# Patient Record
Sex: Male | Born: 1965
Health system: Southern US, Community
[De-identification: ages and names within clinical notes are randomized; demographics above are authoritative.]

## PROBLEM LIST (undated history)

## (undated) DIAGNOSIS — R0781 Pleurodynia: Secondary | ICD-10-CM

## (undated) DIAGNOSIS — IMO0002 Reserved for concepts with insufficient information to code with codable children: Secondary | ICD-10-CM

## (undated) DIAGNOSIS — K219 Gastro-esophageal reflux disease without esophagitis: Secondary | ICD-10-CM

## (undated) DIAGNOSIS — I82609 Acute embolism and thrombosis of unspecified veins of unspecified upper extremity: Secondary | ICD-10-CM

## (undated) DIAGNOSIS — I2510 Atherosclerotic heart disease of native coronary artery without angina pectoris: Secondary | ICD-10-CM

## (undated) DIAGNOSIS — G540 Brachial plexus disorders: Secondary | ICD-10-CM

## (undated) DIAGNOSIS — I82409 Acute embolism and thrombosis of unspecified deep veins of unspecified lower extremity: Secondary | ICD-10-CM

## (undated) HISTORY — PX: KNEE CARTILAGE SURGERY: SHX688

## (undated) HISTORY — PX: CAROTID ANGIOGRAM: SHX5765

## (undated) HISTORY — DX: Brachial plexus disorders: G54.0

## (undated) HISTORY — DX: Acute embolism and thrombosis of unspecified deep veins of unspecified lower extremity: I82.409

## (undated) HISTORY — DX: Reserved for concepts with insufficient information to code with codable children: IMO0002

## (undated) HISTORY — DX: Acute embolism and thrombosis of unspecified veins of unspecified upper extremity: I82.609

## (undated) HISTORY — DX: Gastro-esophageal reflux disease without esophagitis: K21.9

---

## 2004-06-12 ENCOUNTER — Emergency Department: Payer: Self-pay | Admitting: Internal Medicine

## 2004-09-01 ENCOUNTER — Emergency Department: Payer: Self-pay | Admitting: Emergency Medicine

## 2006-01-30 ENCOUNTER — Emergency Department: Payer: Self-pay | Admitting: Emergency Medicine

## 2006-12-03 ENCOUNTER — Emergency Department (HOSPITAL_COMMUNITY): Admission: EM | Admit: 2006-12-03 | Discharge: 2006-12-03 | Payer: Self-pay | Admitting: Emergency Medicine

## 2008-02-28 ENCOUNTER — Encounter
Admission: RE | Admit: 2008-02-28 | Discharge: 2008-02-29 | Payer: Self-pay | Admitting: Physical Medicine & Rehabilitation

## 2008-02-29 ENCOUNTER — Ambulatory Visit: Payer: Self-pay | Admitting: Physical Medicine & Rehabilitation

## 2008-07-01 ENCOUNTER — Encounter
Admission: RE | Admit: 2008-07-01 | Discharge: 2008-09-29 | Payer: Self-pay | Admitting: Physical Medicine & Rehabilitation

## 2008-07-02 ENCOUNTER — Ambulatory Visit: Payer: Self-pay | Admitting: Physical Medicine & Rehabilitation

## 2008-07-22 ENCOUNTER — Ambulatory Visit: Payer: Self-pay | Admitting: Physical Medicine & Rehabilitation

## 2008-07-31 ENCOUNTER — Ambulatory Visit: Payer: Self-pay | Admitting: Surgery

## 2008-08-07 ENCOUNTER — Ambulatory Visit: Payer: Self-pay | Admitting: Surgery

## 2008-08-15 ENCOUNTER — Ambulatory Visit: Payer: Self-pay | Admitting: Physical Medicine & Rehabilitation

## 2008-09-12 ENCOUNTER — Ambulatory Visit: Payer: Self-pay | Admitting: Physical Medicine & Rehabilitation

## 2008-10-03 ENCOUNTER — Encounter
Admission: RE | Admit: 2008-10-03 | Discharge: 2009-01-01 | Payer: Self-pay | Admitting: Physical Medicine & Rehabilitation

## 2008-10-07 ENCOUNTER — Ambulatory Visit: Payer: Self-pay | Admitting: Physical Medicine & Rehabilitation

## 2008-10-18 ENCOUNTER — Emergency Department: Payer: Self-pay | Admitting: Emergency Medicine

## 2008-10-22 ENCOUNTER — Encounter: Payer: Self-pay | Admitting: Physical Medicine & Rehabilitation

## 2008-10-25 ENCOUNTER — Emergency Department: Payer: Self-pay | Admitting: Emergency Medicine

## 2008-10-29 ENCOUNTER — Ambulatory Visit: Payer: Self-pay | Admitting: Physical Medicine & Rehabilitation

## 2008-10-30 ENCOUNTER — Ambulatory Visit: Payer: Self-pay | Admitting: Physical Medicine & Rehabilitation

## 2008-11-05 ENCOUNTER — Encounter: Payer: Self-pay | Admitting: Physical Medicine & Rehabilitation

## 2008-12-05 ENCOUNTER — Ambulatory Visit: Payer: Self-pay | Admitting: Physical Medicine & Rehabilitation

## 2008-12-12 ENCOUNTER — Encounter: Payer: Self-pay | Admitting: Physical Medicine & Rehabilitation

## 2008-12-16 ENCOUNTER — Ambulatory Visit: Payer: Self-pay | Admitting: Physical Medicine & Rehabilitation

## 2010-05-09 ENCOUNTER — Emergency Department: Payer: Self-pay | Admitting: Emergency Medicine

## 2010-06-07 HISTORY — PX: SCALENOTOMY W/O RESECTION CERVICAL RIB: SUR1288

## 2010-06-07 HISTORY — PX: OTHER SURGICAL HISTORY: SHX169

## 2010-08-31 ENCOUNTER — Ambulatory Visit: Payer: Self-pay | Admitting: Vascular Surgery

## 2010-10-20 NOTE — Procedures (Signed)
NAMECYNTHIA, STAINBACK             ACCOUNT NO.:  [id number]   MEDICAL RECORD NO.:  [id number]           PATIENT TYPE:   LOCATION:                                 FACILITY:   PHYSICIAN:  Erick Colace, M.D.DATE OF BIRTH:  07-Jul-1965   DATE OF PROCEDURE:  DATE OF DISCHARGE:                               OPERATIVE REPORT   PROCEDURE:  Left sacroiliac injection under fluoroscopic guidance.   INDICATIONS:  Left-sided buttock and low back pain.  Oswestry  questionnaire score is 66%.   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   Informed consent was obtained after describing risks and benefits of the  procedure with the patient.  These include bleeding, bruising, and  infection.  He is not on any antibiotics or any anticoagulants at the  current time.   The patient was placed prone on the fluoroscopy table.  Betadine prep,  sterile drape.  A 25-gauge 1-1/2-inch needle was used to anesthetize the  skin and subcutaneous tissue with 1% lidocaine x2 mL and a 25-gauge, 3-  inch spinal needle was inserted under fluoroscopic guidance, targeting  the left SI joint.  AP, lateral, and oblique images were utilized.  Omnipaque 180 under live fluoro demonstrated no intravascular uptake  with joint outline and inferior margin followed by injection of 1 mL of  2% MPF lidocaine and 1.5 mL of 40 mg/mL Depo-Medrol.  The patient  tolerated the procedure well.  Pre- and post-injection vitals were  stable.  Post-injection instructions were given.  Pre- and post-  injection pain level is stable.  We will send him up for repeat  injection in 2-3 weeks facet versus SI.      Erick Colace, M.D.  Electronically Signed     AEK/MEDQ  D:  07/22/2008 09:03:22  T:  07/22/2008 21:37:06  Job:  [id number]   cc:   Sharin Mons

## 2010-10-20 NOTE — Assessment & Plan Note (Signed)
A 45 year old male with date of work injury August 08, 2007.   The patient was initially referred by Dr. Sheran Luz and I saw him  initially on February 29, 2008.  Initial impression was sacroiliac  dysfunction and/or facet disorder.  He has undergone both sacroiliac  injection under fluoroscopic guidance as well as media-branch blocks to  block the lower facet joints in the left which were similarly unhelpful,  done on August 15, 2008.  He has been on sedentary work restriction.  We  gradually got up to 4 half days per week, and when I saw last visit I  did increase his tramadol but also increased him to 5 half days a week.  He states that since this occurred he does not have that extra day of  rest and his pain seems to be worse.  He is here today saying that his  pain is worse, and he is unable to work at the schedule that has been  prescribed.   He has had no new traumatic injuries.  He denies any falls.  He has had  no fevers or chills.   CURRENT MEDICATIONS:  Tramadol 2 p.o. t.i.d.   PHYSICAL EXAMINATION:  He is lying on exam the table, tends to not lie  on his left side.  He has negative straight leg raising test.  His  sensation is intact to light touch in bilateral lower extremities.  Deep  tendon reflexes are normal.  Knee and ankle show full range of motion  without pain or swelling.  Hip range of motion is painful with internal  and external rotation, mainly in the buttocks area rather than in the  groin or in the lateral hip area.  He has some mild tenderness to  palpation in the lateral hip area.  There is no evidence of joint  instability.  He ambulates with a cane favoring the left hip.   IMPRESSION:  Left buttocks pain.  Workup has been negative thus far.  He  has had an EMG which was negative for nerve compression.  He has had  negative response to facet medial-branch blocks and sacroiliac  injection.  He has not had any improvement after transforaminal epidural  injection done by Dr. Ethelene Hal.  He just had a shallow left posterolateral  disk on the left side, not causing any nerve root compression.   He had a bone scan on December 27, 2007, which was negative.  He has not had  any MRI of his hip.  I think really to complete the workup this would be  necessary to rule out AVN as a potential pain generator.   Since he was tolerating 4 half days per week, we will cut him back to  that while workup is pending.  If this imaging study is negative, we  would continue to recommend a multidisciplinary pain functional  restoration program and go back to 5x1/2 days progressing from there.   I discussed with the patient and his wife in agreement to continue his  current medications and give him Toradol injection today.  He is  requesting something for his pain.      Erick Colace, M.D.  Electronically Signed     AEK/MedQ  D:  10/07/2008 10:03:39  T:  10/07/2008 22:49:52  Job #:  [id number]   cc:   Sharin Mons, MD

## 2010-10-20 NOTE — Group Therapy Note (Signed)
ADDENDUM:  The patient's recommended work restrictions are sedentary job and 10-  pound lifting.  At this point, I think, 2-3 days per week with half days  would be at least a way to get him start at back in work.  I do not  think this would cause further damage to the spine.  He would benefit  from additional activity.  Discussed all the above findings with the  patient and case manager, Ms. Stoggett.   The patient is not MMI at the current time and I do not think the  medications prescribed would impair his ability or adjustment needed to  perform his job duty.   I have not scheduled a next office visit at the current time given that  this is a second opinion consultation and would not do so unless we  assume care.      Erick Colace, M.D.  Electronically Signed     AEK/MedQ  D:  02/29/2008 16:52:41  T:  03/01/2008 05:21:10  Job #:  [id number]

## 2010-10-20 NOTE — Assessment & Plan Note (Signed)
Justin [name] is a 45 year old male with a date of work injury August 08, 2007, claim #[id number].   The patient was initially referred by Dr. Sheran Luz.  I saw the  patient originally in September 2009.  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  He  felt a pop in the left side of his low back, and had pain since that  time.  [facility], MRI showed a shallow left  posterolateral disk at L4-5, not causing any foraminal stenosis or nerve  root compression.  The patient was evaluated by Dr. Ethelene Hal and then  evaluated by Dr. Shelle Iron from Orthopedic Surgery.  He went through  physical therapy recommendations for sedentary work duty.  EMG testing  per Dr. Clarisse Gouge. was normal in the lower extremities.  Bone scan ordered  on December 27, 2007, was normal.  Recommendations from Dr. Ethelene Hal'  [facility] as well as a neurosurgical second opinion.   Since he began treatment here, we continued his sedentary work  restrictions, but gradually gone up on his days, 4 half days a week.  He  has undergone left SI joint injection under fluoroscopic guidance which  had no impact on his pain.  He then underwent L5 dorsal ramus injection,  L4 medial branch, and L3 medial branch block which was also unhelpful  and it was performed on August 15, 2008.   His pain scores are still at a 7-8/10 level which is similar to his  baseline.  Sleep is poor.  Pain improves with rest and medication,  relieves with meds about 3/10.  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   REVIEW OF SYSTEMS:  Indicates he needs some assist with certain dressing  and bathing activities.  His review of systems is positive for tingling,  trouble walking, spasms, and tingling in his hip  area.   His blood pressure is 125/64, pulse 87, respirations 18, and O2 sat 98%  room air.  This is an obese male in no acute distress.  Orientation x3.  Affect is alert.  Gait is with a limp, uses a cane, favors left hip, he  has good range of motion in the left hip.  He has normal strength in  bilateral lower extremities.  Deep tendon reflexes are normal.  Muscle  bulk was normal.  He has tenderness over left PSIS area.  Sensation is  intact in the lower extremities.   IMPRESSION:  Left buttock pain, I do not think it is diskogenic, SI  mediated, or facet mediated.  This could be a chronic gluteal strain.  Certainly, his degree of disability is out of proportion to the injury.  I think he could benefit from a functional restoration or work  conditioning program to at least get him back to full days.  I think at  this point he can go one-half day a week.  I think at this point we will  continue his sedentary restrictions.  He is doing okay at 32 hours a  week, would recommend a multidisciplinary pain program given that his  seated posture is always his right buttocks, does not sit up straight,  seems to be functioning at a suboptimal level.  I really do need to see him anymore.  I did write an increased dose of  tramadol 2 p.o. t.i.d., although that the 50 t.i.d. dose did not seem to  be having much effect.  We will see how this does.  My impression is  that probably no  medications will be very helpful for this given that this is more likely  to be a myofascial type situation, I discussed with the patient.      Erick Colace, M.D.  Electronically Signed     AEK/MedQ  D:  09/12/2008 15:07:56  T:  09/13/2008 02:40:22  Job #:  [id number]   cc:   Sharin Mons  fax [phone]

## 2010-10-20 NOTE — Procedures (Signed)
NAMEHANIF, RADIN             ACCOUNT NO.:  [id number]   MEDICAL RECORD NO.:  [id number]           PATIENT TYPE:   LOCATION:                                 FACILITY:   PHYSICIAN:  Erick Colace, M.D.DATE OF BIRTH:  1965/11/15   DATE OF PROCEDURE:  DATE OF DISCHARGE:                               OPERATIVE REPORT   PROCEDURE:  Left L5 dorsal ramus injection, left L4 medial branch block,  and left L3 medial branch block under fluoroscopic guidance.   INDICATIONS:  Axial low back pain radiating to the left hip.  No  significant relief after SI joint injection.  Pain is only partially  relieved by medication management and other conservative care and  interferes with mobility.   Informed consent was obtained after describing risks and benefits of the  procedure with the patient.  These include bleeding, bruising,  infection.  He elects proceed and has given written consent.  The  patient placed prone on fluoroscopy table, Betadine prep, sterilely  draped, 25-gauge, 1-1/2-inch needle was used to anesthetize the skin and  subcu tissue 1% lidocaine x2 mL, and a 22-gauge, 3-1/2-inch spinal  needle was inserted under fluoroscopic guidance targeting left S1 SAP  sacral ala junction bone contact made, confirmed with lateral imaging.  Omnipaque 180 under live fluoro demonstrated no intravascular uptake and  0.5 mL of the solution containing 1 mL of  4 mg/mL dexamethasone and 2  mL of 2% MPF lidocaine.  Left L5 SAP transverse process junction  targeted bone contact made, confirmed with lateral imaging.  Omnipaque  180 x0.5 mL demonstrated no intravascular uptake then 0.5 mL of  dexamethasone lidocaine solution was injected.  Left L4 SAP transverse  process junction targeted bone contact made, confirmed with lateral  imaging.  Omnipaque 180 x0.5 mL demonstrated no intravascular uptake  then 0.5 mL dexamethasone lidocaine solution was injected.  The patient  tolerated procedure well.   Pre and post injection vitals stable.  Post  injection instructions given.      Erick Colace, M.D.  Electronically Signed     AEK/MEDQ  D:  08/15/2008 10:43:09  T:  08/15/2008 22:52:45  Job:  [id number]

## 2010-10-20 NOTE — Assessment & Plan Note (Signed)
Problem #[id number]   ADDENDUM   1. 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  2. Work status:  Continue sedentary work with one-half day 3 times a      week until I see him next.   The patient is not at MMI.   Office visit to be determined.  We will need to Workers' Comp approval  for sacroiliac injection on the left side.      Erick Colace, M.D.  Electronically Signed     AEK/MedQ  D:  07/02/2008 10:06:15  T:  07/03/2008 01:11:05  Job #:  [id number]   cc:   Clare Charon, M.D.  Fax: [phone]

## 2010-10-20 NOTE — Assessment & Plan Note (Signed)
Justin [name] returns today.  A 45 year old male with date of injury on  August 08, 2007, reports stretching injury, slipping of a forklift.  He  was last seen by me on December 06, 2008, at which time the goal is to can  back to work, half day a week, 5 days a week, and work up to full time.  He was sent for a functional capacity evaluation, which was performed on  December 12, 2008, and he was just not to be able to perform sedentary duty.  The patient was felt to be within normal limits in terms of self-  limiting behavior,reported 10/10 pain.  Poor body mechanics were noted.  Avoidance Beliefs Questionnaire 24/30 this indicates a fear of movement.  Oswestry 88%, which is consistent once we have had in this clinic.  This  is inconsistent with the pts' ability to get to work or to an MD  visit.He is able to occasionally sit to stand and walk, but really  nothing else.  This was not a very helpful FCE.   He continues on tramadol 50 mg 2 p.o. t.i.d.   He is currently working 4 hours a day, 4 days per week.  He is reporting  pain at 8/10.  Currently, has some numbness and tingling in side of  which has already been evaluated both with MRIs and with EMGs, which  have been normal.  Trouble walking spasms, depression, and  constipation.Marland Kitchen   PHYSICAL EXAMINATION:  VITAL SIGNS:  Blood pressure 133/74, pulse 84,  respirations 18, and O2 sat 98% on room air.  GENERAL:  Overweight male who sits on his right side.  EXTREMITIES:  He has normal strength in lower extremities.  Pain with  hip range of motion.  Also, tenderness over the gluteus muscle.  He has  mild tenderness over the greater trochanter on the left side and the  right side.  His extremities shows no evidence of edema.  Good skin  temperature.  No erythema.  Gait is forward flexed, used a cane.  No  foot drop.   IMPRESSION:  Chronic left lower extremity pain.  Negative workup,  basically appears to be a bursitis with some myofascial pain in the  gluteus medius area.  His pain does appear to be out of proportion to  underlying pathology.  He has been seen by psychology.  No cognitive  deficits seen, but pain psychology was recommended.  His true functional  status is difficult to quantify.  I have offered 2 options for the  patient; either a increasing pain medication and increasing his work  activity to see if this can improve his tolerance.  Will be a referral  to a multidisciplinary pain program, which I have recommended in the  past as well as has been recommended by Dr. Ethelene Hal.  He had a Physical  Therapy program approved by workers' comp, but nothing truly integrated.  Separately, he was seen by Psychology, once again this is not a  integrated program.   I really have nothing else to offer at this point other than continue  tramadol and continue him on a work restriction.  It is certainly  difficult to tell what is an exaggerated fear avoidance patterns versus  malingering, but once again feel that a multidisciplinary program would  be best at this point to help differentiate between these two issues.   I recommend a 1/2 day, 5 day per week schedule. I am concerned that the  pt may  non-compliant with this recommendation given he states he needs  extra rest.  Given the lack of pathology seen on diagnostic testing, I  don't think this should be a problem.  I will see pt only on a prn basis given I have nothing more to offer.     Justin [name], M.D.  Electronically Signed    AEK/MedQ  D:  12/16/2008 17:37:55  T:  12/17/2008 05:22:57  Job #:  [id number]   cc:   Nurse Case Manager Sharin Mons

## 2010-10-20 NOTE — Assessment & Plan Note (Signed)
This 45 year old male who had a date of injury on August 08, 2007, when he  reports falling from a forklift.  He slipped off tire and landed on the  ground, causing him to do a split.  He fell off on the left side low  back.  He has had pain since that time.  No numbness in the lower  extremities.  At first, he says that he may be having some numbness on  the inside of his thigh more currently.  He had an MRI ordered from the  [facility], lumbar spine without contrast on August 31, 2007,  shallow left posterior lateral disk without any foraminal stenosis or  nerve root impingement.  He has been in a forward-flexed posture since a  note that I reviewed from Dr. Ethelene Hal on September 18, 2007.  He has been seen  by Dr. Jillyn Hidden from Orthopedic Surgery and was recommended to have work a  sedentary duty.  EMG of left lower extremity, which was performed by Dr.  Clarisse Gouge on November 24, 2007, which was normal.  He has had a couple Medrol  Dosepak initiated as well as Designer, multimedia.  A multidisciplinary pain program  was recommended.  Bone scan was performed on December 27, 2007, which was  normal.  I saw him in a second opinion consultation on March 01, 2008.  My only addition in terms of his treatment program was trial of a  sacroiliac injection and facet injection if that failed as well as  agreeing with a reconditioning functional restoration program.  A  sacroiliac injection was performed July 22, 2008, which did not help  at all of his pain.  He was increased to four half days per week since  February of 2010.  He had medial branch blocks performed on August 15, 2008, once again no significant improvement.  We increased this to half  a day per week, 5 days a week for September 12, 2008 and at that point, he  felt like he was doing worse when he got back to five half days per  week.  I made a referral from multidisciplinary pain program at that  time because I really thought he was at a maximum medical  improvement  from what I can do for him and had no further improvement in functional  status may be gained through a functional restoration program.  He came  back to me stating that he was feeling worse again on Oct 07, 2008.  He  had no new trauma.  No new injuries.  He had no change in his  examination on Oct 07, 2008.   I reexamined him, no new problems were noted.  We did order MRI of his  hip to complete his workup, looking at maybe another potential pain  generator; although whether or not this would be a direct causation from  his work, difficult say.  However, he has had some steroids during the  course of the treatment.   He indicates his pain is 9 before.  Pain improves with rest, heat,  medications; relief from meds is little.  He walks 1-2 minutes at a time  he does not climb steps, he does not drive, he does sedentary type work,  he indicates some help needed for meal prep, household duties, shopping,  as well as some with dressing.   REVIEW OF SYSTEMS:  As per health and history form.   PHYSICAL EXAMINATION:  GENERAL:  Overweight male, in  no acute distress.  Orientation x3.  Affect alert.  VITAL SIGNS:  Blood pressure 133/77, pulse 100, respirations 18, O2 sat  97% room air.  EXTREMITIES:  Without edema.  His hip has some buttock pain with minimal  groin pain with internal external rotation of the hip.  He has no  evidence of joint instability.  He has no knee or ankle range of motion  deficits.  His lower extremity sensation is normal. Deep tendon reflexes  are normal in bilateral lower extremities.  His tenderness over the  buttocks and PSIS area on the left side only.   IMPRESSION:  Left buttocks pain, workup negative thus far.  He was not  able to tolerate MRI without sedation.  I wrote for some Valium.  At  this point, given the workup thus far, would not proceed on to a IV  sedation, if cannot do this with Valium.  I do think, he could continue  at four half  days a week at a sedentary work.  I do not think, the work  is making him any worse.  The patient is concerned that he has flare ups  of his pain.  He feels like he needs to be seen immediately for these.  He went to the [facility] last week.  X-rays negative for fracture in  the left hip and the back area, received some Percocet and Valium.   I do think, a functional restoration program will be helpful for him.  His diagnosis of this point is basically lumbago and unspecified hip  pain with a myofascial component to his pain.  He asked about using a  walker.  I really did not think this would really be indicated based on  diagnosis.   He asked whether he can be seen again at [facility]  and I have sent him back there to see if they would take over if he  wishes to transfers care there.  I will send him Dr. Ethelene Hal, Dr. Shelle Iron or  Shon Baton.  He may benefit from evaluation by Dr. Charlann Boxer or Alusio in regards  to his hip.      Erick Colace, M.D.  Electronically Signed     AEK/MedQ  D:  10/29/2008 16:34:32  T:  10/30/2008 09:54:28  Job #:  [id number]   cc:   Dr. Benny Lennert

## 2010-10-20 NOTE — Assessment & Plan Note (Signed)
A 45 year old male, day of injury August 08, 2007, reports falling from a  forklift, slipped off a trailer and landed on the ground causing him to  do the splits.  He has had left-sided low back and buttock pain since  that time.  He has had extensive workup as well as physician evaluation.  Lumbar spine MRI showed a shallow left posterolateral disk without  foraminal stenosis.  Spine Surgery evaluated no surgical lesion.  EMG of  left lower extremity performed by Dr. Clarisse Gouge on November 24, 2007, was  normal.  He has had bone scan on January 01, 2008, and normal.  I saw him  in a second opinion consultation after Dr. Ethelene Hal recommended a  multidisciplinary pain program.  I trialed him on sacroiliac injection  on July 22, 2008, which did not help at all.  He had medial branch  blocks performed on August 15, 2008, which did not help at all.  We  increased his worked to half day 5 days a week.  He had some worsening.  I recommended a multidisciplinary pain program, workers' comp, improved  physical therapy, and psychology separately.  He still has not had a  psychology eval but had to finish with physical therapy without  significant improvement.  I last saw him on Oct 29, 2008, ordered MRI of  his hip to complete his workup and this was normal only showing some  minimal degenerative changes not accounting for his pain.   He returns today for followup.   CURRENT MEDICATIONS:  Tramadol 50 mg 2 p.o. t.i.d.   PHYSICAL EXAMINATION:  GENERAL:  Overweight male in no acute distress.  Sits on his right buttock.  He has mild decreased hip internal and  external rotation on the right side.  No evidence of joint instability.  No knee or ankle range of motion deficits.  Straight leg raising test is  negative.  Deep tendon reflexes normal in bilateral extremities.  He has  tenderness over the buttocks and PSIS area on the left side and no  tenderness over the trochanteric bursa.  He has normal sensation in  lower extremity.   IMPRESSION:  Left buttocks pain.  Negative workup.  I think this is mild  myofascial in nature.  No new recommendations at this time.  I think he  should be able to get to work light duty half day a week 5 days a week  and workup to full.  We would like to get a functional capacity  evaluation to further delineate his permanent restrictions.  I will see  him back after the functional capacity evaluation.      Erick Colace, M.D.  Electronically Signed     AEK/MedQ  D:  12/05/2008 15:24:51  T:  12/06/2008 05:13:45  Job #:  [id number]

## 2010-10-20 NOTE — Assessment & Plan Note (Signed)
WORK INJURY:  August 08, 2007.   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  He has seen physicians at the  [facility].  MRI of the lumbar spine, left shallow posterior  lateral disk without foraminal stenosis, noted to have forward flexed  posture when seen by Dr. Ethelene Hal from [facility], Podiatry,  and then orthopedic surgical eval with Dr. Shelle Iron.  No surgical treatment  recommended.  Placed on Ultram as well as anti-inflammatories.  Injection options of epidural versus facet injections.  Sedentary due to  recommendations.  EMG left lower extremity per Dr. Clarisse Gouge, normal.  Second opinion, Neurosurgery, Dr. Marikay Alar.  No surgical treatment  recommended.  When I saw him last in September, overall opinion was not  disk related pain, sacroiliac versus facet with overlying myofascial  component.   In the interval time period, has undergone some treatment for  hemorrhoids due to constipation, had been on some Darvocet.  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   Pain score right now is 7-8/10 and interferes with activity at 10/10  level.  Sleep is poor.  Pain is increased with walking, bending,  standing.  On the positive side, he has increased his part-time work to  3 half days per week.  He needs some assist with meal prep, household  duties, and shopping.   His review of systems is positive for tingling, spasms, dizziness,  depression, constipation.   SOCIAL HISTORY:  Married.   PHYSICAL EXAMINATION:  VITAL SIGNS:  Blood pressure 135/66, pulse 66,  respirations 18, O2 sat 95% on room air.  GENERAL:  No acute stress.  Orientation x3.  Affect alert.  EXTREMITIES:  Gait is with a limp.   He is forward flexed at the hips.  He has tight hamstrings.  He has mild  pain over the  PSIS, but also pain in the lumbar paraspinal area on the  left side.  His sensory exam is normal in lower extremities.  Deep  tendon reflexes are normal.  Strength is normal.  Hip range of motion is  normal.  Knee and ankle range of motion are normal.  He is unable to  straighten out due to his back pain.   IMPRESSION:  1. Lumbar pain, work-related injury.  He would benefit from additional      investigation of pain generators including sacroiliac versus which      are with sacroiliac given his overall pain distribution and      exacerbating factors.  2. Continue current work restrictions of one-half day per week, 3      times per week, next month go up to 4 days a week.  Once we have      better idea in terms of pain generator, we would recommend another      course of physical therapy.  3. If we fail to make a more specific diagnosis in terms of pain      generator and we are unable to advance his activity any further, he      would benefit from multidisciplinary pain treatment program.   I have written for tramadol 50 mg t.i.d. today.  He is to stop his  propoxyphene.  Discussed with the patient and wife.  Case manager not  present today.      Erick Colace, M.D.  Electronically Signed     AEK/MedQ  D:  07/02/2008 10:03:13  T:  07/02/2008 23:29:52  Job #:  [id number]   cc:   Caralyn Guile. Ethelene Hal, M.D.  Fax: [phone]   Sharin Mons

## 2010-10-20 NOTE — Group Therapy Note (Signed)
This is a second opinion consultation.   CONSULT REQUESTED BY:  Dr. Ethelene Hal for the evaluation of low back pain.   CHIEF COMPLAINT:  Back pain, left hip pain, and spasms into the left  buttock and posterior thigh.   Onset of pain on August 08, 2007.  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  He parked the  vehicle and stepped out onto the tire with his right leg, which then  slipped off the tire and landed on the ground causing him to do split.  He felt the pop in the left side of his low back and has had pain since  that time.  He does not have any numbness in the lower extremity.  He  gets spasms in the left-sided low back and buttock.  He initially saw  the nurse at his place of employment but later seen at [facility].  One of the physicians at [facility] ordered an MRI of the lumbar  spine without contrast, which was performed on August 31, 2007.  I was  able to look at the films personally as well as review the MRI report  read by radiologist Frederica Kuster.  I agreed with the impression of  her shallow left posterolateral disk protrusion at L4-L5, which did not  cause any foraminal stenosis or any nerve root compression.  He then was  sent to Dr. Ethelene Hal and I reviewed this note from September 18, 2007.  The  patient at that point was in a forward-flexed position with tight  hamstrings but good hip range of motion, normal deep tendon reflexes.  He was referred to Orthopedic Surgery for surgical evaluation on October 03, 2007, with Dr. Jene Every.  Recommendation was for possible  epidural steroid injection versus facet injections and Ultram as well as  antiinflammatory.  He had another visit with Dr. Ethelene Hal on November 09, 2007.  Treatment options were discussed.  The patient elected not to proceed  with any type of injections.  He did continue with physical therapy and  recommendations for work sedentary duty as well as the EMG left  lower  extremity.  He returned to see Dr. Ethelene Hal on November 14, 2007, second opinion  mentioned at that time.  The patient also underwent EMG testing per Dr.  Clarisse Gouge on November 24, 2007.  There was a normal lower extremity  electrodiagnostic evaluation.  Another note from December 26, 2007, per Dr.  Ethelene Hal' Medrol Dosepak initiated, Darvocet initiated.  Second opinion,  neurosurgery Dr. Yetta Barre and if neurosurgery recommended multidisciplinary  pain clinic, the patient taken out of work from December 15, 2007, to January 01, 2008.   Bone scan ordered on December 27, 2007, performed at [facility],  normal study.  Note from Dr. Venora Maples January 02, 2008, once again  recommending neurosurgical second opinion and multidisciplinary pain  clinic.   The patient's current level of pain is 7/10, interferes with activity at  a 10/10 level, relationship with other people at 7/10.  The pain is  worse with daytime.  Pain is worse with walking, bending, sitting and  standing, improves with medication.  Relief from meds is fair.  He  reports being in bed most of the time, walking short distances.  He is  not driving because he states he cannot sits for long enough.  His  sitting tolerance is listed as he can only sit in his favorite chair as  long  as he likes, he has to sit in a certain position mainly on his  right hip.  He does not lift or carry anything.  His standing tolerance  is about 10 minutes.  His sleep is occasionally disturbed by pain.  He  travels in a car but his pain restricts the journey of less than 1 hour.  His Oswestry disability questionnaire score today is 64%.   His ADLs needs some assistance with dressing and bathing.   REVIEW OF SYSTEMS:  Positive for tingling, trouble walking, spasms.   PAST MEDICAL HISTORY:  Significant for knee surgery as well as migraine  headaches.   CURRENT MEDICATIONS:  1. Propoxyphene, he has 13 left, 100/650, per Dr. Ethelene Hal.  2. Treximet 85/500 once a day.  This is  the combination of Naprosyn      and sumatriptan.   He has had one ER visit for questionable seizure, although he has not  been treated with antiseizure meds in the past.  This was over one year  ago.   SOCIAL HISTORY:  Married, lives with his wife who accompanies him today.  She has been driving him around.   PHYSICAL EXAMINATION:  VITAL SIGNS:  Blood pressure 139/71, pulse 86,  respiratory rate is 18, and O2 sat 97% on room air.  GENERAL:  Well-developed, well-nourished male.  Affect is mildly  anxious.  Orientation x3.  Gait is forward flexed at the hips.  EXTREMITIES:  Show no evidence of edema in the upper and lower  extremity.  He has good pulses in the upper and lower extremities.  Coordination is normal in the upper and lower extremity.  Deep tendon  reflexes are 2+ bilateral in upper and lower extremities.  Sensation is  normal in bilateral upper and lower extremities.  His range of motion is  normal in bilateral upper extremities and lower extremities.  He has  some limitations in the hip, internal and external rotation, and has  some tightness at the hamstrings, which limits full knee extension.  Bilateral ankle range of motion is full.  Neck range of motion is full.  Lumbar spine range of motion, he has 75% forward flexion, however, on  extension he can barely get to neutral, and this is mainly when he is  lying down.  When he is standing up, he still gets forward flexed.  His  Faber's testing shows tightness of external rotation of the hips but  also some referral pain to the left sacroiliac region.   Palpation in the hip area revealed some tenderness to bilateral greater  trochanteris but more so in the left gluteus medius area as well as  along the left paraspinal facet joints.   IMPRESSION:  Left hip, low back, and buttock pain.  I do not think this  is discogenic for a number of reasons, first his disk protrusions rather  unremarkable in terms of size.   Additionally, I would expect that  forward-flexed position would increase pain due to discogenic disease.  Mechanism of injury could suggest either sacroiliac or facet, but given  vocational pain, I would first consider sacroiliac, this is most likely  pain generator.  Her certainly has overlying myofascial component to  pain as well as some hip contracture, which is likely due to relative  immobility.   I discussed with the patient's treatment options as well as what I think  may be causing this pain using a spine model.   I would recommend starting her with a  sacroiliac injection further  differentiate pain generator and consider facets if this is not helpful.  I would also start tramadol as extended release one per day 100 mg as  well as Flector patch in the low back area, changed every 12 hours.  Some point, he will need physical therapy when he is little bit more  comfortable as previous physical therapy with more along the lines of  passive modalities, no active exercise.  He may indeed end up in a  multidisciplinary pain program but this would, if he fails injection  therapy and other medication.   Thank you for this interesting consultation.      Erick Colace, M.D.  Electronically Signed     AEK/MedQ  D:  02/29/2008 16:50:20  T:  03/01/2008 05:05:36  Job #:  [id number]   cc:   Dr. Sharin Mons

## 2010-11-04 ENCOUNTER — Ambulatory Visit: Payer: Self-pay | Admitting: Vascular Surgery

## 2010-11-11 ENCOUNTER — Inpatient Hospital Stay: Payer: Self-pay | Admitting: Vascular Surgery

## 2010-11-16 LAB — PATHOLOGY REPORT

## 2010-11-27 ENCOUNTER — Other Ambulatory Visit: Payer: Self-pay | Admitting: Vascular Surgery

## 2010-12-30 ENCOUNTER — Encounter: Payer: Self-pay | Admitting: Vascular Surgery

## 2011-01-06 ENCOUNTER — Encounter: Payer: Self-pay | Admitting: Vascular Surgery

## 2011-02-06 ENCOUNTER — Encounter: Payer: Self-pay | Admitting: Vascular Surgery

## 2011-03-08 ENCOUNTER — Encounter: Payer: Self-pay | Admitting: Vascular Surgery

## 2011-03-24 LAB — DIFFERENTIAL
Basophils Absolute: 0
Basophils Relative: 0
Eosinophils Absolute: 0
Eosinophils Relative: 0
Lymphocytes Relative: 5 — ABNORMAL LOW
Lymphs Abs: 0.9
Monocytes Absolute: 1.3 — ABNORMAL HIGH
Monocytes Relative: 8
Neutro Abs: 15.3 — ABNORMAL HIGH
Neutrophils Relative %: 87 — ABNORMAL HIGH

## 2011-03-24 LAB — BASIC METABOLIC PANEL
BUN: 11
CO2: 25
Calcium: 8.8
Chloride: 103
Creatinine, Ser: 1.36
GFR calc Af Amer: >60
GFR calc non Af Amer: 58 — ABNORMAL LOW
Glucose, Bld: 142 — ABNORMAL HIGH
Potassium: 3.3 — ABNORMAL LOW
Sodium: 136

## 2011-03-24 LAB — CBC
HCT: 42.1
Hemoglobin: 14.4
MCHC: 34.2
MCV: 93.2
Platelets: 179
RBC: 4.52
RDW: 12.6
WBC: 17.5 — ABNORMAL HIGH

## 2011-04-08 ENCOUNTER — Encounter: Payer: Self-pay | Admitting: Vascular Surgery

## 2011-05-08 ENCOUNTER — Encounter: Payer: Self-pay | Admitting: Vascular Surgery

## 2011-06-08 ENCOUNTER — Encounter: Payer: Self-pay | Admitting: Vascular Surgery

## 2014-03-02 ENCOUNTER — Emergency Department: Payer: Self-pay | Admitting: Student

## 2014-03-02 LAB — URINALYSIS, COMPLETE
Bacteria: NONE SEEN
Bilirubin,UR: NEGATIVE
Blood: NEGATIVE
Glucose,UR: NEGATIVE mg/dL (ref 0–75)
Ketone: NEGATIVE
Leukocyte Esterase: NEGATIVE
Nitrite: NEGATIVE
Ph: 6 (ref 4.5–8.0)
Protein: NEGATIVE
RBC,UR: 2 /HPF (ref 0–5)
Specific Gravity: 1.018 (ref 1.003–1.030)
Squamous Epithelial: <1
WBC UR: <1 /HPF (ref 0–5)

## 2014-03-02 LAB — COMPREHENSIVE METABOLIC PANEL
Albumin: 3.6 g/dL (ref 3.4–5.0)
Alkaline Phosphatase: 78 U/L
Anion Gap: 3 — ABNORMAL LOW (ref 7–16)
BUN: 12 mg/dL (ref 7–18)
Bilirubin,Total: 0.4 mg/dL (ref 0.2–1.0)
Calcium, Total: 9.2 mg/dL (ref 8.5–10.1)
Chloride: 104 mmol/L (ref 98–107)
Co2: 30 mmol/L (ref 21–32)
Creatinine: 1.31 mg/dL — ABNORMAL HIGH (ref 0.60–1.30)
EGFR (African American): >60
EGFR (Non-African Amer.): >60
Glucose: 86 mg/dL (ref 65–99)
Osmolality: 273 (ref 275–301)
Potassium: 4.1 mmol/L (ref 3.5–5.1)
SGOT(AST): 28 U/L (ref 15–37)
SGPT (ALT): 34 U/L
Sodium: 137 mmol/L (ref 136–145)
Total Protein: 8.1 g/dL (ref 6.4–8.2)

## 2014-03-02 LAB — CBC WITH DIFFERENTIAL/PLATELET
Basophil #: 0.1 10*3/uL (ref 0.0–0.1)
Basophil %: 0.9 %
Eosinophil #: 0.2 10*3/uL (ref 0.0–0.7)
Eosinophil %: 2.7 %
HCT: 52.6 % — ABNORMAL HIGH (ref 40.0–52.0)
HGB: 16.5 g/dL (ref 13.0–18.0)
Lymphocyte #: 2.7 10*3/uL (ref 1.0–3.6)
Lymphocyte %: 29 %
MCH: 29.5 pg (ref 26.0–34.0)
MCHC: 31.5 g/dL — ABNORMAL LOW (ref 32.0–36.0)
MCV: 94 fL (ref 80–100)
Monocyte #: 0.9 x10 3/mm (ref 0.2–1.0)
Monocyte %: 9.9 %
Neutrophil #: 5.3 10*3/uL (ref 1.4–6.5)
Neutrophil %: 57.5 %
Platelet: 227 10*3/uL (ref 150–440)
RBC: 5.62 10*6/uL (ref 4.40–5.90)
RDW: 13.6 % (ref 11.5–14.5)
WBC: 9.2 10*3/uL (ref 3.8–10.6)

## 2014-03-02 LAB — LIPASE, BLOOD: Lipase: 90 U/L (ref 73–393)

## 2014-06-05 DIAGNOSIS — M5442 Lumbago with sciatica, left side: Secondary | ICD-10-CM | POA: Insufficient documentation

## 2014-08-28 ENCOUNTER — Ambulatory Visit: Payer: Self-pay | Admitting: Orthopedic Surgery

## 2014-09-02 ENCOUNTER — Emergency Department: Payer: Self-pay | Admitting: Emergency Medicine

## 2014-09-02 LAB — APTT: Activated PTT: 27.5 secs (ref 23.6–35.9)

## 2014-09-02 LAB — PROTIME-INR
INR: 1
Prothrombin Time: 13.5 secs

## 2014-09-02 LAB — CBC
HCT: 49.4 % (ref 40.0–52.0)
HGB: 15.9 g/dL (ref 13.0–18.0)
MCH: 29.8 pg (ref 26.0–34.0)
MCHC: 32.2 g/dL (ref 32.0–36.0)
MCV: 93 fL (ref 80–100)
Platelet: 192 10*3/uL (ref 150–440)
RBC: 5.33 10*6/uL (ref 4.40–5.90)
RDW: 13.1 % (ref 11.5–14.5)
WBC: 9.3 10*3/uL (ref 3.8–10.6)

## 2014-09-02 LAB — COMPREHENSIVE METABOLIC PANEL
Albumin: 3.9 g/dL
Alkaline Phosphatase: 73 U/L
Anion Gap: 8 (ref 7–16)
BUN: 16 mg/dL
Bilirubin,Total: 0.5 mg/dL
Calcium, Total: 9.2 mg/dL
Chloride: 102 mmol/L
Co2: 28 mmol/L
Creatinine: 1.22 mg/dL
EGFR (African American): >60
EGFR (Non-African Amer.): >60
Glucose: 90 mg/dL
Potassium: 4.2 mmol/L
SGOT(AST): 20 U/L
SGPT (ALT): 26 U/L
Sodium: 138 mmol/L
Total Protein: 7.5 g/dL

## 2014-10-02 ENCOUNTER — Ambulatory Visit (INDEPENDENT_AMBULATORY_CARE_PROVIDER_SITE_OTHER): Payer: 59 | Admitting: Cardiology

## 2014-10-02 ENCOUNTER — Encounter: Payer: Self-pay | Admitting: *Deleted

## 2014-10-02 ENCOUNTER — Encounter: Payer: Self-pay | Admitting: Cardiology

## 2014-10-02 ENCOUNTER — Encounter (INDEPENDENT_AMBULATORY_CARE_PROVIDER_SITE_OTHER): Payer: Self-pay

## 2014-10-02 VITALS — BP 128/78 | HR 69 | Ht 71.5 in | Wt 252.5 lb

## 2014-10-02 DIAGNOSIS — Z86718 Personal history of other venous thrombosis and embolism: Secondary | ICD-10-CM

## 2014-10-02 DIAGNOSIS — R0789 Other chest pain: Secondary | ICD-10-CM | POA: Diagnosis not present

## 2014-10-02 DIAGNOSIS — R9431 Abnormal electrocardiogram [ECG] [EKG]: Secondary | ICD-10-CM

## 2014-10-02 NOTE — Progress Notes (Signed)
PATIENT: Justin [name] MRN: [id number] DOB: June 28, 1965 PCP: LADA, Satira Anis, MD  Clinic Note: Chief Complaint  Patient presents with  . other    F/u from [facility] due to blood clot in leg. Pt mentioned having chest pain about 1 month before blodd clot. Meds reviewed verbally with pt.  . Abnormal ECG    HPI: Justin [name] is a 49 y.o. male with a PMH below who presents today for evaluation abnormal EKG. He does not have any significant cardiac risk factors such as diabetes or hyperlipidemia. His only medications is Xarelto  He has a distant is a history of Right Upper Extremity Thoracic Outlet Syndrome with a extensive DVT history in the right upper extremity and initially had a cervical rib removal af ter recurrent thrombus. That was in 2012. He also has a history of right knee surgery with a torn meniscus he cracked patella 2000- 2002.Justin [name]   He was recently Found have a spontaneous right lower extremity DVT(March 2016) and has been treated by [facility] with Xarelto. He was also referred to a hematologist to evaluate possible hypercoagulable state.  As part of his evaluation he had an EKG performed by his primary doctor showing sinus bradycardia complete left bundle branch block with poor progression suggestive of possible anterior infarct. Knees and referred for cardiac evaluation for possible prior infarct. On my review EKG there is indeed orally progression is likely related to lead placement.  Apparently he had an episode of prolonged left-sided aching chest pain lasting maybe 4 hours that was about 6 weeks prior to me diagnosed with his DVT. He denied any associated dyspnea. This occurred at rest it was not made worse with exertion. He has never had another episode of chest pain since.  Interval History: as part of his evaluation he is now referred for cardiac evaluation. It is -10 he did his DVT is being managed, and the main issue is the episode of chest discomfort and the  abnormal EKG. The patient has not had any further episode of chest tightness pressure with rest or exertion. He walks with this right leg because of his knee surgery and is therefore not as active as he would like to be but he is still somewhat active without any resting or exertional chest tightness or pressure. He denies any PND, orthopnea, or edema with the exception of mild swelling the right leg. No rapid or irregular heartbeat/palpitations. No TIAs /amaurosis fugax or syncope/ near syncope. He has no bleeding issues with Xarelto with no melena, hematochezia hematuria, epistaxis or significant bruising.  Past Medical History  Diagnosis Date  . Arm vein blood clot     right  . Vascular thoracic outlet syndrome     Right   . DVT (deep venous thrombosis) 2012; January 2016    History of right arm; now right lower shotty    Prior Cardiac Evaluation and Past Surgical History: Past Surgical History  Procedure Laterality Date  . Knee cartilage surgery      Performed for cracked patella and torn meniscus  . Carotid angiogram    . Scalenotomy w/o resection cervical rib  2012    4 right-sided thoracic outlet syndrome  . Right upper extremity thrombectomy  2012    No Known Allergies  Current Outpatient Prescriptions  Medication Sig Dispense Refill  . XARELTO 20 MG TABS tablet Take 20 mg by mouth daily with supper.      No current facility-administered medications for this visit.  History   Social History Narrative   He works full-time at [employer].    He is married. Nonsmoker who is not currently. Alcohol.    family history includes Breast cancer in his father; Diabetes in his father; Heart attack in his father; Heart disease in his father.  ROS: A comprehensive Review of Systems - was performed Review of Systems  Constitutional: Negative.   HENT: Negative.   Respiratory: Negative for cough, shortness of breath and wheezing.   Cardiovascular: Negative.        Per HPI - 1  episode of chest pain in ~Jan  Gastrointestinal: Negative for heartburn.  Genitourinary: Negative.   Musculoskeletal: Positive for joint pain (R knee - superior patellar tendone area).  Neurological: Positive for focal weakness. Negative for dizziness and loss of consciousness.  Endo/Heme/Allergies: Negative for environmental allergies. Does not bruise/bleed easily.  Psychiatric/Behavioral: Negative for depression.  All other systems reviewed and are negative.  PHYSICAL EXAM BP 128/78 mmHg  Pulse 69  Ht 5' 11.5" (1.816 m)  Wt 252 lb 8 oz (114.533 kg)  BMI 34.73 kg/m2 General appearance: alert, cooperative, appears stated age, no distress, mildly obese and Well-nourished and well-groomed. Neck: no adenopathy, no carotid bruit, no JVD, supple, symmetrical, trachea midline and thyroid not enlarged, symmetric, no tenderness/mass/nodules Lungs: clear to auscultation bilaterally, normal percussion bilaterally and Nonlabored with good air movement Heart: regular rate and rhythm, S1, S2 normal, no murmur, click, rub or gallop and normal apical impulse Abdomen: soft, non-tender; bowel sounds normal; no masses,  no organomegaly Extremities: extremities normal, atraumatic, no cyanosis or edema, no edema, redness or tenderness in the calves or thighs and Postsurgical scars in the right knee will healed. Mild swelling below the knee Pulses: 2+ and symmetric Skin: Skin color, texture, turgor normal. No rashes or lesions Neurologic: Mental status: Alert, oriented, thought content appropriate, affect: normal and Pleasant Cranial nerves: normal   Adult ECG Reports #1: [facility]:  Rate: a62 ;  Rhythm: normal sinus rhythm and sinus arrhythmia, incomplete RBBB with diffuse nonspecific T-wave abnormality.  QRS Axis: -14 ;  PR Interval: 148 ;  QRS Duration: 96 ; QTc: 407;  Voltages: normal  Narrative Interpretation: mildly abnormal EKG with no obvious ischemic changes.  #2: PCP:  Rate: 56 ;  Rhythm: sinus  bradycardia, incomplete right bundle branch block, possible LAA. Poor anterior R-wave progression (may be secondary to conduction defect versus old anterior infarct),  QRS Axis:  -12 ;  PR Interval: 146 ;  QRS Duration: 94 ; QTc: 364; borderline low voltage  Narrative Interpretation: relatively normal EKG with no acute ischemic findings. (probably progression is a very nonspecific suggestion of possible MI)  #3: Here:  Rate: 69 ;  Rhythm: normal sinus rhythm  QRS Axis: 26 ;  PR Interval: 144 ;  QRS Duration: 88 ; QTc: 355;  Voltages: borderline low Narrative Interpretation: again mildly abnormal EKG with otherwise essentially normal. Incomplete right bundle branch block not as apparent. Lateral T-wave changes also not noted.  Recent Labs: from PCP 4/13/ 2016  Na+ 140, K+ 4.6, Cl- 99, HCO3- 24 , BUN 11, Cr 1.33, Glu 98, Ca2+ 9.5; AST 26, ALT 43 AlkP 83, Alb 4.1, TP 7.0, T Bili 0.3  CBC: W 11, H/H 15.5/46.3, Plt 254  TC 217, TG 158, HDL 68, LDL 117  ASSESSMENT / PLAN: Cecilie Lowers is a gentleman with history of thoracic outlet syndrome and now a new DVT being evaluated for hypercoagulable workup. He also had a mildly abnormal  EKG and one episode of prolonged chest pain that is somewhat atypical for cardiac symptom..  Problem List Items Addressed This Visit    Abnormal finding on EKG    I think the main concern was the nonspecific ST-T changes in the possible suggestion of possible anterior MI. In a patient who has had a prolonged episode chest pain and has had some thromboembolic disease, nonreusable to evaluate for possible prior MI. Plan: 2-D echocardiogram to assess for any wall motion abnormality. Since he is not having any more symptoms, no pigmented to do a stress test. I would prefer to have them do a treadmill GXT, however with his knee pain he would not likely be able to walk a treadmill. I don't think we need to do a nuclear stress test unless he has more pain or if the echo shows wall  motion abnormality.      Atypical chest pain    Prolonged episode of chest pain is somewhat atypical in nature. His left side and almost midaxillary and lasted several hours. He was not made worse with exertion and was made worse with deep inspiration. Probably musculoskeletal in nature, however with an EKG suggesting possible prior infarct it is not unreasonable to at least assess for wall motion abnormality.      Relevant Orders   EKG 12-Lead (Completed)   2D Echocardiogram without contrast    Other Visit Diagnoses    Hx of blood clots    -  Primary    Relevant Orders    EKG 12-Lead (Completed)       Meds ordered this encounter  Medications  . XARELTO 20 MG TABS tablet    Sig: Take 20 mg by mouth daily with supper.     Followup: 1-2 months  Lacey Wallman W. Ellyn Hack, M.D., M.S. Interventional Cardiolgy [facility]

## 2014-10-02 NOTE — Patient Instructions (Signed)
Medication Instructions: - no changes  Labwork: - none  Procedures/Testing: - Your physician has requested that you have an echocardiogram. Echocardiography is a painless test that uses sound waves to create images of your heart. It provides your doctor with information about the size and shape of your heart and how well your heart's chambers and valves are working. This procedure takes approximately one hour. There are no restrictions for this procedure.  Follow-Up: - Your physician recommends that you schedule a follow-up appointment in: 1 month with Dr. Ellyn Hack.  Any Additional Special Instructions Will Be Listed Below (If Applicable). - none

## 2014-10-04 ENCOUNTER — Encounter: Payer: Self-pay | Admitting: Cardiology

## 2014-10-04 DIAGNOSIS — R0789 Other chest pain: Secondary | ICD-10-CM | POA: Insufficient documentation

## 2014-10-04 DIAGNOSIS — R9431 Abnormal electrocardiogram [ECG] [EKG]: Secondary | ICD-10-CM | POA: Insufficient documentation

## 2014-10-04 NOTE — Assessment & Plan Note (Signed)
I think the main concern was the nonspecific ST-T changes in the possible suggestion of possible anterior MI. In a patient who has had a prolonged episode chest pain and has had some thromboembolic disease, nonreusable to evaluate for possible prior MI. Plan: 2-D echocardiogram to assess for any wall motion abnormality. Since he is not having any more symptoms, no pigmented to do a stress test. I would prefer to have them do a treadmill GXT, however with his knee pain he would not likely be able to walk a treadmill. I don't think we need to do a nuclear stress test unless he has more pain or if the echo shows wall motion abnormality.

## 2014-10-04 NOTE — Assessment & Plan Note (Signed)
Prolonged episode of chest pain is somewhat atypical in nature. His left side and almost midaxillary and lasted several hours. He was not made worse with exertion and was made worse with deep inspiration. Probably musculoskeletal in nature, however with an EKG suggesting possible prior infarct it is not unreasonable to at least assess for wall motion abnormality.

## 2014-10-08 ENCOUNTER — Other Ambulatory Visit: Payer: Self-pay | Admitting: Cardiology

## 2014-10-08 DIAGNOSIS — R0789 Other chest pain: Secondary | ICD-10-CM

## 2014-10-29 ENCOUNTER — Ambulatory Visit (INDEPENDENT_AMBULATORY_CARE_PROVIDER_SITE_OTHER): Payer: 59

## 2014-10-29 ENCOUNTER — Other Ambulatory Visit: Payer: Self-pay

## 2014-10-29 DIAGNOSIS — R0789 Other chest pain: Secondary | ICD-10-CM | POA: Diagnosis not present

## 2014-11-05 NOTE — Progress Notes (Signed)
Quick Note:  Echo results: Good news: Essentially normal echocardiogram and normal pump function and normal valve function. No signs to suggest heart attack.. EF: 60-65%. No regional wall motion abnormalities  DH  ______

## 2014-11-13 ENCOUNTER — Ambulatory Visit: Payer: 59 | Admitting: Cardiology

## 2015-09-21 ENCOUNTER — Encounter: Payer: Self-pay | Admitting: Emergency Medicine

## 2015-09-21 ENCOUNTER — Emergency Department
Admission: EM | Admit: 2015-09-21 | Discharge: 2015-09-21 | Disposition: A | Discharge status: Home / Self Care | Payer: 59 | Attending: Emergency Medicine | Admitting: Emergency Medicine

## 2015-09-21 DIAGNOSIS — J029 Acute pharyngitis, unspecified: Secondary | ICD-10-CM | POA: Diagnosis present

## 2015-09-21 DIAGNOSIS — Z7901 Long term (current) use of anticoagulants: Secondary | ICD-10-CM | POA: Diagnosis not present

## 2015-09-21 DIAGNOSIS — J111 Influenza due to unidentified influenza virus with other respiratory manifestations: Secondary | ICD-10-CM | POA: Diagnosis not present

## 2015-09-21 DIAGNOSIS — J101 Influenza due to other identified influenza virus with other respiratory manifestations: Secondary | ICD-10-CM

## 2015-09-21 DIAGNOSIS — I82401 Acute embolism and thrombosis of unspecified deep veins of right lower extremity: Secondary | ICD-10-CM | POA: Diagnosis not present

## 2015-09-21 LAB — RAPID INFLUENZA A&B ANTIGENS
Influenza A (ARMC): NEGATIVE
Influenza B (ARMC): POSITIVE — AB

## 2015-09-21 LAB — POCT RAPID STREP A: Streptococcus, Group A Screen (Direct): NEGATIVE

## 2015-09-21 MED ORDER — ACETAMINOPHEN 325 MG PO TABS
ORAL_TABLET | ORAL | Status: AC
  Filled 2015-09-21: qty 2

## 2015-09-21 MED ORDER — HYDROCOD POLST-CPM POLST ER 10-8 MG/5ML PO SUER: 5.0000 mL | Freq: Two times a day (BID) | ORAL | Status: DC | PRN

## 2015-09-21 MED ORDER — ACETAMINOPHEN 325 MG PO TABS
650.0000 mg | ORAL_TABLET | Freq: Once | ORAL | Status: AC | PRN
  Administered 2015-09-21: 650 mg via ORAL

## 2015-09-21 MED ORDER — OSELTAMIVIR PHOSPHATE 75 MG PO CAPS: 75.0000 mg | ORAL_CAPSULE | Freq: Two times a day (BID) | ORAL | Status: DC

## 2015-09-21 NOTE — ED Provider Notes (Signed)
[facility] Emergency Department Provider Note ____________________________________________  Time seen: Approximately 12:21 PM  I have reviewed the triage vital signs and the nursing notes.   HISTORY  Chief Complaint Sore Throat   HPI Justin [name] is a 50 y.o. male is here with complaint of sore throat 2 days. Patient states he also began having body aches, fever, and coughing. He has been taking some over-the-counter medication for his fever. He denies any vomiting or diarrhea. He is unaware of any exposure to strep but states everyone at work he is coughing and is sick. Wife states that his temperature was as high as the 102. He denies any UTI symptoms. Currently patient rates his pain as a 7 out of 10.   Past Medical History  Diagnosis Date  . Arm vein blood clot     right  . Vascular thoracic outlet syndrome     Right   . DVT (deep venous thrombosis) ([place]) 2012; January 2016    History of right arm; now right lower shotty    Patient Active Problem List   Diagnosis Date Noted  . Abnormal finding on EKG 10/04/2014  . Atypical chest pain 10/04/2014    Past Surgical History  Procedure Laterality Date  . Knee cartilage surgery      Performed for cracked patella and torn meniscus  . Carotid angiogram    . Scalenotomy w/o resection cervical rib  2012    4 right-sided thoracic outlet syndrome  . Right upper extremity thrombectomy  2012    Current Outpatient Rx  Name  Route  Sig  Dispense  Refill  . chlorpheniramine-HYDROcodone (TUSSIONEX PENNKINETIC ER) 10-8 MG/5ML SUER   Oral   Take 5 mLs by mouth every 12 (twelve) hours as needed for cough.   140 mL   0   . oseltamivir (TAMIFLU) 75 MG capsule   Oral   Take 1 capsule (75 mg total) by mouth 2 (two) times daily.   10 capsule   0   . XARELTO 20 MG TABS tablet   Oral   Take 20 mg by mouth daily with supper.            Dispense as written.     Allergies Review of patient's  allergies indicates no known allergies.  Family History  Problem Relation Age of Onset  . Heart disease Father   . Breast cancer Father   . Diabetes Father   . Heart attack Father     Social History Social History  Substance Use Topics  . Smoking status: Never Smoker   . Smokeless tobacco: None  . Alcohol Use: No    Review of Systems Constitutional: Positive fever/positive chills Eyes: No visual changes. ENT: Positive sore throat. Cardiovascular: Denies chest pain. Respiratory: Denies shortness of breath. Positive cough Gastrointestinal: No abdominal pain.  No nausea, no vomiting.  No diarrhea.   Genitourinary: Negative for dysuria. Musculoskeletal: Negative for back pain. Positive generalized body aches. Skin: Negative for rash. Neurological: Negative for headaches, focal weakness or numbness.  10-point ROS otherwise negative.  ____________________________________________   PHYSICAL EXAM:  VITAL SIGNS: ED Triage Vitals  Enc Vitals Group     BP 09/21/15 1127 103/69 mmHg     Pulse Rate 09/21/15 1127 89     Resp 09/21/15 1127 18     Temp 09/21/15 1127 100.6 F (38.1 C)     Temp Source 09/21/15 1127 Oral     SpO2 09/21/15 1127 94 %  Weight 09/21/15 1127 240 lb (108.863 kg)     Height 09/21/15 1127 5\' 11"  (1.803 m)     Head Cir --      Peak Flow --      Pain Score 09/21/15 1127 7     Pain Loc --      Pain Edu? --      Excl. in [place]? --     Constitutional: Alert and oriented. Well appearing and in no acute distress. Eyes: Conjunctivae are normal. PERRL. EOMI. Head: Atraumatic. Nose: No congestion/rhinnorhea.   EACs and TMs clear bilaterally. Mouth/Throat: Mucous membranes are moist.  Oropharynx non-erythematous. Neck: No stridor.  Supple Hematological/Lymphatic/Immunilogical: No cervical lymphadenopathy. Cardiovascular: Normal rate, regular rhythm. Grossly normal heart sounds.  Good peripheral circulation. Respiratory: Normal respiratory effort.  No  retractions. Lungs CTAB. Musculoskeletal: Moves upper and lower extremities without any difficulty and normal gait was noted. Neurologic:  Normal speech and language. No gross focal neurologic deficits are appreciated. No gait instability. Skin:  Skin is warm, dry and intact. No rash noted. Psychiatric: Mood and affect are normal. Speech and behavior are normal.  ____________________________________________   LABS (all labs ordered are listed, but only abnormal results are displayed)  Labs Reviewed  RAPID INFLUENZA A&B ANTIGENS ([facility] ONLY) - Abnormal; Notable for the following:    Influenza B ([facility]) POSITIVE (*)    All other components within normal limits  CULTURE, GROUP A STREP [facility])  POCT RAPID STREP A    PROCEDURES  Procedure(s) performed: None  Critical Care performed: No  ____________________________________________   INITIAL IMPRESSION / ASSESSMENT AND PLAN / ED COURSE  Pertinent labs & imaging results that were available during my care of the patient were reviewed by me and considered in my medical decision making (see chart for details).  Patient started on Tamiflu for 5 days along with Tussionex as needed for cough. Patient is encouraged to drink plenty of fluids and also take Tylenol or ibuprofen as needed for body aches. He is also given a note to remain out of work. ____________________________________________   FINAL CLINICAL IMPRESSION(S) / ED DIAGNOSES  Final diagnoses:  Influenza B      Johnn Hai, PA-C 09/21/15 1426

## 2015-09-21 NOTE — ED Notes (Signed)
Sore throat, body aches, fever

## 2015-09-21 NOTE — ED Notes (Signed)
Sore throat since Friday  Now having body aches and cough which started yesterday  Low grade fever on arrival to ED

## 2015-09-21 NOTE — Discharge Instructions (Signed)
Influenza, Adult Influenza (flu) is an infection in the mouth, nose, and throat (respiratory tract) caused by a virus. The flu can make you feel very ill. Influenza spreads easily from person to person (contagious).  HOME CARE   Only take medicines as told by your doctor.  Use a cool mist humidifier to make breathing easier.  Get plenty of rest until your fever goes away. This usually takes 3 to 4 days.  Drink enough fluids to keep your pee (urine) clear or pale yellow.  Cover your mouth and nose when you cough or sneeze.  Wash your hands well to avoid spreading the flu.  Stay home from work or school until your fever has been gone for at least 1 full day.  Get a flu shot every year. GET HELP RIGHT AWAY IF:   You have trouble breathing or feel short of breath.  Your skin or nails turn blue.  You have severe neck pain or stiffness.  You have a severe headache, facial pain, or earache.  Your fever gets worse or keeps coming back.  You feel sick to your stomach (nauseous), throw up (vomit), or have watery poop (diarrhea).  You have chest pain.  You have a deep cough that gets worse, or you cough up more thick spit (mucus). MAKE SURE YOU:   Understand these instructions.  Will watch your condition.  Will get help right away if you are not doing well or get worse.   This information is not intended to replace advice given to you by your health care provider. Make sure you discuss any questions you have with your health care provider.   Document Released: 03/02/2008 Document Revised: 06/14/2014 Document Reviewed: 08/23/2011 Elsevier Interactive Patient Education 2016 [place] taking Tamiflu as directed. Tylenol or ibuprofen as needed for fever and body aches. Drink lots of fluids to stay hydrated. Follow-up with your primary care doctor if any continued problems. Tussionex if needed for cough Work note was given.

## 2015-09-23 LAB — CULTURE, GROUP A STREP (THRC)

## 2015-10-01 ENCOUNTER — Other Ambulatory Visit: Payer: Self-pay | Admitting: Unknown Physician Specialty

## 2015-10-01 DIAGNOSIS — R42 Dizziness and giddiness: Secondary | ICD-10-CM

## 2015-10-02 ENCOUNTER — Ambulatory Visit
Admission: RE | Admit: 2015-10-02 | Discharge: 2015-10-02 | Disposition: A | Discharge status: Home / Self Care | Payer: 59 | Source: Ambulatory Visit | Attending: Unknown Physician Specialty | Admitting: Unknown Physician Specialty

## 2015-10-02 ENCOUNTER — Ambulatory Visit: Payer: 59

## 2015-10-02 DIAGNOSIS — R42 Dizziness and giddiness: Secondary | ICD-10-CM | POA: Insufficient documentation

## 2015-10-02 MED ORDER — GADOBENATE DIMEGLUMINE 529 MG/ML IV SOLN
20.0000 mL | Freq: Once | INTRAVENOUS | Status: AC | PRN
  Administered 2015-10-02: 20 mL via INTRAVENOUS

## 2016-03-23 ENCOUNTER — Ambulatory Visit
Admission: EM | Admit: 2016-03-23 | Discharge: 2016-03-23 | Disposition: A | Discharge status: Home / Self Care | Payer: 59 | Attending: Family Medicine | Admitting: Family Medicine

## 2016-03-23 DIAGNOSIS — M79632 Pain in left forearm: Secondary | ICD-10-CM | POA: Diagnosis not present

## 2016-03-23 DIAGNOSIS — L03114 Cellulitis of left upper limb: Secondary | ICD-10-CM

## 2016-03-23 DIAGNOSIS — Z0189 Encounter for other specified special examinations: Secondary | ICD-10-CM

## 2016-03-23 MED ORDER — CEFAZOLIN SODIUM 1 G IJ SOLR
1.0000 g | Freq: Once | INTRAMUSCULAR | Status: AC
  Administered 2016-03-23: 1 g via INTRAMUSCULAR

## 2016-03-23 MED ORDER — SULFAMETHOXAZOLE-TRIMETHOPRIM 800-160 MG PO TABS: 1.0000 | ORAL_TABLET | Freq: Two times a day (BID) | ORAL | 0 refills | Status: DC

## 2016-03-23 NOTE — ED Triage Notes (Addendum)
Patient complains of left arm pain and swelling. Patient states that it felt like something pinched him on his left arm and started itching. Patient states that area is painful. Patient states that this occurred last night while he was at work around [place].

## 2016-03-23 NOTE — ED Provider Notes (Addendum)
[facility]    CSN: [id number] Arrival date & time: 03/23/16  1236     History   Chief Complaint Chief Complaint  Patient presents with  . Arm Pain    HPI Justin [name] is a 50 y.o. male.   50 yo male presents with a complaint of left forearm pain, redness and swelling since last night, after feeling something sting or bite him on the forearm while at work. 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 States he looked around the machine to see if there was a spider or some other insect but did not see one. Area started swelling more and hurting more in the evening.    The history is provided by the patient.  Arm Pain     Past Medical History:  Diagnosis Date  . Arm vein blood clot    right  . DVT (deep venous thrombosis) ([place]) 2012; January 2016   History of right arm; now right lower shotty  . Vascular thoracic outlet syndrome    Right     Patient Active Problem List   Diagnosis Date Noted  . Abnormal finding on EKG 10/04/2014  . Atypical chest pain 10/04/2014    Past Surgical History:  Procedure Laterality Date  . CAROTID ANGIOGRAM    . KNEE CARTILAGE SURGERY     Performed for cracked patella and torn meniscus  . Right upper extremity thrombectomy  2012  . SCALENOTOMY W/O RESECTION CERVICAL RIB  2012   4 right-sided thoracic outlet syndrome       Home Medications    Prior to Admission medications   Medication Sig Start Date End Date Taking? Authorizing Provider  meclizine (ANTIVERT) 25 MG tablet Take 25 mg by mouth 3 (three) times daily as needed for dizziness.   Yes Historical Provider, MD  chlorpheniramine-HYDROcodone (TUSSIONEX PENNKINETIC ER) 10-8 MG/5ML SUER Take 5 mLs by mouth every 12 (twelve) hours as needed for cough. 09/21/15   Johnn Hai, PA-C  oseltamivir (TAMIFLU) 75 MG capsule Take 1 capsule (75 mg total) by mouth 2 (two) times daily. 09/21/15   Johnn Hai,  PA-C  sulfamethoxazole-trimethoprim (BACTRIM DS,SEPTRA DS) 800-160 MG tablet Take 1 tablet by mouth 2 (two) times daily. 03/23/16   Norval Gable, MD  XARELTO 20 MG TABS tablet Take 20 mg by mouth daily with supper.  09/24/14   Historical Provider, MD    Family History Family History  Problem Relation Age of Onset  . Heart disease Father   . Breast cancer Father   . Diabetes Father   . Heart attack Father     Social History Social History  Substance Use Topics  . Smoking status: Never Smoker  . Smokeless tobacco: Never Used  . Alcohol use No     Allergies   Review of patient's allergies indicates no known allergies.   Review of Systems Review of Systems   Physical Exam Triage Vital Signs ED Triage Vitals  Enc Vitals Group     BP 03/23/16 1316 121/75     Pulse Rate 03/23/16 1316 64     Resp 03/23/16 1316 17     Temp 03/23/16 1316 97.8 F (36.6 C)     Temp Source 03/23/16 1316 Oral     SpO2 03/23/16 1316 100 %     Weight 03/23/16 1316 246 lb (111.6 kg)     Height 03/23/16 1316 5\' 11"  (1.803 m)  Head Circumference --      Peak Flow --      Pain Score 03/23/16 1319 10     Pain Loc --      Pain Edu? --      Excl. in [place]? --    No data found.   Updated Vital Signs BP 121/75 (BP Location: Right Arm)   Pulse 64   Temp 97.8 F (36.6 C) (Oral)   Resp 17   Ht 5\' 11"  (1.803 m)   Wt 246 lb (111.6 kg)   SpO2 100%   BMI 34.31 kg/m   Visual Acuity Right Eye Distance:   Left Eye Distance:   Bilateral Distance:    Right Eye Near:   Left Eye Near:    Bilateral Near:     Physical Exam  Constitutional: He appears well-developed and well-nourished. No distress.  Musculoskeletal: He exhibits edema (over left distal forearm).  Skin: Skin is warm. Rash noted. He is not diaphoretic. There is erythema (blanchable erythema, warmth and tenderness over left distal forearm; pinpoint puncture wound area noted).  Nursing note and vitals reviewed.    UC Treatments /  Results  Labs (all labs ordered are listed, but only abnormal results are displayed) Labs Reviewed - No data to display  EKG  EKG Interpretation None       Radiology No results found.  Procedures Procedures (including critical care time)  Medications Ordered in UC Medications  ceFAZolin (ANCEF) injection 1 g (1 g Intramuscular Given 03/23/16 1343)     Initial Impression / Assessment and Plan / UC Course  I have reviewed the triage vital signs and the nursing notes.  Pertinent labs & imaging results that were available during my care of the patient were reviewed by me and considered in my medical decision making (see chart for details).  Clinical Course      Final Clinical Impressions(s) / UC Diagnoses   Final diagnoses:  Left forearm pain  Cellulitis of left forearm    New Prescriptions New Prescriptions   SULFAMETHOXAZOLE-TRIMETHOPRIM (BACTRIM DS,SEPTRA DS) 800-160 MG TABLET    Take 1 tablet by mouth 2 (two) times daily.   1. diagnosis reviewed with patient; given ancef 1gm im x 1 2. rx as per orders above; reviewed possible side effects, interactions, risks and benefits  3. Recommend supportive treatment with warm compresses and elevation of arm; work restrictions 4. Follow-up in 3 days or prn if symptoms worsen or don't improve   Norval Gable, MD 03/23/16 [place], MD 03/23/16 1349

## 2016-03-23 NOTE — ED Notes (Signed)
Urine Drug screen was completed in office today.

## 2016-03-24 ENCOUNTER — Ambulatory Visit (INDEPENDENT_AMBULATORY_CARE_PROVIDER_SITE_OTHER): Payer: 59 | Admitting: Family Medicine

## 2016-03-24 ENCOUNTER — Ambulatory Visit
Admission: RE | Admit: 2016-03-24 | Discharge: 2016-03-24 | Disposition: A | Discharge status: Home / Self Care | Payer: Worker's Compensation | Source: Ambulatory Visit | Attending: Family Medicine | Admitting: Family Medicine

## 2016-03-24 VITALS — BP 112/74 | HR 66 | Temp 98.4°F | Ht 71.0 in | Wt 247.0 lb

## 2016-03-24 DIAGNOSIS — L03114 Cellulitis of left upper limb: Secondary | ICD-10-CM | POA: Insufficient documentation

## 2016-03-24 NOTE — Progress Notes (Signed)
   BP 112/74   Pulse 66   Temp 98.4 F (36.9 C)   Ht 5\' 11"  (1.803 m)   Wt 247 lb (112 kg)   SpO2 98%   BMI 34.45 kg/m    Subjective:    Patient ID: Justin [name], male    DOB: 1965/10/29, 50 y.o.   MRN: [id number]  HPI: Justin [name] is a 50 y.o. male  Chief Complaint  Patient presents with  . Insect Bite    right arm started Monday evening, went to UC yesterday and got a shot of antibiotics and an oral abx. Patient states it's worse today and spreading. Is painful. No fever.   Patient presents for follow up from an UC visit yesterday regarding left arm cellulitis following a potential sting/bite/puncture injury. Patient unaware of what exactly happened, was at work and felt a stinging pain and noticed a puncture hole. Originally had local erythema, pain, and swelling. Was given IM ancef and oral bactrim course yesterday at UC, but redness and edema are now spreading and the pain is from his hand to his elbow. Still no fever, chills, sweats. ROM intact, with some tingling in left thumb.   Relevant past medical, surgical, family and social history reviewed and updated as indicated. Interim medical history since our last visit reviewed. Allergies and medications reviewed and updated.  Review of Systems  Constitutional: Negative.   HENT: Negative.   Eyes: Negative.   Respiratory: Negative.   Cardiovascular: Negative.   Gastrointestinal: Negative.   Genitourinary: Negative.   Musculoskeletal: Positive for arthralgias.  Skin: Negative.   Neurological: Negative.   Psychiatric/Behavioral: Negative.     Per HPI unless specifically indicated above     Objective:    BP 112/74   Pulse 66   Temp 98.4 F (36.9 C)   Ht 5\' 11"  (1.803 m)   Wt 247 lb (112 kg)   SpO2 98%   BMI 34.45 kg/m   Wt Readings from Last 3 Encounters:  03/24/16 247 lb (112 kg)  03/23/16 246 lb (111.6 kg)  09/21/15 240 lb (108.9 kg)    Physical Exam  Constitutional: He is oriented to person,  place, and time. He appears well-developed and well-nourished. No distress.  HENT:  Head: Atraumatic.  Eyes: Conjunctivae are normal. No scleral icterus.  Neck: Normal range of motion. Neck supple.  Cardiovascular: Normal rate and normal heart sounds.   Pulmonary/Chest: Effort normal. No respiratory distress.  Musculoskeletal: Normal range of motion. He exhibits tenderness (moderately TTP from left wrist to elbow).  Left hand reduced grip strength Neurovascularly intact b/l UEs  Lymphadenopathy:    He has no cervical adenopathy.  Neurological: He is alert and oriented to person, place, and time.  Skin: Skin is warm. There is erythema (left forearm from wrist at puncture site up to near elbow).  Diffuse mild edema of left forearm  Psychiatric: He has a normal mood and affect. His behavior is normal.  Nursing note and vitals reviewed.     Assessment & Plan:   Problem List Items Addressed This Visit    None    Visit Diagnoses    Cellulitis of left arm    -  Primary   Await x-ray report. Continue abx, recommended ice, elevation, rest. Will recheck tomorrow   Relevant Orders   DG Forearm Left       Follow up plan: Return in about 1 day (around 03/25/2016) for Wound recheck.

## 2016-03-24 NOTE — Patient Instructions (Signed)
Follow up tomorrow

## 2016-03-25 ENCOUNTER — Ambulatory Visit (INDEPENDENT_AMBULATORY_CARE_PROVIDER_SITE_OTHER): Payer: 59 | Admitting: Family Medicine

## 2016-03-25 ENCOUNTER — Encounter: Payer: Self-pay | Admitting: Family Medicine

## 2016-03-25 ENCOUNTER — Telehealth: Payer: Self-pay | Admitting: Family Medicine

## 2016-03-25 VITALS — BP 112/75 | HR 58 | Temp 98.4°F | Wt 245.9 lb

## 2016-03-25 DIAGNOSIS — L03114 Cellulitis of left upper limb: Secondary | ICD-10-CM | POA: Diagnosis not present

## 2016-03-25 MED ORDER — HYDROXYZINE HCL 25 MG PO TABS: 25.0000 mg | ORAL_TABLET | Freq: Three times a day (TID) | ORAL | 0 refills | Status: DC | PRN

## 2016-03-25 MED ORDER — TRAMADOL HCL 50 MG PO TABS: 50.0000 mg | ORAL_TABLET | Freq: Four times a day (QID) | ORAL | 0 refills | Status: DC | PRN

## 2016-03-25 NOTE — Telephone Encounter (Signed)
Called pt to give x-ray results. Pt didn't answer a message was left asking for  A call back. Will try again later.

## 2016-03-25 NOTE — Progress Notes (Signed)
   BP 112/75 (BP Location: Right Arm, Patient Position: Sitting, Cuff Size: Large)   Pulse (!) 58   Temp 98.4 F (36.9 C)   Wt 245 lb 14.4 oz (111.5 kg)   SpO2 97%   BMI 34.30 kg/m    Subjective:    Patient ID: Justin [name], male    DOB: 12/18/1965, 50 y.o.   MRN: [id number]  HPI: Justin [name] is a 50 y.o. male  Chief Complaint  Patient presents with  . Follow-up    for cellulitis of left arm.    Patient presents for 1 day follow up of left arm cellulitis. Still taking bactrim faithfully, with no reported side effects. Pain is under slightly better control, and redness/warmth is much improved from yesterday. Still trace edema from wrist to elbow, but much improved. X-ray of left forearm negative for FOB or fx. Aside from the pain in forearm, also having severe itching through entire forearm. Has not tried anything for it. Taking tylenol for pain control with minimal relief.   Relevant past medical, surgical, family and social history reviewed and updated as indicated. Interim medical history since our last visit reviewed. Allergies and medications reviewed and updated.  Review of Systems  Constitutional: Negative.  Negative for fever.  HENT: Negative.   Respiratory: Negative.   Cardiovascular: Negative.   Gastrointestinal: Negative.   Genitourinary: Negative.   Musculoskeletal: Positive for arthralgias.  Skin: Positive for color change and wound.  Neurological: Negative.   Psychiatric/Behavioral: Negative.     Per HPI unless specifically indicated above     Objective:    BP 112/75 (BP Location: Right Arm, Patient Position: Sitting, Cuff Size: Large)   Pulse (!) 58   Temp 98.4 F (36.9 C)   Wt 245 lb 14.4 oz (111.5 kg)   SpO2 97%   BMI 34.30 kg/m   Wt Readings from Last 3 Encounters:  03/25/16 245 lb 14.4 oz (111.5 kg)  03/24/16 247 lb (112 kg)  03/23/16 246 lb (111.6 kg)    Physical Exam  Constitutional: He is oriented to person, place, and time. He  appears well-developed and well-nourished. No distress.  HENT:  Head: Atraumatic.  Eyes: Conjunctivae are normal. No scleral icterus.  Neck: Normal range of motion. Neck supple.  Cardiovascular: Normal rate, regular rhythm and normal heart sounds.   Pulmonary/Chest: Effort normal. No respiratory distress.  Musculoskeletal: Normal range of motion. He exhibits edema (trace edema left forearm) and tenderness (Moderate ttp left forearm).  Neurological: He is alert and oriented to person, place, and time.  Skin: Skin is warm and dry. There is erythema (Minimal erythema left forearm).  Small flesh toned papule at puncture site  Psychiatric: He has a normal mood and affect. His behavior is normal.  Nursing note and vitals reviewed.     Assessment & Plan:   Problem List Items Addressed This Visit    None    Visit Diagnoses    Left arm cellulitis    -  Primary   Improving with bactrim. Hydroxyzine given in attempts to calm the itching. Continue ice, elevation, rest. FMLA paperwork filled out and given to pt.     Small amount of Tramadol given to help manage pain while cellulitis is resolving. Precautions discussed. Pt understanding and agreeable to plan.   Follow up plan: Return in about 1 week (around 04/01/2016).

## 2016-03-25 NOTE — Telephone Encounter (Signed)
No foreign objects or evidence of bone infection on x-ray. Will see how your arm is doing this afternoon.

## 2016-03-25 NOTE — Telephone Encounter (Signed)
Patient informed of x ray results in the office when he came for his follow up appt.

## 2016-03-26 ENCOUNTER — Telehealth: Payer: Self-pay | Admitting: *Deleted

## 2016-03-26 NOTE — Telephone Encounter (Signed)
Courtesy call back, verified DOB, Patient reported improvement and that he had followed up with PCP.

## 2016-03-30 ENCOUNTER — Encounter: Payer: Self-pay | Admitting: Family Medicine

## 2016-03-30 ENCOUNTER — Ambulatory Visit (INDEPENDENT_AMBULATORY_CARE_PROVIDER_SITE_OTHER): Payer: 59 | Admitting: Family Medicine

## 2016-03-30 VITALS — BP 116/78 | HR 62 | Temp 98.0°F | Ht 72.5 in | Wt 247.0 lb

## 2016-03-30 DIAGNOSIS — L03114 Cellulitis of left upper limb: Secondary | ICD-10-CM | POA: Diagnosis not present

## 2016-03-30 MED ORDER — PREDNISONE 20 MG PO TABS: 40.0000 mg | ORAL_TABLET | Freq: Every day | ORAL | 0 refills | Status: DC

## 2016-03-30 NOTE — Patient Instructions (Signed)
Follow up 3 days, unless resolved

## 2016-03-30 NOTE — Progress Notes (Signed)
BP 116/78 (BP Location: Left Arm, Patient Position: Sitting, Cuff Size: Large)   Pulse 62   Temp 98 F (36.7 C)   Ht 6' 0.5" (1.842 m) Comment: pt had shoes on  Wt 247 lb (112 kg) Comment: pt had shoes on  SpO2 98%   BMI 33.04 kg/m    Subjective:    Patient ID: Justin [name], male    DOB: [birth year], 50 y.o.   MRN: [id number]  HPI: Justin [name] is a 50 y.o. male  Chief Complaint  Patient presents with  . Cellulitis    1 week f/up for left arm cellulitis   Patient presents for 1 week follow up regarding left forearm cellulitis after a puncture wound of unknown origin at work. Was treated with bactrim and IM ancef with good improvement of diffuse erythema, persistent aching, and edema from hand to elbow. Constant pain has resolved, now only having aching with direct contact to the site. No fevers, residual weakness, numbness/tingling in hand, diffuse edema/erythema has resolved. FMLA paperwork was completed last week for him to return to work today, but he does not feel he can complete his duties yet at this time.   Relevant past medical, surgical, family and social history reviewed and updated as indicated. Interim medical history since our last visit reviewed. Allergies and medications reviewed and updated.  Review of Systems  Constitutional: Negative for chills, diaphoresis and fever.  HENT: Negative.   Respiratory: Negative.   Cardiovascular: Negative.   Gastrointestinal: Negative.   Genitourinary: Negative.   Musculoskeletal: Positive for arthralgias and myalgias.  Skin: Positive for wound.  Neurological: Negative for weakness and numbness.  Psychiatric/Behavioral: Negative.     Per HPI unless specifically indicated above     Objective:    BP 116/78 (BP Location: Left Arm, Patient Position: Sitting, Cuff Size: Large)   Pulse 62   Temp 98 F (36.7 C)   Ht 6' 0.5" (1.842 m) Comment: pt had shoes on  Wt 247 lb (112 kg) Comment: pt had shoes on  SpO2 98%    BMI 33.04 kg/m   Wt Readings from Last 3 Encounters:  03/30/16 247 lb (112 kg)  03/25/16 245 lb 14.4 oz (111.5 kg)  03/24/16 247 lb (112 kg)    Physical Exam  Constitutional: He is oriented to person, place, and time. He appears well-developed and well-nourished. No distress.  HENT:  Head: Atraumatic.  Eyes: Conjunctivae are normal.  Neck: Normal range of motion. Neck supple.  Cardiovascular: Normal rate, regular rhythm and normal heart sounds.   Pulmonary/Chest: Effort normal. No respiratory distress.  Musculoskeletal: He exhibits edema (Localized site edema in distal left forearm ) and tenderness (Moderately TTP focally in distal left forearm surrounding puncture entry wound).  Neurological: He is alert and oriented to person, place, and time. He exhibits normal muscle tone.  Left UE neurovascularly intact  Skin: Skin is warm and dry. No erythema.  Psychiatric: He has a normal mood and affect. His behavior is normal.  Nursing note and vitals reviewed.     Assessment & Plan:   Problem List Items Addressed This Visit    None    Visit Diagnoses    Left arm cellulitis    -  Primary   Resolving, still some residual site inflammation but no evidence of remaining infection. Will treat with prednisone burst and follow up later this week    Will write him out for this week, but he knows to call if he  feels up to going into work sooner.    Follow up plan: Return in about 3 days (around 04/02/2016) for Wound recheck.

## 2016-04-02 ENCOUNTER — Ambulatory Visit (INDEPENDENT_AMBULATORY_CARE_PROVIDER_SITE_OTHER): Payer: 59 | Admitting: Unknown Physician Specialty

## 2016-04-02 ENCOUNTER — Encounter: Payer: Self-pay | Admitting: Unknown Physician Specialty

## 2016-04-02 VITALS — BP 126/80 | HR 61 | Temp 97.6°F | Wt 248.4 lb

## 2016-04-02 DIAGNOSIS — L03114 Cellulitis of left upper limb: Secondary | ICD-10-CM

## 2016-04-02 NOTE — Progress Notes (Signed)
BP 126/80 (BP Location: Left Arm, Patient Position: Sitting, Cuff Size: Large)   Pulse 61   Temp 97.6 F (36.4 C)   Wt 248 lb 6.4 oz (112.7 kg)   SpO2 99%   BMI 33.23 kg/m    Subjective:    Patient ID: Justin [name], male    DOB: 1965/10/10, 50 y.o.   MRN: [id number]  HPI: Justin [name] is a 50 y.o. male  Chief Complaint  Patient presents with  . Wound Check    3 day f/up left arm cellulitis   Patient presents for 1 week and 3 days follow up regarding left forearm cellulitis after a puncture wound of unknown origin at work. Was treated with bactrim and IM ancef with good improvement of diffuse erythema, persistent aching, and edema from hand to elbow. Constant pain has resolved, now only having aching with direct contact to the site. No fevers, residual weakness, numbness/tingling in hand, diffuse edema/erythema has resolved. Was put on Prednisone 3 days ago.  States swelling is mostly gone.  There is no change in symptoms but has been on it for 2 days and has 3 more days.   Relevant past medical, surgical, family and social history reviewed and updated as indicated. Interim medical history since our last visit reviewed. Allergies and medications reviewed and updated.  Review of Systems  Per HPI unless specifically indicated above     Objective:    BP 126/80 (BP Location: Left Arm, Patient Position: Sitting, Cuff Size: Large)   Pulse 61   Temp 97.6 F (36.4 C)   Wt 248 lb 6.4 oz (112.7 kg)   SpO2 99%   BMI 33.23 kg/m   Wt Readings from Last 3 Encounters:  04/02/16 248 lb 6.4 oz (112.7 kg)  03/30/16 247 lb (112 kg)  03/25/16 245 lb 14.4 oz (111.5 kg)    Physical Exam  Constitutional: He is oriented to person, place, and time. He appears well-developed and well-nourished. No distress.  HENT:  Head: Normocephalic and atraumatic.  Eyes: Conjunctivae and lids are normal. Right eye exhibits no discharge. Left eye exhibits no discharge. No scleral icterus.    Cardiovascular: Normal rate.   Pulmonary/Chest: Effort normal.  Abdominal: Normal appearance. There is no splenomegaly or hepatomegaly.  Musculoskeletal: Normal range of motion.  I note no swelling or warmth in left forearm  Neurological: He is alert and oriented to person, place, and time.  Skin: Skin is intact. No rash noted. No pallor.  Psychiatric: He has a normal mood and affect. His behavior is normal. Judgment and thought content normal.    Results for orders placed or performed during the hospital encounter of 09/21/15  Culture, group A strep  Result Value Ref Range   Specimen Description THROAT    Special Requests NONE    Culture NO BETA HEMOLYTIC STREPTOCOCCI ISOLATED    Report Status 09/23/2015 FINAL   Rapid Influenza A&B Antigens ([facility] only)  Result Value Ref Range   Influenza A ([facility]) NEGATIVE NEGATIVE   Influenza B ([facility]) POSITIVE (A) NEGATIVE  POCT rapid strep A [facility])  Result Value Ref Range   Streptococcus, Group A Screen (Direct) NEGATIVE NEGATIVE      Assessment & Plan:   Problem List Items Addressed This Visit    None    Visit Diagnoses    Cellulitis of arm, left    -  Primary   Seems to be improving.  RTC if he feels he is unable to  return to work on Monday       Follow up plan: Return if symptoms worsen or fail to improve.

## 2016-06-07 ENCOUNTER — Emergency Department: Payer: 59

## 2016-06-07 ENCOUNTER — Encounter: Payer: Self-pay | Admitting: Emergency Medicine

## 2016-06-07 ENCOUNTER — Emergency Department
Admission: EM | Admit: 2016-06-07 | Discharge: 2016-06-07 | Disposition: A | Discharge status: Home / Self Care | Payer: 59 | Attending: Emergency Medicine | Admitting: Emergency Medicine

## 2016-06-07 DIAGNOSIS — I82622 Acute embolism and thrombosis of deep veins of left upper extremity: Secondary | ICD-10-CM | POA: Diagnosis not present

## 2016-06-07 DIAGNOSIS — R600 Localized edema: Secondary | ICD-10-CM | POA: Diagnosis not present

## 2016-06-07 DIAGNOSIS — M25512 Pain in left shoulder: Secondary | ICD-10-CM | POA: Diagnosis not present

## 2016-06-07 LAB — CBC WITH DIFFERENTIAL/PLATELET
Basophils Absolute: 0.1 10*3/uL (ref 0–0.1)
Basophils Relative: 1 %
Eosinophils Absolute: 0.2 10*3/uL (ref 0–0.7)
Eosinophils Relative: 2 %
HCT: 46.1 % (ref 40.0–52.0)
Hemoglobin: 15.6 g/dL (ref 13.0–18.0)
Lymphocytes Relative: 27 %
Lymphs Abs: 2.7 10*3/uL (ref 1.0–3.6)
MCH: 31.1 pg (ref 26.0–34.0)
MCHC: 33.9 g/dL (ref 32.0–36.0)
MCV: 91.8 fL (ref 80.0–100.0)
Monocytes Absolute: 0.9 10*3/uL (ref 0.2–1.0)
Monocytes Relative: 9 %
Neutro Abs: 6 10*3/uL (ref 1.4–6.5)
Neutrophils Relative %: 61 %
Platelets: 225 10*3/uL (ref 150–440)
RBC: 5.01 MIL/uL (ref 4.40–5.90)
RDW: 13.4 % (ref 11.5–14.5)
WBC: 9.9 10*3/uL (ref 3.8–10.6)

## 2016-06-07 LAB — PROTIME-INR
INR: 1.34
Prothrombin Time: 16.7 seconds — ABNORMAL HIGH (ref 11.4–15.2)

## 2016-06-07 LAB — BASIC METABOLIC PANEL
Anion gap: 4 — ABNORMAL LOW (ref 5–15)
BUN: 13 mg/dL (ref 6–20)
CO2: 26 mmol/L (ref 22–32)
Calcium: 9.2 mg/dL (ref 8.9–10.3)
Chloride: 107 mmol/L (ref 101–111)
Creatinine, Ser: 1.41 mg/dL — ABNORMAL HIGH (ref 0.61–1.24)
GFR calc Af Amer: >60 mL/min (ref >60)
GFR calc non Af Amer: 56 mL/min — ABNORMAL LOW (ref >60)
Glucose, Bld: 87 mg/dL (ref 65–99)
Potassium: 4.1 mmol/L (ref 3.5–5.1)
Sodium: 137 mmol/L (ref 135–145)

## 2016-06-07 LAB — HEPARIN LEVEL (UNFRACTIONATED): Heparin Unfractionated: 2.4 IU/mL — ABNORMAL HIGH (ref 0.30–0.70)

## 2016-06-07 LAB — APTT: aPTT: 35 seconds (ref 24–36)

## 2016-06-07 MED ORDER — ENOXAPARIN SODIUM 150 MG/ML ~~LOC~~ SOLN: 1.0000 mg/kg | Freq: Two times a day (BID) | SUBCUTANEOUS | 0 refills | Status: DC

## 2016-06-07 NOTE — ED Provider Notes (Signed)
[facility] Emergency Department Provider Note  ____________________________________________   I have reviewed the triage vital signs and the nursing notes.   HISTORY  Chief Complaint Shoulder Pain   History limited by: Not Limited   HPI Justin [name] is a 51 y.o. male who presents to the emergency department today because of concern for left shoulder pain and swelling. The symptoms started one day ago. They have been constant. Patient has had some associated tingling going down his arm. Denies any trauma to that limb. Patient does have a history of blood clots in the past and states that these symptoms remind him of his previous clots. The patient is currently on Xarelto for blood clots. Denies any recent disruption in taking his medication.    Past Medical History:  Diagnosis Date  . Arm vein blood clot    right  . DVT (deep venous thrombosis) ([place]) 2012; January 2016   History of right arm; now right lower shotty  . GERD (gastroesophageal reflux disease)   . Vascular thoracic outlet syndrome    Right     Patient Active Problem List   Diagnosis Date Noted  . Abnormal finding on EKG 10/04/2014  . Atypical chest pain 10/04/2014    Past Surgical History:  Procedure Laterality Date  . CAROTID ANGIOGRAM    . KNEE CARTILAGE SURGERY     Performed for cracked patella and torn meniscus  . Right upper extremity thrombectomy  2012  . SCALENOTOMY W/O RESECTION CERVICAL RIB  2012   4 right-sided thoracic outlet syndrome    Prior to Admission medications   Medication Sig Start Date End Date Taking? Authorizing Provider  hydrOXYzine (ATARAX/VISTARIL) 25 MG tablet Take 1 tablet (25 mg total) by mouth 3 (three) times daily as needed. 03/25/16   Volney American, PA-C  predniSONE (DELTASONE) 20 MG tablet Take 2 tablets (40 mg total) by mouth daily with breakfast. 03/30/16   Volney American, PA-C  sulfamethoxazole-trimethoprim (BACTRIM DS,SEPTRA  DS) 800-160 MG tablet Take 1 tablet by mouth 2 (two) times daily. 03/23/16   Norval Gable, MD  topiramate (TOPAMAX) 50 MG tablet Take 50 mg by mouth daily. 01/28/16 04/27/16  Historical Provider, MD  traMADol (ULTRAM) 50 MG tablet Take 1 tablet (50 mg total) by mouth every 6 (six) hours as needed. 03/25/16   Volney American, PA-C  XARELTO 20 MG TABS tablet Take 20 mg by mouth daily with supper.  09/24/14   Historical Provider, MD    Allergies Patient has no known allergies.  Family History  Problem Relation Age of Onset  . Heart disease Father   . Breast cancer Father   . Diabetes Father   . Heart attack Father   . Cancer Maternal Grandmother   . Leukemia Maternal Grandfather     Social History Social History  Substance Use Topics  . Smoking status: Never Smoker  . Smokeless tobacco: Never Used  . Alcohol use No    Review of Systems  Constitutional: Negative for fever. Cardiovascular: Negative for chest pain. Respiratory: Negative for shortness of breath. Gastrointestinal: Negative for abdominal pain, vomiting and diarrhea. Musculoskeletal: Positive for left shoulder pain and swelling. Neurological: Negative for headaches, focal weakness or numbness.  10-point ROS otherwise negative.  ____________________________________________   PHYSICAL EXAM:  VITAL SIGNS: ED Triage Vitals  Enc Vitals Group     BP 06/07/16 1253 (!) 141/99     Pulse Rate 06/07/16 1253 62     Resp 06/07/16 1253  20     Temp 06/07/16 1253 98 F (36.7 C)     Temp Source 06/07/16 1253 Oral     SpO2 06/07/16 1253 99 %     Weight 06/07/16 1253 246 lb (111.6 kg)     Height 06/07/16 1253 5\' 11"  (1.803 m)     Head Circumference --      Peak Flow --      Pain Score 06/07/16 1254 8   Constitutional: Alert and oriented. Well appearing and in no distress. Eyes: Conjunctivae are normal. Normal extraocular movements. ENT   Head: Normocephalic and atraumatic.   Nose: No  congestion/rhinnorhea.   Mouth/Throat: Mucous membranes are moist.   Neck: No stridor. Hematological/Lymphatic/Immunilogical: No cervical lymphadenopathy. Cardiovascular: Normal rate, regular rhythm.  No murmurs, rubs, or gallops.  Respiratory: Normal respiratory effort without tachypnea nor retractions. Breath sounds are clear and equal bilaterally. No wheezes/rales/rhonchi. Gastrointestinal: Soft and non tender. No rebound. No guarding.  Genitourinary: Deferred Musculoskeletal: Normal range of motion in all extremities. Left shoulder and upper arm with some swelling. Some tenderness to palpation of the left upper chest. RP 2+ bilaterally. Neurologic:  Normal speech and language. No gross focal neurologic deficits are appreciated.  Skin:  Skin is warm, dry and intact. No rash noted. Psychiatric: Mood and affect are normal. Speech and behavior are normal. Patient exhibits appropriate insight and judgment.  ____________________________________________    LABS (pertinent positives/negatives)  Labs Reviewed  PROTIME-INR - Abnormal; Notable for the following:       Result Value   Prothrombin Time 16.7 (*)    All other components within normal limits  BASIC METABOLIC PANEL - Abnormal; Notable for the following:    Creatinine, Ser 1.41 (*)    GFR calc non Af Amer 56 (*)    Anion gap 4 (*)    All other components within normal limits  CBC WITH DIFFERENTIAL/PLATELET  APTT  HEPARIN LEVEL (UNFRACTIONATED)     ____________________________________________   EKG  None  ____________________________________________    RADIOLOGY  RUE US IMPRESSION:  Near occlusive DVT in the lateral aspect of the left subclavian vein  and nonocclusive thrombus extending into the adjacent axillary vein.     ____________________________________________   PROCEDURES  Procedures  ____________________________________________   INITIAL IMPRESSION / ASSESSMENT AND PLAN / ED  COURSE  Pertinent labs & imaging results that were available during my care of the patient were reviewed by me and considered in my medical decision making (see chart for details).  Patient with left shoulder pain and swelling. [place] does show DVT of subclavian vein. Patient currently on xarelto. Discussed with Dr. Mike Gip with hematology. Did think that switching to lovenox was appropriate and can follow up with patient as an outpatient. The patient is not having chest pain or shortness of breath, nor any vital sign abnormalities to suggest PE.   ____________________________________________   FINAL CLINICAL IMPRESSION(S) / ED DIAGNOSES  Final diagnoses:  Acute deep vein thrombosis (DVT) of left upper extremity, unspecified vein (HCC)     Note: This dictation was prepared with Dragon dictation. Any transcriptional errors that result from this process are unintentional     Nance Pear, MD 06/07/16 1652

## 2016-06-07 NOTE — ED Notes (Signed)
MD Goodman at bedside at this time.  

## 2016-06-07 NOTE — Discharge Instructions (Signed)
Please seek medical attention for any high fevers, chest pain, shortness of breath, change in behavior, persistent vomiting, bloody stool or any other new or concerning symptoms.  

## 2016-06-07 NOTE — ED Notes (Signed)

## 2016-06-07 NOTE — ED Triage Notes (Signed)
Patient presents to the ED with swelling and pain to his left shoulder x 1 week.  Patient reports symptoms are similar to symptoms he has had previously with blood clots in his right leg and right arm.  Patient reports that he takes xarelto.  Patient states, "they don't know why I get so many blood clots."  Patient states, "at first I thought this was a catch in my shoulder but now I'm worried it's a blood clot."

## 2016-06-07 NOTE — ED Notes (Signed)
This RN called ultrasound, patient almost finished with ultrasound. Staff instructed to take patient to treatment room when finished. Primary RN notified.

## 2016-06-09 LAB — MISC LABCORP TEST (SEND OUT): Labcorp test code: 504430

## 2016-06-18 DIAGNOSIS — J069 Acute upper respiratory infection, unspecified: Secondary | ICD-10-CM | POA: Diagnosis not present

## 2016-06-21 ENCOUNTER — Encounter: Payer: Self-pay | Admitting: Family Medicine

## 2016-06-21 ENCOUNTER — Ambulatory Visit (INDEPENDENT_AMBULATORY_CARE_PROVIDER_SITE_OTHER): Payer: 59 | Admitting: Family Medicine

## 2016-06-21 ENCOUNTER — Inpatient Hospital Stay: Payer: 59 | Admitting: Hematology and Oncology

## 2016-06-21 VITALS — BP 126/83 | HR 63 | Temp 98.0°F | Wt 253.0 lb

## 2016-06-21 DIAGNOSIS — I82622 Acute embolism and thrombosis of deep veins of left upper extremity: Secondary | ICD-10-CM

## 2016-06-21 LAB — CBC WITH DIFFERENTIAL/PLATELET
Hematocrit: 45.1 % (ref 37.5–51.0)
Hemoglobin: 15.1 g/dL (ref 13.0–17.7)
Lymphocytes Absolute: 3 10*3/uL (ref 0.7–3.1)
Lymphs: 29 %
MCH: 31.2 pg (ref 26.6–33.0)
MCHC: 33.5 g/dL (ref 31.5–35.7)
MCV: 93 fL (ref 79–97)
MID (Absolute): 1.1 10*3/uL (ref 0.1–1.6)
MID: 11 %
Neutrophils Absolute: 6.1 10*3/uL (ref 1.4–7.0)
Neutrophils: 60 %
Platelets: 245 10*3/uL (ref 150–379)
RBC: 4.84 x10E6/uL (ref 4.14–5.80)
RDW: 13.8 % (ref 12.3–15.4)
WBC: 10.2 10*3/uL (ref 3.4–10.8)

## 2016-06-21 LAB — COAGUCHEK XS/INR WAIVED
INR: 1.3 — ABNORMAL HIGH (ref 0.9–1.1)
Prothrombin Time: 15.5 s

## 2016-06-21 NOTE — Progress Notes (Signed)
BP 126/83   Pulse 63   Temp 98 F (36.7 C)   Wt 253 lb (114.8 kg)   SpO2 100%   BMI 35.29 kg/m    Subjective:    Patient ID: ANTWANE VONHOLTEN, male    DOB: [birth year], 51 y.o.   MRN: [id number]  HPI: OC LEMMEN is a 51 y.o. male  Chief Complaint  Patient presents with  . Follow-up    ED follow up for DVT in left shoulder. Still taking Lovenox injections and Xarelto.   Patient presents for ER follow up from 06/07/16 for left subclavian DVT. This is his 4th DVT, currently on lovenox as well as his regular xarelto. Scheduled for Hematology follow up next week. No bleeding or bruising issues while on the lovenox. Still having pain in shoulder area, some better from before but still present. Denies SOB, CP, hematemesis.   Relevant past medical, surgical, family and social history reviewed and updated as indicated. Interim medical history since our last visit reviewed. Allergies and medications reviewed and updated.  Review of Systems  Constitutional: Negative.   HENT: Negative.   Eyes: Negative.   Respiratory: Negative.   Cardiovascular: Negative.   Gastrointestinal: Negative.  Negative for blood in stool.  Genitourinary: Negative.   Musculoskeletal: Positive for arthralgias.  Neurological: Negative.   Hematological: Does not bruise/bleed easily.  Psychiatric/Behavioral: Negative.     Per HPI unless specifically indicated above     Objective:    BP 126/83   Pulse 63   Temp 98 F (36.7 C)   Wt 253 lb (114.8 kg)   SpO2 100%   BMI 35.29 kg/m   Wt Readings from Last 3 Encounters:  06/21/16 253 lb (114.8 kg)  06/07/16 246 lb (111.6 kg)  04/02/16 248 lb 6.4 oz (112.7 kg)    Physical Exam  Constitutional: He is oriented to person, place, and time. He appears well-developed and well-nourished. No distress.  HENT:  Head: Atraumatic.  Neck: Normal range of motion. Neck supple.  Cardiovascular: Normal rate and intact distal pulses.   Pulmonary/Chest: Effort  normal and breath sounds normal. No respiratory distress.  Abdominal: Soft. Bowel sounds are normal.  Musculoskeletal: Normal range of motion.  Neurological: He is alert and oriented to person, place, and time.  Skin: Skin is warm and dry.  Psychiatric: He has a normal mood and affect. His behavior is normal.  Nursing note and vitals reviewed.   Results for orders placed or performed during the hospital encounter of 06/07/16  CBC with Differential  Result Value Ref Range   WBC 9.9 3.8 - 10.6 K/uL   RBC 5.01 4.40 - 5.90 MIL/uL   Hemoglobin 15.6 13.0 - 18.0 g/dL   HCT 46.1 40.0 - 52.0 %   MCV 91.8 80.0 - 100.0 fL   MCH 31.1 26.0 - 34.0 pg   MCHC 33.9 32.0 - 36.0 g/dL   RDW 13.4 11.5 - 14.5 %   Platelets 225 150 - 440 K/uL   Neutrophils Relative % 61 %   Neutro Abs 6.0 1.4 - 6.5 K/uL   Lymphocytes Relative 27 %   Lymphs Abs 2.7 1.0 - 3.6 K/uL   Monocytes Relative 9 %   Monocytes Absolute 0.9 0.2 - 1.0 K/uL   Eosinophils Relative 2 %   Eosinophils Absolute 0.2 0 - 0.7 K/uL   Basophils Relative 1 %   Basophils Absolute 0.1 0 - 0.1 K/uL  APTT  Result Value Ref Range   aPTT 35  24 - 36 seconds  Protime-INR  Result Value Ref Range   Prothrombin Time 16.7 (H) 11.4 - 15.2 seconds   INR [id number]   Basic metabolic panel  Result Value Ref Range   Sodium 137 135 - 145 mmol/L   Potassium 4.1 3.5 - 5.1 mmol/L   Chloride 107 101 - 111 mmol/L   CO2 26 22 - 32 mmol/L   Glucose, Bld 87 65 - 99 mg/dL   BUN 13 6 - 20 mg/dL   Creatinine, Ser 1.41 (H) 0.61 - 1.24 mg/dL   Calcium 9.2 8.9 - 10.3 mg/dL   GFR calc non Af Amer 56 (L) >60 mL/min   GFR calc Af Amer >60 >60 mL/min   Anion gap 4 (L) 5 - 15  Heparin level (unfractionated)  Result Value Ref Range   Heparin Unfractionated 2.40 (H) 0.30 - 0.70 IU/mL  Miscellaneous [facility] test (send-out)  Result Value Ref Range   [facility] test code [id number]    [facility] test name EDOXABAN ANTI XA    Misc [facility] result COMMENT       Assessment & Plan:    Problem List Items Addressed This Visit    None    Visit Diagnoses    Acute deep vein thrombosis (DVT) of other vein of left upper extremity (HCC)    -  Primary   Continue on lovenox and xarelto until consultation with Hematology next week. Await labs in meantime.    Relevant Orders   CBC With Differential/Platelet   CoaguChek XS/INR Waived       Follow up plan: Return for as scheduled with Hematology.

## 2016-06-21 NOTE — Patient Instructions (Signed)
Follow up as scheduled.  

## 2016-06-29 ENCOUNTER — Telehealth: Payer: Self-pay | Admitting: *Deleted

## 2016-06-29 ENCOUNTER — Inpatient Hospital Stay: Payer: 59

## 2016-06-29 ENCOUNTER — Inpatient Hospital Stay: Payer: 59 | Attending: Hematology and Oncology | Admitting: Hematology and Oncology

## 2016-06-29 ENCOUNTER — Encounter: Payer: Self-pay | Admitting: Hematology and Oncology

## 2016-06-29 VITALS — BP 109/73 | HR 60 | Temp 94.9°F | Resp 18 | Ht 71.5 in | Wt 249.8 lb

## 2016-06-29 DIAGNOSIS — M898X1 Other specified disorders of bone, shoulder: Secondary | ICD-10-CM

## 2016-06-29 DIAGNOSIS — R0602 Shortness of breath: Secondary | ICD-10-CM | POA: Insufficient documentation

## 2016-06-29 DIAGNOSIS — Z87891 Personal history of nicotine dependence: Secondary | ICD-10-CM | POA: Diagnosis not present

## 2016-06-29 DIAGNOSIS — I82B12 Acute embolism and thrombosis of left subclavian vein: Secondary | ICD-10-CM

## 2016-06-29 DIAGNOSIS — R0600 Dyspnea, unspecified: Secondary | ICD-10-CM

## 2016-06-29 DIAGNOSIS — I82622 Acute embolism and thrombosis of deep veins of left upper extremity: Secondary | ICD-10-CM

## 2016-06-29 DIAGNOSIS — K219 Gastro-esophageal reflux disease without esophagitis: Secondary | ICD-10-CM | POA: Diagnosis not present

## 2016-06-29 DIAGNOSIS — Z86718 Personal history of other venous thrombosis and embolism: Secondary | ICD-10-CM | POA: Diagnosis not present

## 2016-06-29 DIAGNOSIS — Z7901 Long term (current) use of anticoagulants: Secondary | ICD-10-CM | POA: Insufficient documentation

## 2016-06-29 LAB — CBC WITH DIFFERENTIAL/PLATELET
Basophils Absolute: 0.1 10*3/uL (ref 0–0.1)
Basophils Relative: 1 %
Eosinophils Absolute: 0.2 10*3/uL (ref 0–0.7)
Eosinophils Relative: 3 %
HCT: 43.5 % (ref 40.0–52.0)
Hemoglobin: 14.7 g/dL (ref 13.0–18.0)
Lymphocytes Relative: 27 %
Lymphs Abs: 2.4 10*3/uL (ref 1.0–3.6)
MCH: 30.7 pg (ref 26.0–34.0)
MCHC: 33.8 g/dL (ref 32.0–36.0)
MCV: 90.9 fL (ref 80.0–100.0)
Monocytes Absolute: 0.9 10*3/uL (ref 0.2–1.0)
Monocytes Relative: 10 %
Neutro Abs: 5.3 10*3/uL (ref 1.4–6.5)
Neutrophils Relative %: 59 %
Platelets: 248 10*3/uL (ref 150–440)
RBC: 4.78 MIL/uL (ref 4.40–5.90)
RDW: 13.3 % (ref 11.5–14.5)
WBC: 8.9 10*3/uL (ref 3.8–10.6)

## 2016-06-29 LAB — COMPREHENSIVE METABOLIC PANEL
ALT: 59 U/L (ref 17–63)
AST: 30 U/L (ref 15–41)
Albumin: 3.8 g/dL (ref 3.5–5.0)
Alkaline Phosphatase: 76 U/L (ref 38–126)
Anion gap: 4 — ABNORMAL LOW (ref 5–15)
BUN: 14 mg/dL (ref 6–20)
CO2: 23 mmol/L (ref 22–32)
Calcium: 9 mg/dL (ref 8.9–10.3)
Chloride: 109 mmol/L (ref 101–111)
Creatinine, Ser: 1.34 mg/dL — ABNORMAL HIGH (ref 0.61–1.24)
GFR calc Af Amer: >60 mL/min (ref >60)
GFR calc non Af Amer: 60 mL/min — ABNORMAL LOW (ref >60)
Glucose, Bld: 105 mg/dL — ABNORMAL HIGH (ref 65–99)
Potassium: 3.7 mmol/L (ref 3.5–5.1)
Sodium: 136 mmol/L (ref 135–145)
Total Bilirubin: 0.3 mg/dL (ref 0.3–1.2)
Total Protein: 7.5 g/dL (ref 6.5–8.1)

## 2016-06-29 NOTE — Progress Notes (Signed)
Patient here today as new evaluation regarding thrombosis.  Referred by Dr. Archie Balboa.

## 2016-06-29 NOTE — Telephone Encounter (Signed)
Demonstrated and instructed pt and wife on administration of lovenox and wasting to 115 mg to correct dose, pt and wife voiced understanding.  Instructed pt to call back with questions or if he needed further information. Voiced understanding.

## 2016-06-29 NOTE — Progress Notes (Addendum)
[facility] day:  06/29/2016  Chief Complaint: Justin [name] is a 51 y.o. male with recurrent thrombosis who is referred in consultation by Dr. Nance [name] for assessment and management.  HPI:  The patient states that he had his first clot in 2010.  He developed a right arm clot "all up and down my arm".  He states at that time, he was out of work for awhile secondary to back pain.  He walked with a cane, but was not at bed rest.  He was seen by Dr. Rica [name].  He underwent a "prcedure to break up the clot".  He was on Lovenox then Coumadin for 6 months.  He developed his second clot in 2011.  He believes the clot was "higher up" (? subclavian vein).  He states that part of his 1st right rib was removed for thoracic outlet decompression.  He states that he had a complication ("right lung torn").  He was hospitalized for 14 days.  He was again on Coumadin for 6-9 months.  He developed a clot in his right lower extremity (calf) in 2016.  He describes his knee aching and feeling like "something was torn in my knee".  He was seen at [facility] ([facility] and stared on Xarelto.  He states that he has been complaint with his Xarelto.  He may have missed 1 or 2 doses in total.  He describes feeling a catch in his left back and under his scapula.  He then describes his left shoulder and back becoming painful.  He states that he worked through [employer] day.  Pain would not go away.  He then notes that the pain was in his upper chest and left arm.  He describes some trouble breathing.  He presented to the ER at [facility].  Left upper extremity duplex on 06/07/2016 revealed a near occlusive DVT in the lateral aspect of the left subclavian vein and nonocclusive thrombus extending into the adjacent axillary vein.  He was started on Lovenox.  He has continued his Xarelto.  Symptomatically, he notes decreased pain in his left side and arm.  His left arm aches.  He is still a  little short of breath.  He denies any fevers, sweats or weight loss.  He notes some visual changes (trouble reading with glasses that started last year).  Left scapula is tender.  He denies any family history of thrombosis or early pregnancy loss.   Past Medical History:  Diagnosis Date  . Arm vein blood clot    right  . DVT (deep venous thrombosis) ([place]) 2012; January 2016   History of right arm; now right lower shotty  . GERD (gastroesophageal reflux disease)   . Vascular thoracic outlet syndrome    Right     Past Surgical History:  Procedure Laterality Date  . CAROTID ANGIOGRAM    . KNEE CARTILAGE SURGERY     Performed for cracked patella and torn meniscus  . Right upper extremity thrombectomy  2012  . SCALENOTOMY W/O RESECTION CERVICAL RIB  2012   4 right-sided thoracic outlet syndrome    Family History  Problem Relation Age of Onset  . Heart disease Father   . Breast cancer Father   . Diabetes Father   . Heart attack Father   . Cancer Maternal Grandmother   . Leukemia Maternal Grandfather     Social History:  reports that he has never smoked. He has never used smokeless tobacco.  He reports that he does not drink alcohol or use drugs.  He previously smoked 1/2 pack a day for 10 years.  He stopped smoking in his 45s.  He lives in [place].  He is a Glass blower/designer.  He denies any exposure to radiation or toxins.  He has a son.  The patient is accompanied by his wife, Justin [name], today.  Allergies: No Known Allergies  Current Medications: Current Outpatient Prescriptions  Medication Sig Dispense Refill  . enoxaparin (LOVENOX) 150 MG/ML injection Inject 0.74 mLs (110 mg total) into the skin 2 (two) times daily. 60 Syringe 0  . topiramate (TOPAMAX) 50 MG tablet Take 50 mg by mouth daily.    Alveda Reasons 20 MG TABS tablet Take 20 mg by mouth daily with supper.     Marland Kitchen azithromycin (ZITHROMAX) 250 MG tablet Take 2 tablets (500mg ) by mouth on Day 1. Take 1 tablet (250mg ) by mouth on  Days 2-5.    Marland Kitchen HYDROcodone-homatropine (HYCODAN) 5-1.5 MG/5ML syrup Take by mouth.    . hydrOXYzine (ATARAX/VISTARIL) 25 MG tablet Take 1 tablet (25 mg total) by mouth 3 (three) times daily as needed. (Patient not taking: Reported on 06/29/2016) 30 tablet 0  . SUMAtriptan (IMITREX) 100 MG tablet Take 100 mg by mouth.     No current facility-administered medications for this visit.     Review of Systems:  GENERAL:  Feels "ok".  No fevers, sweats or weight loss. PERFORMANCE STATUS (ECOG):  1 HEENT:  Visual changes since last year (see HPI).  No runny nose, sore throat, mouth sores or tenderness. Lungs: Mild shortness of breath.  No ough.  No hemoptysis. Cardiac:  No chest pain, palpitations, orthopnea, or PND. GI:  No nausea, vomiting, diarrhea, constipation, melena or hematochezia. GU:  No urgency, frequency, dysuria, or hematuria. Musculoskeletal:  Left arm ache.  Left scapula pain.  No muscle tenderness. Extremities:  No pain or swelling. Skin:  Bruise on hip.  Rash on side.  Neuro:  No headache, numbness or weakness, balance or coordination issues. Endocrine:  No diabetes, thyroid issues, hot flashes or night sweats. Psych:  No mood changes, depression or anxiety. Pain:  No focal pain. Review of systems:  All other systems reviewed and found to be negative.  Physical Exam: Blood pressure 109/73, pulse 60, temperature (!) 94.9 F (34.9 C), temperature source Tympanic, resp. rate 18, height 5' 11.5" (1.816 m), weight 249 lb 12.5 oz (113.3 kg). GENERAL:  Well developed, well nourished, muscular gentleman sitting comfortably in the exam room in no acute distress. MENTAL STATUS:  Alert and oriented to person, place and time. HEAD: Alopecia.  Justin [name].  Normocephalic, atraumatic, face symmetric, no Cushingoid features. EYES:  Glasses.  Brown eyes.  Pupils equal round and reactive to light and accomodation.  No conjunctivitis or scleral icterus. ENT:  Oropharynx clear without lesion.   Tongue normal. Mucous membranes moist.  RESPIRATORY:  Clear to auscultation without rales, wheezes or rhonchi. CARDIOVASCULAR:  Regular rate and rhythm without murmur, rub or gallop. ABDOMEN:  Soft, non-tender, with active bowel sounds, and no hepatosplenomegaly.  No masses. BACK:  Tender left scapula. SKIN:  No rashes, ulcers or lesions. EXTREMITIES: Mild right upper extremity edema.  No skin discoloration or tenderness.  No palpable cords. LYMPH NODES: No palpable cervical, supraclavicular, axillary or inguinal adenopathy  NEUROLOGICAL: Unremarkable. PSYCH:  Appropriate.   No visits with results within 3 Day(s) from this visit.  Latest known visit with results is:  Office Visit on 06/21/2016  Component Date Value Ref Range Status  . WBC 06/21/2016 10.2  3.4 - 10.8 x10E3/uL Final  . RBC 06/21/2016 4.84  4.14 - 5.80 x10E6/uL Final  . Hemoglobin 06/21/2016 15.1  13.0 - 17.7 g/dL Final  . Hematocrit 06/21/2016 45.1  37.5 - 51.0 % Final  . MCV 06/21/2016 93  79 - 97 fL Final  . MCH 06/21/2016 31.2  26.6 - 33.0 pg Final  . MCHC 06/21/2016 33.5  31.5 - 35.7 g/dL Final  . RDW 06/21/2016 13.8  12.3 - 15.4 % Final  . Platelets 06/21/2016 245  150 - 379 x10E3/uL Final  . Neutrophils 06/21/2016 60  Not Estab. % Final  . Lymphs 06/21/2016 29  Not Estab. % Final  . MID 06/21/2016 11  Not Estab. % Final  . Neutrophils Absolute 06/21/2016 6.1  1.4 - 7.0 x10E3/uL Final  . Lymphocytes Absolute 06/21/2016 3.0  0.7 - 3.1 x10E3/uL Final  . MID (Absolute) 06/21/2016 1.1  0.1 - 1.6 X10E3/uL Final  . INR 06/21/2016 1.3* 0.9 - 1.1 Final  . Prothrombin Time 06/21/2016 15.5  sec Final   Comment: Differences in reagents, instruments, and pre-analytical variables can affect prothrombin time results.  These factors should be considered when comparing different prothrombin time test methods. Please Note: This test should not be used to monitor persons on heparin therapy.     Assessment:  Justin [name] is a 51 y.o. male with recurrent thrombosis x 4.  He developed a right upper extremity clot in 2010. He underwent thrombolysis.  He was on Coumadin x 6 months.  He developed a recurrent clot in the right upper extremity in 2011.  He underwent thoracic outlet decompression.  He was on Coumadin for 6-9 months.  He developed a right lower extremity DVT in 2016.  He was started on Xarelto.  He developed a left upper extremity DVT on 06/07/2016 while on Xarelto.  Duplex revealed a near occlusive DVT in the lateral aspect of the left subclavian vein and nonocclusive thrombus extending into the adjacent axillary vein.  He was started on Lovenox.  Symptomatically, he notes decreased pain in his left side and arm.  His left arm aches.  He is still a little short of breath.  He denies any fevers, sweats or weight loss.  Left scapula is tender.  Plan: 1.  Discuss history of recurrent thrombosis (bilateral upper extremity and lower extremity).  Discuss initial concern for thoracic outlet syndrome as patient underwent thoracic outlet decompression.  Discuss hypercoagulable work-up.  Discuss referral back to Dr. Delana Meyer.  Discuss life long anticoagulation.  Discuss continuation of Lovenox and discontinuation of Xarelto.  Discuss imaging of chest given shortness of breath (r/o pulmonary embolism) and tender scapula.  2.  Labs today:  CBC with diff, CMP, factor V Leiden, prothrombin gene mutation, lupus anticoagulant panel, anticardiolipin antibodies, beta2-glycoprotein antibodies, protein C antigen/activity, protein S antigen/activity, ATIII antigen/activity.  3.  Chest CT angiogram: assess for pulmonary emboli given shortness of breath and evaluation of bone pain. 4.  Please schedule follow-up with Dr. Delana Meyer 5.  Continue Lovenox 1 mg/kq SQ every 12 hours.  Patient to have nursing teaching re: Lovenox injections (using 150 mg syringes). 6.  Discontinue Xarelto. 7.  Patient to follow-up with  opthalmology. 8.  RTC after above for MD assessment and review of testing.   Lequita Asal, MD  06/29/2016, 11:30 AM

## 2016-06-30 LAB — PROTEIN C, TOTAL: Protein C, Total: 99 % (ref 60–150)

## 2016-07-01 LAB — PROTEIN C ACTIVITY: Protein C Activity: 116 % (ref 73–180)

## 2016-07-01 LAB — BETA-2-GLYCOPROTEIN I ABS, IGG/M/A
Beta-2 Glyco I IgG: <9 GPI IgG units (ref 0–20)
Beta-2-Glycoprotein I IgA: <9 GPI IgA units (ref 0–25)
Beta-2-Glycoprotein I IgM: <9 GPI IgM units (ref 0–32)

## 2016-07-01 LAB — ANTITHROMBIN PANEL
AT III AG PPP IMM-ACNC: 81 % (ref 72–124)
Antithrombin Activity: 130 % (ref 75–135)

## 2016-07-01 LAB — PROTEIN S ACTIVITY: Protein S Activity: 136 % (ref 63–140)

## 2016-07-01 LAB — PROTEIN S, TOTAL: Protein S Ag, Total: 115 % (ref 60–150)

## 2016-07-02 ENCOUNTER — Ambulatory Visit: Admission: RE | Admit: 2016-07-02 | Payer: 59 | Source: Ambulatory Visit

## 2016-07-02 ENCOUNTER — Ambulatory Visit
Admission: RE | Admit: 2016-07-02 | Discharge: 2016-07-02 | Disposition: A | Discharge status: Home / Self Care | Payer: 59 | Source: Ambulatory Visit | Attending: Hematology and Oncology | Admitting: Hematology and Oncology

## 2016-07-02 ENCOUNTER — Ambulatory Visit (INDEPENDENT_AMBULATORY_CARE_PROVIDER_SITE_OTHER): Payer: 59 | Admitting: Vascular Surgery

## 2016-07-02 DIAGNOSIS — R918 Other nonspecific abnormal finding of lung field: Secondary | ICD-10-CM | POA: Diagnosis not present

## 2016-07-02 DIAGNOSIS — J9811 Atelectasis: Secondary | ICD-10-CM | POA: Insufficient documentation

## 2016-07-02 DIAGNOSIS — I82B12 Acute embolism and thrombosis of left subclavian vein: Secondary | ICD-10-CM | POA: Insufficient documentation

## 2016-07-02 DIAGNOSIS — R079 Chest pain, unspecified: Secondary | ICD-10-CM | POA: Diagnosis not present

## 2016-07-02 LAB — DRVVT CONFIRM: dRVVT Confirm: 1.9 ratio — ABNORMAL HIGH (ref 0.8–1.2)

## 2016-07-02 LAB — DRVVT MIX: dRVVT Mix: 79.4 s — ABNORMAL HIGH (ref 0.0–47.0)

## 2016-07-02 LAB — CARDIOLIPIN ANTIBODIES, IGG, IGM, IGA
Anticardiolipin IgA: <9 APL U/mL (ref 0–11)
Anticardiolipin IgG: <9 GPL U/mL (ref 0–14)
Anticardiolipin IgM: <9 MPL U/mL (ref 0–12)

## 2016-07-02 LAB — LUPUS ANTICOAGULANT PANEL
DRVVT: 137.6 s — ABNORMAL HIGH (ref 0.0–47.0)
PTT Lupus Anticoagulant: 50.3 s (ref 0.0–51.9)

## 2016-07-02 MED ORDER — IOPAMIDOL (ISOVUE-370) INJECTION 76%
75.0000 mL | Freq: Once | INTRAVENOUS | Status: AC | PRN
  Administered 2016-07-02: 75 mL via INTRAVENOUS

## 2016-07-05 ENCOUNTER — Ambulatory Visit (INDEPENDENT_AMBULATORY_CARE_PROVIDER_SITE_OTHER): Payer: Self-pay | Admitting: Vascular Surgery

## 2016-07-05 DIAGNOSIS — M898X1 Other specified disorders of bone, shoulder: Secondary | ICD-10-CM | POA: Insufficient documentation

## 2016-07-05 DIAGNOSIS — R0600 Dyspnea, unspecified: Secondary | ICD-10-CM | POA: Insufficient documentation

## 2016-07-05 LAB — FACTOR 5 LEIDEN

## 2016-07-05 LAB — PROTHROMBIN GENE MUTATION

## 2016-07-08 ENCOUNTER — Ambulatory Visit (INDEPENDENT_AMBULATORY_CARE_PROVIDER_SITE_OTHER): Payer: 59 | Admitting: Vascular Surgery

## 2016-07-08 ENCOUNTER — Ambulatory Visit: Payer: 59

## 2016-07-08 ENCOUNTER — Encounter (INDEPENDENT_AMBULATORY_CARE_PROVIDER_SITE_OTHER): Payer: Self-pay | Admitting: Vascular Surgery

## 2016-07-08 ENCOUNTER — Encounter: Payer: Self-pay | Admitting: Hematology and Oncology

## 2016-07-08 ENCOUNTER — Inpatient Hospital Stay: Payer: 59 | Attending: Hematology and Oncology | Admitting: Hematology and Oncology

## 2016-07-08 VITALS — BP 118/76 | HR 62 | Resp 18 | Ht 71.0 in | Wt 251.0 lb

## 2016-07-08 VITALS — BP 113/72 | HR 62 | Temp 94.3°F | Resp 18 | Wt 252.6 lb

## 2016-07-08 DIAGNOSIS — I82B12 Acute embolism and thrombosis of left subclavian vein: Secondary | ICD-10-CM | POA: Diagnosis present

## 2016-07-08 DIAGNOSIS — G540 Brachial plexus disorders: Secondary | ICD-10-CM | POA: Insufficient documentation

## 2016-07-08 DIAGNOSIS — Z86718 Personal history of other venous thrombosis and embolism: Secondary | ICD-10-CM | POA: Diagnosis not present

## 2016-07-08 DIAGNOSIS — Z7901 Long term (current) use of anticoagulants: Secondary | ICD-10-CM | POA: Insufficient documentation

## 2016-07-08 DIAGNOSIS — R918 Other nonspecific abnormal finding of lung field: Secondary | ICD-10-CM | POA: Diagnosis not present

## 2016-07-08 DIAGNOSIS — I82409 Acute embolism and thrombosis of unspecified deep veins of unspecified lower extremity: Secondary | ICD-10-CM | POA: Insufficient documentation

## 2016-07-08 DIAGNOSIS — Z79899 Other long term (current) drug therapy: Secondary | ICD-10-CM | POA: Diagnosis not present

## 2016-07-08 DIAGNOSIS — R760 Raised antibody titer: Secondary | ICD-10-CM

## 2016-07-08 DIAGNOSIS — I82511 Chronic embolism and thrombosis of right femoral vein: Secondary | ICD-10-CM

## 2016-07-08 DIAGNOSIS — M898X1 Other specified disorders of bone, shoulder: Secondary | ICD-10-CM

## 2016-07-08 MED ORDER — ENOXAPARIN SODIUM 150 MG/ML ~~LOC~~ SOLN: 1.0000 mg/kg | Freq: Two times a day (BID) | SUBCUTANEOUS | 0 refills | Status: DC

## 2016-07-08 NOTE — Progress Notes (Signed)
[facility] day:  07/08/2016  Chief Complaint: Justin [name] is a 51 y.o. male with recurrent thrombosis who is seen for review of work-up and discussion regarding direction of therapy.  HPI:  The patient was last seen in the hematology clinic on 06/29/2016.  At that time, he was seen for initial consultation.  He had recently been diagnosed with his 4th clot.  His first 2 clots were in his right upper extremity.  His 3rd clot was in his right calf.  His most recent clot was in his left upper extremity.  There were no precipitating events.  His 4th clot occurred on Xarelto.  He was switched to Lovenox.  He underwent a hypercoagulable work-up.  CBC with diff was normal.  CMP revealed a creatinine of 1.34.  Lupus anticoagulant panel was positive on Xarelto and Lovenox.  Factor V Leiden and prothrombin gene mutation were negative.  Anticardiolipin antibodies and beta-2 glycoprotein antibodies were negative.  Protein C antigen was 99%.  Protein C activity was 116%.  Protein S antigen was 115%.  Protein S activity was 136%.  ATIII antigen was 81%.  ATIII activity was 130%.  He underwent a chest CT angiogram on 07/02/2016.  There was no evidence of pulmonary embolism.  There were small pulmonary nodules, largest 3 mm, of unclear significance.  Symptomatically, his stomach pains are better.  He has "twinges of pain" in the middle of his chest that come and go.  He has "knots in his belly" where he does the injections.   Past Medical History:  Diagnosis Date  . Arm vein blood clot    right  . DVT (deep venous thrombosis) ([place]) 2012; January 2016   History of right arm; now right lower shotty  . GERD (gastroesophageal reflux disease)   . Vascular thoracic outlet syndrome    Right     Past Surgical History:  Procedure Laterality Date  . CAROTID ANGIOGRAM    . KNEE CARTILAGE SURGERY     Performed for cracked patella and torn meniscus  . Right upper  extremity thrombectomy  2012  . SCALENOTOMY W/O RESECTION CERVICAL RIB  2012   4 right-sided thoracic outlet syndrome    Family History  Problem Relation Age of Onset  . Heart disease Father   . Breast cancer Father   . Diabetes Father   . Heart attack Father   . Cancer Maternal Grandmother   . Leukemia Maternal Grandfather     Social History:  reports that he has never smoked. He has never used smokeless tobacco. He reports that he does not drink alcohol or use drugs.  He previously smoked 1/2 pack a day for 10 years.  He stopped smoking in his 14s.  He lives in [place].  He is a Glass blower/designer.  He denies any exposure to radiation or toxins.  He has a son.  The patient is accompanied by his wife, [name], today.  Allergies: No Known Allergies  Current Medications: Current Outpatient Prescriptions  Medication Sig Dispense Refill  . enoxaparin (LOVENOX) 150 MG/ML injection Inject 0.74 mLs (110 mg total) into the skin 2 (two) times daily. 60 Syringe 0  . topiramate (TOPAMAX) 50 MG tablet Take 50 mg by mouth daily.    Marland Kitchen azithromycin (ZITHROMAX) 250 MG tablet Take 2 tablets (583m) by mouth on Day 1. Take 1 tablet (2521m by mouth on Days 2-5.    . Marland KitchenYDROcodone-homatropine (HYCODAN) 5-1.5 MG/5ML syrup Take by  mouth.    . hydrOXYzine (ATARAX/VISTARIL) 25 MG tablet Take 1 tablet (25 mg total) by mouth 3 (three) times daily as needed. (Patient not taking: Reported on 06/29/2016) 30 tablet 0  . SUMAtriptan (IMITREX) 100 MG tablet Take 100 mg by mouth.    Alveda Reasons 20 MG TABS tablet Take 20 mg by mouth daily with supper.      No current facility-administered medications for this visit.     Review of Systems:  GENERAL:  Feels "alright".  No fevers or sweats.  Weight gain of 3 pounds. PERFORMANCE STATUS (ECOG):  1 HEENT:  Visual changes since last year (see HPI).  No runny nose, sore throat, mouth sores or tenderness. Lungs: "Little shortness of breath".  No cough.  No hemoptysis. Cardiac:  No  chest pain, palpitations, orthopnea, or PND. GI:  No nausea, vomiting, diarrhea, constipation, melena or hematochezia. GU:  No urgency, frequency, dysuria, or hematuria. Musculoskeletal:  Shoulder pain, improved.  Left scapula pain.  Pain in chest area,  No muscle tenderness. Extremities:  No pain or swelling. Skin:  Knots on belly where he does Lovenox injections.  Neuro:  No headache, numbness or weakness, balance or coordination issues. Endocrine:  No diabetes, thyroid issues, hot flashes or night sweats. Psych:  No mood changes, depression or anxiety. Pain:  No focal pain. Review of systems:  All other systems reviewed and found to be negative.  Physical Exam: Blood pressure 113/72, pulse 62, temperature (!) 94.3 F (34.6 C), temperature source Tympanic, resp. rate 18, weight 252 lb 10.4 oz (114.6 kg). GENERAL:  Well developed, well nourished, muscular gentleman sitting comfortably in the exam room in no acute distress. MENTAL STATUS:  Alert and oriented to person, place and time. HEAD: Wearing a [facility] cap.  Alopecia.  [name].  Normocephalic, atraumatic, face symmetric, no Cushingoid features. EYES:  Glasses.  Brown eyes.  No conjunctivitis or scleral icterus. RESPIRATORY:  Clear to auscultation without rales, wheezes or rhonchi. CARDIOVASCULAR:  Regular rate and rhythm without murmur, rub or gallop. BACK:  Tender left scapula. EXTREMITIES: Mild right upper extremity edema.  No skin discoloration or tenderness.  No palpable cords. NEUROLOGICAL: Unremarkable. PSYCH:  Appropriate.   No visits with results within 3 Day(s) from this visit.  Latest known visit with results is:  Clinical Support on 06/29/2016  Component Date Value Ref Range Status  . WBC 06/29/2016 8.9  3.8 - 10.6 K/uL Final  . RBC 06/29/2016 4.78  4.40 - 5.90 MIL/uL Final  . Hemoglobin 06/29/2016 14.7  13.0 - 18.0 g/dL Final  . HCT 06/29/2016 43.5  40.0 - 52.0 % Final  . MCV 06/29/2016 90.9  80.0 - 100.0 fL Final   . MCH 06/29/2016 30.7  26.0 - 34.0 pg Final  . MCHC 06/29/2016 33.8  32.0 - 36.0 g/dL Final  . RDW 06/29/2016 13.3  11.5 - 14.5 % Final  . Platelets 06/29/2016 248  150 - 440 K/uL Final  . Neutrophils Relative % 06/29/2016 59  % Final  . Neutro Abs 06/29/2016 5.3  1.4 - 6.5 K/uL Final  . Lymphocytes Relative 06/29/2016 27  % Final  . Lymphs Abs 06/29/2016 2.4  1.0 - 3.6 K/uL Final  . Monocytes Relative 06/29/2016 10  % Final  . Monocytes Absolute 06/29/2016 0.9  0.2 - 1.0 K/uL Final  . Eosinophils Relative 06/29/2016 3  % Final  . Eosinophils Absolute 06/29/2016 0.2  0 - 0.7 K/uL Final  . Basophils Relative 06/29/2016 1  %  Final  . Basophils Absolute 06/29/2016 0.1  0 - 0.1 K/uL Final  . Sodium 06/29/2016 136  135 - 145 mmol/L Final  . Potassium 06/29/2016 3.7  3.5 - 5.1 mmol/L Final  . Chloride 06/29/2016 109  101 - 111 mmol/L Final  . CO2 06/29/2016 23  22 - 32 mmol/L Final  . Glucose, Bld 06/29/2016 105* 65 - 99 mg/dL Final  . BUN 06/29/2016 14  6 - 20 mg/dL Final  . Creatinine, Ser 06/29/2016 1.34* 0.61 - 1.24 mg/dL Final  . Calcium 06/29/2016 9.0  8.9 - 10.3 mg/dL Final  . Total Protein 06/29/2016 7.5  6.5 - 8.1 g/dL Final  . Albumin 06/29/2016 3.8  3.5 - 5.0 g/dL Final  . AST 06/29/2016 30  15 - 41 U/L Final  . ALT 06/29/2016 59  17 - 63 U/L Final  . Alkaline Phosphatase 06/29/2016 76  38 - 126 U/L Final  . Total Bilirubin 06/29/2016 0.3  0.3 - 1.2 mg/dL Final  . GFR calc non Af Amer 06/29/2016 60* >60 mL/min Final  . GFR calc Af Amer 06/29/2016 >60  >60 mL/min Final   Comment: (NOTE) The eGFR has been calculated using the CKD EPI equation. This calculation has not been validated in all clinical situations. eGFR's persistently <60 mL/min signify possible Chronic Kidney Disease.   . Anion gap 06/29/2016 4* 5 - 15 Final  . Anticardiolipin IgG 06/29/2016 <9  0 - 14 GPL U/mL Final   Comment: (NOTE)                          Negative:              <15                           Indeterminate:     15 - 20                          Low-Med Positive: >20 - 80                          High Positive:         >80   . Anticardiolipin IgM 06/29/2016 <9  0 - 12 MPL U/mL Final   Comment: (NOTE)                          Negative:              <13                          Indeterminate:     13 - 20                          Low-Med Positive: >20 - 80                          High Positive:         >80   . Anticardiolipin IgA 06/29/2016 <9  0 - 11 APL U/mL Final   Comment: (NOTE)                          Negative:              <  12                          Indeterminate:     12 - 20                          Low-Med Positive: >20 - 80                          High Positive:         >80 Performed At: [facility] [place], [place] [id number] Lindon Romp MD VW:[id number]   . Recommendations-F5LEID: 06/29/2016 Comment   Final   Comment: (NOTE) Result:  Negative (no mutation found) Factor V Leiden is a specific mutation (R506Q) in the factor V gene that is associated with an increased risk of venous thrombosis. Factor V Leiden is more resistant to inactivation by activated protein C.  As a result, factor V persists in the circulation leading to a mild hyper- coagulable state.  The Leiden mutation accounts for 90% - 95% of APC resistance.  Factor V Leiden has been reported in patients with deep vein thrombosis, pulmonary embolus, central retinal vein occlusion, cerebral sinus thrombosis and hepatic vein thrombosis. Other risk factors to be considered in the workup for venous thrombosis include the G20210A mutation in the factor II (prothrombin) gene, protein S and C deficiency, and antithrombin deficiencies. Anticardiolipin antibody and lupus anticoagulant analysis may be appropriate for certain patients, as well as homocysteine levels. Contact your local [facility] for information on how to order additi                          onal testing if  desired.   . Comment 06/29/2016 Comment   Corrected   Comment: (NOTE) **Genetic counselors are available for health care providers to**  discuss results at [phone] [phone]). Methodology: DNA analysis of the Factor V gene was performed by allele-specific PCR. The diagnostic sensitivity and specificity is >99% for both. Molecular-based testing is highly accurate, but as in any laboratory test, diagnostic errors may occur. All test results must be combined with clinical information for the most accurate interpretation. This test was developed and its performance characteristics determined by [facility]. It has not been cleared or approved by the Food and Drug Administration. References: Voelkerding K (1996).  Clin Lab Med [phone]. Allison Quarry, PhD, [facility] Ruben Reason, PhD, [facility] Jens Som, PhD, [facility] Annetta Maw, M.S., PhD, [facility] Alfredo Bach, PhD, [facility] Norva Riffle, PhD, [facility] Earlean Polka PhD, [facility] Performed At: [facility] [place], [place] [id number] Rema Fendt                          D HQ:[id number]   . Recommendations-PTGENE: 06/29/2016 Comment   Final   Comment: (NOTE) NEGATIVE No mutation identified. Comment: A point mutation (G20210A) in the factor II (prothrombin) gene is the second most common cause of inherited thrombophilia. The incidence of this mutation in the [place] Caucasian population is about 2% and in the [place] American population it is approximately 0.5%. This mutation is rare in the [place] and Native American population. Being heterozygous for a prothrombin mutation increases the risk for developing venous thrombosis about 2 to 3 times above the general population risk. Being homozygous for the prothrombin gene  mutation increases the relative risk for venous thrombosis further, although it is not yet known how much further the risk is increased. In women heterozygous for the prothrombin gene  mutation, the use of estrogen containing oral contraceptives increases the relative risk of venous thrombosis about 16 times and the risk of developing cerebral thrombosis is also significantly increased. In pregnancy the pr                          othrombin gene mutation increases risk for venous thrombosis and may increase risk for stillbirth, placental abruption, pre-eclampsia and fetal growth restriction. If the patient possesses two or more congenital or acquired thrombophilic risk factors, the risk for thrombosis may rise to more than the sum of the risk ratios for the individual mutations. This assay detects only the prothrombin G20210A mutation and does not measure genetic abnormalities elsewhere in the genome. Other thrombotic risk factors may be pursued through systematic clinical laboratory analysis. These factors include the R506Q (Leiden) mutation in the Factor V gene, plasma homocysteine levels, as well as testing for deficiencies of antithrombin III, protein C and protein S.   . Additional Information 06/29/2016 Comment   Final   Comment: (NOTE) Genetic Counselors are available for health care providers to discuss results at [phone] [phone]). Methodology: DNA analysis of the Factor II gene was performed by PCR amplification followed by restriction analysis. The diagnostic sensitivity is >99% for both. All the tests must be combined with clinical information for the most accurate interpretation. Molecular-based testing is highly accurate, but as in any laboratory test, diagnostic errors may occur. This test was developed and its performance characteristics determined by [facility]. It has not been cleared or approved by the Food and Drug Administration. Poort SR, et al. Blood. 1996; 41:5830-9407. Varga EA. Circulation. 2004; 680:S81-J03. Mervin Hack, et [place]; 19:700-703. Allison Quarry, PhD, [facility] Ruben Reason, PhD,  [facility] Jens Som, PhD, [facility] Annetta Maw, M.S., PhD, [facility] Alfredo Bach, PhD, [facility] Norva Riffle, PhD, [facility] Earlean Polka,                           PhD, [facility] Performed At: [facility] [address] [place], [place] [id number] Nechama Guard MD TW:[id number]   . Antithrombin Activity 06/29/2016 130  75 - 135 % Final   Comment: (NOTE) Direct thrombin inhibitor anticoagulants such as rivaroxaban, apixaban and edoxaban will lead to spuriously elevated antithrombin activity levels possibly masking a deficiency.   . AT III AG PPP IMM-ACNC 06/29/2016 81  72 - 124 % Final   Comment: (NOTE) This test was developed and its performance characteristics determined by [facility]. It has not been cleared or approved by the Food and Drug Administration. Performed At: [facility] [place], [place] [id number] Lindon Romp MD YB:[id number]   . Beta-2 Glyco I IgG 06/29/2016 <9  0 - 20 GPI IgG units Final   Comment: (NOTE) The reference interval reflects a 3SD or 99th percentile interval, which is thought to represent a potentially clinically significant result in accordance with the International Consensus Statement on the classification criteria for definitive antiphospholipid syndrome (APS). J Thromb Haem 2006;4:295-306.   . Beta-2-Glycoprotein I IgM 06/29/2016 <9  0 - 32 GPI IgM units Final   Comment: (NOTE) The reference interval reflects a 3SD or 99th percentile interval, which is thought to represent a potentially  clinically significant result in accordance with the International Consensus Statement on the classification criteria for definitive antiphospholipid syndrome (APS). J Thromb Haem 2006;4:295-306. Performed At: [facility] [address] [place], [place] [id number] Mila Homer MD PT:[id number]   . Beta-2-Glycoprotein I IgA 06/29/2016 <9  0 - 25 GPI IgA units Final   Comment: (NOTE) The reference interval  reflects a 3SD or 99th percentile interval, which is thought to represent a potentially clinically significant result in accordance with the International Consensus Statement on the classification criteria for definitive antiphospholipid syndrome (APS). J Thromb Haem 2006;4:295-306.   Marland Kitchen Protein C, Total 06/29/2016 99  60 - 150 % Final   Comment: (NOTE) Performed At: [facility] [address] [place], [place] [id number] Mila Homer MD GV:[id number]   . Protein S Activity 06/29/2016 136  63 - 140 % Final   Comment: (NOTE) Performed At: [facility] [address] [place], [place] [id number] Mila Homer MD [id number]   . Protein S Ag, Total 06/29/2016 115  60 - 150 % Final   Comment: (NOTE) This test was developed and its performance characteristics determined by [facility]. It has not been cleared or approved by the Food and Drug Administration. Performed At: [facility] [address] [place], [place] [id number] Mila Homer MD SW:[id number]   . Protein C Activity 06/29/2016 116  73 - 180 % Final   Comment: (NOTE) Performed At: [facility] [address] [place], [place] [id number] Mila Homer MD TI:[id number]   . PTT Lupus Anticoagulant 06/29/2016 50.3  0.0 - 51.9 sec Final   Comment: (NOTE) Additional testing confirms the presence of heparin in the test sample. Results obtained after heparin neutralization.   Marland Kitchen DRVVT 06/29/2016 137.6* 0.0 - 47.0 sec Final  . Lupus Anticoag Interp 06/29/2016 Comment:   Corrected   Comment: (NOTE) Results are consistent with the presence of a lupus anticoagulant. NOTE: Only persistent lupus anticoagulants are thought to be of clinical significance. For this reason, repeat testing in 12 or more weeks after an initial positive result should be considered to confirm or refute the presence of a lupus anticoagulant, depending on clinical presentation. Results of lupus anticoagulant tests  may be falsely positive in the presence of certain anticoagulant therapies. Performed At: [facility] [address] [place], [place] [id number] Mila Homer MD QA:[id number]   . dRVVT Mix 06/29/2016 79.4* 0.0 - 47.0 sec Final   Comment: (NOTE) Performed At: [facility] [address] [place], [place] [id number] Mila Homer MD TO:[id number]   . dRVVT Confirm 06/29/2016 1.9* 0.8 - 1.2 ratio Final   Comment: (NOTE) Performed At: [facility] [address] [place], [place] [id number] Mila Homer MD SK:[id number]     Assessment:  BRAYTEN KOMAR is a 51 y.o. male with recurrent thrombosis x 4.  He developed a right upper extremity clot in 2010. He underwent thrombolysis.  He was on Coumadin x 6 months.  He developed a recurrent clot in the right upper extremity in 2011.  He underwent thoracic outlet decompression.  He was on Coumadin for 6-9 months.  He developed a right lower extremity DVT in 2016.  He was started on Xarelto.  He developed a left upper extremity DVT on 06/07/2016 while on Xarelto.  Duplex revealed a near occlusive DVT in the lateral aspect of the left subclavian vein and nonocclusive thrombus extending into the adjacent axillary vein.  He was started on Lovenox.  Hypercoagulable  work-up on 06/29/2016 revealed the following normal studies:  CBC with diff, Factor V Leiden, prothrombin gene mutation, protein C antigen and activity, protein S antigen and activity, ATIII antigen and activity, anticardiolipin antibodies and beta-2 glycoprotein antibodies.  Lupus anticoagulant panel was positive on Xarelto and Lovenox.  Chest CT angiogram on 07/02/2016 revealed no evidence of pulmonary embolism.  There were small pulmonary nodules, largest 3 mm, of unclear significance.  Symptomatically, he notes decreased pain in his left side and arm.  His left arm aches.  He is still a little short of breath.  He denies any fevers, sweats or weight  loss.  Left scapula is tender.  Plan: 1.  Review hypercoagulable work-up.  Unclear significance of + lupus anticoagulant on Xarelto and Lovenox.  Patient requires lifelong anticoagulation secondary to recurrent clot. 2.  Review chest CT angiogram on 07/02/2016.  No evidence of pulmonary embolism.  Tiny (3 mm) lung nodules of unclear significance.  Will need follow-up chest CT in 6 months. 3.  Discuss bone scan given ongoing pain localized to bone (shoulder area, chest/ribs) secondary to recurrent clots and tiny lung nodules. 4.  Schedule bone scan 5.  Continue Lovenox. 6.  RTC in 1 month for MD assessment and labs (CBC with diff, CMP), and review of bone scan.   Lequita Asal, MD  07/08/2016, 12:00 PM

## 2016-07-08 NOTE — Progress Notes (Signed)
Patient states he feels SOB at times.  Also has noticed that he has twinges of pain in his mid chest area. Did not notice any of this until he had the blood clots. Patient states his abdomen is really bruised from the lovenox.  Also states there are knots in his stomach where the shots were given.

## 2016-07-08 NOTE — Progress Notes (Signed)
MRN : [id number]  Justin [name] is a 51 y.o. ([birth year]) male who presents with chief complaint of  Chief Complaint  Patient presents with  . Follow-up  .  History of Present Illness: The patient presents to the office for evaluation of recurrent DVT.  DVT was identified at [facility] by Duplex ultrasound in 2011 in the right arm at the subclavian level.  He had thrombolysis and a first rib resection which was complicated with a pneumothorax.  Then several years later he developed a right leg DVT and was put on Xarelto.  Recently he developed a left arm DVT and so has been switch to Lovenox.  The initial symptoms were pain and swelling in the left upper extremity.  The patient notes the arm continues to be very painful with dependency and swells quite a bite.  Symptoms are much better with elevation.  The patient notes minimal edema in the morning which steadily worsens throughout the day.    The patient has not been using compression therapy at this point.  No SOB or pleuritic chest pains.  No cough or hemoptysis.  No blood per rectum or blood in any sputum.  No excessive bruising per the patient.  He has had a hypercoagulable work up which is + for lupus anticoagulant   Current Meds  Medication Sig  . azithromycin (ZITHROMAX) 250 MG tablet Take 2 tablets (500mg ) by mouth on Day 1. Take 1 tablet (250mg ) by mouth on Days 2-5.  . enoxaparin (LOVENOX) 150 MG/ML injection Inject 0.74 mLs (110 mg total) into the skin 2 (two) times daily.  Marland Kitchen HYDROcodone-homatropine (HYCODAN) 5-1.5 MG/5ML syrup Take by mouth.  . hydrOXYzine (ATARAX/VISTARIL) 25 MG tablet Take 1 tablet (25 mg total) by mouth 3 (three) times daily as needed.  . SUMAtriptan (IMITREX) 100 MG tablet Take 100 mg by mouth.  . topiramate (TOPAMAX) 50 MG tablet Take 50 mg by mouth daily.  Alveda Reasons 20 MG TABS tablet Take 20 mg by mouth daily with supper.     Past Medical History:  Diagnosis Date  . Arm vein blood clot    right   . DVT (deep venous thrombosis) ([place]) 2012; January 2016   History of right arm; now right lower shotty  . GERD (gastroesophageal reflux disease)   . Vascular thoracic outlet syndrome    Right     Past Surgical History:  Procedure Laterality Date  . CAROTID ANGIOGRAM    . KNEE CARTILAGE SURGERY     Performed for cracked patella and torn meniscus  . Right upper extremity thrombectomy  2012  . SCALENOTOMY W/O RESECTION CERVICAL RIB  2012   4 right-sided thoracic outlet syndrome    Social History Social History  Substance Use Topics  . Smoking status: Never Smoker  . Smokeless tobacco: Never Used  . Alcohol use No    Family History Family History  Problem Relation Age of Onset  . Heart disease Father   . Breast cancer Father   . Diabetes Father   . Heart attack Father   . Cancer Maternal Grandmother   . Leukemia Maternal Grandfather   No family history of bleeding/clotting disorders, porphyria or autoimmune disease   No Known Allergies   REVIEW OF SYSTEMS (Negative unless checked)  Constitutional: [] Weight loss  [] Fever  [] Chills Cardiac: [] Chest pain   [] Chest pressure   [] Palpitations   [] Shortness of breath when laying flat   [] Shortness of breath with exertion. Vascular:  [] Pain in legs  with walking   [] Pain in legs at rest  [x] History of DVT   [] Phlebitis   [x] Swelling in legs   [] Varicose veins   [] Non-healing ulcers Pulmonary:   [] Uses home oxygen   [] Productive cough   [] Hemoptysis   [] Wheeze  [] COPD   [] Asthma Neurologic:  [] Dizziness   [] Seizures   [] History of stroke   [] History of TIA  [] Aphasia   [] Vissual changes   [] Weakness or numbness in arm   [] Weakness or numbness in leg Musculoskeletal:   [] Joint swelling   [] Joint pain   [] Low back pain Hematologic:  [] Easy bruising  [] Easy bleeding   [x] Hypercoagulable state   [] Anemic Gastrointestinal:  [] Diarrhea   [] Vomiting  [] Gastroesophageal reflux/heartburn   [] Difficulty swallowing. Genitourinary:   [] Chronic kidney disease   [] Difficult urination  [] Frequent urination   [] Blood in urine Skin:  [] Rashes   [] Ulcers  Psychological:  [] History of anxiety   []  History of major depression.  Physical Examination  Vitals:   07/08/16 1307  BP: 118/76  Pulse: 62  Resp: 18  Weight: 251 lb (113.9 kg)  Height: 5\' 11"  (1.803 m)   Body mass index is 35.01 kg/m. Gen: WD/WN, NAD Head: [place]/AT, No temporalis wasting.  Ear/Nose/Throat: Hearing grossly intact, nares w/o erythema or drainage, poor dentition Eyes: PER, EOMI, sclera nonicteric.  Neck: Supple, no masses.  No bruit or JVD.  Pulmonary:  Good air movement, clear to auscultation bilaterally, no use of accessory muscles.  Cardiac: RRR, normal S1, S2, no Murmurs. Vascular:   2+ edema of the right leg at the ankle, 1+ edema of the left arm; no ulcers Vessel Right Left  Radial Palpable Palpable  Ulnar Palpable Palpable  Brachial Palpable Palpable  Carotid Palpable Palpable  Femoral Palpable Palpable  Popliteal Palpable Palpable  PT Palpable Palpable  DP Palpable Palpable   Gastrointestinal: soft, non-distended. No guarding/no peritoneal signs.  Musculoskeletal: M/S 5/5 throughout.  No deformity or atrophy.  Neurologic: CN 2-12 intact. Pain and light touch intact in extremities.  Symmetrical.  Speech is fluent. Motor exam as listed above. Psychiatric: Judgment intact, Mood & affect appropriate for pt's clinical situation. Dermatologic: No rashes or ulcers noted.  No changes consistent with cellulitis. Lymph : No Cervical lymphadenopathy, no lichenification or skin changes of chronic lymphedema.  CBC Lab Results  Component Value Date   WBC 8.9 06/29/2016   HGB 14.7 06/29/2016   HCT 43.5 06/29/2016   MCV 90.9 06/29/2016   PLT 248 06/29/2016    BMET    Component Value Date/Time   NA 136 06/29/2016 1220   NA 138 09/02/2014 1257   K 3.7 06/29/2016 1220   K 4.2 09/02/2014 1257   CL 109 06/29/2016 1220   CL 102 09/02/2014 1257    CO2 23 06/29/2016 1220   CO2 28 09/02/2014 1257   GLUCOSE 105 (H) 06/29/2016 1220   GLUCOSE 90 09/02/2014 1257   BUN 14 06/29/2016 1220   BUN 16 09/02/2014 1257   CREATININE 1.34 (H) 06/29/2016 1220   CREATININE 1.22 09/02/2014 1257   CALCIUM 9.0 06/29/2016 1220   CALCIUM 9.2 09/02/2014 1257   GFRNONAA 60 (L) 06/29/2016 1220   GFRNONAA >60 09/02/2014 1257   GFRAA >60 06/29/2016 1220   GFRAA >60 09/02/2014 1257   Estimated Creatinine Clearance: 83.7 mL/min (by C-G formula based on SCr of 1.34 mg/dL (H)).  COAG Lab Results  Component Value Date   INR 1.3 (H) 06/21/2016   INR 1.34 06/07/2016   INR 1.0  09/02/2014    Radiology Ct Angio Chest Pe W Or Wo Contrast  Result Date: 07/02/2016 CLINICAL DATA:  Pt with increased chest pain on left side of chest and into back. Pt with hx of blood clots. Pt states he is working with Dr. Mike Gip to find the cause. Pt with hx of surgery to right side of chest to remove part of a rib. EXAM: CT ANGIOGRAPHY CHEST WITH CONTRAST TECHNIQUE: Multidetector CT imaging of the chest was performed using the standard protocol during bolus administration of intravenous contrast. Multiplanar CT image reconstructions and MIPs were obtained to evaluate the vascular anatomy. CONTRAST:  75 mL of Isovue 370 intravenous contrast COMPARISON:  Chest radiograph, 11/11/2010 FINDINGS: Cardiovascular: Satisfactory opacification of the pulmonary arteries to the segmental level. No evidence of pulmonary embolism. Normal heart size. No pericardial effusion. The great vessels normal in caliber. No aortic dissection. No significant aortic atherosclerotic plaque. Mediastinum/Nodes: No enlarged mediastinal, hilar, or axillary lymph nodes. Thyroid gland, trachea, and esophagus demonstrate no significant findings. Lungs/Pleura: No evidence of pneumonia or pulmonary edema. Linear lung opacities noted in the left lower lobe associated with mild volume loss, consistent with atelectasis.  There several small lung nodules, largest in the right upper lobe, image 33, series 6, measuring 3 mm. There is a tiny nodule seen in the subpleural right upper lobe adjacent to this, image 40. There is a small, 2-3 mm, nodule in the superior segment of the right lower lobe, image 51. No pleural effusion.  No pneumothorax. Upper Abdomen: Unremarkable. Musculoskeletal: Unremarkable. Review of the MIP images confirms the above findings. IMPRESSION: 1. No evidence of a pulmonary embolus. 2. No acute findings. 3. Mild left lower lobe subsegmental atelectasis. 4. Small pulmonary nodules, largest measuring 3 mm. No follow-up needed if patient is low-risk (and has no known or suspected primary neoplasm). Non-contrast chest CT can be considered in 12 months if patient is high-risk. This recommendation follows the consensus statement: Guidelines for Management of Incidental Pulmonary Nodules Detected on CT Images: From the Fleischner Society 2017; Radiology 2017; 284:228-243. Electronically Signed   By: Lajean Manes M.D.   On: 07/02/2016 11:14    Assessment/Plan 1. Left subclavian vein thrombosis (HCC) Recommend:   No surgery or intervention at this point in time.  IVC filter is not indicated at present.  Patient's duplex ultrasound of the venous system shows DVT left arm  The patient is on anticoagulation with Lovenox as an initial solution since he appears to have failed Xarelto.  Given his + hypercoagulable state and his multiple DVT's I would agree with lifelong anticoagulation.  Perhaps Pradaxa would be a good oral agent as it is different from [place], [place] and [place].  Otherwise, I would consider Coumadin with the plan to get the INR up to 3.0-4.0.   Elevation was stressed, use of a recliner was discussed.  I have had a long discussion with the patient regarding DVT and post phlebitic changes such as swelling and why it  causes symptoms such as pain.  The patient will wear graduated compression  stockings class 1 (20-30 mmHg), beginning after three full days of anticoagulation, on a daily basis a prescription was given. The patient will  beginning wearing the stockings first thing in the morning and removing them in the evening. The patient is instructed specifically not to sleep in the stockings.  In addition, behavioral modification including elevation during the day and avoidance of prolonged dependency will be initiated.    The patient will continue  anticoagulation for now as there have not been any problems or complications at this point.    2. Lupus anticoagulant positive See #1  3. Chronic deep vein thrombosis (DVT) of femoral vein of right lower extremity (HCC) See #1  4. Thoracic outlet syndrome I have discussed that there is a strong likelyhood that he has TOS of the left but given the fact that he does not have any severe symptoms of the left arm at this time I do not recommend surgery.  I also informed him that since his first rib resection on the right on of the surgeons at [facility] in this and I would ask that he go to see Dr Philomena Course if need be    Hortencia Pilar, MD  07/08/2016 9:19 PM

## 2016-07-15 ENCOUNTER — Ambulatory Visit: Payer: 59

## 2016-07-19 ENCOUNTER — Other Ambulatory Visit: Payer: 59

## 2016-07-19 ENCOUNTER — Ambulatory Visit: Payer: 59

## 2016-07-20 ENCOUNTER — Emergency Department: Payer: 59

## 2016-07-20 ENCOUNTER — Ambulatory Visit: Payer: 59 | Admitting: Family Medicine

## 2016-07-20 ENCOUNTER — Emergency Department
Admission: EM | Admit: 2016-07-20 | Discharge: 2016-07-20 | Disposition: A | Discharge status: Home / Self Care | Payer: 59 | Attending: Emergency Medicine | Admitting: Emergency Medicine

## 2016-07-20 ENCOUNTER — Encounter: Payer: Self-pay | Admitting: Emergency Medicine

## 2016-07-20 DIAGNOSIS — R079 Chest pain, unspecified: Secondary | ICD-10-CM | POA: Diagnosis not present

## 2016-07-20 DIAGNOSIS — R0602 Shortness of breath: Secondary | ICD-10-CM | POA: Diagnosis not present

## 2016-07-20 DIAGNOSIS — R109 Unspecified abdominal pain: Secondary | ICD-10-CM

## 2016-07-20 DIAGNOSIS — Z79899 Other long term (current) drug therapy: Secondary | ICD-10-CM | POA: Insufficient documentation

## 2016-07-20 DIAGNOSIS — R935 Abnormal findings on diagnostic imaging of other abdominal regions, including retroperitoneum: Secondary | ICD-10-CM | POA: Diagnosis not present

## 2016-07-20 DIAGNOSIS — R0781 Pleurodynia: Secondary | ICD-10-CM | POA: Insufficient documentation

## 2016-07-20 LAB — DIFFERENTIAL
Basophils Absolute: 0 10*3/uL (ref 0–0.1)
Basophils Relative: 1 %
Eosinophils Absolute: 0.3 10*3/uL (ref 0–0.7)
Eosinophils Relative: 4 %
Lymphocytes Relative: 27 %
Lymphs Abs: 2.3 10*3/uL (ref 1.0–3.6)
Monocytes Absolute: 0.9 10*3/uL (ref 0.2–1.0)
Monocytes Relative: 10 %
Neutro Abs: 5 10*3/uL (ref 1.4–6.5)
Neutrophils Relative %: 58 %

## 2016-07-20 LAB — COMPREHENSIVE METABOLIC PANEL
ALT: 51 U/L (ref 17–63)
AST: 33 U/L (ref 15–41)
Albumin: 4 g/dL (ref 3.5–5.0)
Alkaline Phosphatase: 61 U/L (ref 38–126)
Anion gap: 7 (ref 5–15)
BUN: 16 mg/dL (ref 6–20)
CO2: 25 mmol/L (ref 22–32)
Calcium: 9 mg/dL (ref 8.9–10.3)
Chloride: 105 mmol/L (ref 101–111)
Creatinine, Ser: 1.38 mg/dL — ABNORMAL HIGH (ref 0.61–1.24)
GFR calc Af Amer: >60 mL/min (ref >60)
GFR calc non Af Amer: 58 mL/min — ABNORMAL LOW (ref >60)
Glucose, Bld: 95 mg/dL (ref 65–99)
Potassium: 4.1 mmol/L (ref 3.5–5.1)
Sodium: 137 mmol/L (ref 135–145)
Total Bilirubin: 0.5 mg/dL (ref 0.3–1.2)
Total Protein: 7.4 g/dL (ref 6.5–8.1)

## 2016-07-20 LAB — URINALYSIS, COMPLETE (UACMP) WITH MICROSCOPIC
Bacteria, UA: NONE SEEN
Bilirubin Urine: NEGATIVE
Glucose, UA: NEGATIVE mg/dL
Hgb urine dipstick: NEGATIVE
Ketones, ur: NEGATIVE mg/dL
Leukocytes, UA: NEGATIVE
Nitrite: NEGATIVE
Protein, ur: NEGATIVE mg/dL
Specific Gravity, Urine: 1.016 (ref 1.005–1.030)
Squamous Epithelial / LPF: NONE SEEN
WBC, UA: NONE SEEN WBC/hpf (ref 0–5)
pH: 6 (ref 5.0–8.0)

## 2016-07-20 LAB — CBC
HCT: 44 % (ref 40.0–52.0)
Hemoglobin: 15 g/dL (ref 13.0–18.0)
MCH: 31.3 pg (ref 26.0–34.0)
MCHC: 34.1 g/dL (ref 32.0–36.0)
MCV: 91.9 fL (ref 80.0–100.0)
Platelets: 207 10*3/uL (ref 150–440)
RBC: 4.79 MIL/uL (ref 4.40–5.90)
RDW: 13.7 % (ref 11.5–14.5)
WBC: 8.2 10*3/uL (ref 3.8–10.6)

## 2016-07-20 LAB — BRAIN NATRIURETIC PEPTIDE: B Natriuretic Peptide: 18 pg/mL (ref 0.0–100.0)

## 2016-07-20 LAB — APTT: aPTT: 45 seconds — ABNORMAL HIGH (ref 24–36)

## 2016-07-20 LAB — PROTIME-INR
INR: 1.03
Prothrombin Time: 13.5 seconds (ref 11.4–15.2)

## 2016-07-20 LAB — LACTIC ACID, PLASMA: Lactic Acid, Venous: 0.9 mmol/L (ref 0.5–1.9)

## 2016-07-20 MED ORDER — IOPAMIDOL (ISOVUE-300) INJECTION 61%: 100.0000 mL | Freq: Once | INTRAVENOUS | Status: DC | PRN

## 2016-07-20 MED ORDER — HYDROCODONE-ACETAMINOPHEN 5-325 MG PO TABS: 2.0000 | ORAL_TABLET | Freq: Four times a day (QID) | ORAL | 0 refills | Status: DC | PRN

## 2016-07-20 MED ORDER — KETOROLAC TROMETHAMINE 30 MG/ML IJ SOLN
30.0000 mg | Freq: Once | INTRAMUSCULAR | Status: AC
  Administered 2016-07-20: 30 mg via INTRAVENOUS
  Filled 2016-07-20: qty 1

## 2016-07-20 MED ORDER — IOPAMIDOL (ISOVUE-370) INJECTION 76%
100.0000 mL | Freq: Once | INTRAVENOUS | Status: AC | PRN
  Administered 2016-07-20: 100 mL via INTRAVENOUS

## 2016-07-20 MED ORDER — IOPAMIDOL (ISOVUE-300) INJECTION 61%
30.0000 mL | Freq: Once | INTRAVENOUS | Status: AC | PRN
  Administered 2016-07-20: 30 mL via ORAL

## 2016-07-20 NOTE — Discharge Instructions (Signed)
Use the hydrocodone one pill 4 times a day as needed for pain. Rest take it easy. Return for increasing pain fever or shortness of breath or any other problems. Please follow-up with your regular doctor as well. Call to get an appointment in the next few days.

## 2016-07-20 NOTE — ED Provider Notes (Signed)
[facility] Emergency Department Provider Note   ____________________________________________   First MD Initiated Contact with Patient 07/20/16 1425     (approximate)  I have reviewed the triage vital signs and the nursing notes.   HISTORY  Chief Complaint Shortness of Breath and Flank Pain    HPI Justin [name] is a 51 y.o. male patient reports she's had blood clots in several arteries one in the leg to in the neck and most recently in left subclavian artery. That was removed recently. Patient developed some pain in the right lower chest just at the bottom of the ribs 4 days ago this is gotten worse every day since then he is now having a lot of pain with movement or deep breathing. He is short of breath. He is not running a fever not coughing. Has no other chest pain. Pain is severe.   Past Medical History:  Diagnosis Date  . Arm vein blood clot    right  . DVT (deep venous thrombosis) ([place]) 2012; January 2016   History of right arm; now right lower shotty  . GERD (gastroesophageal reflux disease)   . Vascular thoracic outlet syndrome    Right     Patient Active Problem List   Diagnosis Date Noted  . Lupus anticoagulant positive 07/08/2016  . DVT (deep venous thrombosis) ([place]) 07/08/2016  . Thoracic outlet syndrome 07/08/2016  . Pain of left scapula 07/05/2016  . Dyspnea 07/05/2016  . Left subclavian vein thrombosis ([place]) 06/29/2016  . Abnormal finding on EKG 10/04/2014  . Atypical chest pain 10/04/2014    Past Surgical History:  Procedure Laterality Date  . CAROTID ANGIOGRAM    . KNEE CARTILAGE SURGERY     Performed for cracked patella and torn meniscus  . Right upper extremity thrombectomy  2012  . SCALENOTOMY W/O RESECTION CERVICAL RIB  2012   4 right-sided thoracic outlet syndrome    Prior to Admission medications   Medication Sig Start Date End Date Taking? Authorizing Provider  azithromycin (ZITHROMAX) 250 MG tablet Take  2 tablets (500mg ) by mouth on Day 1. Take 1 tablet (250mg ) by mouth on Days 2-5. 06/18/16   Historical Provider, MD  enoxaparin (LOVENOX) 150 MG/ML injection Inject 0.74 mLs (110 mg total) into the skin 2 (two) times daily. 07/08/16 07/08/17  Lequita Asal, MD  HYDROcodone-acetaminophen (NORCO) 5-325 MG tablet Take 2 tablets by mouth every 6 (six) hours as needed for moderate pain. 07/20/16   Nena Polio, MD  HYDROcodone-homatropine [facility]) 5-1.5 MG/5ML syrup Take by mouth. 06/18/16   Historical Provider, MD  hydrOXYzine (ATARAX/VISTARIL) 25 MG tablet Take 1 tablet (25 mg total) by mouth 3 (three) times daily as needed. 03/25/16   Volney American, PA-C  SUMAtriptan (IMITREX) 100 MG tablet Take 100 mg by mouth.    Historical Provider, MD  topiramate (TOPAMAX) 50 MG tablet Take 50 mg by mouth daily. 01/28/16 07/08/16  Historical Provider, MD  XARELTO 20 MG TABS tablet Take 20 mg by mouth daily with supper.  09/24/14   Historical Provider, MD    Allergies Patient has no known allergies.  Family History  Problem Relation Age of Onset  . Heart disease Father   . Breast cancer Father   . Diabetes Father   . Heart attack Father   . Cancer Maternal Grandmother   . Leukemia Maternal Grandfather     Social History Social History  Substance Use Topics  . Smoking status: Never Smoker  .  Smokeless tobacco: Never Used  . Alcohol use No    Review of Systems Constitutional: No fever/chills Eyes: No visual changes. ENT: No sore throat. Cardiovascular: See history of present illness Respiratory: The history of present illness Gastrointestinal: No abdominal pain.  No nausea, no vomiting.  No diarrhea.  No constipation. Genitourinary: Negative for dysuria. Musculoskeletal: Negative for back pain. Skin: Negative for rash. Neurological: Negative for headaches, focal weakness or numbness.  10-point ROS otherwise negative.  ____________________________________________   PHYSICAL  EXAM:  VITAL SIGNS: ED Triage Vitals [07/20/16 1300]  Enc Vitals Group     BP 106/68     Pulse Rate 60     Resp 18     Temp 98.5 F (36.9 C)     Temp Source Oral     SpO2 100 %     Weight 250 lb (113.4 kg)     Height 5\' 11"  (1.803 m)     Head Circumference      Peak Flow      Pain Score 8     Pain Loc      Pain Edu?      Excl. in [place]?     Constitutional: Alert and oriented. Well appearing and in no acute distress. Eyes: Conjunctivae are normal. PERRL. EOMI. Head: Atraumatic. Nose: No congestion/rhinnorhea. Mouth/Throat: Mucous membranes are moist.  Oropharynx non-erythematous. Neck: No stridor. Cardiovascular: Normal rate, regular rhythm. Grossly normal heart sounds.  Good peripheral circulation. Respiratory: Normal respiratory effort.  No retractions. Lungs CTAB. Gastrointestinal: Soft and nontender. No distention. No abdominal bruits. Right CVA tenderness. }Musculoskeletal: No lower extremity tenderness nor edema.  No joint effusions. Neurologic:  Normal speech and language. No gross focal neurologic deficits are appreciated. No gait instability. Skin:  Skin is warm, dry and intact. No rash noted.  ____________________________________________   LABS (all labs ordered are listed, but only abnormal results are displayed)  Labs Reviewed  COMPREHENSIVE METABOLIC PANEL - Abnormal; Notable for the following:       Result Value   Creatinine, Ser 1.38 (*)    GFR calc non Af Amer 58 (*)    All other components within normal limits  APTT - Abnormal; Notable for the following:    aPTT 45 (*)    All other components within normal limits  URINALYSIS, COMPLETE (UACMP) WITH MICROSCOPIC - Abnormal; Notable for the following:    Color, Urine YELLOW (*)    APPearance CLEAR (*)    All other components within normal limits  CBC  PROTIME-INR  BRAIN NATRIURETIC PEPTIDE  LACTIC ACID, PLASMA  DIFFERENTIAL  TROPONIN I  LACTIC ACID, PLASMA    ____________________________________________  EKG  EKG read and interpreted by me shows sinus bradycardia rate of 58 normal axis no acute ST-T wave changes ____________________________________________  RADIOLOGY  Study Result   CLINICAL DATA:  Shortness of breath and chest pain for 2 days  EXAM: CHEST  2 VIEW  COMPARISON:  Chest radiograph November 21, 2010 and chest CT July 02, 2016  FINDINGS: There is scarring in the left lower lobe region. There is postoperative change in the right upper hemithorax with removal of a portion of the right anterior first rib. There is no edema or consolidation. Heart size and pulmonary vascularity are normal. No adenopathy.  IMPRESSION: Postoperative change on the right with removal of a portion of the right first rib. Chronic scarring left lower lobe. No edema or consolidation. Stable cardiac silhouette.   Electronically Signed   By: Lowella Grip  III M.D.   On: 07/20/2016 13:39    Study Result   CLINICAL DATA:  Chest pain,  EXAM: CT ANGIOGRAPHY CHEST  CT ABDOMEN AND PELVIS WITH CONTRAST  TECHNIQUE: Multidetector CT imaging of the chest was performed using the standard protocol during bolus administration of intravenous contrast. Multiplanar CT image reconstructions and MIPs were obtained to evaluate the vascular anatomy. Multidetector CT imaging of the abdomen and pelvis was performed using the standard protocol during bolus administration of intravenous contrast.  CONTRAST:  100 mL of Isovue 370 intravenously.  COMPARISON:  None.  FINDINGS: CTA CHEST FINDINGS  Cardiovascular: Satisfactory opacification of the pulmonary arteries to the segmental level. No evidence of pulmonary embolism. Normal heart size. No pericardial effusion.  Mediastinum/Nodes: No enlarged mediastinal, hilar, or axillary lymph nodes. Thyroid gland, trachea, and esophagus demonstrate no significant  findings.  Lungs/Pleura: Lungs are clear. No pleural effusion or pneumothorax.  Musculoskeletal: No chest wall abnormality. No acute or significant osseous findings.  Review of the MIP images confirms the above findings.  CT ABDOMEN and PELVIS FINDINGS  Hepatobiliary: No focal liver abnormality is seen. No gallstones, gallbladder wall thickening, or biliary dilatation.  Pancreas: Unremarkable. No pancreatic ductal dilatation or surrounding inflammatory changes.  Spleen: Normal in size without focal abnormality.  Adrenals/Urinary Tract: Adrenal glands are unremarkable. Kidneys are normal, without renal calculi, focal lesion, or hydronephrosis. Bladder is unremarkable.  Stomach/Bowel: Stomach is within normal limits. Appendix appears normal. No evidence of bowel wall thickening, distention, or inflammatory changes.  Vascular/Lymphatic: No significant vascular findings are present. No enlarged abdominal or pelvic lymph nodes.  Reproductive: Prostate is unremarkable.  Other: No abnormal fluid collection is noted. Multiple foci of abnormal density is noted in the subcutaneous tissues of the anterior abdominal wall, particularly in the left lower quadrant, which may represent contusions or injection sites.  Musculoskeletal: No acute or significant osseous findings.  Review of the MIP images confirms the above findings.  IMPRESSION: Findings consistent with injection sites or contusions involving the subcutaneous tissues of the left lower quadrant of the abdomen. No other abnormality seen in the abdomen or pelvis.  No evidence of pulmonary embolus. No definite abnormality seen in the chest.   Electronically Signed   By: Marijo Conception, M.D.   On: 07/20/2016 15:49    ____________________________________________   PROCEDURES  Procedure(s) performed:   Procedures  Critical Care performed:    ____________________________________________   INITIAL IMPRESSION / ASSESSMENT AND PLAN / ED COURSE  Pertinent labs & imaging results that were available during my care of the patient were reviewed by me and considered in my medical decision making (see chart for details).        ________________     ____________________________   FINAL CLINICAL IMPRESSION(S) / ED DIAGNOSES  Final diagnoses:  Right flank pain      NEW MEDICATIONS STARTED DURING THIS VISIT:  New Prescriptions   HYDROCODONE-ACETAMINOPHEN (NORCO) 5-325 MG TABLET    Take 2 tablets by mouth every 6 (six) hours as needed for moderate pain.     Note:  This document was prepared using Dragon voice recognition software and may include unintentional dictation errors.    Nena Polio, MD 07/20/16 [phone]

## 2016-07-20 NOTE — ED Notes (Signed)
Patient here for SOB. Hx of blood clots. Recently dx with lupus. Patient takes two lovenox injections daily for blood clot prevention.

## 2016-07-20 NOTE — ED Triage Notes (Signed)
Pt to ED from home c/o sob and right lower side pain x2 days.  States pain is sharp "and like something is collapsing".  Denies urinary symptoms.  States hx of blood clots x4, most recently 3 weeks ago.  Breath sounds clear, chest rise even bilaterally, pt speaking in complete and coherent sentences.

## 2016-07-20 NOTE — ED Notes (Signed)
Patient transported to X-ray 

## 2016-07-26 ENCOUNTER — Telehealth: Payer: Self-pay | Admitting: *Deleted

## 2016-07-26 NOTE — Telephone Encounter (Signed)
Called patient and LVM to inquire if he is still having bone pain.  Patient instructed to call back and leave a message.

## 2016-07-28 ENCOUNTER — Telehealth: Payer: Self-pay | Admitting: *Deleted

## 2016-07-28 NOTE — Telephone Encounter (Signed)
Left message for patient to call clinic back to discuss bone pain.

## 2016-08-04 ENCOUNTER — Encounter
Admission: RE | Admit: 2016-08-04 | Discharge: 2016-08-04 | Disposition: A | Discharge status: Home / Self Care | Payer: 59 | Source: Ambulatory Visit | Attending: Hematology and Oncology | Admitting: Hematology and Oncology

## 2016-08-04 DIAGNOSIS — M898X1 Other specified disorders of bone, shoulder: Secondary | ICD-10-CM

## 2016-08-04 DIAGNOSIS — R948 Abnormal results of function studies of other organs and systems: Secondary | ICD-10-CM | POA: Diagnosis not present

## 2016-08-04 DIAGNOSIS — R760 Raised antibody titer: Secondary | ICD-10-CM | POA: Insufficient documentation

## 2016-08-04 DIAGNOSIS — I82B12 Acute embolism and thrombosis of left subclavian vein: Secondary | ICD-10-CM | POA: Diagnosis not present

## 2016-08-04 DIAGNOSIS — R918 Other nonspecific abnormal finding of lung field: Secondary | ICD-10-CM | POA: Diagnosis present

## 2016-08-04 DIAGNOSIS — R072 Precordial pain: Secondary | ICD-10-CM | POA: Diagnosis not present

## 2016-08-04 DIAGNOSIS — R0781 Pleurodynia: Secondary | ICD-10-CM

## 2016-08-04 HISTORY — DX: Pleurodynia: R07.81

## 2016-08-04 MED ORDER — TECHNETIUM TC 99M MEDRONATE IV KIT
25.0000 | PACK | Freq: Once | INTRAVENOUS | Status: AC | PRN
  Administered 2016-08-04: 22.789 via INTRAVENOUS

## 2016-08-04 NOTE — Progress Notes (Signed)
[facility]:  08/08/2016  Chief Complaint: ULES [name] is a 51 y.o. male with recurrent thrombosis who is seen for discussion of his bone scan results.   HPI:  Patient returns to clinic today for further evaluation.  He is tolerating Lovenox well.  He currently feels well and is asymptomatic.  He has no neurologic complaints. He denies and recent fevers or illneses. He has a good appetite and denies weight loss.  He has no chest or shortness of breath.  He denies any nausea, vomiting, constipation, or diarrhea. He has no melena or hematochezia. He has no urinary complaints.  Patient feels at his baseline and offers no specific complaints today.      Past Medical History:  Diagnosis Date  . Arm vein blood clot    right  . DVT (deep venous thrombosis) ([place]) 2012; January 2016   History of right arm; now right lower shotty  . GERD (gastroesophageal reflux disease)   . Rib pain on right side 08/04/2016   RT rib pain that radiates around to the back. Level 8 of 10.  Constant. Started 1 mo ago, 1 week after DVT.  Marland Kitchen Vascular thoracic outlet syndrome    Right     Past Surgical History:  Procedure Laterality Date  . CAROTID ANGIOGRAM    . KNEE CARTILAGE SURGERY     Performed for cracked patella and torn meniscus  . Right upper extremity thrombectomy  2012  . SCALENOTOMY W/O RESECTION CERVICAL RIB  2012   4 right-sided thoracic outlet syndrome    Family History  Problem Relation Age of Onset  . Heart disease Father   . Breast cancer Father   . Diabetes Father   . Heart attack Father   . Cancer Maternal Grandmother   . Leukemia Maternal Grandfather     Social History:  reports that he has never smoked. He has never used smokeless tobacco. He reports that he does not drink alcohol or use drugs.  He previously smoked 1/2 pack a day for 10 years.  He stopped smoking in his 22s.  He lives in [place].  He is a Glass blower/designer.  He denies  any exposure to radiation or toxins.  He has a son.  The patient is accompanied by his wife, [name], today.  Allergies: No Known Allergies  Current Medications: Current Outpatient Prescriptions  Medication Sig Dispense Refill  . enoxaparin (LOVENOX) 150 MG/ML injection Inject 0.74 mLs (110 mg total) into the skin 2 (two) times daily. 60 Syringe 0  . hydrOXYzine (ATARAX/VISTARIL) 25 MG tablet Take 1 tablet (25 mg total) by mouth 3 (three) times daily as needed. (Patient not taking: Reported on 08/06/2016) 30 tablet 0  . clobetasol ointment (TEMOVATE) 4.40 % Apply 1 application topically 2 (two) times daily. 30 g 0  . lidocaine (LIDODERM) 5 % Place onto the skin.    Marland Kitchen oxyCODONE-acetaminophen (PERCOCET/ROXICET) 5-325 MG tablet Take 1 tablet by mouth every 6 (six) hours as needed for severe pain. 30 tablet 0  . predniSONE (STERAPRED UNI-PAK 21 TAB) 5 MG (21) TBPK tablet Take by mouth.    . topiramate (TOPAMAX) 50 MG tablet Take 50 mg by mouth daily.     No current facility-administered medications for this visit.     Review of Systems:  GENERAL:  Feels "ok".  No fevers, sweats or weight loss. PERFORMANCE STATUS (ECOG):  1 HEENT:  Visual changes since last year.  No runny  nose, sore throat, mouth sores or tenderness. Lungs: Mild shortness of breath.  No ough.  No hemoptysis. Cardiac:  No chest pain, palpitations, orthopnea, or PND. GI:  No nausea, vomiting, diarrhea, constipation, melena or hematochezia. GU:  No urgency, frequency, dysuria, or hematuria. Musculoskeletal:  Left arm ache.  Left scapula pain.  No muscle tenderness. Extremities:  No pain or swelling. Skin:  Bruise on hip.  Rash on side.  Neuro:  No headache, numbness or weakness, balance or coordination issues. Endocrine:  No diabetes, thyroid issues, hot flashes or night sweats. Psych:  No mood changes, depression or anxiety. Pain:  No focal pain.  Review of systems:  All other systems reviewed and found to be  negative.  Physical Exam: Blood pressure 123/78, pulse (!) 59, temperature (!) 95.3 F (35.2 C), temperature source Tympanic, resp. rate 18, weight 255 lb 11.7 oz (116 kg). GENERAL:  Well developed, well nourished, muscular gentleman sitting comfortably in the exam room in no acute distress. MENTAL STATUS:  Alert and oriented to person, place and time. HEAD: Alopecia.  [name].  Normocephalic, atraumatic, face symmetric, no Cushingoid features. EYES:  Glasses.  Brown eyes.  Pupils equal round and reactive to light and accomodation.  No conjunctivitis or scleral icterus. ENT:  Oropharynx clear without lesion.  Tongue normal. Mucous membranes moist.  RESPIRATORY:  Clear to auscultation without rales, wheezes or rhonchi. CARDIOVASCULAR:  Regular rate and rhythm without murmur, rub or gallop. ABDOMEN:  Soft, non-tender, with active bowel sounds, and no hepatosplenomegaly.  No masses. BACK:  Tender left scapula. SKIN:  No rashes, ulcers or lesions. EXTREMITIES: Mild right upper extremity edema.  No skin discoloration or tenderness.  No palpable cords. LYMPH NODES: No palpable cervical, supraclavicular, axillary or inguinal adenopathy  NEUROLOGICAL: Unremarkable. PSYCH:  Appropriate.   Appointment on 08/05/2016  Component Date Value Ref Range Status  . Sodium 08/05/2016 134* 135 - 145 mmol/L Final  . Potassium 08/05/2016 3.6  3.5 - 5.1 mmol/L Final  . Chloride 08/05/2016 105  101 - 111 mmol/L Final  . CO2 08/05/2016 25  22 - 32 mmol/L Final  . Glucose, Bld 08/05/2016 101* 65 - 99 mg/dL Final  . BUN 08/05/2016 15  6 - 20 mg/dL Final  . Creatinine, Ser 08/05/2016 1.59* 0.61 - 1.24 mg/dL Final  . Calcium 08/05/2016 8.7* 8.9 - 10.3 mg/dL Final  . Total Protein 08/05/2016 7.0  6.5 - 8.1 g/dL Final  . Albumin 08/05/2016 3.8  3.5 - 5.0 g/dL Final  . AST 08/05/2016 42* 15 - 41 U/L Final  . ALT 08/05/2016 36  17 - 63 U/L Final  . Alkaline Phosphatase 08/05/2016 72  38 - 126 U/L Final  .  Total Bilirubin 08/05/2016 <0.1* 0.3 - 1.2 mg/dL Final  . GFR calc non Af Amer 08/05/2016 49* >60 mL/min Final  . GFR calc Af Amer 08/05/2016 56* >60 mL/min Final   Comment: (NOTE) The eGFR has been calculated using the CKD EPI equation. This calculation has not been validated in all clinical situations. eGFR's persistently <60 mL/min signify possible Chronic Kidney Disease.   . Anion gap 08/05/2016 4* 5 - 15 Final  . WBC 08/05/2016 8.1  3.8 - 10.6 K/uL Final  . RBC 08/05/2016 4.59  4.40 - 5.90 MIL/uL Final  . Hemoglobin 08/05/2016 14.4  13.0 - 18.0 g/dL Final  . HCT 08/05/2016 42.4  40.0 - 52.0 % Final  . MCV 08/05/2016 92.4  80.0 - 100.0 fL Final  . [facility] 08/05/2016  31.4  26.0 - 34.0 pg Final  . MCHC 08/05/2016 34.0  32.0 - 36.0 g/dL Final  . RDW 08/05/2016 13.4  11.5 - 14.5 % Final  . Platelets 08/05/2016 239  150 - 440 K/uL Final  . Neutrophils Relative % 08/05/2016 54  % Final  . Neutro Abs 08/05/2016 4.4  1.4 - 6.5 K/uL Final  . Lymphocytes Relative 08/05/2016 31  % Final  . Lymphs Abs 08/05/2016 2.5  1.0 - 3.6 K/uL Final  . Monocytes Relative 08/05/2016 11  % Final  . Monocytes Absolute 08/05/2016 0.9  0.2 - 1.0 K/uL Final  . Eosinophils Relative 08/05/2016 3  % Final  . Eosinophils Absolute 08/05/2016 0.2  0 - 0.7 K/uL Final  . Basophils Relative 08/05/2016 1  % Final  . Basophils Absolute 08/05/2016 0.1  0 - 0.1 K/uL Final    Assessment:  TAJUAN DUFAULT is a 51 y.o. male with recurrent thrombosis x 4.  He developed a right upper extremity clot in 2010. He underwent thrombolysis.  He was on Coumadin x 6 months.  He developed a recurrent clot in the right upper extremity in 2011.  He underwent thoracic outlet decompression.  He was on Coumadin for 6-9 months.  He developed a right lower extremity DVT in 2016.  He was started on Xarelto.  He developed a left upper extremity DVT on 06/07/2016 while on Xarelto.  Duplex revealed a near occlusive DVT in the lateral aspect of the  left subclavian vein and nonocclusive thrombus extending into the adjacent axillary vein.  He was started on Lovenox.  Hypercoagulable work-up on 06/29/2016 revealed the following normal studies:  CBC with diff, Factor V Leiden, prothrombin gene mutation, protein C antigen and activity, protein S antigen and activity, ATIII antigen and activity, anticardiolipin antibodies and beta-2 glycoprotein antibodies.  Lupus anticoagulant panel was positive on Xarelto and Lovenox.  Chest CT angiogram on 07/02/2016 revealed no evidence of pulmonary embolism.  There were small pulmonary nodules, largest 3 mm, of unclear significance.   Plan: 1.  Review hypercoagulable work-up.  Unclear significance of + lupus anticoagulant on Xarelto and Lovenox.  Patient requires lifelong anticoagulation secondary to recurrent clot. 2.  Review chest CT angiogram on 07/02/2016.  No evidence of pulmonary embolism.  Tiny (3 mm) lung nodules of unclear significance.  Will need follow-up chest CT in 6 months. 3.  Bone scan results review independently with no obvious abnormality to explain his ongoing pain localized to bone (shoulder area, chest/ribs). 4.  Continue Lovenox. 6.  RTC in 1 month for MD assessment.   Lloyd Huger, MD  08/08/2016, 5:02 PM

## 2016-08-05 ENCOUNTER — Telehealth: Payer: Self-pay | Admitting: Family Medicine

## 2016-08-05 ENCOUNTER — Inpatient Hospital Stay (HOSPITAL_BASED_OUTPATIENT_CLINIC_OR_DEPARTMENT_OTHER): Payer: 59 | Admitting: Oncology

## 2016-08-05 ENCOUNTER — Inpatient Hospital Stay: Payer: 59 | Attending: Hematology and Oncology

## 2016-08-05 VITALS — BP 123/78 | HR 59 | Temp 95.3°F | Resp 18 | Wt 255.7 lb

## 2016-08-05 DIAGNOSIS — K219 Gastro-esophageal reflux disease without esophagitis: Secondary | ICD-10-CM | POA: Insufficient documentation

## 2016-08-05 DIAGNOSIS — Z86718 Personal history of other venous thrombosis and embolism: Secondary | ICD-10-CM | POA: Diagnosis not present

## 2016-08-05 DIAGNOSIS — I82B12 Acute embolism and thrombosis of left subclavian vein: Secondary | ICD-10-CM

## 2016-08-05 DIAGNOSIS — R918 Other nonspecific abnormal finding of lung field: Secondary | ICD-10-CM | POA: Insufficient documentation

## 2016-08-05 DIAGNOSIS — J9811 Atelectasis: Secondary | ICD-10-CM | POA: Diagnosis not present

## 2016-08-05 DIAGNOSIS — R0781 Pleurodynia: Secondary | ICD-10-CM

## 2016-08-05 DIAGNOSIS — R760 Raised antibody titer: Secondary | ICD-10-CM

## 2016-08-05 DIAGNOSIS — R0602 Shortness of breath: Secondary | ICD-10-CM | POA: Diagnosis not present

## 2016-08-05 DIAGNOSIS — Z7901 Long term (current) use of anticoagulants: Secondary | ICD-10-CM | POA: Diagnosis not present

## 2016-08-05 DIAGNOSIS — Z79899 Other long term (current) drug therapy: Secondary | ICD-10-CM | POA: Diagnosis not present

## 2016-08-05 DIAGNOSIS — K8070 Calculus of gallbladder and bile duct without cholecystitis without obstruction: Secondary | ICD-10-CM | POA: Diagnosis not present

## 2016-08-05 DIAGNOSIS — M898X1 Other specified disorders of bone, shoulder: Secondary | ICD-10-CM

## 2016-08-05 DIAGNOSIS — R1011 Right upper quadrant pain: Secondary | ICD-10-CM | POA: Diagnosis not present

## 2016-08-05 DIAGNOSIS — R109 Unspecified abdominal pain: Secondary | ICD-10-CM | POA: Diagnosis not present

## 2016-08-05 DIAGNOSIS — Z87891 Personal history of nicotine dependence: Secondary | ICD-10-CM | POA: Diagnosis not present

## 2016-08-05 DIAGNOSIS — N289 Disorder of kidney and ureter, unspecified: Secondary | ICD-10-CM

## 2016-08-05 DIAGNOSIS — N281 Cyst of kidney, acquired: Secondary | ICD-10-CM | POA: Diagnosis not present

## 2016-08-05 DIAGNOSIS — I82511 Chronic embolism and thrombosis of right femoral vein: Secondary | ICD-10-CM

## 2016-08-05 LAB — CBC WITH DIFFERENTIAL/PLATELET
Basophils Absolute: 0.1 10*3/uL (ref 0–0.1)
Basophils Relative: 1 %
Eosinophils Absolute: 0.2 10*3/uL (ref 0–0.7)
Eosinophils Relative: 3 %
HCT: 42.4 % (ref 40.0–52.0)
Hemoglobin: 14.4 g/dL (ref 13.0–18.0)
Lymphocytes Relative: 31 %
Lymphs Abs: 2.5 10*3/uL (ref 1.0–3.6)
MCH: 31.4 pg (ref 26.0–34.0)
MCHC: 34 g/dL (ref 32.0–36.0)
MCV: 92.4 fL (ref 80.0–100.0)
Monocytes Absolute: 0.9 10*3/uL (ref 0.2–1.0)
Monocytes Relative: 11 %
Neutro Abs: 4.4 10*3/uL (ref 1.4–6.5)
Neutrophils Relative %: 54 %
Platelets: 239 10*3/uL (ref 150–440)
RBC: 4.59 MIL/uL (ref 4.40–5.90)
RDW: 13.4 % (ref 11.5–14.5)
WBC: 8.1 10*3/uL (ref 3.8–10.6)

## 2016-08-05 LAB — COMPREHENSIVE METABOLIC PANEL
ALT: 36 U/L (ref 17–63)
AST: 42 U/L — ABNORMAL HIGH (ref 15–41)
Albumin: 3.8 g/dL (ref 3.5–5.0)
Alkaline Phosphatase: 72 U/L (ref 38–126)
Anion gap: 4 — ABNORMAL LOW (ref 5–15)
BUN: 15 mg/dL (ref 6–20)
CO2: 25 mmol/L (ref 22–32)
Calcium: 8.7 mg/dL — ABNORMAL LOW (ref 8.9–10.3)
Chloride: 105 mmol/L (ref 101–111)
Creatinine, Ser: 1.59 mg/dL — ABNORMAL HIGH (ref 0.61–1.24)
GFR calc Af Amer: 56 mL/min — ABNORMAL LOW (ref >60)
GFR calc non Af Amer: 49 mL/min — ABNORMAL LOW (ref >60)
Glucose, Bld: 101 mg/dL — ABNORMAL HIGH (ref 65–99)
Potassium: 3.6 mmol/L (ref 3.5–5.1)
Sodium: 134 mmol/L — ABNORMAL LOW (ref 135–145)
Total Bilirubin: <0.1 mg/dL — ABNORMAL LOW (ref 0.3–1.2)
Total Protein: 7 g/dL (ref 6.5–8.1)

## 2016-08-05 NOTE — Progress Notes (Signed)
Patient continues to have pain in right side under lung area.  Worse when he tries to take a deep breath or cough. States he has to keep pain medication in his system to cover the pain.

## 2016-08-05 NOTE — Telephone Encounter (Signed)
Can we please get him into nephrology for evaluation? Thanks!

## 2016-08-05 NOTE — Telephone Encounter (Signed)
-----   Message from Donnita Falls, RN sent at 08/05/2016  1:33 PM EST ----- Dr. Mike Gip wanted you to be aware of his increasing creatinine  Thanks,  Mallie Snooks RN

## 2016-08-06 ENCOUNTER — Encounter: Payer: Self-pay | Admitting: Family Medicine

## 2016-08-06 ENCOUNTER — Ambulatory Visit (INDEPENDENT_AMBULATORY_CARE_PROVIDER_SITE_OTHER): Payer: 59 | Admitting: Family Medicine

## 2016-08-06 VITALS — BP 119/75 | HR 88 | Temp 98.4°F | Wt 256.0 lb

## 2016-08-06 DIAGNOSIS — R1011 Right upper quadrant pain: Secondary | ICD-10-CM | POA: Diagnosis not present

## 2016-08-06 LAB — UA/M W/RFLX CULTURE, ROUTINE
Bilirubin, UA: NEGATIVE
Glucose, UA: NEGATIVE
Ketones, UA: NEGATIVE
Leukocytes, UA: NEGATIVE
Nitrite, UA: NEGATIVE
Protein, UA: NEGATIVE
Specific Gravity, UA: 1.02 (ref 1.005–1.030)
Urobilinogen, Ur: 0.2 mg/dL (ref 0.2–1.0)
pH, UA: 6 (ref 5.0–7.5)

## 2016-08-06 MED ORDER — OXYCODONE-ACETAMINOPHEN 5-325 MG PO TABS: 1.0000 | ORAL_TABLET | Freq: Four times a day (QID) | ORAL | 0 refills | Status: AC | PRN

## 2016-08-06 MED ORDER — CLOBETASOL PROPIONATE 0.05 % EX OINT: 1.0000 "application " | TOPICAL_OINTMENT | Freq: Two times a day (BID) | CUTANEOUS | 0 refills | Status: DC

## 2016-08-06 NOTE — Progress Notes (Signed)
BP 119/75   Pulse 88   Temp 98.4 F (36.9 C)   Wt 256 lb (116.1 kg)   SpO2 97%   BMI 35.70 kg/m    Subjective:    Patient ID: Justin [name], male    DOB: 11-15-1965, 51 y.o.   MRN: [id number]  HPI: Justin [name] is a 51 y.o. male  Chief Complaint  Patient presents with  . ER Follow up    Was told he had gallstones, needs eval/referral.  . Rib Injury    Went to the ED for right rib pain. Given pain meds and steroids. Not much improvement.   RUQ abdominal pain Patient with a history of multiple blood clots on Lovenox presents to clinic today for ED visit follow up after going to the ED on 08/05/16 for right-sided rib and flank pain and an incidental [place] finding of a gall stone. He was discharged on Percocet and Prednisone with mild pain relief. Today, he complains of constant sharp RUQ pain that radiates to right flank x 1 month. Not aggravated by food. Worse with certain positions, direct pressure, and deep inspiration. Associated symptoms include urinary frequency, dysuria. Denies fever/chills, weight loss, early satiety, decrease in appetite, chest pain, shortness of breath, nausea, vomiting, hematuria, rectal bleeding, constipation, diarrhea. Negative tobacco or alcohol history.   Recent work up per radiology notes include a bone scan on 2/28 showing a "focus of moderately increased uptake at approximately the T9 right paravertebral region". CT-A Chest on 2/13 showed a "large right sided osteophyte was noted at the T9-T10 level" and CT abdomen on 2/13 showed a "multiple foci of abnormal density is noted in the subcutaneous tissues of the anterior abdominal wall, particularly in the left lower quadrant, which may represent contusions or injection sites and a renal cyst." CMP on 3/1 shows elevated creatinine at 1.59, GFR of 56 and CBC on 3/1 was unremarkable. Lupus anticoagulant panel on 1/23 was elevated at 137.6 and suspected secondary to anticoagulation therapy.   Relevant past  medical, surgical, family and social history reviewed and updated as indicated. Interim medical history since our last visit reviewed. Allergies and medications reviewed and updated.  Review of Systems  Constitutional: Negative for appetite change, chills, diaphoresis, fatigue, fever and unexpected weight change.  HENT: Negative.   Eyes: Negative.   Respiratory: Negative for cough, choking, chest tightness, shortness of breath, wheezing and stridor.   Cardiovascular: Negative for chest pain, palpitations and leg swelling.  Gastrointestinal: Positive for abdominal pain. Negative for abdominal distention, anal bleeding, blood in stool, constipation, diarrhea, nausea, rectal pain and vomiting.  Genitourinary: Positive for dysuria and frequency.  Musculoskeletal: Negative for back pain, myalgias and neck pain.  Skin: Negative for color change, pallor and rash.  Neurological: Negative for dizziness, syncope, weakness, numbness and headaches.   Per HPI unless specifically indicated above     Objective:    BP 119/75   Pulse 88   Temp 98.4 F (36.9 C)   Wt 256 lb (116.1 kg)   SpO2 97%   BMI 35.70 kg/m   Wt Readings from Last 3 Encounters:  08/06/16 256 lb (116.1 kg)  08/05/16 255 lb 11.7 oz (116 kg)  07/20/16 250 lb (113.4 kg)    Physical Exam  Constitutional: He is oriented to person, place, and time. He appears well-developed and well-nourished.  HENT:  Head: Normocephalic and atraumatic.  Eyes: Conjunctivae and EOM are normal.  Neck: Normal range of motion. Neck supple.  Cardiovascular: Normal  rate, regular rhythm, normal heart sounds and intact distal pulses.   No chest wall tenderness.  Pulmonary/Chest: Effort normal and breath sounds normal.  Abdominal: Soft. Bowel sounds are normal.  RUQ exquisitely tender to light palpation. Positive guarding. No distension, pulsatile masses.   Musculoskeletal: Normal range of motion. He exhibits no edema, tenderness or deformity.    Neurological: He is alert and oriented to person, place, and time.  Skin: Skin is warm and dry. No rash noted. He is not diaphoretic. No erythema. No pallor.  Psychiatric: He has a normal mood and affect. His behavior is normal. Judgment and thought content normal.    Results for orders placed or performed in visit on 08/06/16  UA/M w/rflx Culture, Routine  Result Value Ref Range   Specific Gravity, UA 1.020 1.005 - 1.030   pH, UA 6.0 5.0 - 7.5   Color, UA Yellow Yellow   Appearance Ur Clear Clear   Leukocytes, UA Negative Negative   Protein, UA Negative Negative/Trace   Glucose, UA Negative Negative   Ketones, UA Negative Negative   RBC, UA Trace (A) Negative   Bilirubin, UA Negative Negative   Urobilinogen, Ur 0.2 0.2 - 1.0 mg/dL   Nitrite, UA Negative Negative      Assessment & Plan:   Problem List Items Addressed This Visit    None    Visit Diagnoses    Right upper quadrant abdominal pain    -  Primary   Relevant Orders   UA/M w/rflx Culture, Routine (Completed)   Ambulatory referral to Rheumatology      Patient with afebrile, constant RUQ abdominal pain x 1 month with nonspecific lab findings that demonstrate decreased kidney functioning and no signs of infectious source on CBC. Given constellation of decreasing renal function, lung nodules, and possible subcutaneous nodules, will refer to rheumatology for further eval and r/o of sarcoid, etc. In meantime, will try clobetasol ointment, continue lidocaine patches and percocet prn for pain relief. Remain out of work until able to be off percocet.  Follow up plan: Return if symptoms worsen or fail to improve.

## 2016-08-09 ENCOUNTER — Encounter: Payer: Self-pay | Admitting: Family Medicine

## 2016-08-09 ENCOUNTER — Telehealth: Payer: Self-pay | Admitting: Family Medicine

## 2016-08-09 NOTE — Patient Instructions (Signed)
Follow up as needed

## 2016-08-09 NOTE — Telephone Encounter (Signed)
Patients wife dropped off FMLA forms to be filled out ASAP.  I have placed them in Rachels basket.  Thanks

## 2016-08-10 ENCOUNTER — Telehealth: Payer: Self-pay

## 2016-08-10 ENCOUNTER — Other Ambulatory Visit: Payer: Self-pay | Admitting: *Deleted

## 2016-08-10 DIAGNOSIS — R918 Other nonspecific abnormal finding of lung field: Secondary | ICD-10-CM

## 2016-08-10 DIAGNOSIS — M898X1 Other specified disorders of bone, shoulder: Secondary | ICD-10-CM

## 2016-08-10 DIAGNOSIS — R760 Raised antibody titer: Secondary | ICD-10-CM

## 2016-08-10 DIAGNOSIS — I82B12 Acute embolism and thrombosis of left subclavian vein: Secondary | ICD-10-CM

## 2016-08-10 MED ORDER — ENOXAPARIN SODIUM 150 MG/ML ~~LOC~~ SOLN: 1.0000 mg/kg | Freq: Two times a day (BID) | SUBCUTANEOUS | 0 refills | Status: DC

## 2016-08-10 NOTE — Telephone Encounter (Addendum)
Called inquiring about his blood thinner supposing to be changed to something else. He only has 3 Lovenox injections left. Please advise

## 2016-08-10 NOTE — Telephone Encounter (Signed)
  Please refill Lovenox.  We will need to see if Arixtra will be approved long term.  M

## 2016-08-10 NOTE — Telephone Encounter (Signed)
Chrys Racer from [facility] called. Apolonio Schneiders put in an urgent referral for patient to see Rheumatology to rule out Sarcoidosis. Appointment scheduled for tomorrow. However, after Dr. Meda Coffee reviewed notes she didn't believe at this time patient had Sarcoidosis. Dr. Meda Coffee advised patient be evaluated by Pulmonology first and then if they felt patient needed to see Rheumatology the referral would come from them.

## 2016-08-10 NOTE — Telephone Encounter (Signed)
Forms are complete and ready to be picked up. Attempted to contact patient, but we were having issues with the phone.

## 2016-08-10 NOTE — Telephone Encounter (Signed)
Noted. Referral to pulmonology generated.

## 2016-08-10 NOTE — Telephone Encounter (Signed)
Refill sent for Lovenox. Per pharmacy Arixtra has a $1600 copay. Wife stated she thought he was to be changed to a pill.

## 2016-08-10 NOTE — Telephone Encounter (Signed)
Left message for patient that FMLA paperwork was ready to be picked up.

## 2016-08-11 ENCOUNTER — Telehealth: Payer: Self-pay | Admitting: Family Medicine

## 2016-08-11 ENCOUNTER — Telehealth: Payer: Self-pay | Admitting: *Deleted

## 2016-08-11 NOTE — Telephone Encounter (Signed)
Pt called and would like to know when he could go back to work. He stated he is still taking pain meds.

## 2016-08-11 NOTE — Telephone Encounter (Signed)
Spoke with Apolonio Schneiders and advised patient that as long as he is on pain meds, he is unable to return to work.

## 2016-08-11 NOTE — Telephone Encounter (Signed)
There is no oral equivalent, I will call wife  Justin [name]

## 2016-08-11 NOTE — Telephone Encounter (Signed)
Returned call to wife and discussed that there was no po equivalent to lovenox since her husband had a clot on xarelto at this time, voiced understanding that he would have to stay on lovenox at this time.

## 2016-08-12 ENCOUNTER — Ambulatory Visit: Payer: Self-pay | Admitting: Family Medicine

## 2016-08-13 ENCOUNTER — Encounter: Payer: Self-pay | Admitting: Family Medicine

## 2016-08-13 ENCOUNTER — Ambulatory Visit (INDEPENDENT_AMBULATORY_CARE_PROVIDER_SITE_OTHER): Payer: 59 | Admitting: Family Medicine

## 2016-08-13 VITALS — BP 118/76 | HR 76 | Temp 98.0°F | Wt 252.0 lb

## 2016-08-13 DIAGNOSIS — R1011 Right upper quadrant pain: Secondary | ICD-10-CM | POA: Diagnosis not present

## 2016-08-13 MED ORDER — OXYCODONE-ACETAMINOPHEN 5-325 MG PO TABS: 1.0000 | ORAL_TABLET | Freq: Three times a day (TID) | ORAL | 0 refills | Status: DC | PRN

## 2016-08-18 ENCOUNTER — Other Ambulatory Visit: Payer: Self-pay

## 2016-08-18 ENCOUNTER — Encounter: Payer: Self-pay | Admitting: Emergency Medicine

## 2016-08-18 ENCOUNTER — Emergency Department: Payer: 59

## 2016-08-18 ENCOUNTER — Telehealth: Payer: Self-pay | Admitting: *Deleted

## 2016-08-18 ENCOUNTER — Emergency Department
Admission: EM | Admit: 2016-08-18 | Discharge: 2016-08-18 | Disposition: A | Discharge status: Home / Self Care | Payer: 59 | Attending: Emergency Medicine | Admitting: Emergency Medicine

## 2016-08-18 DIAGNOSIS — R0789 Other chest pain: Secondary | ICD-10-CM | POA: Insufficient documentation

## 2016-08-18 DIAGNOSIS — R079 Chest pain, unspecified: Secondary | ICD-10-CM | POA: Diagnosis not present

## 2016-08-18 DIAGNOSIS — K8020 Calculus of gallbladder without cholecystitis without obstruction: Secondary | ICD-10-CM | POA: Diagnosis not present

## 2016-08-18 DIAGNOSIS — R109 Unspecified abdominal pain: Secondary | ICD-10-CM

## 2016-08-18 DIAGNOSIS — M546 Pain in thoracic spine: Secondary | ICD-10-CM | POA: Diagnosis not present

## 2016-08-18 DIAGNOSIS — Z7901 Long term (current) use of anticoagulants: Secondary | ICD-10-CM | POA: Diagnosis not present

## 2016-08-18 DIAGNOSIS — R0602 Shortness of breath: Secondary | ICD-10-CM | POA: Diagnosis not present

## 2016-08-18 LAB — CBC
HCT: 45.9 % (ref 40.0–52.0)
Hemoglobin: 15.3 g/dL (ref 13.0–18.0)
MCH: 30.6 pg (ref 26.0–34.0)
MCHC: 33.3 g/dL (ref 32.0–36.0)
MCV: 92 fL (ref 80.0–100.0)
Platelets: 229 10*3/uL (ref 150–440)
RBC: 4.98 MIL/uL (ref 4.40–5.90)
RDW: 13.6 % (ref 11.5–14.5)
WBC: 13.5 10*3/uL — ABNORMAL HIGH (ref 3.8–10.6)

## 2016-08-18 LAB — COMPREHENSIVE METABOLIC PANEL
ALT: 52 U/L (ref 17–63)
AST: 28 U/L (ref 15–41)
Albumin: 3.6 g/dL (ref 3.5–5.0)
Alkaline Phosphatase: 82 U/L (ref 38–126)
Anion gap: 6 (ref 5–15)
BUN: 13 mg/dL (ref 6–20)
CO2: 23 mmol/L (ref 22–32)
Calcium: 9.4 mg/dL (ref 8.9–10.3)
Chloride: 105 mmol/L (ref 101–111)
Creatinine, Ser: 1.35 mg/dL — ABNORMAL HIGH (ref 0.61–1.24)
GFR calc Af Amer: >60 mL/min (ref >60)
GFR calc non Af Amer: 59 mL/min — ABNORMAL LOW (ref >60)
Glucose, Bld: 114 mg/dL — ABNORMAL HIGH (ref 65–99)
Potassium: 3.7 mmol/L (ref 3.5–5.1)
Sodium: 134 mmol/L — ABNORMAL LOW (ref 135–145)
Total Bilirubin: 0.4 mg/dL (ref 0.3–1.2)
Total Protein: 7.5 g/dL (ref 6.5–8.1)

## 2016-08-18 LAB — TROPONIN I: Troponin I: <0.03 ng/mL (ref <0.03)

## 2016-08-18 LAB — FIBRIN DERIVATIVES D-DIMER (ARMC ONLY): Fibrin derivatives D-dimer (ARMC): 71.06 (ref 0.00–499.00)

## 2016-08-18 MED ORDER — IOPAMIDOL (ISOVUE-370) INJECTION 76%
100.0000 mL | Freq: Once | INTRAVENOUS | Status: AC | PRN
  Administered 2016-08-18: 100 mL via INTRAVENOUS
  Filled 2016-08-18: qty 100

## 2016-08-18 MED ORDER — ONDANSETRON HCL 4 MG/2ML IJ SOLN
4.0000 mg | Freq: Once | INTRAMUSCULAR | Status: AC
  Administered 2016-08-18: 4 mg via INTRAVENOUS
  Filled 2016-08-18: qty 2

## 2016-08-18 MED ORDER — IOPAMIDOL (ISOVUE-300) INJECTION 61%
30.0000 mL | Freq: Once | INTRAVENOUS | Status: AC | PRN
  Administered 2016-08-18: 30 mL via ORAL
  Filled 2016-08-18: qty 30

## 2016-08-18 MED ORDER — MORPHINE SULFATE (PF) 4 MG/ML IV SOLN
4.0000 mg | Freq: Once | INTRAVENOUS | Status: AC
  Administered 2016-08-18: 4 mg via INTRAVENOUS
  Filled 2016-08-18: qty 1

## 2016-08-18 MED ORDER — MORPHINE SULFATE (PF) 4 MG/ML IV SOLN
4.0000 mg | Freq: Once | INTRAVENOUS | Status: AC
  Administered 2016-08-18: 4 mg via INTRAVENOUS

## 2016-08-18 MED ORDER — MORPHINE SULFATE (PF) 4 MG/ML IV SOLN
INTRAVENOUS | Status: AC
  Administered 2016-08-18: 4 mg via INTRAVENOUS
  Filled 2016-08-18: qty 1

## 2016-08-18 NOTE — ED Notes (Signed)
ED Provider at bedside. 

## 2016-08-18 NOTE — Telephone Encounter (Signed)
Asking to be seen due to swelling and pain of the right side of his back like when he had a blood clot. Please advise

## 2016-08-18 NOTE — ED Provider Notes (Signed)
[facility] Emergency Department Provider Note   ____________________________________________   First MD Initiated Contact with Patient 08/18/16 1956     (approximate)  I have reviewed the triage vital signs and the nursing notes.   HISTORY  Chief Complaint Right lower chest and back pain   HPI Justin [name] is a 51 y.o. male reports that he's having pain in his right lower chest on the side and along the back. He feels the area is very painful and swollen. He reports is been ongoing for about a month and a half, he's had extensive evaluations and told he has a gallbladder stone but no one has been able to identify the cause of his pain  He notably has a history of multiple blood clots, is currently on TWICE DAILY INJECTIONS lovenox. \ He denies "chest pain" except for pain which she describes the right lower ribs pointing over approximately 10th  right lateral rib region.  No nausea or vomiting. No fevers or chills. Has seen his doctor for the same in the past, reports and unremitting discomfort and pain in this area.   Past Medical History:  Diagnosis Date  . Arm vein blood clot    right  . DVT (deep venous thrombosis) ([place]) 2012; January 2016   History of right arm; now right lower shotty  . GERD (gastroesophageal reflux disease)   . Rib pain on right side 08/04/2016   RT rib pain that radiates around to the back. Level 8 of 10.  Constant. Started 1 mo ago, 1 week after DVT.  Marland Kitchen Vascular thoracic outlet syndrome    Right     Patient Active Problem List   Diagnosis Date Noted  . Lupus anticoagulant positive 07/08/2016  . DVT (deep venous thrombosis) ([place]) 07/08/2016  . Thoracic outlet syndrome 07/08/2016  . Pain of left scapula 07/05/2016  . Dyspnea 07/05/2016  . Left subclavian vein thrombosis ([place]) 06/29/2016  . Abnormal finding on EKG 10/04/2014  . Atypical chest pain 10/04/2014    Past Surgical History:  Procedure Laterality  Date  . CAROTID ANGIOGRAM    . KNEE CARTILAGE SURGERY     Performed for cracked patella and torn meniscus  . Right upper extremity thrombectomy  2012  . SCALENOTOMY W/O RESECTION CERVICAL RIB  2012   4 right-sided thoracic outlet syndrome    Prior to Admission medications   Medication Sig Start Date End Date Taking? Authorizing Provider  clobetasol ointment (TEMOVATE) 0.92 % Apply 1 application topically 2 (two) times daily. 08/06/16   Volney American, PA-C  enoxaparin (LOVENOX) 150 MG/ML injection Inject 0.74 mLs (110 mg total) into the skin 2 (two) times daily. 08/10/16 08/10/17  Lequita Asal, MD  hydrOXYzine (ATARAX/VISTARIL) 25 MG tablet Take 1 tablet (25 mg total) by mouth 3 (three) times daily as needed. Patient not taking: Reported on 08/06/2016 03/25/16   Volney American, PA-C  lidocaine (LIDODERM) 5 % Place onto the skin. 08/05/16 09/04/16  Historical Provider, MD  OXYCODONE HCL PO Take by mouth.    Historical Provider, MD  oxyCODONE-acetaminophen (ROXICET) 5-325 MG tablet Take 1 tablet by mouth every 8 (eight) hours as needed for severe pain. 08/13/16   Volney American, PA-C  topiramate (TOPAMAX) 50 MG tablet Take 50 mg by mouth daily. 01/28/16 07/08/16  Historical Provider, MD    Allergies Patient has no known allergies.  Family History  Problem Relation Age of Onset  . Heart disease Father   .  Breast cancer Father   . Diabetes Father   . Heart attack Father   . Cancer Maternal Grandmother   . Leukemia Maternal Grandfather     Social History Social History  Substance Use Topics  . Smoking status: Never Smoker  . Smokeless tobacco: Never Used  . Alcohol use No    Review of Systems Constitutional: No fever/chills Eyes: No visual changes. ENT: No sore throat. Cardiovascular: see history of present illness. No left-sided chest pain, no pain radiating to the left arm. No ripping tearing or moving pain. Respiratory: Denies shortness of  breath. Gastrointestinal: No abdominal pain.  No nausea, no vomiting.  No diarrhea.  No constipation. Genitourinary: Negative for dysuria. Musculoskeletal: Negative for back pain. Skin: Negative for rash. Neurological: Negative for headaches, focal weakness or numbness.  10-point ROS otherwise negative.  ____________________________________________   PHYSICAL EXAM:  VITAL SIGNS: ED Triage Vitals  Enc Vitals Group     BP 08/18/16 1552 117/82     Pulse Rate 08/18/16 1552 86     Resp 08/18/16 1551 20     Temp 08/18/16 1552 98.2 F (36.8 C)     Temp Source 08/18/16 1551 Oral     SpO2 08/18/16 1552 99 %     Weight 08/18/16 1552 252 lb (114.3 kg)     Height --      Head Circumference --      Peak Flow --      Pain Score 08/18/16 1556 10     Pain Loc --      Pain Edu? --      Excl. in [place]? --     Constitutional: Alert and oriented.appears uncomfortable, appears to be suffering from pain in his right mid back. Patient and wife both very pleasant. Eyes: Conjunctivae are normal. PERRL. EOMI. Head: Atraumatic. Nose: No congestion/rhinnorhea. Mouth/Throat: Mucous membranes are moist.  Oropharynx non-erythematous. Neck: No stridor.   Cardiovascular: Normal rate, regular rhythm. Grossly normal heart sounds.  Good peripheral circulation. Respiratory: Normal respiratory effort.  No retractions. Lungs CTAB. Gastrointestinal: Soft and nontender.no CVA tenderness. Negative bedside Murphy before morphine was given. Musculoskeletal: No lower extremity tenderness nor edema. Neurologic:  Normal speech and language. No gross focal neurologic deficits are appreciated. Skin:  Skin is warm, dry and intact. No rash noted. Psychiatric: Mood and affect are normal. Speech and behavior are normal.  ____________________________________________   LABS (all labs ordered are listed, but only abnormal results are displayed)  Labs Reviewed  COMPREHENSIVE METABOLIC PANEL - Abnormal; Notable for the  following:       Result Value   Sodium 134 (*)    Glucose, Bld 114 (*)    Creatinine, Ser 1.35 (*)    GFR calc non Af Amer 59 (*)    All other components within normal limits  CBC - Abnormal; Notable for the following:    WBC 13.5 (*)    All other components within normal limits  FIBRIN DERIVATIVES D-DIMER ([facility] ONLY)  TROPONIN I   ____________________________________________  EKG  ED ECG REPORT I, Mckyle Solanki, the attending physician, personally viewed and interpreted this ECG.  Date: 08/18/2016 EKG Time: 1600 Rate: 75 Rhythm: normal sinus rhythm QRS Axis: normal Intervals: normal ST/T Wave abnormalities: normal Conduction Disturbances: none Narrative Interpretation: unremarkable  ____________________________________________  RADIOLOGY  Dg Chest 2 View  Result Date: 08/18/2016 CLINICAL DATA:  Shortness of breath. Previous history of DVT. Right shoulder and arm swelling. EXAM: CHEST  2 VIEW COMPARISON:  07/20/2016 FINDINGS: The  heart size and mediastinal contours are within normal limits. Mild scarring in the left lower lobe is stable. No evidence of pulmonary infiltrate or edema. No evidence of pneumothorax or pleural effusion. The visualized skeletal structures are unremarkable. IMPRESSION: Stable left lower lobe scarring.  No active disease. Electronically Signed   By: Earle Gell M.D.   On: 08/18/2016 16:46   Ct Angio Chest Pe W Or Wo Contrast  Result Date: 08/18/2016 CLINICAL DATA:  Right-sided chest pain. Patient has been diagnosed with multiple blood clots and started on Lovenox. Right upper arm and shoulder pain and swelling with shortness of breath. EXAM: CT ANGIOGRAPHY CHEST CT ABDOMEN AND PELVIS WITH CONTRAST TECHNIQUE: Multidetector CT imaging of the chest was performed using the standard protocol during bolus administration of intravenous contrast. Multiplanar CT image reconstructions and MIPs were obtained to evaluate the vascular anatomy. Multidetector CT  imaging of the abdomen and pelvis was performed using the standard protocol during bolus administration of intravenous contrast. CONTRAST:  100 mL Isovue 370 COMPARISON:  07/20/2016 FINDINGS: CTA CHEST FINDINGS Cardiovascular: Satisfactory opacification of the pulmonary arteries to the segmental level. No evidence of pulmonary embolism. Normal heart size. No pericardial effusion. Normal caliber thoracic aorta with scattered calcification. No aortic dissection. Mediastinum/Nodes: No enlarged mediastinal, hilar, or axillary lymph nodes. Thyroid gland, trachea, and esophagus demonstrate no significant findings. Lungs/Pleura: Motion artifact limits evaluation. There streaking linear opacities in both lung bases, greater on the left, likely representing atelectasis or fibrosis. Few calcified granulomas in the lungs. Tiny subpleural nodules likely representing lymph nodes. No focal consolidation or airspace disease. No pleural effusions. No pneumothorax. Musculoskeletal: No chest wall abnormality. No acute or significant osseous findings. Review of the MIP images confirms the above findings. CT ABDOMEN and PELVIS FINDINGS Hepatobiliary: No focal liver abnormality is seen. No gallstones, gallbladder wall thickening, or biliary dilatation. Pancreas: Unremarkable. No pancreatic ductal dilatation or surrounding inflammatory changes. Spleen: Normal in size without focal abnormality. Adrenals/Urinary Tract: Adrenal glands are unremarkable. Kidneys are normal, without renal calculi, focal solid lesion, or hydronephrosis. Cyst in the right kidney midpole. Bladder is unremarkable. Stomach/Bowel: Stomach is within normal limits. Appendix appears normal. No evidence of bowel wall thickening, distention, or inflammatory changes. Vascular/Lymphatic: Aortic atherosclerosis. No enlarged abdominal or pelvic lymph nodes. Reproductive: Prostate is unremarkable. Other: No free air or free fluid in the abdomen. Abdominal wall musculature  appears intact. Focal opacities and gas in the subcutaneous soft tissues of the anterior abdominal wall consistent with injection sites. Musculoskeletal: No acute or significant osseous findings. Review of the MIP images confirms the above findings. IMPRESSION: No evidence of significant pulmonary embolus. Fibrosis or atelectasis in the lung bases with scattered calcified granulomas. No evidence of active pulmonary disease. No acute process demonstrated in the abdomen or pelvis. No evidence of bowel obstruction or inflammation. Injection sites in the subcutaneous fat of the abdominal wall. Electronically Signed   By: Lucienne Capers M.D.   On: 08/18/2016 21:25   Ct Abdomen Pelvis W Contrast  Result Date: 08/18/2016 CLINICAL DATA:  Right-sided chest pain. Patient has been diagnosed with multiple blood clots and started on Lovenox. Right upper arm and shoulder pain and swelling with shortness of breath. EXAM: CT ANGIOGRAPHY CHEST CT ABDOMEN AND PELVIS WITH CONTRAST TECHNIQUE: Multidetector CT imaging of the chest was performed using the standard protocol during bolus administration of intravenous contrast. Multiplanar CT image reconstructions and MIPs were obtained to evaluate the vascular anatomy. Multidetector CT imaging of the abdomen and pelvis was  performed using the standard protocol during bolus administration of intravenous contrast. CONTRAST:  100 mL Isovue 370 COMPARISON:  07/20/2016 FINDINGS: CTA CHEST FINDINGS Cardiovascular: Satisfactory opacification of the pulmonary arteries to the segmental level. No evidence of pulmonary embolism. Normal heart size. No pericardial effusion. Normal caliber thoracic aorta with scattered calcification. No aortic dissection. Mediastinum/Nodes: No enlarged mediastinal, hilar, or axillary lymph nodes. Thyroid gland, trachea, and esophagus demonstrate no significant findings. Lungs/Pleura: Motion artifact limits evaluation. There streaking linear opacities in both lung  bases, greater on the left, likely representing atelectasis or fibrosis. Few calcified granulomas in the lungs. Tiny subpleural nodules likely representing lymph nodes. No focal consolidation or airspace disease. No pleural effusions. No pneumothorax. Musculoskeletal: No chest wall abnormality. No acute or significant osseous findings. Review of the MIP images confirms the above findings. CT ABDOMEN and PELVIS FINDINGS Hepatobiliary: No focal liver abnormality is seen. No gallstones, gallbladder wall thickening, or biliary dilatation. Pancreas: Unremarkable. No pancreatic ductal dilatation or surrounding inflammatory changes. Spleen: Normal in size without focal abnormality. Adrenals/Urinary Tract: Adrenal glands are unremarkable. Kidneys are normal, without renal calculi, focal solid lesion, or hydronephrosis. Cyst in the right kidney midpole. Bladder is unremarkable. Stomach/Bowel: Stomach is within normal limits. Appendix appears normal. No evidence of bowel wall thickening, distention, or inflammatory changes. Vascular/Lymphatic: Aortic atherosclerosis. No enlarged abdominal or pelvic lymph nodes. Reproductive: Prostate is unremarkable. Other: No free air or free fluid in the abdomen. Abdominal wall musculature appears intact. Focal opacities and gas in the subcutaneous soft tissues of the anterior abdominal wall consistent with injection sites. Musculoskeletal: No acute or significant osseous findings. Review of the MIP images confirms the above findings. IMPRESSION: No evidence of significant pulmonary embolus. Fibrosis or atelectasis in the lung bases with scattered calcified granulomas. No evidence of active pulmonary disease. No acute process demonstrated in the abdomen or pelvis. No evidence of bowel obstruction or inflammation. Injection sites in the subcutaneous fat of the abdominal wall. Electronically Signed   By: Lucienne Capers M.D.   On: 08/18/2016 21:25   US Abdomen Limited Ruq  Result Date:  08/18/2016 CLINICAL DATA:  Right-sided abdominal pain, shortness of breath, and chest pain for 1 day. EXAM: US ABDOMEN LIMITED - RIGHT UPPER QUADRANT COMPARISON:  CT abdomen and pelvis 08/18/2016 FINDINGS: Gallbladder: Cholelithiasis with single stone measuring up to 2.3 cm diameter. No gallbladder wall thickening or edema. Murphy's sign is negative. Common bile duct: Diameter: 4.8 mm, normal Liver: No focal lesion identified. Within normal limits in parenchymal echogenicity. Incidental note of a simple appearing cyst in the midpole right kidney measuring 2.3 cm maximal diameter. IMPRESSION: Cholelithiasis.  No evidence of cholecystitis. Electronically Signed   By: Lucienne Capers M.D.   On: 08/18/2016 22:41    ____________________________________________   PROCEDURES  Procedure(s) performed: None  Procedures  Critical Care performed: No  ____________________________________________   INITIAL IMPRESSION / ASSESSMENT AND PLAN / ED COURSE  Pertinent labs & imaging results that were available during my care of the patient were reviewed by me and considered in my medical decision making (see chart for details).  Patient with ongoing, potentially worsening pain over the right lateral chest wall and back. This had somewhat extensive evaluations without any clear cause the past. Hemodynamically stable. He does have a complex medical history, and based upon this and location of pain we wish to exclude recurrent pulmonary embolism but also further evaluate for abdominal pathology or other cause such as pulmonary disease/pneumonia, ACS, etc.  Atypical symptoms for  acute coronary syndrome. No focal abdominal discomfort on exam. Labs reassuring except for slightly elevated white count which may be due to inflammatory, infectious or reactive process. Somewhat unclear.  The patient's CT scans were negative for acute pathology. He does have a history of known gallstones documented, and given this obtain  ultrasound to exclude other notable upper quadrant pathology. LFTs reassuring.  ----------------------------------------- 10:31 PM on 08/18/2016 -----------------------------------------  Patient return back from [place]. Pain improved. Resting comfortably.  Patient reports pain much better. Appears well and in no distress.  ----------------------------------------- 10:54 PM on 08/18/2016 -----------------------------------------  Return precautions and treatment recommendations and follow-up discussed with the patient who is agreeable with the plan.         ____________________________________________   FINAL CLINICAL IMPRESSION(S) / ED DIAGNOSES  Final diagnoses:  Right-sided chest pain  Abdominal pain  Right-sided thoracic back pain, unspecified chronicity  Calculus of gallbladder without cholecystitis without obstruction      NEW MEDICATIONS STARTED DURING THIS VISIT:  New Prescriptions   No medications on file     Note:  This document was prepared using Dragon voice recognition software and may include unintentional dictation errors.     Delman Kitten, MD 08/18/16 [phone]

## 2016-08-18 NOTE — Discharge Instructions (Addendum)
Please follow up with the recommended doctor as instructed above in these documents regarding today?s emergent visit and your recent symptoms to discuss further management.  Continue to take your regular medications.  Return to the Emergency Department (ED) if you experience any worsening or further chest pain/pressure/tightness, vomiting, fever, difficulty breathing, or sudden sweating, or other symptoms that concern you.  No driving tonight.

## 2016-08-18 NOTE — ED Notes (Signed)
Patient returned from [place].

## 2016-08-18 NOTE — Telephone Encounter (Signed)
Asked Justin [name] that per Dr Humberto Seals, he is to go to the ER for evaluation and testing. She stated she will take him

## 2016-08-18 NOTE — ED Notes (Signed)
Patient transported to CT 

## 2016-08-18 NOTE — ED Triage Notes (Signed)
Pt dx with multiple blood clots and started on lovenox injections for same. Pt c/o right upper arm and shoulder pain and swelling with some shorntess of breath.

## 2016-08-18 NOTE — Progress Notes (Signed)
   BP 118/76   Pulse 76   Temp 98 F (36.7 C)   Wt 252 lb (114.3 kg)   SpO2 100%   BMI 35.15 kg/m    Subjective:    Patient ID: Justin [name], male    DOB: 09/03/1965, 51 y.o.   MRN: [id number]  HPI: Justin [name] is a 51 y.o. male  Chief Complaint  Patient presents with  . Follow-up    Still hurting, not any better. Also needs disability paperwork completed.   Patient presents with disability paperwork that needs to be filled out. No change in his condition regarding RUQ pain, awaiting specialist referral. Rheumatology has asked that he see pulmonology first, so that appt is scheduled for mid-April. Will send him to Physiatrist in the meantime to see if any sort of pain relief options exist as lidocaine patches and percocet barely help.   Relevant past medical, surgical, family and social history reviewed and updated as indicated. Interim medical history since our last visit reviewed. Allergies and medications reviewed and updated.  Review of Systems  Constitutional: Negative.   HENT: Negative.   Eyes: Negative.   Respiratory: Negative.   Cardiovascular: Negative.   Gastrointestinal: Positive for abdominal pain.  Genitourinary: Negative.   Musculoskeletal: Negative.   Neurological: Negative.   Psychiatric/Behavioral: Negative.     Per HPI unless specifically indicated above     Objective:    BP 118/76   Pulse 76   Temp 98 F (36.7 C)   Wt 252 lb (114.3 kg)   SpO2 100%   BMI 35.15 kg/m   Wt Readings from Last 3 Encounters:  08/13/16 252 lb (114.3 kg)  08/06/16 256 lb (116.1 kg)  08/05/16 255 lb 11.7 oz (116 kg)    Physical Exam  Constitutional: He is oriented to person, place, and time. He appears well-developed and well-nourished.  HENT:  Head: Atraumatic.  Right Ear: External ear normal.  Left Ear: External ear normal.  Nose: Nose normal.  Mouth/Throat: Oropharynx is clear and moist. No oropharyngeal exudate.  Eyes: Conjunctivae are normal.  Pupils are equal, round, and reactive to light. No scleral icterus.  Neck: Normal range of motion. Neck supple.  Cardiovascular: Normal rate and normal heart sounds.   Pulmonary/Chest: Effort normal and breath sounds normal. No respiratory distress.  Abdominal: There is tenderness (Severe ttp RUQ).  Musculoskeletal: Normal range of motion.  Neurological: He is alert and oriented to person, place, and time.  Skin: Skin is warm and dry.  Psychiatric: He has a normal mood and affect. His behavior is normal.  Nursing note and vitals reviewed.     Assessment & Plan:   Problem List Items Addressed This Visit    None    Visit Diagnoses    RUQ abdominal pain    -  Primary   No change, physiatry referral placed. Await upcoming consults. Percocet refilled. Disability forms filled out, see scanned copy.    Relevant Orders   Ambulatory referral to Physical Medicine Rehab       Follow up plan: Return if symptoms worsen or fail to improve.

## 2016-08-18 NOTE — ED Notes (Signed)
Patient transported to Ultrasound 

## 2016-08-18 NOTE — Telephone Encounter (Signed)
Per MD have patient go to ED for CT/angiogram.  Not able to determine in clinic if he has a clot.

## 2016-08-18 NOTE — Patient Instructions (Signed)
Follow up as needed

## 2016-08-19 ENCOUNTER — Ambulatory Visit (INDEPENDENT_AMBULATORY_CARE_PROVIDER_SITE_OTHER): Payer: 59 | Admitting: Physical Medicine and Rehabilitation

## 2016-08-19 ENCOUNTER — Ambulatory Visit: Payer: 59 | Admitting: Family Medicine

## 2016-08-19 ENCOUNTER — Encounter (INDEPENDENT_AMBULATORY_CARE_PROVIDER_SITE_OTHER): Payer: Self-pay | Admitting: Physical Medicine and Rehabilitation

## 2016-08-19 VITALS — BP 112/65 | HR 56

## 2016-08-19 DIAGNOSIS — R0781 Pleurodynia: Secondary | ICD-10-CM | POA: Diagnosis not present

## 2016-08-19 DIAGNOSIS — M5414 Radiculopathy, thoracic region: Secondary | ICD-10-CM

## 2016-08-19 DIAGNOSIS — G588 Other specified mononeuropathies: Secondary | ICD-10-CM | POA: Diagnosis not present

## 2016-08-19 NOTE — Patient Instructions (Signed)
Intercostal Nerve Block Patient Information  Description: The twelve intercostal nerves arise from the first thru twelfth thoracic nerve roots.  The nerve begins at the spine and wraps around the body, lying in a groove underneath each rib.  Each intercostal nerve innervates a specific strip of skin and body walk of the abdomen and chest.  Therefore, injuries of the chest wall or abdominal wall result in pain that is transmitted back to the brian via the intercostal nerves.  Examples of such injuries include rib fractures and incisions for lung and gall bladder surgery.  Occasionally, pain may persist long after an injury or surgical incision secondary to inflammation and irritation of the intercostal nerve.  The longstanding pain is known as intercostal neuralgia.  An intercostal nerve block is preformed to eliminate pain either temporarily or permanently.  A small needle is placed below the rib and local anesthetic (like Novocaine) and possibly steroid is injected.  Usually 2-4 intercostal nerves are blocked at a time depending on the problem.  The patient will experience a slight "pin-prick" sensation for each injection.  Shortly thereafter, the strip of skin that is innervated by the blocked intercostal nerve will feel numb.  Persistent pain that is only temporarily relieved with local anesthetic may require a more permanent block. This procedure is called Cryoneurolysis and entails placing a small probe beneath the rib to freeze the nerve.  Conditions that may be treated by intercostal nerve blocks:   Rib fractures  Longstanding pain from surgery of the chest or abdomen (intercostal neuralgia)  Pain from chest tubes  Pain from trauma to the chest  Preparation for the injections:  1. Do not eat any solid food or dairy products within 8 hours of your appointment. 2. You may drink clear liquids up to 3 hours before appointment.  Clear liquids include water, black coffee, juice or soda.  No milk  or cream please. 3. You may take your regular medication, including pain medications, with a sip of water before your appointment.  Diabetics should hold regular insulin (if take separately) and take 1/2 normal NPH dose the morning of the procedure.   Carry some sugar containing items with you to your appointment. 4. A driver must accompany you and be prepared to drive you home after your procedure. 5. Bring all your current medications with you. 6. An IV may be inserted and sedation may be given at the discretion of the physician. 7. A blood pressure cuff, EKG and other monitors will often be applied during the procedure.  Some patients may need to have extra oxygen administered for a short period. 8. You will be asked to provide medical information, including your allergies, prior to the procedure.  We must know immediately if you are taking blood thinners (like Coumadin/Warfarin) or if you are allergic to IV iodine contrast (dye). We must know if you could possible be pregnant.  Possible side-effects:   Bleeding from needle site  Infection (rare)  Nerve injury (rare)  Numbness & tingling of skin  Collapsed lung requiring chest tube (rare)  Local anesthetic toxicity (rare)  Light-headedness (temporary)  Pain at injection site (several days)  Decreased blood pressure (temporary)  Shortness of breath  Jittery/shaking sensation (temporary)  Call if you experience:   Difficulty breathing or hives (go directly to the emergency room)  Redness, inflammation or drainage at the injection site  Severe pain at the site of the injection  Any new symptoms which are concerning   Please note:  Your pain may subside immediately but may return several hours after the injection.  Often, more than one injection is required to reduce the pain. Also, if several temporary blocks with local anesthetic are ineffective, a more permanent block with cryolysis may be necessary.  This will be  discussed with you should this be the case.  If you have any questions, please call [phone] [facility]

## 2016-08-19 NOTE — Progress Notes (Signed)
Justin [name] - 51 y.o. male MRN [id number]  Date of birth: January 11, 1966  Office Visit Note: Visit Date: 08/19/2016 PCP: Park Liter, DO Referred by: Valerie Roys, DO  Subjective: No chief complaint on file.  HPI: [name] is a 51 year old right-hand-dominant gentleman kindly sent to [place] for consultation by Volney American, PA-C at [facility] for 6 weeks of severe right mid to lower rib pain lateral and posterior and mid axillary. He does not note any specific trauma. He states he just woke up in the pain began to worsen. He reports that the pain is probably a 7 out of 10 and is constant. He does take oxycodone at this point which is about the only thing that relieves any of the pain and this takes the edge off. He does get a paresthesia-type feeling or feeling that he just can't explain this seems to radiate from the thoracic spine around the rib. This is in about the T10 area in terms of where it radiates. He has no left-sided complaints. It is worse with breathing and inhalation. He denies any symptoms in the arms or legs. He has had no previous specific thoracic pain. He has had pain in a similar fashion when he has had blood clots. He has lupus anticoagulant and is on Lovenox twice per day. He has had history of blood clotting with PE as well as subclavian vein thrombosis. He has a remote history of thoracic outlet syndrome. He has had multiple visits to his primary care physician as well as the emergency department for evaluation and management. He had a full workup with CT angiogram and CT abdomen and pelvis in February along with ultrasound and nuclear medicine study. The nuclear medicine study dated 08/04/2016 was most interesting because they did find uptake in the paravertebral area on the right at around T9. The radiologist did look at the prior CT scan of the chest and noted that on February 13 there was a large right-sided osteophyte at the T9-10 level which  was felt to explain the increased uptake. The patient has not had an MRI of the thoracic spine. In between that workup and seeing me he had another CT angiogram abdomen and pelvis CT which again does not show anything related to the lungs or any active clot process. Other ultrasound performed as well which was negative except for he does have gallstones but no specific cholecystitis. He has been trying some topical Lidoderm patches as well as some form of a steroid cream. They state that the Lidoderm patches did not help. They did place the Lidoderm patches however mid axillary area where his pain is most predominant.    Right mid to lower rib pain- lateral and posterior. Pain present for about 6 weeks. Pain is at about a 7 right now. Pain is constant. Takes Oxycodone, which is the only thing that relieves the pain right now. Review of Systems  Constitutional: Negative for chills, fever, malaise/fatigue and weight loss.  HENT: Negative for hearing loss and sinus pain.   Eyes: Negative for blurred vision, double vision and photophobia.  Respiratory: Negative for cough and shortness of breath.   Cardiovascular: Negative for chest pain, palpitations and leg swelling.  Gastrointestinal: Negative for abdominal pain, nausea and vomiting.  Genitourinary: Negative for flank pain.  Musculoskeletal: Positive for back pain. Negative for myalgias.  Skin: Negative for itching and rash.  Neurological: Negative for tremors, focal weakness and weakness.  Endo/Heme/Allergies: Negative.   Psychiatric/Behavioral:  Negative for depression.  All other systems reviewed and are negative.  Otherwise per HPI.  Assessment & Plan: Visit Diagnoses:  1. Rib pain on right side   2. Thoracic radiculopathy   3. Intercostal neuritis     Plan: Findings:  6 weeks of now constant severe pain worse with movement and inhalation located predominantly at the mid axillary line but it can radiate more to the front and more to the  back along approximately at T10 may be T9 dermatome. He has findings of large osteophyte paraspinally at the T9 and T10 level. I reviewed the latest CT scan of the chest and abdomen and pelvis it appears to me that there is no focal narrowing of the foramen from a bony standpoint. There is clearly a large osteophyte present. He could be getting irritation of the intercostal nerve at this point. This could almost be some type of a complex regional pain syndrome although he doesn't meet all the criteria for the [place] criteria. Primary diagnosis is intercostal neuritis followed by potentially T9 or T10 radiculitis. His case is very complicated that he does have a significant clotting disorder and is on Lovenox twice per day. I think the best approach is intercostal nerve block at T9 and T10 and just see if he gets any relief with this. This could be done with him on the blood thinner. Depending on that relief we would either look at an MRI scan of the thoracic spine to see if there is anything from a soft tissue standpoint or nerve impingement standpoint. We could contemplate a nerve root block at these levels as well. We likely wouldn't do that with him on the blood thinner or at least only have him take the Lovenox for his second dose after we completed the injection earlier in the day. I spent more than 35 minutes speaking face-to-face with the patient with 50% of the time in counseling.    Meds & Orders: No orders of the defined types were placed in this encounter.  No orders of the defined types were placed in this encounter.   Follow-up: Return for intercostal nerve block T9 or 10.   Procedures: No procedures performed  No notes on file   Clinical History: CT Angio and CT ABD/PELVIS 08/18/2016 IMPRESSION: No evidence of significant pulmonary embolus. Fibrosis or atelectasis in the lung bases with scattered calcified granulomas. No evidence of active pulmonary disease.  No acute process  demonstrated in the abdomen or pelvis. No evidence of bowel obstruction or inflammation. Injection sites in the subcutaneous fat of the abdominal wall.  RUQ [place] ABD 08/18/2016 IMPRESSION: Cholelithiasis. No evidence of cholecystitis.   Nuclear medicine bone scan 08/04/2016 IMPRESSION: There is a focus of moderately increased uptake at approximately the T9 right paravertebral region. On the chest CT scan dated July 20, 2016 a large right sided osteophyte was noted at the T9-T10 level which is felt to explain the increased right paravertebral uptake uptake.  There is also mildly increased uptake in the body of L2. Review of the lumbar spine on the abdominal CT scan of the same date reveals no definite radiographic abnormality in the body of L2 to explain the mildly increased uptake.  No abnormal uptake within the ribs or elsewhere within the skeleton.  He reports that he has never smoked. He has never used smokeless tobacco. No results for input(s): HGBA1C, LABURIC in the last 8760 hours.  Objective:  VS:  HT:    WT:   BMI:  BP:112/65  HR:(!) 56bpm  TEMP: ( )  RESP:  Physical Exam  Constitutional: He is oriented to person, place, and time. He appears well-developed and well-nourished. No distress.  HENT:  Head: Normocephalic and atraumatic.  Nose: Nose normal.  Mouth/Throat: Oropharynx is clear and moist.  Eyes: Conjunctivae are normal. Pupils are equal, round, and reactive to light.  Neck: Normal range of motion. Neck supple.  Cardiovascular: Regular rhythm and intact distal pulses.   Pulmonary/Chest: Effort normal and breath sounds normal.  Abdominal: Soft. He exhibits no distension. There is tenderness. There is no guarding.  Tenderness to even light palpation of the skin along the mid axillary line at about the T10 dermatome  Musculoskeletal: He exhibits no deformity.  Patient is somewhat slow to rise from a seated position. Ambulates without aid. He has good  distal strength without clonus. He has no pain with hip rotation. Upper extremity exam shows good strength bilaterally without any deficits. He has a negative Hoffmann's test bilaterally. Examination of the thoracic spine does not show any deformities or scoliosis. He is tender to even light palpation along the scan with almost some allodynia or hyperesthesia in the mid axillary area or his pain is most prominent to him. There was no rash present and there is pain with palpating on the rib but is tender in general. There is pain with palpation of the paraspinal musculature. No focal trigger points.  Neurological: He is alert and oriented to person, place, and time. He exhibits normal muscle tone. Coordination normal.  Skin: Skin is warm. No rash noted.  Psychiatric: He has a normal mood and affect. His behavior is normal.  Nursing note and vitals reviewed.   Ortho Exam Imaging: No results found.  Past Medical/Family/Surgical/Social History: Medications & Allergies reviewed per EMR Patient Active Problem List   Diagnosis Date Noted  . Lupus anticoagulant positive 07/08/2016  . DVT (deep venous thrombosis) ([place]) 07/08/2016  . Thoracic outlet syndrome 07/08/2016  . Pain of left scapula 07/05/2016  . Dyspnea 07/05/2016  . Left subclavian vein thrombosis ([place]) 06/29/2016  . Abnormal finding on EKG 10/04/2014  . Atypical chest pain 10/04/2014   Past Medical History:  Diagnosis Date  . Arm vein blood clot    right  . DVT (deep venous thrombosis) ([place]) 2012; January 2016   History of right arm; now right lower shotty  . GERD (gastroesophageal reflux disease)   . Rib pain on right side 08/04/2016   RT rib pain that radiates around to the back. Level 8 of 10.  Constant. Started 1 mo ago, 1 week after DVT.  Marland Kitchen Vascular thoracic outlet syndrome    Right    Family History  Problem Relation Age of Onset  . Heart disease Father   . Breast cancer Father   . Diabetes Father   . Heart attack  Father   . Cancer Maternal Grandmother   . Leukemia Maternal Grandfather    Past Surgical History:  Procedure Laterality Date  . CAROTID ANGIOGRAM    . KNEE CARTILAGE SURGERY     Performed for cracked patella and torn meniscus  . Right upper extremity thrombectomy  2012  . SCALENOTOMY W/O RESECTION CERVICAL RIB  2012   4 right-sided thoracic outlet syndrome   Social History   Occupational History  .      [employer]   Social History Main Topics  . Smoking status: Never Smoker  . Smokeless tobacco: Never Used  . Alcohol use No  .  Drug use: No  . Sexual activity: Not on file

## 2016-08-23 ENCOUNTER — Encounter (INDEPENDENT_AMBULATORY_CARE_PROVIDER_SITE_OTHER): Payer: Self-pay | Admitting: Physical Medicine and Rehabilitation

## 2016-08-24 ENCOUNTER — Ambulatory Visit (INDEPENDENT_AMBULATORY_CARE_PROVIDER_SITE_OTHER): Payer: 59

## 2016-08-24 ENCOUNTER — Encounter (INDEPENDENT_AMBULATORY_CARE_PROVIDER_SITE_OTHER): Payer: Self-pay | Admitting: Physical Medicine and Rehabilitation

## 2016-08-24 ENCOUNTER — Ambulatory Visit (INDEPENDENT_AMBULATORY_CARE_PROVIDER_SITE_OTHER): Payer: 59 | Admitting: Physical Medicine and Rehabilitation

## 2016-08-24 VITALS — BP 120/69 | HR 92 | Temp 98.0°F

## 2016-08-24 DIAGNOSIS — G8929 Other chronic pain: Secondary | ICD-10-CM

## 2016-08-24 DIAGNOSIS — G588 Other specified mononeuropathies: Secondary | ICD-10-CM

## 2016-08-24 DIAGNOSIS — M546 Pain in thoracic spine: Secondary | ICD-10-CM

## 2016-08-24 MED ORDER — METHYLPREDNISOLONE ACETATE 80 MG/ML IJ SUSP: 80.0000 mg | Freq: Once | INTRAMUSCULAR | Status: DC

## 2016-08-24 MED ORDER — LIDOCAINE HCL (PF) 1 % IJ SOLN: 0.3300 mL | Freq: Once | INTRAMUSCULAR | Status: DC

## 2016-08-24 NOTE — Patient Instructions (Signed)

## 2016-08-24 NOTE — Progress Notes (Signed)
Justin [name] - 51 y.o. male MRN [id number]  Date of birth: 1966-05-14  Office Visit Note: Visit Date: 08/24/2016 PCP: Park Liter, DO Referred by: Valerie Roys, DO  Subjective: Chief Complaint  Patient presents with  . Middle Back - Pain   HPI: Justin [name] is a 51 year old gentleman here today for planned intercostal nerve block. States he had no change in symptoms. Since I last saw him I did review the latest CT scan of the chest as well as the nuclear medicine scan. We are going to go ahead and complete intercostal blocks at T9 and T10. This osteophytic area is between the lower part of the T9 vertebral body in the upper part of the T10. Depending on the results I would favor an MRI of the thoracic spine for detailed look at any nerve compression in this area. Conversely interventional procedure could be performed at that area with essentially a nerve root block. His case is complicated by chronic Lovenox anticoagulation.    ROS Otherwise per HPI.  Assessment & Plan: Visit Diagnoses:  1. Intercostal neuritis   2. Chronic right-sided thoracic back pain     Plan: Findings:  Right T9 and T10 intercostal nerve block. The patient didn't seem to have much relief during the anesthetic portion of the injection.    Meds & Orders:  Meds ordered this encounter  Medications  . lidocaine (PF) (XYLOCAINE) 1 % injection 0.3 mL  . methylPREDNISolone acetate (DEPO-MEDROL) injection 80 mg    Orders Placed This Encounter  Procedures  . Nerve Block  . XR C-ARM NO REPORT    Follow-up: Return if symptoms worsen or fail to improve.   Procedures: No procedures performed  Intercostal Nerve Blockade with Fluoroscopic Guidance  Patient: Justin [name]      Date of Birth: 1966-03-10 MRN: [id number] PCP: Park Liter, DO      Visit Date: 08/24/2016   Universal Protocol:    Date/Time: 03/21/188:16 AM  Consent Given By: the patient  Position: prone  Additional  Comments: Vital signs were monitored before and after the procedure. Patient was prepped and draped in the usual sterile fashion. The correct patient, procedure, and site was verified.   Injection Procedure Details:  Procedure Site One Meds Administered:  Meds ordered this encounter  Medications  . lidocaine (PF) (XYLOCAINE) 1 % injection 0.3 mL  . methylPREDNISolone acetate (DEPO-MEDROL) injection 80 mg     Laterality: Right  Location/Site:  T9-10 T10-11  Needle size: 22 G  Needle type: spinal needle  Needle Placement: Inferior rib margin  Findings:  -Contrast Used: 2 mL iohexol 180 mg iodine/mL   -Comments: Good day some flow of contrast underneath the rib margin  Procedure Details: The fluoroscope was positioned in a 90 degree anteroposterior (AP) fluoroscopic view and then the fluoroscope's image intensifier was tilted caudally 15 to 20 degrees from the true AP.  The target area between 3 inches lateral to the spine and the posterior axillary line was visualized and anesthetized with 1 to 2 ml of 1% Lidocaine without epinephrine. A 22 gauge spinal needle was then advanced in trajectory toward the inferior aspect of the intended costal margin. After contacting periosteum, the needle is "walked off" of the inferior border of the rib with an effort to maintain an inferior-to-superior needle trajectory until the needle "steps off" of the rib margin. The depth was no more than 2 to 3 mm past the rib margin to avoid a potential pneumothorax.  Aspiration was confirmed to be negative for air and/or blood. A 1-2 ml. volume of Omnipaque-240 was injected and flow of contrast was noted. Radiographs were obtained for documentation purposes.    Additional Comments:  The patient tolerated the procedure well No complications occurred Dressing: Band-Aid    Post-procedure details: Patient was observed during the procedure. Post-procedure instructions were reviewed.  Patient left the  clinic in stable condition.   Clinical History: CT Angio and CT ABD/PELVIS 08/18/2016 IMPRESSION: No evidence of significant pulmonary embolus. Fibrosis or atelectasis in the lung bases with scattered calcified granulomas. No evidence of active pulmonary disease.  No acute process demonstrated in the abdomen or pelvis. No evidence of bowel obstruction or inflammation. Injection sites in the subcutaneous fat of the abdominal wall.  RUQ [place] ABD 08/18/2016 IMPRESSION: Cholelithiasis. No evidence of cholecystitis.   Nuclear medicine bone scan 08/04/2016 IMPRESSION: There is a focus of moderately increased uptake at approximately the T9 right paravertebral region. On the chest CT scan dated July 20, 2016 a large right sided osteophyte was noted at the T9-T10 level which is felt to explain the increased right paravertebral uptake uptake.  There is also mildly increased uptake in the body of L2. Review of the lumbar spine on the abdominal CT scan of the same date reveals no definite radiographic abnormality in the body of L2 to explain the mildly increased uptake.  No abnormal uptake within the ribs or elsewhere within the skeleton.  He reports that he has never smoked. He has never used smokeless tobacco. No results for input(s): HGBA1C, LABURIC in the last 8760 hours.  Objective:  VS:  HT:    WT:   BMI:     BP:120/69  HR:92bpm  TEMP:98 F (36.7 C)( )  RESP:100 % Physical Exam  Pulmonary/Chest: Effort normal. No respiratory distress. He has no wheezes.  Musculoskeletal:  Patient ambulates with a forward flexed spine with leftward lean.    Ortho Exam Imaging: Xr C-arm No Report  Result Date: 08/24/2016 Please see Notes or Procedures tab for imaging impression.   Past Medical/Family/Surgical/Social History: Medications & Allergies reviewed per EMR Patient Active Problem List   Diagnosis Date Noted  . Lupus anticoagulant positive 07/08/2016  . DVT (deep venous  thrombosis) ([place]) 07/08/2016  . Thoracic outlet syndrome 07/08/2016  . Pain of left scapula 07/05/2016  . Dyspnea 07/05/2016  . Left subclavian vein thrombosis ([place]) 06/29/2016  . Abnormal finding on EKG 10/04/2014  . Atypical chest pain 10/04/2014   Past Medical History:  Diagnosis Date  . Arm vein blood clot    right  . DVT (deep venous thrombosis) ([place]) 2012; January 2016   History of right arm; now right lower shotty  . GERD (gastroesophageal reflux disease)   . Rib pain on right side 08/04/2016   RT rib pain that radiates around to the back. Level 8 of 10.  Constant. Started 1 mo ago, 1 week after DVT.  Marland Kitchen Vascular thoracic outlet syndrome    Right    Family History  Problem Relation Age of Onset  . Heart disease Father   . Breast cancer Father   . Diabetes Father   . Heart attack Father   . Cancer Maternal Grandmother   . Leukemia Maternal Grandfather    Past Surgical History:  Procedure Laterality Date  . CAROTID ANGIOGRAM    . KNEE CARTILAGE SURGERY     Performed for cracked patella and torn meniscus  . Right upper extremity thrombectomy  2012  .  SCALENOTOMY W/O RESECTION CERVICAL RIB  2012   4 right-sided thoracic outlet syndrome   Social History   Occupational History  .      [employer]   Social History Main Topics  . Smoking status: Never Smoker  . Smokeless tobacco: Never Used  . Alcohol use No  . Drug use: No  . Sexual activity: Not on file

## 2016-08-25 NOTE — Procedures (Signed)
Intercostal Nerve Blockade with Fluoroscopic Guidance  Patient: Justin [name]      Date of Birth: 08/25/1965 MRN: [id number] PCP: Park Liter, DO      Visit Date: 08/24/2016   Universal Protocol:    Date/Time: 03/21/188:16 AM  Consent Given By: the patient  Position: prone  Additional Comments: Vital signs were monitored before and after the procedure. Patient was prepped and draped in the usual sterile fashion. The correct patient, procedure, and site was verified.   Injection Procedure Details:  Procedure Site One Meds Administered:  Meds ordered this encounter  Medications  . lidocaine (PF) (XYLOCAINE) 1 % injection 0.3 mL  . methylPREDNISolone acetate (DEPO-MEDROL) injection 80 mg     Laterality: Right  Location/Site:  T9-10 T10-11  Needle size: 22 G  Needle type: spinal needle  Needle Placement: Inferior rib margin  Findings:  -Contrast Used: 2 mL iohexol 180 mg iodine/mL   -Comments: Good day some flow of contrast underneath the rib margin  Procedure Details: The fluoroscope was positioned in a 90 degree anteroposterior (AP) fluoroscopic view and then the fluoroscope's image intensifier was tilted caudally 15 to 20 degrees from the true AP.  The target area between 3 inches lateral to the spine and the posterior axillary line was visualized and anesthetized with 1 to 2 ml of 1% Lidocaine without epinephrine. A 22 gauge spinal needle was then advanced in trajectory toward the inferior aspect of the intended costal margin. After contacting periosteum, the needle is "walked off" of the inferior border of the rib with an effort to maintain an inferior-to-superior needle trajectory until the needle "steps off" of the rib margin. The depth was no more than 2 to 3 mm past the rib margin to avoid a potential pneumothorax.   Aspiration was confirmed to be negative for air and/or blood. A 1-2 ml. volume of Omnipaque-240 was injected and flow of contrast was noted.  Radiographs were obtained for documentation purposes.    Additional Comments:  The patient tolerated the procedure well No complications occurred Dressing: Band-Aid    Post-procedure details: Patient was observed during the procedure. Post-procedure instructions were reviewed.  Patient left the clinic in stable condition.

## 2016-08-30 ENCOUNTER — Telehealth (INDEPENDENT_AMBULATORY_CARE_PROVIDER_SITE_OTHER): Payer: Self-pay | Admitting: Physical Medicine and Rehabilitation

## 2016-08-30 DIAGNOSIS — M546 Pain in thoracic spine: Secondary | ICD-10-CM

## 2016-08-30 DIAGNOSIS — M5414 Radiculopathy, thoracic region: Secondary | ICD-10-CM

## 2016-08-30 NOTE — Telephone Encounter (Signed)
Patient called wanting to talk with you about his procedure went. CB # [phone]

## 2016-08-30 NOTE — Telephone Encounter (Signed)
MRI of thoracic spine with focus on the osteophyte T T9/T10, if you talk to them and they want to proceed send to me and I will order it

## 2016-08-31 NOTE — Telephone Encounter (Signed)
Patient wants to proceed with MRI- prefers [facility] or [place].

## 2016-08-31 NOTE — Telephone Encounter (Signed)
Left message for pt to call back to discuss

## 2016-09-01 ENCOUNTER — Encounter: Payer: Self-pay | Admitting: Family Medicine

## 2016-09-01 ENCOUNTER — Ambulatory Visit (INDEPENDENT_AMBULATORY_CARE_PROVIDER_SITE_OTHER): Payer: 59 | Admitting: Family Medicine

## 2016-09-01 VITALS — BP 115/72 | HR 71 | Temp 98.1°F | Wt 251.0 lb

## 2016-09-01 DIAGNOSIS — M79601 Pain in right arm: Secondary | ICD-10-CM | POA: Diagnosis not present

## 2016-09-01 NOTE — Patient Instructions (Signed)
Follow up as needed

## 2016-09-01 NOTE — Addendum Note (Signed)
Addended by: Raymondo Band on: 09/01/2016 08:46 AM   Modules accepted: Orders

## 2016-09-01 NOTE — Progress Notes (Signed)
BP 115/72   Pulse 71   Temp 98.1 F (36.7 C)   Wt 251 lb (113.9 kg)   SpO2 99%   BMI 35.01 kg/m    Subjective:    Patient ID: Justin [name], male    DOB: August 24, 1965, 51 y.o.   MRN: [id number]  HPI: Justin [name] is a 51 y.o. male  Chief Complaint  Patient presents with  . Shoulder Pain    2 days ago got a real loud buzzing sound in his head, then it went away and developed a h/a. Then the next morning he woke up with his right upper arm swollen and painful. Swelling has went down but it's still aching.    Patient presents with 2 day history of severe right UE/shoulder pain and swelling. Initially wasn't able to do any sort of movement with the arm d/t pain. Sxs have eased off some, but still hurting significantly with some remaining edema. Denies injury, wounds, erythema, warmth, neurologic deficits. Hx of multiple UE DVTs b/l currently on lovenox injections. Followed by Hematology and Vascular.   Relevant past medical, surgical, family and social history reviewed and updated as indicated. Interim medical history since our last visit reviewed. Allergies and medications reviewed and updated.  Review of Systems  Constitutional: Negative.   HENT: Negative.   Respiratory: Negative.  Negative for shortness of breath.   Cardiovascular: Negative.  Negative for chest pain.  Gastrointestinal: Negative.   Genitourinary: Negative.   Musculoskeletal: Positive for arthralgias and joint swelling.  Neurological: Negative.   Psychiatric/Behavioral: Negative.     Per HPI unless specifically indicated above     Objective:    BP 115/72   Pulse 71   Temp 98.1 F (36.7 C)   Wt 251 lb (113.9 kg)   SpO2 99%   BMI 35.01 kg/m   Wt Readings from Last 3 Encounters:  09/01/16 251 lb (113.9 kg)  08/18/16 252 lb (114.3 kg)  08/13/16 252 lb (114.3 kg)    Physical Exam  Constitutional: He is oriented to person, place, and time. He appears well-developed and well-nourished. No  distress.  HENT:  Head: Atraumatic.  Eyes: Conjunctivae are normal. Pupils are equal, round, and reactive to light. No scleral icterus.  Neck: Normal range of motion. Neck supple.  Cardiovascular: Normal rate and normal heart sounds.   Pulmonary/Chest: Effort normal and breath sounds normal. No respiratory distress.  Musculoskeletal: He exhibits edema (mild edema of right upper arm and shoulder) and tenderness (Tender to firm palpation over right shoulder and upper arm).  ROM intact but limited by pain Strength intact  Neurological: He is alert and oriented to person, place, and time.  Skin: Skin is warm and dry. No erythema.  Psychiatric: He has a normal mood and affect. His behavior is normal.  Nursing note and vitals reviewed.     Assessment & Plan:   Problem List Items Addressed This Visit    None    Visit Diagnoses    Right arm pain    -  Primary   Relevant Orders   Ambulatory referral to Vascular Surgery   VAS [place] UPPER EXTREMITY VENOUS DUPLEX    Discussed options with patient and wife, including close monitoring, going to the ER for immediate imaging and eval, or working on scheduling an UE u/s urgently. After discussion, pt is opting to get an urgent u/s with vascular. Discussed return precautions, continue lovenox injections.   Follow up plan: Return if symptoms worsen or fail  to improve.

## 2016-09-01 NOTE — Telephone Encounter (Signed)
Ordere tspine MRI, need to switch queue

## 2016-09-02 ENCOUNTER — Telehealth (INDEPENDENT_AMBULATORY_CARE_PROVIDER_SITE_OTHER): Payer: Self-pay | Admitting: *Deleted

## 2016-09-02 NOTE — Telephone Encounter (Signed)
Pt is scheduled at [facility] for MRI on Tues April 10 at 3p, pt is to arrive 2:30pm to register. LMTRC to pt for appt informaiton.

## 2016-09-06 ENCOUNTER — Telehealth: Payer: Self-pay | Admitting: Family Medicine

## 2016-09-06 NOTE — Telephone Encounter (Signed)
Kelton Pillar from [facility] needs to speak with Keri regarding a referral on patient.  [phone]

## 2016-09-07 ENCOUNTER — Other Ambulatory Visit: Payer: Self-pay | Admitting: Hematology and Oncology

## 2016-09-07 ENCOUNTER — Telehealth: Payer: Self-pay | Admitting: *Deleted

## 2016-09-07 DIAGNOSIS — I82B12 Acute embolism and thrombosis of left subclavian vein: Secondary | ICD-10-CM

## 2016-09-07 DIAGNOSIS — M898X1 Other specified disorders of bone, shoulder: Secondary | ICD-10-CM

## 2016-09-07 DIAGNOSIS — R918 Other nonspecific abnormal finding of lung field: Secondary | ICD-10-CM

## 2016-09-07 DIAGNOSIS — R760 Raised antibody titer: Secondary | ICD-10-CM

## 2016-09-07 MED ORDER — ENOXAPARIN SODIUM 150 MG/ML ~~LOC~~ SOLN: 1.0000 mg/kg | Freq: Two times a day (BID) | SUBCUTANEOUS | 0 refills | Status: DC

## 2016-09-07 NOTE — Telephone Encounter (Signed)
LVM for [place]. In referral she wanted to know if U/S was still needed. It was apart of the referral, along with the order. So U/S is still needed. Told her to call me with any further questions.

## 2016-09-07 NOTE — Telephone Encounter (Signed)
Lovenox refilled per Dr. Janese Banks (on-call for [facility]).

## 2016-09-08 ENCOUNTER — Encounter (INDEPENDENT_AMBULATORY_CARE_PROVIDER_SITE_OTHER): Payer: Self-pay | Admitting: *Deleted

## 2016-09-08 NOTE — Telephone Encounter (Signed)
Called pt several times to inform of appt no response back, sent letter with appt information

## 2016-09-09 ENCOUNTER — Ambulatory Visit: Payer: Self-pay | Admitting: Hematology and Oncology

## 2016-09-14 ENCOUNTER — Ambulatory Visit
Admission: RE | Admit: 2016-09-14 | Discharge: 2016-09-14 | Disposition: A | Discharge status: Home / Self Care | Payer: 59 | Source: Ambulatory Visit | Attending: Physical Medicine and Rehabilitation | Admitting: Physical Medicine and Rehabilitation

## 2016-09-14 DIAGNOSIS — M4684 Other specified inflammatory spondylopathies, thoracic region: Secondary | ICD-10-CM | POA: Insufficient documentation

## 2016-09-14 DIAGNOSIS — M2578 Osteophyte, vertebrae: Secondary | ICD-10-CM | POA: Insufficient documentation

## 2016-09-14 DIAGNOSIS — M5126 Other intervertebral disc displacement, lumbar region: Secondary | ICD-10-CM | POA: Diagnosis not present

## 2016-09-14 DIAGNOSIS — M546 Pain in thoracic spine: Secondary | ICD-10-CM | POA: Diagnosis present

## 2016-09-14 DIAGNOSIS — M5136 Other intervertebral disc degeneration, lumbar region: Secondary | ICD-10-CM | POA: Diagnosis not present

## 2016-09-14 DIAGNOSIS — M5134 Other intervertebral disc degeneration, thoracic region: Secondary | ICD-10-CM | POA: Insufficient documentation

## 2016-09-14 DIAGNOSIS — M5414 Radiculopathy, thoracic region: Secondary | ICD-10-CM

## 2016-09-16 ENCOUNTER — Telehealth: Payer: Self-pay | Admitting: Family Medicine

## 2016-09-16 NOTE — Telephone Encounter (Signed)
Patient called to see if we had received the disability forms.  Per patient they were going to fax these over 09-15-2016  [phone]  Justin [name]

## 2016-09-17 ENCOUNTER — Inpatient Hospital Stay: Payer: 59 | Attending: Hematology and Oncology | Admitting: Hematology and Oncology

## 2016-09-17 ENCOUNTER — Encounter: Payer: Self-pay | Admitting: Hematology and Oncology

## 2016-09-17 VITALS — BP 135/95 | HR 60 | Temp 96.6°F | Wt 256.0 lb

## 2016-09-17 DIAGNOSIS — M2578 Osteophyte, vertebrae: Secondary | ICD-10-CM | POA: Diagnosis not present

## 2016-09-17 DIAGNOSIS — M479 Spondylosis, unspecified: Secondary | ICD-10-CM | POA: Diagnosis not present

## 2016-09-17 DIAGNOSIS — Z79899 Other long term (current) drug therapy: Secondary | ICD-10-CM | POA: Insufficient documentation

## 2016-09-17 DIAGNOSIS — Z86718 Personal history of other venous thrombosis and embolism: Secondary | ICD-10-CM

## 2016-09-17 DIAGNOSIS — Z87891 Personal history of nicotine dependence: Secondary | ICD-10-CM | POA: Diagnosis not present

## 2016-09-17 DIAGNOSIS — I82511 Chronic embolism and thrombosis of right femoral vein: Secondary | ICD-10-CM

## 2016-09-17 DIAGNOSIS — K219 Gastro-esophageal reflux disease without esophagitis: Secondary | ICD-10-CM | POA: Insufficient documentation

## 2016-09-17 DIAGNOSIS — R918 Other nonspecific abnormal finding of lung field: Secondary | ICD-10-CM | POA: Diagnosis not present

## 2016-09-17 DIAGNOSIS — I82B12 Acute embolism and thrombosis of left subclavian vein: Secondary | ICD-10-CM

## 2016-09-17 NOTE — Telephone Encounter (Signed)
It has been faxed.  

## 2016-09-17 NOTE — Telephone Encounter (Signed)
Nothing has came across for me. Has anything came across for Dr. Lenna Sciara?

## 2016-09-17 NOTE — Telephone Encounter (Signed)
Completed paperwork to best of my ability and given to Keri to be sent

## 2016-09-17 NOTE — Progress Notes (Signed)
[facility]-  Cancer Center  Clinic day:  09/17/2016  Chief Complaint: Justin [name] is a 50 y.o. male with recurrent thrombosis who is seen for 2 month assessment on Lovenox.    HPI:  The patient was last seen in the hematology clinic by me on 07/08/2016.  At that time, he noted decreased pain in his left side and arm.  His left arm ached.  He was still a little short of breath.  He denied any fevers, sweats or weight loss.  Left scapula was tender.  Bone scan on 08/04/2016 revealed a focus of moderately increased uptake at approximately the T9 right paravertebral region. Chest CT scan on 07/20/2016 revealed a large right sided osteophyte at the T9-T10 level which was felt to explain the increased right paravertebral uptake uptake.  There  was also mildly increased uptake in the body of L2. Review of the lumbar spine on the abdominal CT scan of the same date revealed no definite radiographic abnormality in the body of L2 to explain the mildly increased uptake.  There was no abnormal uptake within the ribs or elsewhere within the skeleton.  He saw Dr. Finnegan in my absence on 08/05/2016.  At that time, he denied any complaint.  Thoracic spine MRI on 09/14/2016 revealed ordinary mild degenerative disc disease in the thoracic spine with non-compressive disc bulges at T5-6 and below.  There was no disc herniation or compressive stenosis of the canal or foramina.  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 On the right at T9-10, there was fairly bulky osteophytes emanating from the right costovertebral articulation. There was no evidence of neural compression secondary to this or the other bony degenerative changes.  There was ordinary mild facet osteoarthritis without edematous change or significant hypertrophy.  Symptomatically, he continues to have pain in his ribs. He has seen orthopedics. He has been  given injection for bone spurs. He notes no relief. Surgery is being considered.   Past Medical History:  Diagnosis Date  . Arm vein blood clot    right  . DVT (deep venous thrombosis) (HCC) 2012; January 2016   History of right arm; now right lower shotty  . GERD (gastroesophageal reflux disease)   . Rib pain on right side 08/04/2016   RT rib pain that radiates around to the back. Level 8 of 10.  Constant. Started 1 mo ago, 1 week after DVT.  . Vascular thoracic outlet syndrome    Right     Past Surgical History:  Procedure Laterality Date  . CAROTID ANGIOGRAM    . KNEE CARTILAGE SURGERY     Performed for cracked patella and torn meniscus  . Right upper extremity thrombectomy  2012  . SCALENOTOMY W/O RESECTION CERVICAL RIB  2012   4 right-sided thoracic outlet syndrome    Family History  Problem Relation Age of Onset  . Heart disease Father   . Breast cancer Father   . Diabetes Father   . Heart attack Father   . Cancer Maternal Grandmother   . Leukemia Maternal Grandfather     Social History:  reports that he has never smoked. He has never used smokeless tobacco. He reports that he does not drink alcohol or use drugs.  He previously smoked 1/2 pack a day for 10 years.  He stopped smoking in his 30s.  He lives in [place].  He is a machine operator.  He denies   any exposure to radiation or toxins.  He has a son.  The patient is accompanied by his wife, Justin [name], today.  Allergies: No Known Allergies  Current Medications: Current Outpatient Prescriptions  Medication Sig Dispense Refill  . clobetasol ointment (TEMOVATE) 0.63 % Apply 1 application topically 2 (two) times daily. 30 g 0  . enoxaparin (LOVENOX) 150 MG/ML injection Inject 0.74 mLs (110 mg total) into the skin 2 (two) times daily. 60 Syringe 0  . oxyCODONE-acetaminophen (ROXICET) 5-325 MG tablet Take 1 tablet by mouth every 8 (eight) hours as needed for severe pain. 30 tablet 0  . topiramate (TOPAMAX) 50 MG tablet Take  50 mg by mouth daily.     Current Facility-Administered Medications  Medication Dose Route Frequency Provider Last Rate Last Dose  . lidocaine (PF) (XYLOCAINE) 1 % injection 0.3 mL  0.3 mL Other Once Magnus Sinning, MD      . methylPREDNISolone acetate (DEPO-MEDROL) injection 80 mg  80 mg Other Once Magnus Sinning, MD        Review of Systems:  GENERAL:  Feels "the same".  No fevers or sweats.  Weight up 4 pounds. PERFORMANCE STATUS (ECOG):  1 HEENT:  Visual changes since last year.  No runny nose, sore throat, mouth sores or tenderness. Lungs: Shortness of breath at times  No cough.  No hemoptysis. Cardiac:  No chest pain, palpitations, orthopnea, or PND. GI:  No nausea, vomiting, diarrhea, constipation, melena or hematochezia. GU:  No urgency, frequency, dysuria, or hematuria. Musculoskeletal:  Right sided rib pain.  No muscle tenderness. Extremities:  No pain or swelling. Skin:  Knots on belly where he does Lovenox injections.  Neuro:  No headache, numbness or weakness, balance or coordination issues. Endocrine:  No diabetes, thyroid issues, hot flashes or night sweats. Psych:  No mood changes, depression or anxiety. Pain:  Rib pain (9 out of 10). Review of systems:  All other systems reviewed and found to be negative.  Physical Exam: Blood pressure (!) 135/95, pulse 60, temperature (!) 96.6 F (35.9 C), temperature source Tympanic, weight 256 lb (116.1 kg). GENERAL:  Well developed, well nourished, muscular gentleman sitting comfortably in the exam room in no acute distress. MENTAL STATUS:  Alert and oriented to person, place and time. HEAD: Wearing a [employer] cap.  Alopecia.  [name].  Normocephalic, atraumatic, face symmetric, no Cushingoid features. EYES:  Glasses.  Brown eyes.  Pupils equal round and reactive to light and accomodation.  No conjunctivitis or scleral icterus. ENT:  Oropharynx clear without lesion.  Tongue normal. Mucous membranes moist.  RESPIRATORY:  Clear to  auscultation without rales, wheezes or rhonchi. CARDIOVASCULAR:  Regular rate and rhythm without murmur, rub or gallop. ABDOMEN:  Soft, non-tender, with active bowel sounds, and no hepatosplenomegaly.  No masses. SKIN:  No rashes, ulcers or lesions. EXTREMITIES: Mild right upper extremity edema.  No skin discoloration or tenderness.  No palpable cords. LYMPH NODES: No palpable cervical, supraclavicular, axillary or inguinal adenopathy  NEUROLOGICAL: Unremarkable. PSYCH:  Appropriate.   No visits with results within 3 Day(s) from this visit.  Latest known visit with results is:  Admission on 08/18/2016, Discharged on 08/18/2016  Component Date Value Ref Range Status  . Fibrin derivatives D-dimer ([facility]) 08/18/2016 71.06  0.00 - 499.00 Final   Comment: (NOTE) <> Exclusion of Venous Thromboembolism (VTE) - OUTPATIENT ONLY   (Emergency Department or [place])   0-499 ng/ml (FEU): With a low to intermediate pretest probability  for VTE this test result excludes the diagnosis                      of VTE.   >499 ng/ml (FEU) : VTE not excluded; additional work up for VTE is                      required. <> Testing on Inpatients and Evaluation of Disseminated Intravascular   Coagulation (DIC) Reference Range:   0-499 ng/ml (FEU)   . Sodium 08/18/2016 134* 135 - 145 mmol/L Final  . Potassium 08/18/2016 3.7  3.5 - 5.1 mmol/L Final  . Chloride 08/18/2016 105  101 - 111 mmol/L Final  . CO2 08/18/2016 23  22 - 32 mmol/L Final  . Glucose, Bld 08/18/2016 114* 65 - 99 mg/dL Final  . BUN 08/18/2016 13  6 - 20 mg/dL Final  . Creatinine, Ser 08/18/2016 1.35* 0.61 - 1.24 mg/dL Final  . Calcium 08/18/2016 9.4  8.9 - 10.3 mg/dL Final  . Total Protein 08/18/2016 7.5  6.5 - 8.1 g/dL Final  . Albumin 08/18/2016 3.6  3.5 - 5.0 g/dL Final  . AST 08/18/2016 28  15 - 41 U/L Final  . ALT 08/18/2016 52  17 - 63 U/L Final  . Alkaline Phosphatase 08/18/2016 82  38 - 126 U/L Final  . Total  Bilirubin 08/18/2016 0.4  0.3 - 1.2 mg/dL Final  . GFR calc non Af Amer 08/18/2016 59* >60 mL/min Final  . GFR calc Af Amer 08/18/2016 >60  >60 mL/min Final   Comment: (NOTE) The eGFR has been calculated using the CKD EPI equation. This calculation has not been validated in all clinical situations. eGFR's persistently <60 mL/min signify possible Chronic Kidney Disease.   . Anion gap 08/18/2016 6  5 - 15 Final  . WBC 08/18/2016 13.5* 3.8 - 10.6 K/uL Final  . RBC 08/18/2016 4.98  4.40 - 5.90 MIL/uL Final  . Hemoglobin 08/18/2016 15.3  13.0 - 18.0 g/dL Final  . HCT 08/18/2016 45.9  40.0 - 52.0 % Final  . MCV 08/18/2016 92.0  80.0 - 100.0 fL Final  . MCH 08/18/2016 30.6  26.0 - 34.0 pg Final  . MCHC 08/18/2016 33.3  32.0 - 36.0 g/dL Final  . RDW 08/18/2016 13.6  11.5 - 14.5 % Final  . Platelets 08/18/2016 229  150 - 440 K/uL Final  . Troponin I 08/18/2016 <0.03  <0.03 ng/mL Final    Assessment:  Justin [name] is a 51 y.o. male with recurrent thrombosis x 4.  He developed a right upper extremity clot in 2010. He underwent thrombolysis.  He was on Coumadin x 6 months.  He developed a recurrent clot in the right upper extremity in 2011.  He underwent thoracic outlet decompression.  He was on Coumadin for 6-9 months.  He developed a right lower extremity DVT in 2016.  He was started on Xarelto.  He developed a left upper extremity DVT on 06/07/2016 while on Xarelto.  Duplex revealed a near occlusive DVT in the lateral aspect of the left subclavian vein and nonocclusive thrombus extending into the adjacent axillary vein.  He was started on Lovenox.  Hypercoagulable work-up on 06/29/2016 revealed the following normal studies:  CBC with diff, Factor V Leiden, prothrombin gene mutation, protein C antigen and activity, protein S antigen and activity, ATIII antigen and activity, anticardiolipin antibodies and beta-2 glycoprotein antibodies.  Lupus anticoagulant panel was positive on Xarelto and  Lovenox.  Chest CT angiogram on 07/02/2016 revealed no evidence of pulmonary embolism.  There were small pulmonary nodules, largest 3 mm, of unclear significance.  Bone scan on 08/04/2016 revealed a focus of moderately increased uptake at approximately the T9 right paravertebral region. Chest CT on 07/20/2016 revealed a large right sided osteophyte at the T9-T10 level which was felt to explain the increased right paravertebral uptake uptake.  There  was also mildly increased uptake in the body of L2. Review of the lumbar spine on the abdominal CT scan of the same date revealed no definite radiographic abnormality in the body of L2 to explain the mildly increased uptake.  There was no abnormal uptake within the ribs or elsewhere within the skeleton.  Thoracic spine MRI on 09/14/2016 revealed mild degenerative disc disease in the thoracic spine with non-compressive disc bulges at T5-6 and below.  There was no disc herniation or compressive stenosis of the canal or foramina.  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 On the right at T9-10, there was fairly bulky osteophytes emanating from the right costovertebral articulation. There was no evidence of neural compression secondary to this or the other bony degenerative changes.  There was ordinary mild facet osteoarthritis without edematous change or significant hypertrophy.  Symptomatically, he has symptomatic pain from bulky osteophytes.  He has not had relief with injections.  Surgery is being considered.  Plan: 1.  Review bone scan, chest CT, and thoracic spine MRI.  No evidence of metastatic disease.  Discussed symptomatic osteophytes.  Discuss plan to follow-up with orthopedics. 2.  Continue Lovenox. 3.  RTC in 2-3 months for MD assess and labs (CBC with diff, CMP).   Melissa C Corcoran, MD  09/17/2016, 11:49 AM  

## 2016-09-17 NOTE — Progress Notes (Signed)
Patient states he is having problems sleeping due to his right rib pain.  States he has more pain when taking a deep breath.  States MRI revealed a bone spur on his rib that is pressing against his lung.

## 2016-09-17 NOTE — Telephone Encounter (Signed)
We have it. Given to [place] to fill out.

## 2016-09-17 NOTE — Telephone Encounter (Signed)
I have not seen anything

## 2016-09-21 ENCOUNTER — Ambulatory Visit (INDEPENDENT_AMBULATORY_CARE_PROVIDER_SITE_OTHER): Payer: 59 | Admitting: Physical Medicine and Rehabilitation

## 2016-09-21 ENCOUNTER — Encounter (INDEPENDENT_AMBULATORY_CARE_PROVIDER_SITE_OTHER): Payer: Self-pay | Admitting: Physical Medicine and Rehabilitation

## 2016-09-21 VITALS — BP 115/65 | HR 62

## 2016-09-21 DIAGNOSIS — G8929 Other chronic pain: Secondary | ICD-10-CM | POA: Diagnosis not present

## 2016-09-21 DIAGNOSIS — R0781 Pleurodynia: Secondary | ICD-10-CM

## 2016-09-21 DIAGNOSIS — M47814 Spondylosis without myelopathy or radiculopathy, thoracic region: Secondary | ICD-10-CM | POA: Diagnosis not present

## 2016-09-21 DIAGNOSIS — M546 Pain in thoracic spine: Secondary | ICD-10-CM | POA: Diagnosis not present

## 2016-09-21 DIAGNOSIS — M609 Myositis, unspecified: Secondary | ICD-10-CM | POA: Diagnosis not present

## 2016-09-21 NOTE — Progress Notes (Signed)
[facility] Consultation      Assessment and Plan:  Right sided chest pain. Very strong point tenderness: -Given the strong tenderness at the site it would be doubtful that this would represent a lung or pleural pathology, particularly as this has not responded to prednisone.  --Continue current orthopedic workup at [facility] as planned.   DVT --Continue lovenox.   Atelectasis. -There is some streaky atelectasis seen on the CT imaging, this is likely secondary to splinting. No need for further workup, particularly as he has had 3 CT scans thus far this year. I would be concerned about repeating further CT imaging at this time.  Date: 09/21/2016  MRN# [id number] Justin Justin [name] Justin [name] February 07, 1966    Justin Justin [name] is a 51 y.o. old male seen in consultation for chief complaint of:    Chief Complaint  Patient presents with  . Advice Only    per Dr. Wynetta Emery. CT on 08/18/16. pt reports of R sided rib and back pain & occ sob with excertion.     HPI:  Pt sent over by Dr Wynetta Emery, he has been having pain in his right side of his chest for about the past 2 months. He was at work, it came on spontaneously.  It runs around his side to his lower shoulder blade. He is grabbing at his right side now in obvious pain. He is seeing an orthopedic doctor currently; he has been tried on injections which did not help.  It is worse with deep breaths, esp with coughing. It better with percocet and ibuprofen He has lupus anticoagulant and is on Lovenox twice per day. He has had history of blood clotting with PE as well as subclavian vein thrombosis, multiple UE DVT.  He is taking OTC ibuprofen 2 tabs twice per day. He has tried aleve but did not help.   He is doing injections of lovenox bid, 110 mg bid, he has maybe missed 2 doses over several weeks.   He has been on prednisone and lidocaine patches which not help.   Images personally reviewed; CT chest 08/18/16 and 07/20/16 07/02/16; there is  persistent streaky atelectasis (which was not present on initial scan), L>R.   PMHX:   Past Medical History:  Diagnosis Date  . Arm vein blood clot    right  . DVT (deep venous thrombosis) ([place]) 2012; January 2016   History of right arm; now right lower shotty  . GERD (gastroesophageal reflux disease)   . Rib pain on right side 08/04/2016   RT rib pain that radiates around to the back. Level 8 of 10.  Constant. Started 1 mo ago, 1 week after DVT.  [facility] thoracic outlet syndrome    Right    Surgical Hx:  Past Surgical History:  Procedure Laterality Date  . CAROTID ANGIOGRAM    . KNEE CARTILAGE SURGERY     Performed for cracked patella and torn meniscus  . Right upper extremity thrombectomy  2012  . SCALENOTOMY W/O RESECTION CERVICAL RIB  2012   4 right-sided thoracic outlet syndrome   Family Hx:  Family History  Problem Relation Age of Onset  . Heart disease Father   . Breast cancer Father   . Diabetes Father   . Heart attack Father   . Cancer Maternal Grandmother   . Leukemia Maternal Grandfather    Social Hx:   Social History  Substance Use Topics  . Smoking status: Never Smoker  . Smokeless tobacco: Never Used  .  Alcohol use No   Medication:   Reviewed.    Allergies:  Patient has no known allergies.  Review of Systems: Gen:  Denies  fever, sweats, chills HEENT: Denies blurred vision, double vision. bleeds, sore throat Cvc:  No dizziness, chest pain. Resp:   Denies cough or sputum production, shortness of breath Gi: Denies swallowing difficulty, stomach pain. Gu:  Denies bladder incontinence, burning urine Ext:   No Joint pain, stiffness. Skin: No skin rash,  hives  Endoc:  No polyuria, polydipsia. Psych: No depression, insomnia. Other:  All other systems were reviewed with the patient and were negative other that what is mentioned in the HPI.   Physical Examination:   VS: BP 124/72 (BP Location: Left Arm, Cuff Size: Normal)   Pulse 87   Ht 5'  11" (1.803 m)   Wt 257 lb 9.6 oz (116.8 kg)   SpO2 96%   BMI 35.93 kg/m   General Appearance: No distress  Neuro:without focal findings,  speech normal,  HEENT: PERRLA, EOM intact.   Pulmonary: normal breath sounds, No wheezing.  CardiovascularNormal S1,S2.  No m/r/g.   Abdomen: Benign, Soft, non-tender. Renal:  No costovertebral tenderness  GU:  No performed at this time. Endoc: No evident thyromegaly, no signs of acromegaly. Skin:   warm, no rashes, no ecchymosis  Extremities: normal, no cyanosis, clubbing.  Other findings:    LABORATORY PANEL:   CBC No results for input(s): WBC, HGB, HCT, PLT in the last 168 hours. ------------------------------------------------------------------------------------------------------------------  Chemistries  No results for input(s): NA, K, CL, CO2, GLUCOSE, BUN, CREATININE, CALCIUM, MG, AST, ALT, ALKPHOS, BILITOT in the last 168 hours.  Invalid input(s): GFRCGP ------------------------------------------------------------------------------------------------------------------  Cardiac Enzymes No results for input(s): TROPONINI in the last 168 hours. ------------------------------------------------------------  RADIOLOGY:  No results found.     Thank  Justin for the consultation and for allowing [facility] to assist in the care of your patient. Our recommendations are noted above.  Please contact us if we can be of further service.   Marda Stalker, MD.  Board Certified in Internal Medicine, Pulmonary Medicine, [place], and Sleep Medicine.  [facility] Office Number: [phone]  Patricia Pesa, M.D.  Merton Border, M.D  09/21/2016

## 2016-09-21 NOTE — Progress Notes (Signed)
Justin [name] - 51 y.o. male MRN [id number]  Date of birth: 04-25-1966  Office Visit Note: Visit Date: 09/21/2016 PCP: Park Liter, DO Referred by: Valerie Roys, DO  Subjective: Chief Complaint  Patient presents with  . Middle Back - Pain   HPI: Justin [name] is a 51 year old right-hand-dominant gentleman with continued complaints since early February 2018 of severe right mid to lower rib pain lateral and posterior and mid axillary. He does not note any specific trauma. He states he just woke up in the pain began to worsen. He reports that the pain is probably a 7 out of 10 and is constant. He does take oxycodone at this point which is about the only thing that relieves any of the pain and this takes the edge off. He does get a paresthesia-type feeling or feeling that he just can't explain this seems to radiate from the thoracic spine around the rib. This is in about the T10 area in terms of where it radiates. He has no left-sided complaints. It is worse with breathing and inhalation. He denies any symptoms in the arms or legs. He has had no previous specific thoracic pain. He has had pain in a similar fashion when he has had blood clots. He has lupus anticoagulant and is on Lovenox twice per day. He has had history of blood clotting with PE as well as subclavian vein thrombosis. He has a remote history of thoracic outlet syndrome. He had a full workup with CT angiogram and CT abdomen and pelvis in February along with ultrasound and nuclear medicine study. The nuclear medicine study dated 08/04/2016 was most interesting because they did find uptake in the paravertebral area on the right at around T9. The radiologist did look at the prior CT scan of the chest and noted that on February 13 there was a large right-sided osteophyte at the T9-10 level which was felt to explain the increased uptake. I saw the patient several weeks ago and completed intercostal nerve block with fluoroscopic guidance  without much relief of his symptoms at all. We did obtain an MRI of the thoracic spine. This is reviewed below. It did reconfirm the osteophyte at T9-10 but without any evidence of nerve compression. The ventral aspect of the osteophyte doesn't relate next to the pleura.  He reports today no real changes in his symptoms. He has not had focused physical therapy of this area. He is very frustrated with the amount of pain that he is having.    Here for MRI review. Symptoms unchanged. Continued right sided pain. Review of Systems  Constitutional: Negative for chills, fever, malaise/fatigue and weight loss.  HENT: Negative for hearing loss and sinus pain.   Eyes: Negative for blurred vision, double vision and photophobia.  Respiratory: Negative for cough and shortness of breath.   Cardiovascular: Negative for chest pain, palpitations and leg swelling.  Gastrointestinal: Negative for abdominal pain, nausea and vomiting.  Genitourinary: Negative for flank pain.  Musculoskeletal: Positive for back pain. Negative for myalgias.  Skin: Negative for itching and rash.  Neurological: Positive for tingling. Negative for tremors, focal weakness and weakness.  Endo/Heme/Allergies: Negative.   Psychiatric/Behavioral: Negative for depression.  All other systems reviewed and are negative.  Otherwise per HPI.  Assessment & Plan: Visit Diagnoses:  1. Chronic right-sided thoracic back pain   2. Thoracic spondylosis without myelopathy   3. Rib pain on right side   4. Myofascitis     Plan: Findings:  Chronic somewhat  worsening and recalcitrant right-sided thoracic pain with pain radiating to the mid axillary line around the T10 rib. He does get worsening with breathing. He has not had specific shortness of breath. Intercostal nerve block with fluoroscopic guidance in our office was ineffectual. MRI scan of the thoracic spine shows fairly normal thoracic spine with mild disc bulging but no focal disc herniation or  focal nerve entrapment. There is a right-sided disc osteophyte at T9-10 at the costovertebral articulation. I feel like his pain is myofascial in some regard. He does have apprehension to trying to palpate along the muscles of the thoracic spine in this area. He does feel almost a paresthesia. I would question some level of pleurisy.  His case is complicated by lupus anticoagulant. He is on chronic Lovenox treatment. He has had multiple DVTs and pulmonary embolisms. He has had recent workup for this. At this point with a long discussion with the patient and his wife we are going to send him to physical therapy for a short course to mechanically look at his musculature in this area and look at maybe dry needling and mechanical treatment. Currently we are going to refer him to [facility] for neurosurgical evaluation of this thoracic spine osteophyte at T9-10. They obviously would make further recommendation if needed to address a thoracic/cardiovascular surgeon if needed. The patient lives in [place] and is closer to [facility] system and would like referral there. Referral placed today. I spent more than 25 minutes speaking face-to-face with the patient with 50% of the time in counseling.    Meds & Orders: No orders of the defined types were placed in this encounter.   Orders Placed This Encounter  Procedures  . Ambulatory referral to Physical Therapy  . Ambulatory referral to Neurosurgery    Follow-up: Return if symptoms worsen or fail to improve.   Procedures: No procedures performed  No notes on file   Clinical History: Thoracic spine MRI 09/14/2016  IMPRESSION: Ordinary mild degenerative disc disease in the thoracic spine with non-compressive disc bulges at T5-6 and below. No disc herniation or compressive stenosis of the canal or foramina.  Costovertebral osteoarthritis in the mid and lower thoracic spine which could be associated with back pain. On the right at T9-10, there are fairly bulky  osteophytes emanating from the right costovertebral articulation. There is no evidence of neural compression secondary to this or the other bony degenerative changes.  Ordinary mild facet osteoarthritis without edematous change or significant hypertrophy.  CT Angio and CT ABD/PELVIS 08/18/2016 IMPRESSION: No evidence of significant pulmonary embolus. Fibrosis or atelectasis in the lung bases with scattered calcified granulomas. No evidence of active pulmonary disease.  No acute process demonstrated in the abdomen or pelvis. No evidence of bowel obstruction or inflammation. Injection sites in the subcutaneous fat of the abdominal wall.  RUQ [place] ABD 08/18/2016 IMPRESSION: Cholelithiasis. No evidence of cholecystitis.   Nuclear medicine bone scan 08/04/2016 IMPRESSION: There is a focus of moderately increased uptake at approximately the T9 right paravertebral region. On the chest CT scan dated July 20, 2016 a large right sided osteophyte was noted at the T9-T10 level which is felt to explain the increased right paravertebral uptake uptake.  There is also mildly increased uptake in the body of L2. Review of the lumbar spine on the abdominal CT scan of the same date reveals no definite radiographic abnormality in the body of L2 to explain the mildly increased uptake.  No abnormal uptake within the ribs or elsewhere  within the skeleton.  He reports that he has never smoked. He has never used smokeless tobacco. No results for input(s): HGBA1C, LABURIC in the last 8760 hours.  Objective:  VS:  HT:    WT:   BMI:     BP:115/65  HR:62bpm  TEMP: ( )  RESP:  Physical Exam  Constitutional: He is oriented to person, place, and time. He appears well-developed and well-nourished. No distress.  HENT:  Head: Normocephalic and atraumatic.  Nose: Nose normal.  Mouth/Throat: Oropharynx is clear and moist.  Eyes: Conjunctivae are normal. Pupils are equal, round, and reactive to  light.  Neck: Normal range of motion. Neck supple.  Cardiovascular: Regular rhythm and intact distal pulses.   Pulmonary/Chest: Effort normal and breath sounds normal.  Abdominal: Soft. He exhibits no distension and no mass. There is tenderness. There is no rebound and no guarding.  Musculoskeletal: He exhibits no deformity.  Patient ambulates without aid she does ambulate with a forward flexed spine and somewhat left sided deviation with walking. He is tender to palpation over the para spinal musculature around the midthoracic region. He is almost apprehensive for me trying to touch this area. He does have pain to some degree along the palpation of the ribs of the lower thoracic area. He has more apprehension than actual pain with palpation if he started out slow. He has good upper body extremity strength bilaterally. He has good distal lower extremity strength bilaterally. No clonus bilaterally.  Neurological: He is alert and oriented to person, place, and time.  Skin: Skin is warm. No rash noted.  Psychiatric: He has a normal mood and affect. His behavior is normal.  Nursing note and vitals reviewed.   Ortho Exam Imaging: No results found.  Past Medical/Family/Surgical/Social History: Medications & Allergies reviewed per EMR Patient Active Problem List   Diagnosis Date Noted  . Lupus anticoagulant positive 07/08/2016  . DVT (deep venous thrombosis) ([place]) 07/08/2016  . Thoracic outlet syndrome 07/08/2016  . Pain of left scapula 07/05/2016  . Dyspnea 07/05/2016  . Left subclavian vein thrombosis ([place]) 06/29/2016  . Abnormal finding on EKG 10/04/2014  . Atypical chest pain 10/04/2014   Past Medical History:  Diagnosis Date  . Arm vein blood clot    right  . DVT (deep venous thrombosis) ([place]) 2012; January 2016   History of right arm; now right lower shotty  . GERD (gastroesophageal reflux disease)   . Rib pain on right side 08/04/2016   RT rib pain that radiates around to the  back. Level 8 of 10.  Constant. Started 1 mo ago, 1 week after DVT.  Marland Kitchen Vascular thoracic outlet syndrome    Right    Family History  Problem Relation Age of Onset  . Heart disease Father   . Breast cancer Father   . Diabetes Father   . Heart attack Father   . Cancer Maternal Grandmother   . Leukemia Maternal Grandfather    Past Surgical History:  Procedure Laterality Date  . CAROTID ANGIOGRAM    . KNEE CARTILAGE SURGERY     Performed for cracked patella and torn meniscus  . Right upper extremity thrombectomy  2012  . SCALENOTOMY W/O RESECTION CERVICAL RIB  2012   4 right-sided thoracic outlet syndrome   Social History   Occupational History  .      [employer]   Social History Main Topics  . Smoking status: Never Smoker  . Smokeless tobacco: Never Used  . Alcohol use No  .  Drug use: No  . Sexual activity: Not on file

## 2016-09-23 ENCOUNTER — Ambulatory Visit (INDEPENDENT_AMBULATORY_CARE_PROVIDER_SITE_OTHER): Payer: 59 | Admitting: Internal Medicine

## 2016-09-23 ENCOUNTER — Encounter: Payer: Self-pay | Admitting: Internal Medicine

## 2016-09-23 VITALS — BP 124/72 | HR 87 | Ht 71.0 in | Wt 257.6 lb

## 2016-09-23 DIAGNOSIS — J9811 Atelectasis: Secondary | ICD-10-CM | POA: Diagnosis not present

## 2016-09-23 DIAGNOSIS — I82409 Acute embolism and thrombosis of unspecified deep veins of unspecified lower extremity: Secondary | ICD-10-CM

## 2016-09-23 DIAGNOSIS — O2230 Deep phlebothrombosis in pregnancy, unspecified trimester: Secondary | ICD-10-CM

## 2016-09-23 NOTE — Patient Instructions (Signed)
Continue lovenox

## 2016-10-12 DIAGNOSIS — M5414 Radiculopathy, thoracic region: Secondary | ICD-10-CM | POA: Diagnosis not present

## 2016-10-13 ENCOUNTER — Other Ambulatory Visit: Payer: Self-pay | Admitting: Oncology

## 2016-10-13 DIAGNOSIS — R918 Other nonspecific abnormal finding of lung field: Secondary | ICD-10-CM

## 2016-10-13 DIAGNOSIS — M898X1 Other specified disorders of bone, shoulder: Secondary | ICD-10-CM

## 2016-10-13 DIAGNOSIS — I82B12 Acute embolism and thrombosis of left subclavian vein: Secondary | ICD-10-CM

## 2016-10-13 DIAGNOSIS — R760 Raised antibody titer: Secondary | ICD-10-CM

## 2016-10-19 ENCOUNTER — Telehealth: Payer: Self-pay | Admitting: Family Medicine

## 2016-10-19 NOTE — Telephone Encounter (Signed)
Patient called in regards to the insurance forms he had dropped off. Patient wanted to know if the paperwork was signed and if he could pick it up.   Please Advise.  Thank you

## 2016-10-21 NOTE — Telephone Encounter (Signed)
Form filled out. Spoke with patient.  Patient scheduled appointment for 10/25/2016. Stated that he'd pick up paper at his appointment if he didn't come get it before then. (Monday.)  Paper is with Laquandra Carrillo.

## 2016-10-25 ENCOUNTER — Ambulatory Visit (INDEPENDENT_AMBULATORY_CARE_PROVIDER_SITE_OTHER): Payer: 59 | Admitting: Family Medicine

## 2016-10-25 ENCOUNTER — Encounter: Payer: Self-pay | Admitting: Family Medicine

## 2016-10-25 VITALS — BP 121/80 | HR 70 | Temp 97.9°F | Wt 262.0 lb

## 2016-10-25 DIAGNOSIS — G588 Other specified mononeuropathies: Secondary | ICD-10-CM

## 2016-10-25 NOTE — Progress Notes (Signed)
   BP 121/80   Pulse 70   Temp 97.9 F (36.6 C)   Wt 262 lb (118.8 kg)   SpO2 99%   BMI 36.54 kg/m    Subjective:    Patient ID: Justin [name], male    DOB: December 20, 1965, 51 y.o.   MRN: [id number]  HPI: DIONTE BLAUSTEIN is a 51 y.o. male  Chief Complaint  Patient presents with  . Follow-up    needs note extended to be out of work. Still having alot of pain.    Patient presents to discuss extending his absence from work. Being evaluated by [facility] Neurosurgery currently for surgical excision of a large osteophyte on inner right rib. Still in a severe amount of pain s/p multiple attempts at nerve block injections. Has second consultation set for June 5th with Neurosurgery and set to repeat CT for further planning purposes. No new sxs, no weight loss, fever, chills, SOB, leg weakness or numbness.   Relevant past medical, surgical, family and social history reviewed and updated as indicated. Interim medical history since our last visit reviewed. Allergies and medications reviewed and updated.  Review of Systems  Constitutional: Negative.   HENT: Negative.   Eyes: Negative.   Respiratory: Negative.   Cardiovascular: Negative.   Gastrointestinal: Negative.   Genitourinary: Negative.   Musculoskeletal: Positive for arthralgias (right ribs ).  Neurological: Negative.   Psychiatric/Behavioral: Negative.    Per HPI unless specifically indicated above     Objective:    BP 121/80   Pulse 70   Temp 97.9 F (36.6 C)   Wt 262 lb (118.8 kg)   SpO2 99%   BMI 36.54 kg/m   Wt Readings from Last 3 Encounters:  10/25/16 262 lb (118.8 kg)  09/23/16 257 lb 9.6 oz (116.8 kg)  09/17/16 256 lb (116.1 kg)    Physical Exam  Constitutional: He is oriented to person, place, and time. He appears well-developed and well-nourished.  HENT:  Head: Atraumatic.  Eyes: Conjunctivae are normal. Pupils are equal, round, and reactive to light.  Neck: Normal range of motion. Neck supple.    Cardiovascular: Normal rate and normal heart sounds.   Pulmonary/Chest: Effort normal and breath sounds normal. No respiratory distress.  Musculoskeletal: He exhibits tenderness (Severe ttp over right ribs).  Pt obviously in significant pain. Moves slowly and cautiously in seat  Neurological: He is alert and oriented to person, place, and time.  Skin: Skin is warm and dry.  Psychiatric: He has a normal mood and affect. His behavior is normal.  Nursing note and vitals reviewed.     Assessment & Plan:   Problem List Items Addressed This Visit    None    Visit Diagnoses    Intercostal neuritis    -  Primary   F/u as scheduled with Neurosurgery to discuss surgical planning. Forms completed for work absence.        Follow up plan: Return for as scheduled with neurosurgery.

## 2016-10-25 NOTE — Patient Instructions (Signed)
Follow up as scheduled with Neurosurgery

## 2016-10-26 ENCOUNTER — Telehealth: Payer: Self-pay | Admitting: Family Medicine

## 2016-10-26 NOTE — Telephone Encounter (Signed)
Pt called and stated that he may need another note for work. He stated that while he has an appt 06/05 it is with the radiologist and not with the actual surgeon. He stated that he wouldn't see the actual surgeon for another 2-3 weeks after and would like to know if he should go to that appt first or if he could get a note now to carry him through the 2-3 week period.

## 2016-10-27 NOTE — Telephone Encounter (Signed)
Work note written. Patient notified.

## 2016-10-27 NOTE — Telephone Encounter (Signed)
Routing to provider  

## 2016-10-27 NOTE — Telephone Encounter (Signed)
Ok to generate extended note

## 2016-10-29 ENCOUNTER — Telehealth: Payer: Self-pay | Admitting: Family Medicine

## 2016-10-29 NOTE — Telephone Encounter (Signed)
Patient called to let Apolonio Schneiders be aware that she would be receiving FMLA paperwork that needed to be updated on patient stating his leave was extended until 6-22 .Marland Kitchen  If any questions please call [place]

## 2016-11-03 NOTE — Telephone Encounter (Signed)
Paperwork faxed to [employer].

## 2016-11-03 NOTE — Telephone Encounter (Signed)
Originals in drawer for patient to pick up.

## 2016-11-03 NOTE — Telephone Encounter (Signed)
Paperwork completed, left message to call.

## 2016-11-08 ENCOUNTER — Other Ambulatory Visit: Payer: Self-pay | Admitting: *Deleted

## 2016-11-08 DIAGNOSIS — I82B12 Acute embolism and thrombosis of left subclavian vein: Secondary | ICD-10-CM

## 2016-11-08 DIAGNOSIS — R760 Raised antibody titer: Secondary | ICD-10-CM

## 2016-11-08 DIAGNOSIS — M898X1 Other specified disorders of bone, shoulder: Secondary | ICD-10-CM

## 2016-11-08 DIAGNOSIS — R918 Other nonspecific abnormal finding of lung field: Secondary | ICD-10-CM

## 2016-11-08 MED ORDER — ENOXAPARIN SODIUM 150 MG/ML ~~LOC~~ SOLN: 1.0000 mg/kg | Freq: Two times a day (BID) | SUBCUTANEOUS | 0 refills | Status: DC

## 2016-11-09 DIAGNOSIS — M5414 Radiculopathy, thoracic region: Secondary | ICD-10-CM | POA: Diagnosis not present

## 2016-11-16 ENCOUNTER — Telehealth: Payer: Self-pay | Admitting: Family Medicine

## 2016-11-16 NOTE — Telephone Encounter (Signed)
error 

## 2016-11-16 NOTE — Telephone Encounter (Signed)
Patient asking if he could get his note extended for work until July 15th due to him possibly having to get surgery. Patient has an appointment for July 10th to find out if he needs to get surgery due to his injections not working.   Please Advise.   Thank you

## 2016-11-16 NOTE — Telephone Encounter (Deleted)
Patient asking if he could get his note extended for work until July 15th due to him possibly having to get surgery. Patient has an appointment for July 10th to find out if he needs to get surgery due to his injections not working.   Please Advise.   Thank you

## 2016-11-17 NOTE — Telephone Encounter (Signed)
Ok to extend

## 2016-11-17 NOTE — Telephone Encounter (Signed)
Work note written. Patient notified.

## 2016-11-17 NOTE — Telephone Encounter (Signed)
Routing to provider  

## 2016-11-19 ENCOUNTER — Telehealth: Payer: Self-pay | Admitting: Family Medicine

## 2016-11-19 NOTE — Telephone Encounter (Signed)
Noted  

## 2016-11-19 NOTE — Telephone Encounter (Signed)
Patient called to make sure Justin [name] would be on the lookout for a form regarding his disability.

## 2016-11-22 NOTE — Telephone Encounter (Signed)
Paperwork faxed to [employer].

## 2016-11-26 ENCOUNTER — Inpatient Hospital Stay: Payer: 59 | Admitting: Hematology and Oncology

## 2016-11-26 ENCOUNTER — Inpatient Hospital Stay: Payer: 59

## 2016-12-05 ENCOUNTER — Other Ambulatory Visit: Payer: Self-pay | Admitting: Hematology and Oncology

## 2016-12-05 DIAGNOSIS — M898X1 Other specified disorders of bone, shoulder: Secondary | ICD-10-CM

## 2016-12-05 DIAGNOSIS — R760 Raised antibody titer: Secondary | ICD-10-CM

## 2016-12-05 DIAGNOSIS — R918 Other nonspecific abnormal finding of lung field: Secondary | ICD-10-CM

## 2016-12-05 DIAGNOSIS — I82B12 Acute embolism and thrombosis of left subclavian vein: Secondary | ICD-10-CM

## 2016-12-14 ENCOUNTER — Encounter: Payer: Self-pay | Admitting: Unknown Physician Specialty

## 2016-12-14 ENCOUNTER — Ambulatory Visit (INDEPENDENT_AMBULATORY_CARE_PROVIDER_SITE_OTHER): Payer: 59 | Admitting: Unknown Physician Specialty

## 2016-12-14 ENCOUNTER — Telehealth: Payer: Self-pay | Admitting: Family Medicine

## 2016-12-14 DIAGNOSIS — M5414 Radiculopathy, thoracic region: Secondary | ICD-10-CM | POA: Diagnosis not present

## 2016-12-14 DIAGNOSIS — M26621 Arthralgia of right temporomandibular joint: Secondary | ICD-10-CM | POA: Diagnosis not present

## 2016-12-14 NOTE — Assessment & Plan Note (Signed)
Heat. NSAIDs.  Consider dental referral

## 2016-12-14 NOTE — Telephone Encounter (Signed)
Patient provided a copy of his extended doctors note from his other doctors office where he is receiving his surgery. Patient states disability services is needing a letter stating patients provider agrees with the letter from his other medial office. Letter stating patient to have surgery 01/13/2017 and will not be able to return to work until eight weeks after.  Letter placed in provider's mailbox.  Please Advise.  Thank you

## 2016-12-14 NOTE — Progress Notes (Signed)
BP 120/82   Pulse 67   Temp 98.1 F (36.7 C)   Wt 264 lb 6.4 oz (119.9 kg)   SpO2 97%   BMI 36.88 kg/m    Subjective:    Patient ID: Justin [name], male    DOB: Sep 10, 1965, 51 y.o.   MRN: [id number]  HPI: Justin [name] is a 51 y.o. male  Chief Complaint  Patient presents with  . URI    pt states that he started having a sinus headache about 2 weeks ago, states that he feels pressure on the right side of his face and right ear pain. He also states he has tried flonase but it did not help   HPI  Pt is here for right sided facial pain and right ear pain.  No nasal congestion congestion.  States it is not an intense pain but a pressure.    Relevant past medical, surgical, family and social history reviewed and updated as indicated. Interim medical history since our last visit reviewed. Allergies and medications reviewed and updated.  Review of Systems  Per HPI unless specifically indicated above     Objective:    BP 120/82   Pulse 67   Temp 98.1 F (36.7 C)   Wt 264 lb 6.4 oz (119.9 kg)   SpO2 97%   BMI 36.88 kg/m   Wt Readings from Last 3 Encounters:  12/14/16 264 lb 6.4 oz (119.9 kg)  10/25/16 262 lb (118.8 kg)  09/23/16 257 lb 9.6 oz (116.8 kg)    Physical Exam  Constitutional: He is oriented to person, place, and time. He appears well-developed and well-nourished. No distress.  HENT:  Head: Normocephalic and atraumatic.  Right Ear: Hearing and tympanic membrane normal.  Left Ear: Hearing and tympanic membrane normal.  Nose: Nose normal.  Right ear irrigated revealing normal TM  Eyes: Conjunctivae and lids are normal. Right eye exhibits no discharge. Left eye exhibits no discharge. No scleral icterus.  Neck: Normal range of motion. Neck supple. No JVD present. Carotid bruit is not present.  Cardiovascular: Normal rate, regular rhythm and normal heart sounds.   Pulmonary/Chest: Effort normal and breath sounds normal. No respiratory distress.    Abdominal: Normal appearance. There is no splenomegaly or hepatomegaly.  Musculoskeletal: Normal range of motion.  Neurological: He is alert and oriented to person, place, and time.  Skin: Skin is warm, dry and intact. No rash noted. No pallor.  Psychiatric: He has a normal mood and affect. His behavior is normal. Judgment and thought content normal.    Results for orders placed or performed during the hospital encounter of 08/18/16  Fibrin derivatives D[facility] only)  Result Value Ref Range   Fibrin derivatives D-dimer ([facility]) 71.06 0.00 - 499.00  Comprehensive metabolic panel  Result Value Ref Range   Sodium 134 (L) 135 - 145 mmol/L   Potassium 3.7 3.5 - 5.1 mmol/L   Chloride 105 101 - 111 mmol/L   CO2 23 22 - 32 mmol/L   Glucose, Bld 114 (H) 65 - 99 mg/dL   BUN 13 6 - 20 mg/dL   Creatinine, Ser 1.35 (H) 0.61 - 1.24 mg/dL   Calcium 9.4 8.9 - 10.3 mg/dL   Total Protein 7.5 6.5 - 8.1 g/dL   Albumin 3.6 3.5 - 5.0 g/dL   AST 28 15 - 41 U/L   ALT 52 17 - 63 U/L   Alkaline Phosphatase 82 38 - 126 U/L   Total Bilirubin 0.4 0.3 -  1.2 mg/dL   GFR calc non Af Amer 59 (L) >60 mL/min   GFR calc Af Amer >60 >60 mL/min   Anion gap 6 5 - 15  CBC  Result Value Ref Range   WBC 13.5 (H) 3.8 - 10.6 K/uL   RBC 4.98 4.40 - 5.90 MIL/uL   Hemoglobin 15.3 13.0 - 18.0 g/dL   HCT 45.9 40.0 - 52.0 %   MCV 92.0 80.0 - 100.0 fL   MCH 30.6 26.0 - 34.0 pg   MCHC 33.3 32.0 - 36.0 g/dL   RDW 13.6 11.5 - 14.5 %   Platelets 229 150 - 440 K/uL  Troponin I  Result Value Ref Range   Troponin I <0.03 <0.03 ng/mL      Assessment & Plan:   Problem List Items Addressed This Visit      Unprioritized   TMJ tenderness, right    Heat. NSAIDs.  Consider dental referral          Follow up plan: Return if symptoms worsen or fail to improve.

## 2016-12-14 NOTE — Patient Instructions (Addendum)
Temporomandibular Joint Syndrome Temporomandibular joint (TMJ) syndrome is a condition that affects the joints between your jaw and your skull. The TMJs are located near your ears and allow your jaw to open and close. These joints and the nearby muscles are involved in all movements of the jaw. People with TMJ syndrome have pain in the area of these joints and muscles. Chewing, biting, or other movements of the jaw can be difficult or painful. TMJ syndrome can be caused by various things. In many cases, the condition is mild and goes away within a few weeks. For some people, the condition can become a long-term problem. What are the causes? Possible causes of TMJ syndrome include:  Grinding your teeth or clenching your jaw. Some people do this when they are under stress.  Arthritis.  Injury to the jaw.  Head or neck injury.  Teeth or dentures that are not aligned well.  In some cases, the cause of TMJ syndrome may not be known. What are the signs or symptoms? The most common symptom is an aching pain on the side of the head in the area of the TMJ. Other symptoms may include:  Pain when moving your jaw, such as when chewing or biting.  Being unable to open your jaw all the way.  Making a clicking sound when you open your mouth.  Headache.  Earache.  Neck or shoulder pain.  How is this diagnosed? Diagnosis can usually be made based on your symptoms, your medical history, and a physical exam. Your health care provider may check the range of motion of your jaw. Imaging tests, such as X-rays or an MRI, are sometimes done. You may need to see your dentist to determine if your teeth and jaw are lined up correctly. How is this treated? TMJ syndrome often goes away on its own. If treatment is needed, the options may include:  Eating soft foods and applying ice or heat.  Medicines to relieve pain or inflammation.  Medicines to relax the muscles.  A splint, bite plate, or mouthpiece  to prevent teeth grinding or jaw clenching.  Relaxation techniques or counseling to help reduce stress.  Transcutaneous electrical nerve stimulation (TENS). This helps to relieve pain by applying an electrical current through the skin.  Acupuncture. This is sometimes helpful to relieve pain.  Jaw surgery. This is rarely needed.  Follow these instructions at home:  Take medicines only as directed by your health care provider.  Eat a soft diet if you are having trouble chewing.  Apply ice to the painful area. ? Put ice in a plastic bag. ? Place a towel between your skin and the bag. ? Leave the ice on for 20 minutes, 2-3 times a day.  Apply a warm compress to the painful area as directed.  Massage your jaw area and perform any jaw stretching exercises as recommended by your health care provider.  If you were given a mouthpiece or bite plate, wear it as directed.  Avoid foods that require a lot of chewing. Do not chew gum.  Keep all follow-up visits as directed by your health care provider. This is important. Contact a health care provider if:  You are having trouble eating.  You have new or worsening symptoms. Get help right away if:  Your jaw locks open or closed. This information is not intended to replace advice given to you by your health care provider. Make sure you discuss any questions you have with your health care provider. Document   Released: 02/16/2001 Document Revised: 01/22/2016 Document Reviewed: 12/27/2013 Elsevier Interactive Patient Education  2018 [employer] Inc.  

## 2016-12-15 NOTE — Telephone Encounter (Signed)
Ok to generate letter of support for other provider's note

## 2016-12-15 NOTE — Telephone Encounter (Signed)
Letter written. Left message that it is ready to be picked up.

## 2016-12-15 NOTE — Telephone Encounter (Signed)
Routing to provider  

## 2016-12-24 DIAGNOSIS — M5414 Radiculopathy, thoracic region: Secondary | ICD-10-CM | POA: Diagnosis not present

## 2016-12-24 DIAGNOSIS — M5442 Lumbago with sciatica, left side: Secondary | ICD-10-CM | POA: Diagnosis not present

## 2016-12-24 DIAGNOSIS — G8929 Other chronic pain: Secondary | ICD-10-CM | POA: Diagnosis not present

## 2016-12-26 DIAGNOSIS — Z86718 Personal history of other venous thrombosis and embolism: Secondary | ICD-10-CM | POA: Insufficient documentation

## 2016-12-26 DIAGNOSIS — M898X1 Other specified disorders of bone, shoulder: Secondary | ICD-10-CM | POA: Insufficient documentation

## 2016-12-26 DIAGNOSIS — Z87891 Personal history of nicotine dependence: Secondary | ICD-10-CM | POA: Insufficient documentation

## 2016-12-26 DIAGNOSIS — R0789 Other chest pain: Secondary | ICD-10-CM | POA: Insufficient documentation

## 2016-12-26 DIAGNOSIS — M7552 Bursitis of left shoulder: Secondary | ICD-10-CM | POA: Diagnosis not present

## 2016-12-26 DIAGNOSIS — Z79899 Other long term (current) drug therapy: Secondary | ICD-10-CM | POA: Diagnosis not present

## 2016-12-26 DIAGNOSIS — G540 Brachial plexus disorders: Secondary | ICD-10-CM | POA: Diagnosis not present

## 2016-12-26 DIAGNOSIS — M25512 Pain in left shoulder: Secondary | ICD-10-CM | POA: Diagnosis not present

## 2016-12-26 DIAGNOSIS — M7989 Other specified soft tissue disorders: Secondary | ICD-10-CM | POA: Diagnosis not present

## 2016-12-26 DIAGNOSIS — R079 Chest pain, unspecified: Secondary | ICD-10-CM | POA: Diagnosis not present

## 2016-12-26 NOTE — ED Triage Notes (Signed)
Patient here with complaint of pain to left chest and upper back. Patient states that he has swelling to his back. Patient states that he has a history of PE and this feels similar. Patient denies shortness of breath. Patient states that he is currently taking Lovenox.

## 2016-12-27 ENCOUNTER — Encounter: Payer: Self-pay | Admitting: Emergency Medicine

## 2016-12-27 ENCOUNTER — Emergency Department: Payer: 59

## 2016-12-27 ENCOUNTER — Emergency Department
Admission: EM | Admit: 2016-12-27 | Discharge: 2016-12-27 | Disposition: A | Discharge status: Home / Self Care | Payer: 59 | Attending: Emergency Medicine | Admitting: Emergency Medicine

## 2016-12-27 DIAGNOSIS — M7552 Bursitis of left shoulder: Secondary | ICD-10-CM

## 2016-12-27 DIAGNOSIS — M7989 Other specified soft tissue disorders: Secondary | ICD-10-CM | POA: Diagnosis not present

## 2016-12-27 DIAGNOSIS — R0789 Other chest pain: Secondary | ICD-10-CM

## 2016-12-27 DIAGNOSIS — M898X1 Other specified disorders of bone, shoulder: Secondary | ICD-10-CM

## 2016-12-27 DIAGNOSIS — R079 Chest pain, unspecified: Secondary | ICD-10-CM | POA: Diagnosis not present

## 2016-12-27 LAB — FIBRIN DERIVATIVES D-DIMER (ARMC ONLY): Fibrin derivatives D-dimer (ARMC): 91.24 (ref 0.00–499.00)

## 2016-12-27 LAB — CBC
HCT: 45.4 % (ref 40.0–52.0)
Hemoglobin: 15.2 g/dL (ref 13.0–18.0)
MCH: 31 pg (ref 26.0–34.0)
MCHC: 33.5 g/dL (ref 32.0–36.0)
MCV: 92.5 fL (ref 80.0–100.0)
Platelets: 225 10*3/uL (ref 150–440)
RBC: 4.91 MIL/uL (ref 4.40–5.90)
RDW: 14.1 % (ref 11.5–14.5)
WBC: 10.1 10*3/uL (ref 3.8–10.6)

## 2016-12-27 LAB — BASIC METABOLIC PANEL
Anion gap: 7 (ref 5–15)
BUN: 13 mg/dL (ref 6–20)
CO2: 27 mmol/L (ref 22–32)
Calcium: 9.3 mg/dL (ref 8.9–10.3)
Chloride: 104 mmol/L (ref 101–111)
Creatinine, Ser: 1.46 mg/dL — ABNORMAL HIGH (ref 0.61–1.24)
GFR calc Af Amer: >60 mL/min (ref >60)
GFR calc non Af Amer: 54 mL/min — ABNORMAL LOW (ref >60)
Glucose, Bld: 124 mg/dL — ABNORMAL HIGH (ref 65–99)
Potassium: 3.9 mmol/L (ref 3.5–5.1)
Sodium: 138 mmol/L (ref 135–145)

## 2016-12-27 LAB — TROPONIN I
Troponin I: <0.03 ng/mL (ref <0.03)
Troponin I: <0.03 ng/mL (ref <0.03)

## 2016-12-27 MED ORDER — OXYCODONE-ACETAMINOPHEN 5-325 MG PO TABS: 1.0000 | ORAL_TABLET | ORAL | 0 refills | Status: DC | PRN

## 2016-12-27 MED ORDER — SODIUM CHLORIDE 0.9 % IV BOLUS (SEPSIS)
1000.0000 mL | Freq: Once | INTRAVENOUS | Status: AC
  Administered 2016-12-27: 1000 mL via INTRAVENOUS

## 2016-12-27 MED ORDER — IOPAMIDOL (ISOVUE-370) INJECTION 76%
75.0000 mL | Freq: Once | INTRAVENOUS | Status: AC | PRN
  Administered 2016-12-27: 75 mL via INTRAVENOUS

## 2016-12-27 MED ORDER — ENOXAPARIN SODIUM 120 MG/0.8ML ~~LOC~~ SOLN
115.0000 mg | Freq: Once | SUBCUTANEOUS | Status: AC
  Administered 2016-12-27: 115 mg via SUBCUTANEOUS
  Filled 2016-12-27: qty 0.8

## 2016-12-27 MED ORDER — ONDANSETRON HCL 4 MG/2ML IJ SOLN
4.0000 mg | Freq: Once | INTRAMUSCULAR | Status: AC
  Administered 2016-12-27: 4 mg via INTRAVENOUS
  Filled 2016-12-27: qty 2

## 2016-12-27 MED ORDER — METHYLPREDNISOLONE 4 MG PO TBPK: ORAL_TABLET | ORAL | 0 refills | Status: DC

## 2016-12-27 MED ORDER — MORPHINE SULFATE (PF) 4 MG/ML IV SOLN
4.0000 mg | Freq: Once | INTRAVENOUS | Status: AC
  Administered 2016-12-27: 4 mg via INTRAVENOUS
  Filled 2016-12-27: qty 1

## 2016-12-27 NOTE — ED Notes (Signed)
Patient transported to X-ray via wheelchair with Sudie Bailey, radiology

## 2016-12-27 NOTE — Discharge Instructions (Signed)
1. You may take pain medicine as needed (Percocet #15). 2. Take steroid taper as prescribed. 3. You may wear sling as needed for comfort. 4. Return to the ER for worsening symptoms, persistent vomiting, difficulty breathing or other concerns.

## 2016-12-27 NOTE — ED Notes (Signed)
Pt transported to CT and [place].

## 2016-12-27 NOTE — ED Provider Notes (Signed)
[facility] Emergency Department Provider Note   ____________________________________________   First MD Initiated Contact with Patient 12/27/16 0222     (approximate)  I have reviewed the triage vital signs and the nursing notes.   HISTORY  Chief Complaint Back Pain and Chest Pain    HPI Justin [name] is a 51 y.o. male who presents to the ED from home with a chief complaint of left back, chest and left upper arm pain. Patient has a history of DVT, PE, currently taking Lovenox. Reports a 2 day history of left shoulder blade pain and swelling which radiates along his left chest and arm. Patient is right hand dominant and denies recent fall/injury/trauma. Concerned he has another blood clot. Denies fever, chills, shortness of breath, abdominal pain, nausea, vomiting. Denies recent travel. Compliant with Lovenox. Nothing makes his pain better. Movement makes his pain worse.   Past Medical History:  Diagnosis Date  . Arm vein blood clot    right  . DVT (deep venous thrombosis) ([place]) 2012; January 2016   History of right arm; now right lower shotty  . GERD (gastroesophageal reflux disease)   . Rib pain on right side 08/04/2016   RT rib pain that radiates around to the back. Level 8 of 10.  Constant. Started 1 mo ago, 1 week after DVT.  Marland Kitchen Vascular thoracic outlet syndrome    Right     Patient Active Problem List   Diagnosis Date Noted  . TMJ tenderness, right 12/14/2016  . Lupus anticoagulant positive 07/08/2016  . DVT (deep venous thrombosis) ([place]) 07/08/2016  . Thoracic outlet syndrome 07/08/2016  . Pain of left scapula 07/05/2016  . Dyspnea 07/05/2016  . Left subclavian vein thrombosis ([place]) 06/29/2016  . Abnormal finding on EKG 10/04/2014  . Atypical chest pain 10/04/2014    Past Surgical History:  Procedure Laterality Date  . CAROTID ANGIOGRAM    . KNEE CARTILAGE SURGERY     Performed for cracked patella and torn meniscus  . Right  upper extremity thrombectomy  2012  . SCALENOTOMY W/O RESECTION CERVICAL RIB  2012   4 right-sided thoracic outlet syndrome    Prior to Admission medications   Medication Sig Start Date End Date Taking? Authorizing Provider  enoxaparin (LOVENOX) 150 MG/ML injection INJECT 0.74 MLS (110 MG TOTAL) INTO THE SKIN 2 (TWO) TIMES DAILY. 12/06/16 12/06/17 Yes Lloyd Huger, MD  HYDROcodone-acetaminophen (NORCO/VICODIN) 5-325 MG tablet Take 1 tablet by mouth every 6 (six) hours as needed for moderate pain.   Yes [provider]  topiramate (TOPAMAX) 50 MG tablet Take 50 mg by mouth daily. 01/28/16 12/26/16 Yes [provider]  methylPREDNISolone (MEDROL DOSEPAK) 4 MG TBPK tablet Take as directed 12/27/16   Paulette Blanch, MD  oxyCODONE-acetaminophen (ROXICET) 5-325 MG tablet Take 1 tablet by mouth every 4 (four) hours as needed for severe pain. 12/27/16   Paulette Blanch, MD    Allergies Patient has no known allergies.  Family History  Problem Relation Age of Onset  . Heart disease Father   . Breast cancer Father   . Diabetes Father   . Heart attack Father   . Cancer Maternal Grandmother   . Leukemia Maternal Grandfather     Social History Social History  Substance Use Topics  . Smoking status: Former Smoker    Packs/day: 0.50  . Smokeless tobacco: Never Used     Comment: quit smoking in 2010  . Alcohol use No  Review of Systems  Constitutional: No fever/chills. Eyes: No visual changes. ENT: No sore throat. Cardiovascular: Positive for left chest pain. Respiratory: Denies shortness of breath. Gastrointestinal: No abdominal pain.  No nausea, no vomiting.  No diarrhea.  No constipation. Genitourinary: Negative for dysuria. Musculoskeletal: Positive for left arm and shoulder blade pain. Negative for back pain. Skin: Negative for rash. Neurological: Negative for headaches, focal weakness or numbness.   ____________________________________________   PHYSICAL  EXAM:  VITAL SIGNS: ED Triage Vitals  Enc Vitals Group     BP 12/26/16 2358 (!) 160/72     Pulse Rate 12/26/16 2358 72     Resp 12/26/16 2358 18     Temp 12/26/16 2358 98 F (36.7 C)     Temp Source 12/26/16 2358 Oral     SpO2 12/26/16 2358 97 %     Weight 12/26/16 2359 260 lb (117.9 kg)     Height 12/26/16 2359 5\' 11"  (1.803 m)     Head Circumference --      Peak Flow --      Pain Score 12/26/16 2357 10     Pain Loc --      Pain Edu? --      Excl. in [place]? --     Constitutional: Alert and oriented. Well appearing and in no acute distress. Eyes: Conjunctivae are normal. PERRL. EOMI. Head: Atraumatic. Nose: No congestion/rhinnorhea. Mouth/Throat: Mucous membranes are moist.  Oropharynx non-erythematous. Neck: No stridor.   Cardiovascular: Normal rate, regular rhythm. Grossly normal heart sounds.  Good peripheral circulation. Respiratory: Normal respiratory effort.  No retractions. Lungs CTAB. Tender to palpation left lateral ribs. Gastrointestinal: Soft and nontender. No distention. No abdominal bruits. No CVA tenderness. Musculoskeletal: Left scapula mildly swollen and tender to palpation. Left shoulder pain on range of motion. Left upper arm mildly swollen and painful to palpation. 2+ radial pulses. Brisk, less than 5 second capillary refill. No lower extremity tenderness nor edema.  No joint effusions. Neurologic:  Normal speech and language. No gross focal neurologic deficits are appreciated. No gait instability. Skin:  Skin is warm, dry and intact. No rash noted. Psychiatric: Mood and affect are normal. Speech and behavior are normal.  ____________________________________________   LABS (all labs ordered are listed, but only abnormal results are displayed)  Labs Reviewed  BASIC METABOLIC PANEL - Abnormal; Notable for the following:       Result Value   Glucose, Bld 124 (*)    Creatinine, Ser 1.46 (*)    GFR calc non Af Amer 54 (*)    All other components within  normal limits  CBC  TROPONIN I  FIBRIN DERIVATIVES D-DIMER ([facility] ONLY)  TROPONIN I   ____________________________________________  EKG  ED ECG REPORT I, Hildreth Robart J, the attending physician, personally viewed and interpreted this ECG.   Date: 12/27/2016  EKG Time: 2349  Rate: 68  Rhythm: normal EKG, normal sinus rhythm  Axis: Normal  Intervals:none  ST&T Change: Nonspecific   ____________________________________________  RADIOLOGY  Dg Chest 2 View  Result Date: 12/27/2016 CLINICAL DATA:  Back pain radiating to the axilla EXAM: CHEST  2 VIEW COMPARISON:  08/18/2016 FINDINGS: Surgical clips at the right apex. Linear scarring at the left lung base. No focal consolidation or pleural effusion. Stable cardiomediastinal silhouette. No pneumothorax. IMPRESSION: Scarring in the left lower lobe.  No acute infiltrate or edema. Electronically Signed   By: Donavan Foil M.D.   On: 12/27/2016 00:26   Ct Angio Chest Pe W/cm &/or Wo  Cm  Result Date: 12/27/2016 CLINICAL DATA:  Left chest and upper back pain. EXAM: CT ANGIOGRAPHY CHEST WITH CONTRAST TECHNIQUE: Multidetector CT imaging of the chest was performed using the standard protocol during bolus administration of intravenous contrast. Multiplanar CT image reconstructions and MIPs were obtained to evaluate the vascular anatomy. CONTRAST:  75 mL Isovue 370 intravenous COMPARISON:  08/18/2016 CT, 12/27/2016 radiographs FINDINGS: Cardiovascular: Satisfactory opacification of the pulmonary arteries to the segmental level. No evidence of pulmonary embolism. Normal heart size. No pericardial effusion. Mediastinum/Nodes: No enlarged mediastinal, hilar, or axillary lymph nodes. Thyroid gland, trachea, and esophagus demonstrate no significant findings. Lungs/Pleura: Stable linear scarring in the lung bases. No airspace consolidation. Central airways are patent and normal in caliber. Upper Abdomen: No acute abnormality. Musculoskeletal: No chest wall  abnormality. No acute or significant osseous findings. Review of the MIP images confirms the above findings. IMPRESSION: Stable linear scarring in the bases. No pulmonary emboli or other acute findings. Electronically Signed   By: Andreas Newport M.D.   On: 12/27/2016 03:32   US Venous Img Upper Uni Left  Result Date: 12/27/2016 CLINICAL DATA:  Pain and swelling in the left upper extremity for 1 day EXAM: Left UPPER EXTREMITY VENOUS DOPPLER ULTRASOUND TECHNIQUE: Gray-scale sonography with graded compression, as well as color Doppler and duplex ultrasound were performed to evaluate the upper extremity deep venous system from the level of the subclavian vein and including the jugular, axillary, basilic, radial, ulnar and upper cephalic vein. Spectral Doppler was utilized to evaluate flow at rest and with distal augmentation maneuvers. COMPARISON:  None. FINDINGS: Contralateral Subclavian Vein: Respiratory phasicity is normal and symmetric with the symptomatic side. No evidence of thrombus. Normal compressibility. Internal Jugular Vein: No evidence of thrombus. Normal compressibility, respiratory phasicity and response to augmentation. Subclavian Vein: No evidence of thrombus. Normal compressibility, respiratory phasicity and response to augmentation. Axillary Vein: No evidence of thrombus. Normal compressibility, respiratory phasicity and response to augmentation. Cephalic Vein: No evidence of thrombus. Normal compressibility, respiratory phasicity and response to augmentation. Basilic Vein: No evidence of thrombus. Normal compressibility, respiratory phasicity and response to augmentation. Brachial Veins: No evidence of thrombus. Normal compressibility, respiratory phasicity and response to augmentation. Radial Veins: No evidence of thrombus. Normal compressibility, respiratory phasicity and response to augmentation. Ulnar Veins: No evidence of thrombus. Normal compressibility, respiratory phasicity and  response to augmentation. Venous Reflux:  None visualized. Other Findings:  None visualized. IMPRESSION: No evidence of DVT within the left upper extremity. Electronically Signed   By: Andreas Newport M.D.   On: 12/27/2016 04:42    ____________________________________________   PROCEDURES  Procedure(s) performed: None  Procedures  Critical Care performed: No  ____________________________________________   INITIAL IMPRESSION / ASSESSMENT AND PLAN / ED COURSE  Pertinent labs & imaging results that were available during my care of the patient were reviewed by me and considered in my medical decision making (see chart for details).  51 year old male with history of DVT, lupus, thoracic outlet syndrome, on Lovenox, who presents with left scapular pain and swelling, left lateral chest pain and upper extremity pain and swelling. Initial lab work including troponin and d-dimer are unremarkable. However, given patient's history of clotting disorder, will obtain CT chest to evaluate for PE, LUE ultrasound to evaluate for DVT and reassess. Patient missed his nighttime dose of Lovenox because he was in the waiting room; will administer along with analgesia.  Clinical Course as of Dec 27 524  Mon Dec 27, 2016  0505 Patient resting  in no acute distress. Updated patient and spouse of negative CT and ultrasound results. Awaiting repeat troponin.  [JS]  [id number] Updated patient on negative repeat troponin. Feel patient has inflammatory factor likely causing bursitis. Given his upcoming surgery in August, will not prescribe both Medrol Dosepak and NSAIDs; of the two, I feel he would benefit more from [place]. He will follow-up with his orthopedic doctor this week. Strict return precautions given. Both verbalize understanding and agree with plan of care.  [JS]    Clinical Course User Index [JS] Paulette Blanch, MD     ____________________________________________   FINAL CLINICAL IMPRESSION(S) /  ED DIAGNOSES  Final diagnoses:  Bursitis of left shoulder  Atypical chest pain  Pain of left scapula      NEW MEDICATIONS STARTED DURING THIS VISIT:  New Prescriptions   METHYLPREDNISOLONE (MEDROL DOSEPAK) 4 MG TBPK TABLET    Take as directed   OXYCODONE-ACETAMINOPHEN (ROXICET) 5-325 MG TABLET    Take 1 tablet by mouth every 4 (four) hours as needed for severe pain.     Note:  This document was prepared using Dragon voice recognition software and may include unintentional dictation errors.    Paulette Blanch, MD 12/27/16 [phone]

## 2016-12-28 ENCOUNTER — Encounter: Payer: Self-pay | Admitting: Hematology and Oncology

## 2016-12-28 ENCOUNTER — Inpatient Hospital Stay: Payer: 59 | Attending: Hematology and Oncology

## 2016-12-28 ENCOUNTER — Inpatient Hospital Stay (HOSPITAL_BASED_OUTPATIENT_CLINIC_OR_DEPARTMENT_OTHER): Payer: 59 | Admitting: Hematology and Oncology

## 2016-12-28 VITALS — BP 119/82 | HR 68 | Temp 96.2°F | Resp 20 | Wt 268.2 lb

## 2016-12-28 DIAGNOSIS — M1990 Unspecified osteoarthritis, unspecified site: Secondary | ICD-10-CM

## 2016-12-28 DIAGNOSIS — Z86718 Personal history of other venous thrombosis and embolism: Secondary | ICD-10-CM | POA: Diagnosis not present

## 2016-12-28 DIAGNOSIS — Z79899 Other long term (current) drug therapy: Secondary | ICD-10-CM | POA: Insufficient documentation

## 2016-12-28 DIAGNOSIS — R918 Other nonspecific abnormal finding of lung field: Secondary | ICD-10-CM | POA: Diagnosis not present

## 2016-12-28 DIAGNOSIS — I82B12 Acute embolism and thrombosis of left subclavian vein: Secondary | ICD-10-CM

## 2016-12-28 DIAGNOSIS — K219 Gastro-esophageal reflux disease without esophagitis: Secondary | ICD-10-CM | POA: Diagnosis not present

## 2016-12-28 DIAGNOSIS — M5134 Other intervertebral disc degeneration, thoracic region: Secondary | ICD-10-CM | POA: Diagnosis not present

## 2016-12-28 DIAGNOSIS — Z7901 Long term (current) use of anticoagulants: Secondary | ICD-10-CM | POA: Diagnosis not present

## 2016-12-28 DIAGNOSIS — I82511 Chronic embolism and thrombosis of right femoral vein: Secondary | ICD-10-CM

## 2016-12-28 DIAGNOSIS — Z87891 Personal history of nicotine dependence: Secondary | ICD-10-CM | POA: Insufficient documentation

## 2016-12-28 NOTE — Progress Notes (Signed)
Patient seen in ED on Sunday night.  States he had inflammation in his left shoulder.  Put on Medrol Dose Pak. States he feels like he has a crick in his neck.  When he swallows he has pain in his neck also.  Otherwise,  no complaints.

## 2016-12-28 NOTE — Progress Notes (Signed)
[place] [facility] day:  12/28/2016  Chief Complaint: Justin [name] is a 51 y.o. male with recurrent thrombosis who is seen for 3 month assessment on Lovenox.    HPI:  The patient was last seen in the hematology clinic on 09/17/2016.  At that time, he continuesd to have pain in his ribs. He was seen orthopedics. He had been given injection for bone spurs. He noted no relief. Surgery was being considered.  The patient was seen in the [facility] ER on 12/27/2016.  He presented with left shoulder pain.  He felt a knot in his left scapula.  He was put on a Medrol Dose pak.  He notes surgery scheduled on 01/13/2017 with Dr. Bland Span, neurosurgeon at [facility].  He notes injections in his spine did not work.   Past Medical History:  Diagnosis Date  . Arm vein blood clot    right  . DVT (deep venous thrombosis) ([place]) 2012; January 2016   History of right arm; now right lower shotty  . GERD (gastroesophageal reflux disease)   . Rib pain on right side 08/04/2016   RT rib pain that radiates around to the back. Level 8 of 10.  Constant. Started 1 mo ago, 1 week after DVT.  Marland Kitchen Vascular thoracic outlet syndrome    Right     Past Surgical History:  Procedure Laterality Date  . CAROTID ANGIOGRAM    . KNEE CARTILAGE SURGERY     Performed for cracked patella and torn meniscus  . Right upper extremity thrombectomy  2012  . SCALENOTOMY W/O RESECTION CERVICAL RIB  2012   4 right-sided thoracic outlet syndrome    Family History  Problem Relation Age of Onset  . Heart disease Father   . Breast cancer Father   . Diabetes Father   . Heart attack Father   . Cancer Maternal Grandmother   . Leukemia Maternal Grandfather     Social History:  reports that he has quit smoking. He smoked 0.50 packs per day. He has never used smokeless tobacco. He reports that he does not drink alcohol or use drugs.  He previously smoked 1/2 pack a day for 10 years.  He stopped smoking in his  60s.  He lives in [place].  He is a Glass blower/designer.  He denies any exposure to radiation or toxins.  He has a son.  The patient is accompanied by his wife, [name], today.  Allergies: No Known Allergies  Current Medications: Current Outpatient Prescriptions  Medication Sig Dispense Refill  . enoxaparin (LOVENOX) 150 MG/ML injection INJECT 0.74 MLS (110 MG TOTAL) INTO THE SKIN 2 (TWO) TIMES DAILY. 60 Syringe 0  . HYDROcodone-acetaminophen (NORCO/VICODIN) 5-325 MG tablet Take 1 tablet by mouth every 6 (six) hours as needed for moderate pain.    . methylPREDNISolone (MEDROL DOSEPAK) 4 MG TBPK tablet Take as directed 21 tablet 0  . oxyCODONE-acetaminophen (ROXICET) 5-325 MG tablet Take 1 tablet by mouth every 4 (four) hours as needed for severe pain. 15 tablet 0  . Multiple Vitamin (MULTI VITAMIN DAILY PO) Take 1 tablet by mouth daily.    Marland Kitchen topiramate (TOPAMAX) 50 MG tablet Take 50 mg by mouth daily.     Current Facility-Administered Medications  Medication Dose Route Frequency Provider Last Rate Last Dose  . lidocaine (PF) (XYLOCAINE) 1 % injection 0.3 mL  0.3 mL Other Once Magnus Sinning, MD      . methylPREDNISolone acetate (DEPO-MEDROL) injection 80 mg  80 mg Other Once Magnus Sinning, MD        Review of Systems:  GENERAL:  Feels "the same".  No fevers or sweats.  Weight up 12 pounds. PERFORMANCE STATUS (ECOG):  1 HEENT:  Visual changes since last year.  No runny nose, sore throat, mouth sores or tenderness. Lungs: Shortness of breath at times  No cough.  No hemoptysis. Cardiac:  No chest pain, palpitations, orthopnea, or PND. GI:  No nausea, vomiting, diarrhea, constipation, melena or hematochezia. GU:  No urgency, frequency, dysuria, or hematuria. Musculoskeletal:  Pain in left medial scapula.  Right rib pain secondary to T9/10 exophytic boney lesion.  No muscle tenderness. Extremities:  No pain or swelling. Skin:  Bruising and knots where Lovenox injections.  Neuro:  No headache,  numbness or weakness, balance or coordination issues. Endocrine:  No diabetes, thyroid issues, hot flashes or night sweats. Psych:  No mood changes, depression or anxiety. Pain:  Neck pain (6 out of 10). Review of systems:  All other systems reviewed and found to be negative.  Physical Exam: Blood pressure 119/82, pulse 68, temperature (!) 96.2 F (35.7 C), temperature source Tympanic, resp. rate 20, weight 268 lb 3 oz (121.6 kg). GENERAL:  Well developed, well nourished, muscular gentleman sitting comfortably in the exam room in no acute distress. MENTAL STATUS:  Alert and oriented to person, place and time. HEAD: Wearing a [facility] cap.  Alopecia.  Gray beard.  Normocephalic, atraumatic, face symmetric, no Cushingoid features. EYES:  Glasses.  Brown eyes.  Pupils equal round and reactive to light and accomodation.  No conjunctivitis or scleral icterus. ENT:  Oropharynx clear without lesion.  Tongue normal. Mucous membranes moist.  RESPIRATORY:  Clear to auscultation without rales, wheezes or rhonchi. CARDIOVASCULAR:  Regular rate and rhythm without murmur, rub or gallop. ABDOMEN:  Soft, non-tender, with active bowel sounds, and no hepatosplenomegaly.  No masses. SKIN:  No rashes, ulcers or lesions. EXTREMITIES: Mild right upper extremity edema.  Pain left medial scapula.  No skin discoloration or tenderness.  No palpable cords. LYMPH NODES: No palpable cervical, supraclavicular, axillary or inguinal adenopathy  NEUROLOGICAL: Unremarkable. PSYCH:  Appropriate.   Admission on 12/27/2016, Discharged on 12/27/2016  Component Date Value Ref Range Status  . Sodium 12/27/2016 138  135 - 145 mmol/L Final  . Potassium 12/27/2016 3.9  3.5 - 5.1 mmol/L Final  . Chloride 12/27/2016 104  101 - 111 mmol/L Final  . CO2 12/27/2016 27  22 - 32 mmol/L Final  . Glucose, Bld 12/27/2016 124* 65 - 99 mg/dL Final  . BUN 12/27/2016 13  6 - 20 mg/dL Final  . Creatinine, Ser 12/27/2016 1.46* 0.61 - 1.24 mg/dL  Final  . Calcium 12/27/2016 9.3  8.9 - 10.3 mg/dL Final  . GFR calc non Af Amer 12/27/2016 54* >60 mL/min Final  . GFR calc Af Amer 12/27/2016 >60  >60 mL/min Final   Comment: (NOTE) The eGFR has been calculated using the CKD EPI equation. This calculation has not been validated in all clinical situations. eGFR's persistently <60 mL/min signify possible Chronic Kidney Disease.   . Anion gap 12/27/2016 7  5 - 15 Final  . WBC 12/27/2016 10.1  3.8 - 10.6 K/uL Final  . RBC 12/27/2016 4.91  4.40 - 5.90 MIL/uL Final  . Hemoglobin 12/27/2016 15.2  13.0 - 18.0 g/dL Final  . HCT 12/27/2016 45.4  40.0 - 52.0 % Final  . MCV 12/27/2016 92.5  80.0 - 100.0 fL Final  .  MCH 12/27/2016 31.0  26.0 - 34.0 pg Final  . MCHC 12/27/2016 33.5  32.0 - 36.0 g/dL Final  . RDW 12/27/2016 14.1  11.5 - 14.5 % Final  . Platelets 12/27/2016 225  150 - 440 K/uL Final  . Troponin I 12/27/2016 <0.03  <0.03 ng/mL Final  . Fibrin derivatives D-dimer ([facility]) 12/27/2016 91.24  0.00 - 499.00 Final   Comment: (NOTE) <> Exclusion of Venous Thromboembolism (VTE) - OUTPATIENT ONLY   (Emergency Department or [place])   0-499 ng/ml (FEU): With a low to intermediate pretest probability                      for VTE this test result excludes the diagnosis                      of VTE.   >499 ng/ml (FEU) : VTE not excluded; additional work up for VTE is                      required. <> Testing on Inpatients and Evaluation of Disseminated Intravascular   Coagulation (DIC) Reference Range:   0-499 ng/ml (FEU)   . Troponin I 12/27/2016 <0.03  <0.03 ng/mL Final    Assessment:  JASHON ISHIDA is a 51 y.o. male with recurrent thrombosis x 4.  He developed a right upper extremity clot in 2010. He underwent thrombolysis.  He was on Coumadin x 6 months.  He developed a recurrent clot in the right upper extremity in 2011.  He underwent thoracic outlet decompression.  He was on Coumadin for 6-9 months.  He developed a right lower extremity  DVT in 2016.  He was started on Xarelto.  He developed a left upper extremity DVT on 06/07/2016 while on Xarelto.  Duplex revealed a near occlusive DVT in the lateral aspect of the left subclavian vein and nonocclusive thrombus extending into the adjacent axillary vein.  He was started on Lovenox.  Hypercoagulable work-up on 06/29/2016 revealed the following normal studies:  CBC with diff, Factor V Leiden, prothrombin gene mutation, protein C antigen and activity, protein S antigen and activity, ATIII antigen and activity, anticardiolipin antibodies and beta-2 glycoprotein antibodies.  Lupus anticoagulant panel was positive on Xarelto and Lovenox.  Chest CT angiogram on 07/02/2016 revealed no evidence of pulmonary embolism.  There were small pulmonary nodules, largest 3 mm, of unclear significance.  Bone scan on 08/04/2016 revealed a focus of moderately increased uptake at approximately the T9 right paravertebral region. Chest CT on 07/20/2016 revealed a large right sided osteophyte at the T9-T10 level which was felt to explain the increased right paravertebral uptake uptake.  There  was also mildly increased uptake in the body of L2. Review of the lumbar spine on the abdominal CT scan of the same date revealed no definite radiographic abnormality in the body of L2 to explain the mildly increased uptake.  There was no abnormal uptake within the ribs or elsewhere within the skeleton.  Thoracic spine MRI on 09/14/2016 revealed mild degenerative disc disease in the thoracic spine with non-compressive disc bulges at T5-6 and below.  There was no disc herniation or compressive stenosis of the canal or foramina.  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 On the right at T9-10, there was fairly bulky osteophytes emanating from the right costovertebral articulation. There was no evidence of neural compression secondary to this  or the other  bony degenerative changes.  There was ordinary mild facet osteoarthritis without edematous change or significant hypertrophy.  Symptomatically, he has pain from bulky osteophytes.  He has not had relief with injections.  [facility].  Plan: 1.  Labs today:  CBC with diff, CMP. 2.  Continue Lovenox.  Injections to be held 24 hours preop per surgery. 3.  RTC in 3 months for MD assess and labs (CBC with diff, CMP).   Lequita Asal, MD  12/28/2016, 12:30 PM

## 2017-01-14 DIAGNOSIS — R918 Other nonspecific abnormal finding of lung field: Secondary | ICD-10-CM | POA: Diagnosis not present

## 2017-01-14 DIAGNOSIS — M898X8 Other specified disorders of bone, other site: Secondary | ICD-10-CM | POA: Diagnosis not present

## 2017-01-14 DIAGNOSIS — M5442 Lumbago with sciatica, left side: Secondary | ICD-10-CM | POA: Diagnosis not present

## 2017-01-14 DIAGNOSIS — Z4682 Encounter for fitting and adjustment of non-vascular catheter: Secondary | ICD-10-CM | POA: Diagnosis not present

## 2017-01-14 DIAGNOSIS — R222 Localized swelling, mass and lump, trunk: Secondary | ICD-10-CM | POA: Diagnosis not present

## 2017-01-14 DIAGNOSIS — J90 Pleural effusion, not elsewhere classified: Secondary | ICD-10-CM | POA: Diagnosis not present

## 2017-01-14 DIAGNOSIS — M5414 Radiculopathy, thoracic region: Secondary | ICD-10-CM | POA: Diagnosis not present

## 2017-01-14 DIAGNOSIS — M5184 Other intervertebral disc disorders, thoracic region: Secondary | ICD-10-CM | POA: Diagnosis not present

## 2017-01-14 DIAGNOSIS — J9811 Atelectasis: Secondary | ICD-10-CM | POA: Diagnosis not present

## 2017-01-19 DIAGNOSIS — J9811 Atelectasis: Secondary | ICD-10-CM | POA: Diagnosis not present

## 2017-01-19 DIAGNOSIS — M5414 Radiculopathy, thoracic region: Secondary | ICD-10-CM | POA: Diagnosis not present

## 2017-01-21 ENCOUNTER — Other Ambulatory Visit: Payer: Self-pay | Admitting: Oncology

## 2017-01-21 ENCOUNTER — Encounter: Payer: Self-pay | Admitting: Family Medicine

## 2017-01-21 ENCOUNTER — Ambulatory Visit (INDEPENDENT_AMBULATORY_CARE_PROVIDER_SITE_OTHER): Payer: 59 | Admitting: Family Medicine

## 2017-01-21 ENCOUNTER — Other Ambulatory Visit: Payer: Self-pay | Admitting: Hematology and Oncology

## 2017-01-21 VITALS — BP 118/76 | HR 69 | Temp 98.5°F | Wt 261.0 lb

## 2017-01-21 DIAGNOSIS — R918 Other nonspecific abnormal finding of lung field: Secondary | ICD-10-CM

## 2017-01-21 DIAGNOSIS — R0781 Pleurodynia: Secondary | ICD-10-CM

## 2017-01-21 DIAGNOSIS — R760 Raised antibody titer: Secondary | ICD-10-CM

## 2017-01-21 DIAGNOSIS — I82B12 Acute embolism and thrombosis of left subclavian vein: Secondary | ICD-10-CM

## 2017-01-21 DIAGNOSIS — M898X1 Other specified disorders of bone, shoulder: Secondary | ICD-10-CM

## 2017-01-21 NOTE — Progress Notes (Signed)
BP 118/76 (BP Location: Right Arm, Patient Position: Sitting, Cuff Size: Large)   Pulse 69   Temp 98.5 F (36.9 C)   Wt 261 lb (118.4 kg)   SpO2 99%   BMI 36.40 kg/m    Subjective:    Patient ID: Justin [name], male    DOB: 07-Mar-1966, 51 y.o.   MRN: [id number]  HPI: Justin [name] is a 51 y.o. male  Chief Complaint  Patient presents with  . Hospitalization Follow-up    Back Surgery. Patient states he is still extrememly sore but is doing okay.   Pt presents today for hospital f/u following surgical excision of large bone growth on rib. Doing fairly well since d/c. Pain is controlled at a 5-7 with oxycodone and tylenol. Breathing has been stable on home O2, which he was placed on since they had to deflate right lung for procedure. Incision sites without drainage, erythema, or fluid accumulation. Drain was removed a few days ago. Has upcoming f/u with surgeon in the next week or two. Has brought paperwork with him today to extend his work disability and also to grand FMLA to his wife, who is acting as primary caregiver.   Relevant past medical, surgical, family and social history reviewed and updated as indicated. Interim medical history since our last visit reviewed. Allergies and medications reviewed and updated.  Review of Systems  Constitutional: Negative.  Negative for chills, diaphoresis and fever.  HENT: Negative.   Eyes: Negative.   Respiratory: Negative.   Cardiovascular: Negative.   Genitourinary: Negative.   Musculoskeletal: Negative.   Skin: Positive for wound.  Neurological: Negative.   Psychiatric/Behavioral: Negative.    Per HPI unless specifically indicated above     Objective:    BP 118/76 (BP Location: Right Arm, Patient Position: Sitting, Cuff Size: Large)   Pulse 69   Temp 98.5 F (36.9 C)   Wt 261 lb (118.4 kg)   SpO2 99%   BMI 36.40 kg/m   Wt Readings from Last 3 Encounters:  01/21/17 261 lb (118.4 kg)  12/28/16 268 lb 3 oz (121.6  kg)  12/26/16 260 lb (117.9 kg)    Physical Exam  Constitutional: He is oriented to person, place, and time. He appears well-developed and well-nourished. No distress.  HENT:  Head: Atraumatic.  Eyes: Pupils are equal, round, and reactive to light. Conjunctivae are normal. No scleral icterus.  Neck: Normal range of motion. Neck supple.  Cardiovascular: Normal rate and normal heart sounds.   Pulmonary/Chest: Effort normal and breath sounds normal. No respiratory distress.  Musculoskeletal: Normal range of motion.  Neurological: He is alert and oriented to person, place, and time.  Skin: Skin is warm and dry.  3 inch right lateral incision healing well, dermabond intact. No evidence of seroma or cellulitis. Mildly ttp over surrounding area.  Small drain incision healing well, suture intact. No drainage from the area.   Psychiatric: He has a normal mood and affect. His behavior is normal.  Nursing note and vitals reviewed.     Assessment & Plan:   Problem List Items Addressed This Visit    None    Visit Diagnoses    Rib pain on right side    -  Primary   Healing well after successful surgery, no evidence of wound infection. Pain under decent control.Will remove suture and check wounds early next week.        Follow up plan: Return in about 4 days (around 01/25/2017) for Wound  check, suture removal.

## 2017-01-23 NOTE — Patient Instructions (Signed)
Follow up in 4 days 

## 2017-01-27 ENCOUNTER — Ambulatory Visit (INDEPENDENT_AMBULATORY_CARE_PROVIDER_SITE_OTHER): Payer: 59 | Admitting: Family Medicine

## 2017-01-27 ENCOUNTER — Encounter: Payer: Self-pay | Admitting: Family Medicine

## 2017-01-27 VITALS — BP 156/84 | HR 84 | Temp 98.1°F | Wt 258.2 lb

## 2017-01-27 DIAGNOSIS — S21202D Unspecified open wound of left back wall of thorax without penetration into thoracic cavity, subsequent encounter: Secondary | ICD-10-CM

## 2017-01-27 DIAGNOSIS — S21201D Unspecified open wound of right back wall of thorax without penetration into thoracic cavity, subsequent encounter: Secondary | ICD-10-CM

## 2017-01-27 NOTE — Progress Notes (Signed)
BP (!) 156/84   Pulse 84   Temp 98.1 F (36.7 C)   Wt 258 lb 3.2 oz (117.1 kg)   SpO2 96%   BMI 36.01 kg/m    Subjective:    Patient ID: Justin [name], male    DOB: [birth year], 51 y.o.   MRN: [id number]  HPI: Justin [name] is a 51 y.o. male  Chief Complaint  Patient presents with  . Wound Check   Wound healing well. Has 2 stiches in the small wound and glue in the larger wound. Seeing surgery next week. He notes that his pain is under good control except for the post-op pain. He is otherwise doing well with no other concerns or complaints at this time.   Relevant past medical, surgical, family and social history reviewed and updated as indicated. Interim medical history since our last visit reviewed. Allergies and medications reviewed and updated.  Review of Systems  Constitutional: Negative.   Respiratory: Negative.   Cardiovascular: Negative.   Psychiatric/Behavioral: Negative.     Per HPI unless specifically indicated above     Objective:    BP (!) 156/84   Pulse 84   Temp 98.1 F (36.7 C)   Wt 258 lb 3.2 oz (117.1 kg)   SpO2 96%   BMI 36.01 kg/m   Wt Readings from Last 3 Encounters:  01/27/17 258 lb 3.2 oz (117.1 kg)  01/21/17 261 lb (118.4 kg)  12/28/16 268 lb 3 oz (121.6 kg)    Physical Exam  Constitutional: He is oriented to person, place, and time. He appears well-developed and well-nourished. No distress.  HENT:  Head: Normocephalic and atraumatic.  Right Ear: Hearing normal.  Left Ear: Hearing normal.  Nose: Nose normal.  Eyes: Conjunctivae and lids are normal. Right eye exhibits no discharge. Left eye exhibits no discharge. No scleral icterus.  Cardiovascular: Normal rate, regular rhythm, normal heart sounds and intact distal pulses.  Exam reveals no gallop and no friction rub.   No murmur heard. Pulmonary/Chest: Effort normal and breath sounds normal. No respiratory distress. He has no wheezes. He has no rales. He exhibits no  tenderness.  Musculoskeletal: Normal range of motion.  Neurological: He is alert and oriented to person, place, and time.  Skin: Skin is warm, dry and intact. No rash noted. No erythema. No pallor.  3.5 inch well healing wound with dermabond on it on R side, no redness, no swelling, healing well. 2cm wound on R side with 2 sutures in it, healing well, no redness, no swelling, healing well.   Psychiatric: He has a normal mood and affect. His speech is normal and behavior is normal. Judgment and thought content normal. Cognition and memory are normal.  Nursing note and vitals reviewed.   Results for orders placed or performed during the hospital encounter of 93/26/71  Basic metabolic panel  Result Value Ref Range   Sodium 138 135 - 145 mmol/L   Potassium 3.9 3.5 - 5.1 mmol/L   Chloride 104 101 - 111 mmol/L   CO2 27 22 - 32 mmol/L   Glucose, Bld 124 (H) 65 - 99 mg/dL   BUN 13 6 - 20 mg/dL   Creatinine, Ser 1.46 (H) 0.61 - 1.24 mg/dL   Calcium 9.3 8.9 - 10.3 mg/dL   GFR calc non Af Amer 54 (L) >60 mL/min   GFR calc Af Amer >60 >60 mL/min   Anion gap 7 5 - 15  CBC  Result Value Ref Range  WBC 10.1 3.8 - 10.6 K/uL   RBC 4.91 4.40 - 5.90 MIL/uL   Hemoglobin 15.2 13.0 - 18.0 g/dL   HCT 45.4 40.0 - 52.0 %   MCV 92.5 80.0 - 100.0 fL   MCH 31.0 26.0 - 34.0 pg   MCHC 33.5 32.0 - 36.0 g/dL   RDW 14.1 11.5 - 14.5 %   Platelets 225 150 - 440 K/uL  Troponin I  Result Value Ref Range   Troponin I <0.03 <0.03 ng/mL  Fibrin derivatives D-Dimer  Result Value Ref Range   Fibrin derivatives D-dimer ([facility]) 91.24 0.00 - 499.00  Troponin I  Result Value Ref Range   Troponin I <0.03 <0.03 ng/mL      Assessment & Plan:   Problem List Items Addressed This Visit    None    Visit Diagnoses    Wound of left side of back, subsequent encounter    -  Primary   Healing well. No sign of infection. Sutures removed today. Follow up with surgeon as needed. Advised him as he is only having post-op pain  to call surg for oxy.       Follow up plan: Return 2-3 weeks, for with Apolonio Schneiders at patient's request.

## 2017-01-30 ENCOUNTER — Encounter: Payer: Self-pay | Admitting: Hematology and Oncology

## 2017-02-02 ENCOUNTER — Telehealth: Payer: Self-pay | Admitting: Family Medicine

## 2017-02-02 NOTE — Telephone Encounter (Signed)
Continue d/c of stool softener, can take imodium prn to help reduce frequency of diarrhea. Push fluids, BRAT diet

## 2017-02-02 NOTE — Telephone Encounter (Signed)
Called and spoke with [name] and she stated that she will be getting him some imodium.

## 2017-02-02 NOTE — Telephone Encounter (Signed)
Pts wife, [name], called and stated that the pt has been having frequent bowel movements and she was afraid he would get dehydrated. She would like to know what he could take help with this issue. He hasn't taken his stool softener in 2 days.

## 2017-02-03 ENCOUNTER — Telehealth: Payer: Self-pay | Admitting: Family Medicine

## 2017-02-03 NOTE — Telephone Encounter (Signed)
I can do that.

## 2017-02-03 NOTE — Telephone Encounter (Signed)
Dr.Johnson is this something that you can do?

## 2017-02-03 NOTE — Telephone Encounter (Signed)
[name], pts wife, called and would like to know if she could have her FMLA extended for 1 more week. She is suppose to return September 4th but would like to push it out 1 more week.

## 2017-02-04 NOTE — Telephone Encounter (Signed)
Paper printer, needs signature from doctor, will fax after that.

## 2017-02-11 ENCOUNTER — Telehealth: Payer: Self-pay | Admitting: Family Medicine

## 2017-02-11 NOTE — Telephone Encounter (Signed)
Can you find out what she wants to talk about? Thanks!

## 2017-02-11 NOTE — Telephone Encounter (Signed)
Left message on machine for pt to return call to the office.  

## 2017-02-11 NOTE — Telephone Encounter (Signed)
Justin [name] says she wants to be home with him as he continues to need personal care and she does not feel safe to leave him at home.  She would like her note extended until the 17th.  Please advise  Justin [name] [phone]  Thank You

## 2017-02-11 NOTE — Telephone Encounter (Signed)
Routing to provider  

## 2017-02-11 NOTE — Telephone Encounter (Signed)
Please get patient scheduled to come in and see Dr.Johnson for a appointment.

## 2017-02-15 ENCOUNTER — Ambulatory Visit (INDEPENDENT_AMBULATORY_CARE_PROVIDER_SITE_OTHER): Payer: 59 | Admitting: Family Medicine

## 2017-02-15 DIAGNOSIS — S21201D Unspecified open wound of right back wall of thorax without penetration into thoracic cavity, subsequent encounter: Secondary | ICD-10-CM

## 2017-02-15 NOTE — Progress Notes (Signed)
Patient not seen today. Will return to surgeon to evaluate wound. Call with any concerns.

## 2017-02-16 DIAGNOSIS — Z5189 Encounter for other specified aftercare: Secondary | ICD-10-CM | POA: Diagnosis not present

## 2017-02-19 DIAGNOSIS — J9811 Atelectasis: Secondary | ICD-10-CM | POA: Diagnosis not present

## 2017-02-19 DIAGNOSIS — M5414 Radiculopathy, thoracic region: Secondary | ICD-10-CM | POA: Diagnosis not present

## 2017-02-22 ENCOUNTER — Telehealth: Payer: Self-pay | Admitting: Family Medicine

## 2017-02-22 NOTE — Telephone Encounter (Signed)
Ok, so Justin [name] is working on this?

## 2017-02-22 NOTE — Telephone Encounter (Signed)
I do not have any paperwork on this patient. Was this something that Justin [name] may have?

## 2017-02-22 NOTE — Telephone Encounter (Signed)
I see that paptient has paperwork in file at front desk informed Ms. Alwyn Ren of matter. She is working on Designer, jewellery notes printed out and the forms signed by Merrie Roof.

## 2017-02-22 NOTE — Telephone Encounter (Signed)
Yes I have checked with her and she is working on this as well I will route these messages to her to so that she is aware.

## 2017-02-22 NOTE — Telephone Encounter (Signed)
Patient came in last week regarding paperwork. Paperwork regarding [employer]; would like to know if we have faxed the paperwork or if patient needs to pick up paperwork.   Please Advise.  Thank you

## 2017-02-23 NOTE — Telephone Encounter (Signed)
Patient returned call. Patient will like copy. Copy will be placed in file at front desk.

## 2017-02-23 NOTE — Telephone Encounter (Signed)
Called patient and left voicemail to give office back. Faxed over paperwork and made a separate copy incase patient would like to pick up a copy of paperwork. Original placed in to be scanned.

## 2017-03-01 DIAGNOSIS — R918 Other nonspecific abnormal finding of lung field: Secondary | ICD-10-CM | POA: Diagnosis not present

## 2017-03-01 DIAGNOSIS — M549 Dorsalgia, unspecified: Secondary | ICD-10-CM | POA: Diagnosis not present

## 2017-03-01 DIAGNOSIS — Z87891 Personal history of nicotine dependence: Secondary | ICD-10-CM | POA: Diagnosis not present

## 2017-03-01 DIAGNOSIS — R0781 Pleurodynia: Secondary | ICD-10-CM | POA: Diagnosis not present

## 2017-03-01 DIAGNOSIS — Z5181 Encounter for therapeutic drug level monitoring: Secondary | ICD-10-CM | POA: Diagnosis not present

## 2017-03-01 DIAGNOSIS — J90 Pleural effusion, not elsewhere classified: Secondary | ICD-10-CM | POA: Diagnosis not present

## 2017-03-01 DIAGNOSIS — J9811 Atelectasis: Secondary | ICD-10-CM | POA: Diagnosis not present

## 2017-03-21 DIAGNOSIS — M5414 Radiculopathy, thoracic region: Secondary | ICD-10-CM | POA: Diagnosis not present

## 2017-03-21 DIAGNOSIS — J9811 Atelectasis: Secondary | ICD-10-CM | POA: Diagnosis not present

## 2017-04-01 ENCOUNTER — Encounter: Payer: Self-pay | Admitting: Hematology and Oncology

## 2017-04-01 ENCOUNTER — Inpatient Hospital Stay: Payer: 59 | Attending: Hematology and Oncology | Admitting: Hematology and Oncology

## 2017-04-01 ENCOUNTER — Other Ambulatory Visit: Payer: Self-pay | Admitting: *Deleted

## 2017-04-01 ENCOUNTER — Inpatient Hospital Stay: Payer: 59

## 2017-04-01 VITALS — BP 100/68 | HR 65 | Temp 96.1°F | Resp 20 | Wt 250.0 lb

## 2017-04-01 DIAGNOSIS — Z7901 Long term (current) use of anticoagulants: Secondary | ICD-10-CM | POA: Diagnosis not present

## 2017-04-01 DIAGNOSIS — Z86718 Personal history of other venous thrombosis and embolism: Secondary | ICD-10-CM | POA: Diagnosis not present

## 2017-04-01 DIAGNOSIS — I82B12 Acute embolism and thrombosis of left subclavian vein: Secondary | ICD-10-CM

## 2017-04-01 DIAGNOSIS — I82511 Chronic embolism and thrombosis of right femoral vein: Secondary | ICD-10-CM

## 2017-04-01 DIAGNOSIS — Z87891 Personal history of nicotine dependence: Secondary | ICD-10-CM | POA: Diagnosis not present

## 2017-04-01 DIAGNOSIS — K219 Gastro-esophageal reflux disease without esophagitis: Secondary | ICD-10-CM | POA: Diagnosis not present

## 2017-04-01 DIAGNOSIS — M1990 Unspecified osteoarthritis, unspecified site: Secondary | ICD-10-CM | POA: Insufficient documentation

## 2017-04-01 DIAGNOSIS — Z79899 Other long term (current) drug therapy: Secondary | ICD-10-CM

## 2017-04-01 LAB — CBC WITH DIFFERENTIAL/PLATELET
Basophils Absolute: 0.1 10*3/uL (ref 0–0.1)
Basophils Relative: 1 %
Eosinophils Absolute: 0.4 10*3/uL (ref 0–0.7)
Eosinophils Relative: 5 %
HCT: 43.2 % (ref 40.0–52.0)
Hemoglobin: 14.1 g/dL (ref 13.0–18.0)
Lymphocytes Relative: 25 %
Lymphs Abs: 1.9 10*3/uL (ref 1.0–3.6)
MCH: 29.5 pg (ref 26.0–34.0)
MCHC: 32.6 g/dL (ref 32.0–36.0)
MCV: 90.4 fL (ref 80.0–100.0)
Monocytes Absolute: 0.9 10*3/uL (ref 0.2–1.0)
Monocytes Relative: 11 %
Neutro Abs: 4.3 10*3/uL (ref 1.4–6.5)
Neutrophils Relative %: 58 %
Platelets: 283 10*3/uL (ref 150–440)
RBC: 4.78 MIL/uL (ref 4.40–5.90)
RDW: 14 % (ref 11.5–14.5)
WBC: 7.4 10*3/uL (ref 3.8–10.6)

## 2017-04-01 LAB — COMPREHENSIVE METABOLIC PANEL
ALT: 38 U/L (ref 17–63)
AST: 25 U/L (ref 15–41)
Albumin: 3.6 g/dL (ref 3.5–5.0)
Alkaline Phosphatase: 86 U/L (ref 38–126)
Anion gap: 10 (ref 5–15)
BUN: 11 mg/dL (ref 6–20)
CO2: 24 mmol/L (ref 22–32)
Calcium: 9.3 mg/dL (ref 8.9–10.3)
Chloride: 102 mmol/L (ref 101–111)
Creatinine, Ser: 1.08 mg/dL (ref 0.61–1.24)
GFR calc Af Amer: >60 mL/min (ref >60)
GFR calc non Af Amer: >60 mL/min (ref >60)
Glucose, Bld: 101 mg/dL — ABNORMAL HIGH (ref 65–99)
Potassium: 4.1 mmol/L (ref 3.5–5.1)
Sodium: 136 mmol/L (ref 135–145)
Total Bilirubin: 0.4 mg/dL (ref 0.3–1.2)
Total Protein: 7.6 g/dL (ref 6.5–8.1)

## 2017-04-01 NOTE — Progress Notes (Signed)
[facility] day:  04/01/2017  Chief Complaint: Justin [name] is a 51 y.o. male with recurrent thrombosis who is seen for 3 month assessment on Lovenox.    HPI:  The patient was last seen in the hematology clinic on 12/28/2016.  At that time, he had pain from bulky osteophytes.  He had not had relief with injections.  Surgery was scheduled at [facility] on 01/13/2017.  He underwent right lateral mini-thoracotomy for resection of a bony rib lesion on 01/14/2017 by Dr. Beverely Pace.  Notes indicate he underwent thoracic discectomy with decompression by anterior approach.  Post-operative approach was uneventful.  He was seen in the [place] ER on 03/01/2017 with the chest pain.  CXR was stable.  Chest CT angiogram revealed no evidence of pulmonary embolism. There was a small right-sided pleural effusion with associated compressive atelectasis.  There was venous collaterals noted along the anterior superior right chest wall, possibly positional versus a right subclavian stenosis.  During the interim, patient doing "ok" post operatively. Back pain has resolved. He has RIGHT rib pain and known atelectasis. Patient states, "They said I have fluid on my lungs". Patient notes a post operative infection. Patient has pain, rated 8/10, that is exacerbated by coughing and deep inspiration. Patient has followed up with his surgeon.   He is currently only taking one Enoxaparin dose per day citing that "no one has told me to go back to two like I was before". Patient denies any shortness of breath or pain in his extremities. Patient is eating well. He has lost 8 pounds since his last visit. Patient ambulating with the use of a rolling walker today in clinic.    Past Medical History:  Diagnosis Date  . Arm vein blood clot    right  . DVT (deep venous thrombosis) ([place]) 2012; January 2016   History of right arm; now right lower shotty  . GERD (gastroesophageal reflux  disease)   . Rib pain on right side 08/04/2016   RT rib pain that radiates around to the back. Level 8 of 10.  Constant. Started 1 mo ago, 1 week after DVT.  Marland Kitchen Vascular thoracic outlet syndrome    Right     Past Surgical History:  Procedure Laterality Date  . CAROTID ANGIOGRAM    . KNEE CARTILAGE SURGERY     Performed for cracked patella and torn meniscus  . Right upper extremity thrombectomy  2012  . SCALENOTOMY W/O RESECTION CERVICAL RIB  2012   4 right-sided thoracic outlet syndrome    Family History  Problem Relation Age of Onset  . Heart disease Father   . Breast cancer Father   . Diabetes Father   . Heart attack Father   . Cancer Maternal Grandmother   . Leukemia Maternal Grandfather     Social History:  reports that he has quit smoking. He smoked 0.50 packs per day. He has never used smokeless tobacco. He reports that he does not drink alcohol or use drugs.  He previously smoked 1/2 pack a day for 10 years.  He stopped smoking in his 24s.  He lives in [place].  He is a Glass blower/designer.  He denies any exposure to radiation or toxins.  He has a son.  The patient is accompanied by his wife, [name], today.  Allergies: No Known Allergies  Current Medications: Current Outpatient Prescriptions  Medication Sig Dispense Refill  . enoxaparin (LOVENOX) 150 MG/ML injection INJECT 0.74 MLS (  110 MG TOTAL) INTO THE SKIN 2 (TWO) TIMES DAILY. 60 Syringe 0  . Multiple Vitamin (MULTI VITAMIN DAILY PO) Take 1 tablet by mouth daily.    Marland Kitchen oxyCODONE (OXY IR/ROXICODONE) 5 MG immediate release tablet   0  . topiramate (TOPAMAX) 50 MG tablet Take 50 mg by mouth daily.     Current Facility-Administered Medications  Medication Dose Route Frequency Provider Last Rate Last Dose  . lidocaine (PF) (XYLOCAINE) 1 % injection 0.3 mL  0.3 mL Other Once Magnus Sinning, MD      . methylPREDNISolone acetate (DEPO-MEDROL) injection 80 mg  80 mg Other Once Magnus Sinning, MD        Review of Systems:   GENERAL:  Feels "ok".  No fevers or sweats.  Weight down 8 pounds. PERFORMANCE STATUS (ECOG):  1 HEENT:  Visual changes since last year.  No runny nose, sore throat, mouth sores or tenderness. Lungs: Shortness of breath at times  No cough.  No hemoptysis. Post-op pain. Cardiac:  No chest pain, palpitations, orthopnea, or PND. GI:  No nausea, vomiting, diarrhea, constipation, melena or hematochezia. GU:  No urgency, frequency, dysuria, or hematuria. Musculoskeletal:  Right rib pain post op.  Back pain resolved.  No muscle tenderness. Extremities:  No pain or swelling. Skin:  Bruising and knots where Lovenox injections.  Neuro:  No headache, numbness or weakness, balance or coordination issues. Endocrine:  No diabetes, thyroid issues, hot flashes or night sweats. Psych:  No mood changes, depression or anxiety. Pain:  Rib pain (8 out of 10). Review of systems:  All other systems reviewed and found to be negative.  Physical Exam: Blood pressure 100/68, pulse 65, temperature (!) 96.1 F (35.6 C), temperature source Tympanic, resp. rate 20, weight 250 lb (113.4 kg). GENERAL:  Well developed, well nourished, muscular gentleman sitting comfortably in the exam room in no acute distress.  He has a rolling walker at his side. MENTAL STATUS:  Alert and oriented to person, place and time. HEAD: Wearing a camo [facility] cap.  Alopecia.  Gray beard.  Normocephalic, atraumatic, face symmetric, no Cushingoid features. EYES:  Glasses.  Brown eyes.  Pupils equal round and reactive to light and accomodation.  No conjunctivitis or scleral icterus. ENT:  Oropharynx clear without lesion.  Tongue normal. Mucous membranes moist.  RESPIRATORY:  Clear to auscultation without rales, wheezes or rhonchi. CARDIOVASCULAR:  Regular rate and rhythm without murmur, rub or gallop. CHEST WALL:  Right lower thoracotomy site unremarkable (where pain localizes). ABDOMEN:  Soft, non-tender, with active bowel sounds, and no  hepatosplenomegaly.  No masses. SKIN:  No rashes, ulcers or lesions. EXTREMITIES: Mild right upper extremity edema.  No skin discoloration or tenderness.  No palpable cords. LYMPH NODES: No palpable cervical, supraclavicular, axillary or inguinal adenopathy  NEUROLOGICAL: Unremarkable. PSYCH:  Appropriate.   Appointment on 04/01/2017  Component Date Value Ref Range Status  . WBC 04/01/2017 7.4  3.8 - 10.6 K/uL Final  . RBC 04/01/2017 4.78  4.40 - 5.90 MIL/uL Final  . Hemoglobin 04/01/2017 14.1  13.0 - 18.0 g/dL Final  . HCT 04/01/2017 43.2  40.0 - 52.0 % Final  . MCV 04/01/2017 90.4  80.0 - 100.0 fL Final  . MCH 04/01/2017 29.5  26.0 - 34.0 pg Final  . MCHC 04/01/2017 32.6  32.0 - 36.0 g/dL Final  . RDW 04/01/2017 14.0  11.5 - 14.5 % Final  . Platelets 04/01/2017 283  150 - 440 K/uL Final  . Neutrophils Relative % 04/01/2017  58  % Final  . Neutro Abs 04/01/2017 4.3  1.4 - 6.5 K/uL Final  . Lymphocytes Relative 04/01/2017 25  % Final  . Lymphs Abs 04/01/2017 1.9  1.0 - 3.6 K/uL Final  . Monocytes Relative 04/01/2017 11  % Final  . Monocytes Absolute 04/01/2017 0.9  0.2 - 1.0 K/uL Final  . Eosinophils Relative 04/01/2017 5  % Final  . Eosinophils Absolute 04/01/2017 0.4  0 - 0.7 K/uL Final  . Basophils Relative 04/01/2017 1  % Final  . Basophils Absolute 04/01/2017 0.1  0 - 0.1 K/uL Final  . Sodium 04/01/2017 136  135 - 145 mmol/L Final  . Potassium 04/01/2017 4.1  3.5 - 5.1 mmol/L Final  . Chloride 04/01/2017 102  101 - 111 mmol/L Final  . CO2 04/01/2017 24  22 - 32 mmol/L Final  . Glucose, Bld 04/01/2017 101* 65 - 99 mg/dL Final  . BUN 04/01/2017 11  6 - 20 mg/dL Final  . Creatinine, Ser 04/01/2017 1.08  0.61 - 1.24 mg/dL Final  . Calcium 04/01/2017 9.3  8.9 - 10.3 mg/dL Final  . Total Protein 04/01/2017 7.6  6.5 - 8.1 g/dL Final  . Albumin 04/01/2017 3.6  3.5 - 5.0 g/dL Final  . AST 04/01/2017 25  15 - 41 U/L Final  . ALT 04/01/2017 38  17 - 63 U/L Final  . Alkaline  Phosphatase 04/01/2017 86  38 - 126 U/L Final  . Total Bilirubin 04/01/2017 0.4  0.3 - 1.2 mg/dL Final  . GFR calc non Af Amer 04/01/2017 >60  >60 mL/min Final  . GFR calc Af Amer 04/01/2017 >60  >60 mL/min Final   Comment: (NOTE) The eGFR has been calculated using the CKD EPI equation. This calculation has not been validated in all clinical situations. eGFR's persistently <60 mL/min signify possible Chronic Kidney Disease.   . Anion gap 04/01/2017 10  5 - 15 Final    Assessment:  Justin [name] is a 51 y.o. male with recurrent thrombosis x 4.  He developed a right upper extremity clot in 2010. He underwent thrombolysis.  He was on Coumadin x 6 months.  He developed a recurrent clot in the right upper extremity in 2011.  He underwent thoracic outlet decompression.  He was on Coumadin for 6-9 months.  He developed a right lower extremity DVT in 2016.  He was started on Xarelto.  He developed a left upper extremity DVT on 06/07/2016 while on Xarelto.  Duplex revealed a near occlusive DVT in the lateral aspect of the left subclavian vein and nonocclusive thrombus extending into the adjacent axillary vein.  He was started on Lovenox.  Hypercoagulable work-up on 06/29/2016 revealed the following normal studies:  CBC with diff, Factor V Leiden, prothrombin gene mutation, protein C antigen and activity, protein S antigen and activity, ATIII antigen and activity, anticardiolipin antibodies and beta-2 glycoprotein antibodies.  Lupus anticoagulant panel was positive on Xarelto and Lovenox.  Chest CT angiogram on 07/02/2016 revealed no evidence of pulmonary embolism.  There were small pulmonary nodules, largest 3 mm, of unclear significance.  Bone scan on 08/04/2016 revealed a focus of moderately increased uptake at approximately the T9 right paravertebral region. Chest CT on 07/20/2016 revealed a large right sided osteophyte at the T9-T10 level which was felt to explain the increased right  paravertebral uptake uptake.  There  was also mildly increased uptake in the body of L2. Review of the lumbar spine on the abdominal CT scan of the same  date revealed no definite radiographic abnormality in the body of L2 to explain the mildly increased uptake.  There was no abnormal uptake within the ribs or elsewhere within the skeleton.  Thoracic spine MRI on 09/14/2016 revealed mild degenerative disc disease in the thoracic spine with non-compressive disc bulges at T5-6 and below.  There was no disc herniation or compressive stenosis of the canal or foramina.  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 On the right at T9-10, there was fairly bulky osteophytes emanating from the right costovertebral articulation. There was no evidence of neural compression secondary to this or the other bony degenerative changes.  There was ordinary mild facet osteoarthritis without edematous change or significant hypertrophy.  He underwent right lateral mini-thoracotomy for resection of a bony rib lesion on 01/14/2017.  Notes indicate he underwent thoracic discectomy with decompression by anterior approach.  Pathology revealed an "exophytic lesion".  He has had right sided rib pain post-operatively.  Symptomatically, he has post operative pain in his RIGHT ribs. He is on a since Lovenox injection daily. Exam is unremarkable. Labs unremarkable.   Plan: 1.  Labs today:  CBC with diff, CMP. 2.  Continue Lovenox. Patient to follow up with surgeon about restarting BID dosing.  3.  Continue to follow up with [facility] as scheduled for continued post operative care and evaluations.  4.  RTC in 3 months for MD assess and labs (CBC with diff, CMP).   Honor Loh, NP  04/01/2017, 12:09 PM   I saw and evaluated the patient, participating in the key portions of the service and reviewing pertinent diagnostic studies and records.  I reviewed the nurse  practitioner's note and agree with the findings and the plan.  The assessment and plan were discussed with the patient.  A few questions were asked by the patient and answered.   Nolon Stalls, MD 04/01/2017, 12:09 PM

## 2017-04-01 NOTE — Progress Notes (Signed)
Patient had back surgery on August 10th.  States he is still sore in right ribcage.

## 2017-04-12 DIAGNOSIS — Z9889 Other specified postprocedural states: Secondary | ICD-10-CM | POA: Diagnosis not present

## 2017-04-12 DIAGNOSIS — M5414 Radiculopathy, thoracic region: Secondary | ICD-10-CM | POA: Diagnosis not present

## 2017-04-15 ENCOUNTER — Other Ambulatory Visit: Payer: Self-pay | Admitting: Hematology and Oncology

## 2017-04-15 DIAGNOSIS — R918 Other nonspecific abnormal finding of lung field: Secondary | ICD-10-CM

## 2017-04-15 DIAGNOSIS — M898X1 Other specified disorders of bone, shoulder: Secondary | ICD-10-CM

## 2017-04-15 DIAGNOSIS — R760 Raised antibody titer: Secondary | ICD-10-CM

## 2017-04-15 DIAGNOSIS — I82B12 Acute embolism and thrombosis of left subclavian vein: Secondary | ICD-10-CM

## 2017-04-21 DIAGNOSIS — M5414 Radiculopathy, thoracic region: Secondary | ICD-10-CM | POA: Diagnosis not present

## 2017-04-21 DIAGNOSIS — J9811 Atelectasis: Secondary | ICD-10-CM | POA: Diagnosis not present

## 2017-05-09 DIAGNOSIS — M6281 Muscle weakness (generalized): Secondary | ICD-10-CM | POA: Diagnosis not present

## 2017-05-12 DIAGNOSIS — M6281 Muscle weakness (generalized): Secondary | ICD-10-CM | POA: Diagnosis not present

## 2017-05-19 DIAGNOSIS — M6281 Muscle weakness (generalized): Secondary | ICD-10-CM | POA: Diagnosis not present

## 2017-05-21 DIAGNOSIS — J9811 Atelectasis: Secondary | ICD-10-CM | POA: Diagnosis not present

## 2017-05-21 DIAGNOSIS — M5414 Radiculopathy, thoracic region: Secondary | ICD-10-CM | POA: Diagnosis not present

## 2017-05-23 DIAGNOSIS — M6281 Muscle weakness (generalized): Secondary | ICD-10-CM | POA: Diagnosis not present

## 2017-05-26 ENCOUNTER — Encounter: Payer: Self-pay | Admitting: Family Medicine

## 2017-05-26 ENCOUNTER — Ambulatory Visit: Payer: 59 | Admitting: Family Medicine

## 2017-05-26 VITALS — BP 107/71 | HR 91 | Wt 255.0 lb

## 2017-05-26 DIAGNOSIS — M6281 Muscle weakness (generalized): Secondary | ICD-10-CM | POA: Diagnosis not present

## 2017-05-26 DIAGNOSIS — R0781 Pleurodynia: Secondary | ICD-10-CM | POA: Diagnosis not present

## 2017-05-26 MED ORDER — OXYCODONE HCL 5 MG PO TABS: ORAL_TABLET | ORAL | 0 refills | Status: DC

## 2017-05-26 MED ORDER — GABAPENTIN 300 MG PO CAPS: 300.0000 mg | ORAL_CAPSULE | Freq: Three times a day (TID) | ORAL | 0 refills | Status: DC

## 2017-05-26 NOTE — Progress Notes (Signed)
BP 107/71   Pulse 91   Wt 255 lb (115.7 kg)   SpO2 95%   BMI 35.57 kg/m    Subjective:    Patient ID: Justin [name], male    DOB: 1965/10/08, 51 y.o.   MRN: [id number]  HPI: Justin [name] is a 51 y.o. male  Chief Complaint  Patient presents with  . Chest Pain    Ribs.    Patient presents today for f/u right rib pain following surgery several months ago to remove large bone spur of posterior rib. Last f/u with surgeon was in November, next f/u is Feb 11th. Previous pain seems to have resolved, but still in severe pain at surgical site right lateral ribs. Has been controlling pain modestly with oxycodone 5 mg 2-3 times daily and rest. Is down to only a few of those left and surgeon has told him to refer to PCP for further pain management. States pain is burning, stinging in quality. Was given prognosis of 1-2 years by surgeon for this pain to fully dissipate. Has paperwork with him today to renew his short term disability. Started PT 2 weeks ago which causes temporary flares in his pain as well.   Relevant past medical, surgical, family and social history reviewed and updated as indicated. Interim medical history since our last visit reviewed. Allergies and medications reviewed and updated.  Review of Systems  Constitutional: Negative.   Respiratory: Negative.   Cardiovascular: Negative.   Gastrointestinal: Negative.   Genitourinary: Negative.   Musculoskeletal:       Pain at lateral right side at surgical site  Skin: Negative.   Neurological: Negative.   Psychiatric/Behavioral: Negative.    Per HPI unless specifically indicated above     Objective:    BP 107/71   Pulse 91   Wt 255 lb (115.7 kg)   SpO2 95%   BMI 35.57 kg/m   Wt Readings from Last 3 Encounters:  05/26/17 255 lb (115.7 kg)  04/01/17 250 lb (113.4 kg)  01/27/17 258 lb 3.2 oz (117.1 kg)    Physical Exam  Constitutional: He is oriented to person, place, and time. He appears well-developed and  well-nourished. No distress.  HENT:  Head: Atraumatic.  Eyes: Conjunctivae are normal. Pupils are equal, round, and reactive to light. No scleral icterus.  Neck: Normal range of motion. Neck supple.  Cardiovascular: Normal rate and normal heart sounds.  Pulmonary/Chest: Effort normal and breath sounds normal. No respiratory distress.  Musculoskeletal: Normal range of motion.  Antalgic movements Significantly tender at surgical sites right lateral ribs and about 3-4 inches circumferentially  Lymphadenopathy:    He has no cervical adenopathy.  Neurological: He is alert and oriented to person, place, and time.  Skin:  Surgical sites well healed with no evidence of seroma or infections  Psychiatric: He has a normal mood and affect. His behavior is normal.  Nursing note and vitals reviewed.     Assessment & Plan:   Problem List Items Addressed This Visit    None    Visit Diagnoses    Rib pain on right side    -  Primary   Will give short term supply of oxycodone and start gabapentin in hopes of reducing need for opioid medications. Precautions reviewed at length for oxycodone    Titrate up to TID on gabapentin, will recheck in 2 weeks and if needing long-term opioid therapy will get him in with pain management from there.   Follow up plan:  Return in about 2 weeks (around 06/09/2017) for Rib pain.

## 2017-05-29 NOTE — Patient Instructions (Signed)
Follow-up in 2 weeks

## 2017-06-02 DIAGNOSIS — M6281 Muscle weakness (generalized): Secondary | ICD-10-CM | POA: Diagnosis not present

## 2017-06-09 ENCOUNTER — Ambulatory Visit: Payer: 59 | Admitting: Family Medicine

## 2017-06-09 VITALS — BP 102/69 | HR 84 | Wt 255.0 lb

## 2017-06-09 DIAGNOSIS — R0781 Pleurodynia: Secondary | ICD-10-CM | POA: Diagnosis not present

## 2017-06-09 DIAGNOSIS — M6281 Muscle weakness (generalized): Secondary | ICD-10-CM | POA: Diagnosis not present

## 2017-06-09 MED ORDER — GABAPENTIN 300 MG PO CAPS: 300.0000 mg | ORAL_CAPSULE | Freq: Three times a day (TID) | ORAL | 1 refills | Status: DC

## 2017-06-09 MED ORDER — OXYCODONE HCL 5 MG PO TABS: ORAL_TABLET | ORAL | 0 refills | Status: DC

## 2017-06-09 NOTE — Progress Notes (Signed)
BP 102/69   Pulse 84   Wt 255 lb (115.7 kg)   SpO2 96%   BMI 35.57 kg/m    Subjective:    Patient ID: Justin [name], male    DOB: 05-Apr-1966, 52 y.o.   MRN: [id number]  HPI: Justin [name] is a 52 y.o. male  Chief Complaint  Patient presents with  . Follow-up    Rib    Patient presents today for right rib pain f/u. Started the gabapentin 2 weeks ago in addition to his oxycodone and notices significant benefit with it for his incisional pain that is predicted to be chronic x 1-2 years per surgeon. Taking on average about 3 oxycodone daily, taking gabapentin TID. Has upcoming f/u with surgeon in a few weeks. Still doing regular PT to help regain his strength. Denies side effects with the medication.   Relevant past medical, surgical, family and social history reviewed and updated as indicated. Interim medical history since our last visit reviewed. Allergies and medications reviewed and updated.  Review of Systems  Constitutional: Negative.   HENT: Negative.   Respiratory: Negative.   Gastrointestinal: Negative.   Musculoskeletal: Positive for arthralgias (right rib pain at healed incision).  Neurological: Negative.   Psychiatric/Behavioral: Negative.    Per HPI unless specifically indicated above     Objective:    BP 102/69   Pulse 84   Wt 255 lb (115.7 kg)   SpO2 96%   BMI 35.57 kg/m   Wt Readings from Last 3 Encounters:  06/09/17 255 lb (115.7 kg)  05/26/17 255 lb (115.7 kg)  04/01/17 250 lb (113.4 kg)    Physical Exam  Constitutional: He is oriented to person, place, and time. He appears well-developed and well-nourished. No distress.  HENT:  Head: Atraumatic.  Eyes: Conjunctivae are normal. Pupils are equal, round, and reactive to light. No scleral icterus.  Neck: Normal range of motion. Neck supple.  Cardiovascular: Normal rate and normal heart sounds.  Pulmonary/Chest: Effort normal and breath sounds normal. No respiratory distress. He has no  wheezes.  Musculoskeletal:  Antalgic movements Significant ttp over surgical site right lateral rib  Neurological: He is alert and oriented to person, place, and time.  Skin: Skin is warm and dry.  Psychiatric: He has a normal mood and affect. His behavior is normal.  Nursing note and vitals reviewed.  Results for orders placed or performed in visit on 04/01/17  CBC with Differential/Platelet  Result Value Ref Range   WBC 7.4 3.8 - 10.6 K/uL   RBC 4.78 4.40 - 5.90 MIL/uL   Hemoglobin 14.1 13.0 - 18.0 g/dL   HCT 43.2 40.0 - 52.0 %   MCV 90.4 80.0 - 100.0 fL   MCH 29.5 26.0 - 34.0 pg   MCHC 32.6 32.0 - 36.0 g/dL   RDW 14.0 11.5 - 14.5 %   Platelets 283 150 - 440 K/uL   Neutrophils Relative % 58 %   Neutro Abs 4.3 1.4 - 6.5 K/uL   Lymphocytes Relative 25 %   Lymphs Abs 1.9 1.0 - 3.6 K/uL   Monocytes Relative 11 %   Monocytes Absolute 0.9 0.2 - 1.0 K/uL   Eosinophils Relative 5 %   Eosinophils Absolute 0.4 0 - 0.7 K/uL   Basophils Relative 1 %   Basophils Absolute 0.1 0 - 0.1 K/uL  Comprehensive metabolic panel  Result Value Ref Range   Sodium 136 135 - 145 mmol/L   Potassium 4.1 3.5 - 5.1 mmol/L  Chloride 102 101 - 111 mmol/L   CO2 24 22 - 32 mmol/L   Glucose, Bld 101 (H) 65 - 99 mg/dL   BUN 11 6 - 20 mg/dL   Creatinine, Ser 1.08 0.61 - 1.24 mg/dL   Calcium 9.3 8.9 - 10.3 mg/dL   Total Protein 7.6 6.5 - 8.1 g/dL   Albumin 3.6 3.5 - 5.0 g/dL   AST 25 15 - 41 U/L   ALT 38 17 - 63 U/L   Alkaline Phosphatase 86 38 - 126 U/L   Total Bilirubin 0.4 0.3 - 1.2 mg/dL   GFR calc non Af Amer >60 >60 mL/min   GFR calc Af Amer >60 >60 mL/min   Anion gap 10 5 - 15      Assessment & Plan:   Problem List Items Addressed This Visit    None    Visit Diagnoses    Rib pain on right side    -  Primary    Excellent functional and quality of life benefit with addition of gabapentin, with the goal of tapering significantly off his oxycodone use. Pt aware that he will need to be  managed by pain management after this short supply of oxycodone if it will still be necessary. Wanting to speak with his surgeon first to get his opinion prior to referral. Will work on tapering to only 1-2 tabs daily and increase gabapentin to 4 tabs daily used TID, with the additional tab for nighttime.   Follow up plan: Return in about 3 months (around 09/07/2017) for CPE.

## 2017-06-12 NOTE — Patient Instructions (Signed)
Follow up for CPE 

## 2017-06-13 ENCOUNTER — Other Ambulatory Visit: Payer: Self-pay | Admitting: Hematology and Oncology

## 2017-06-13 DIAGNOSIS — M898X1 Other specified disorders of bone, shoulder: Secondary | ICD-10-CM

## 2017-06-13 DIAGNOSIS — I82B12 Acute embolism and thrombosis of left subclavian vein: Secondary | ICD-10-CM

## 2017-06-13 DIAGNOSIS — R918 Other nonspecific abnormal finding of lung field: Secondary | ICD-10-CM

## 2017-06-13 DIAGNOSIS — R760 Raised antibody titer: Secondary | ICD-10-CM

## 2017-06-16 DIAGNOSIS — M6281 Muscle weakness (generalized): Secondary | ICD-10-CM | POA: Diagnosis not present

## 2017-06-20 DIAGNOSIS — M6281 Muscle weakness (generalized): Secondary | ICD-10-CM | POA: Diagnosis not present

## 2017-06-21 DIAGNOSIS — J9811 Atelectasis: Secondary | ICD-10-CM | POA: Diagnosis not present

## 2017-06-21 DIAGNOSIS — M5414 Radiculopathy, thoracic region: Secondary | ICD-10-CM | POA: Diagnosis not present

## 2017-06-23 DIAGNOSIS — M6281 Muscle weakness (generalized): Secondary | ICD-10-CM | POA: Diagnosis not present

## 2017-06-24 ENCOUNTER — Other Ambulatory Visit: Payer: Self-pay | Admitting: Family Medicine

## 2017-06-24 ENCOUNTER — Other Ambulatory Visit: Payer: Self-pay | Admitting: *Deleted

## 2017-06-24 MED ORDER — GABAPENTIN 300 MG PO CAPS: 300.0000 mg | ORAL_CAPSULE | Freq: Three times a day (TID) | ORAL | 1 refills | Status: DC

## 2017-06-24 NOTE — Telephone Encounter (Signed)
I wasn't sure what to do with this.   There are 2 orders for it.   Sorry if I didn't approve this correctly.   It looks like Dr. Wynetta Emery has approved this already.

## 2017-06-30 DIAGNOSIS — M6281 Muscle weakness (generalized): Secondary | ICD-10-CM | POA: Diagnosis not present

## 2017-07-01 ENCOUNTER — Telehealth: Payer: Self-pay | Admitting: Family Medicine

## 2017-07-01 ENCOUNTER — Inpatient Hospital Stay: Payer: 59 | Attending: Hematology and Oncology | Admitting: Hematology and Oncology

## 2017-07-01 ENCOUNTER — Encounter: Payer: Self-pay | Admitting: Hematology and Oncology

## 2017-07-01 ENCOUNTER — Inpatient Hospital Stay: Payer: 59

## 2017-07-01 VITALS — HR 69 | Temp 96.4°F | Resp 20 | Wt 258.6 lb

## 2017-07-01 DIAGNOSIS — G8918 Other acute postprocedural pain: Secondary | ICD-10-CM | POA: Diagnosis not present

## 2017-07-01 DIAGNOSIS — K219 Gastro-esophageal reflux disease without esophagitis: Secondary | ICD-10-CM | POA: Insufficient documentation

## 2017-07-01 DIAGNOSIS — I82B12 Acute embolism and thrombosis of left subclavian vein: Secondary | ICD-10-CM

## 2017-07-01 DIAGNOSIS — R918 Other nonspecific abnormal finding of lung field: Secondary | ICD-10-CM | POA: Diagnosis not present

## 2017-07-01 DIAGNOSIS — R760 Raised antibody titer: Secondary | ICD-10-CM

## 2017-07-01 DIAGNOSIS — Z9981 Dependence on supplemental oxygen: Secondary | ICD-10-CM | POA: Diagnosis not present

## 2017-07-01 DIAGNOSIS — Z7901 Long term (current) use of anticoagulants: Secondary | ICD-10-CM | POA: Diagnosis not present

## 2017-07-01 DIAGNOSIS — M1990 Unspecified osteoarthritis, unspecified site: Secondary | ICD-10-CM | POA: Insufficient documentation

## 2017-07-01 DIAGNOSIS — Z79899 Other long term (current) drug therapy: Secondary | ICD-10-CM

## 2017-07-01 DIAGNOSIS — Z87891 Personal history of nicotine dependence: Secondary | ICD-10-CM | POA: Insufficient documentation

## 2017-07-01 DIAGNOSIS — Z86718 Personal history of other venous thrombosis and embolism: Secondary | ICD-10-CM

## 2017-07-01 DIAGNOSIS — I82511 Chronic embolism and thrombosis of right femoral vein: Secondary | ICD-10-CM

## 2017-07-01 DIAGNOSIS — R0781 Pleurodynia: Secondary | ICD-10-CM

## 2017-07-01 LAB — CBC WITH DIFFERENTIAL/PLATELET
Basophils Absolute: 0.1 10*3/uL (ref 0–0.1)
Basophils Relative: 1 %
Eosinophils Absolute: 0.2 10*3/uL (ref 0–0.7)
Eosinophils Relative: 3 %
HCT: 39.9 % — ABNORMAL LOW (ref 40.0–52.0)
Hemoglobin: 13.1 g/dL (ref 13.0–18.0)
Lymphocytes Relative: 26 %
Lymphs Abs: 2.4 10*3/uL (ref 1.0–3.6)
MCH: 28.9 pg (ref 26.0–34.0)
MCHC: 32.9 g/dL (ref 32.0–36.0)
MCV: 87.9 fL (ref 80.0–100.0)
Monocytes Absolute: 1 10*3/uL (ref 0.2–1.0)
Monocytes Relative: 11 %
Neutro Abs: 5.5 10*3/uL (ref 1.4–6.5)
Neutrophils Relative %: 59 %
Platelets: 369 10*3/uL (ref 150–440)
RBC: 4.54 MIL/uL (ref 4.40–5.90)
RDW: 14.6 % — ABNORMAL HIGH (ref 11.5–14.5)
WBC: 9.3 10*3/uL (ref 3.8–10.6)

## 2017-07-01 LAB — COMPREHENSIVE METABOLIC PANEL
ALT: 36 U/L (ref 17–63)
AST: 26 U/L (ref 15–41)
Albumin: 3.5 g/dL (ref 3.5–5.0)
Alkaline Phosphatase: 103 U/L (ref 38–126)
Anion gap: 8 (ref 5–15)
BUN: 10 mg/dL (ref 6–20)
CO2: 27 mmol/L (ref 22–32)
Calcium: 9.1 mg/dL (ref 8.9–10.3)
Chloride: 99 mmol/L — ABNORMAL LOW (ref 101–111)
Creatinine, Ser: 1.21 mg/dL (ref 0.61–1.24)
GFR calc Af Amer: >60 mL/min (ref >60)
GFR calc non Af Amer: >60 mL/min (ref >60)
Glucose, Bld: 99 mg/dL (ref 65–99)
Potassium: 4.1 mmol/L (ref 3.5–5.1)
Sodium: 134 mmol/L — ABNORMAL LOW (ref 135–145)
Total Bilirubin: 0.4 mg/dL (ref 0.3–1.2)
Total Protein: 7.8 g/dL (ref 6.5–8.1)

## 2017-07-01 NOTE — Telephone Encounter (Signed)
Called pt and left VM to return call - this is regarding a disability form that the office received for him

## 2017-07-01 NOTE — Telephone Encounter (Signed)
Had called pt a bit ago for a separate reason, will address his pain medication situation upon his return call

## 2017-07-01 NOTE — Progress Notes (Signed)
Patient states he has exertional SOB.  Continues to have pain 8/10 in his right rib cage.

## 2017-07-01 NOTE — Progress Notes (Signed)
[facility] day:  07/01/2017  Chief Complaint: Justin [name] is a 52 y.o. male with recurrent thrombosis who is seen for 3 month assessment on Lovenox.    HPI:  The patient was last seen in the hematology clinic on 04/01/2017.  At that time, he had post operative pain in his RIGHT ribs. He was on Lovenox injections daily. Exam was unremarkable. Labs were unremarkable. He was to discuss return to BID injections with his surgeon.  During the interim, he has had ongoing pain in his ribs since surgery.  His lung discomfort is better.  He can now cough.  He has some discomfort in his abdomen.  He is no longer using a walker.  He still has oxygen at home.   Past Medical History:  Diagnosis Date  . Arm vein blood clot    right  . DVT (deep venous thrombosis) ([place]) 2012; January 2016   History of right arm; now right lower shotty  . GERD (gastroesophageal reflux disease)   . Rib pain on right side 08/04/2016   RT rib pain that radiates around to the back. Level 8 of 10.  Constant. Started 1 mo ago, 1 week after DVT.  Marland Kitchen Vascular thoracic outlet syndrome    Right     Past Surgical History:  Procedure Laterality Date  . CAROTID ANGIOGRAM    . KNEE CARTILAGE SURGERY     Performed for cracked patella and torn meniscus  . Right upper extremity thrombectomy  2012  . SCALENOTOMY W/O RESECTION CERVICAL RIB  2012   4 right-sided thoracic outlet syndrome    Family History  Problem Relation Age of Onset  . Heart disease Father   . Breast cancer Father   . Diabetes Father   . Heart attack Father   . Cancer Maternal Grandmother   . Leukemia Maternal Grandfather     Social History:  reports that he has quit smoking. He smoked 0.50 packs per day. he has never used smokeless tobacco. He reports that he does not drink alcohol or use drugs.  He previously smoked 1/2 pack a day for 10 years.  He stopped smoking in his 54s.  He lives in [place].  He is a  Glass blower/designer.  He denies any exposure to radiation or toxins.  He has a son.  The patient is accompanied by his wife, [name], today.  Allergies: No Known Allergies  Current Medications: Current Outpatient Medications  Medication Sig Dispense Refill  . enoxaparin (LOVENOX) 150 MG/ML injection INJECT 0.74 MLS (110 MG TOTAL) INTO THE SKIN 2 (TWO) TIMES DAILY. 60 Syringe 0  . gabapentin (NEURONTIN) 300 MG capsule Take 1 capsule (300 mg total) by mouth 3 (three) times daily. Can add an additional tab at bedtime to total 4 tabs daily 120 capsule 0  . gabapentin (NEURONTIN) 300 MG capsule Take 1 capsule (300 mg total) by mouth 3 (three) times daily. Take 1 tab TID, can take an extra tab at bedtime 120 capsule 1  . Multiple Vitamin (MULTI VITAMIN DAILY PO) Take 1 tablet by mouth daily.    Marland Kitchen oxyCODONE (OXY IR/ROXICODONE) 5 MG immediate release tablet Take 1-2 tablets every 4 hours as needed for pain 40 tablet 0  . topiramate (TOPAMAX) 50 MG tablet Take 50 mg by mouth daily.     Current Facility-Administered Medications  Medication Dose Route Frequency Provider Last Rate Last Dose  . lidocaine (PF) (XYLOCAINE) 1 % injection 0.3 mL  0.3 mL Other Once Magnus Sinning, MD      . methylPREDNISolone acetate (DEPO-MEDROL) injection 80 mg  80 mg Other Once Magnus Sinning, MD        Review of Systems:  GENERAL:  Feels "ok".  Pain still in ribs.  No fevers or sweats.  Weight up 8 pounds. PERFORMANCE STATUS (ECOG):  1 HEENT:  Visual changes since last year.  No runny nose, sore throat, mouth sores or tenderness. Lungs: Shortness of breath at times.  No longer lung discomfort with cough.  No hemoptysis. Post-op pain continues. Cardiac:  No chest pain, palpitations, orthopnea, or PND. GI:  No nausea, vomiting, diarrhea, constipation, melena or hematochezia. GU:  No urgency, frequency, dysuria, or hematuria. Musculoskeletal:  Right rib pain post op.  Back pain resolved.  No muscle  tenderness. Extremities:  No pain or swelling. Skin:  Bruising and knots where Lovenox injections.  Neuro:  No headache, numbness or weakness, balance or coordination issues. Endocrine:  No diabetes, thyroid issues, hot flashes or night sweats. Psych:  No mood changes, depression or anxiety. Pain:  Rib pain (8 out of 10). Review of systems:  All other systems reviewed and found to be negative.  Physical Exam: Pulse 69, temperature (!) 96.4 F (35.8 C), temperature source Tympanic, resp. rate 20, weight 258 lb 9 oz (117.3 kg). GENERAL:  Well developed, well nourished, muscular gentleman sitting comfortably in the exam room in no acute distress. MENTAL STATUS:  Alert and oriented to person, place and time. HEAD: Wearing a camo [facility] cap. Gray beard.  Normocephalic, atraumatic, face symmetric, no Cushingoid features. EYES:  Glasses.  Brown eyes.  Pupils equal round and reactive to light and accomodation.  No conjunctivitis or scleral icterus. ENT:  Oropharynx clear without lesion.  Tongue normal. Mucous membranes moist.  RESPIRATORY:  Clear to auscultation without rales, wheezes or rhonchi. CARDIOVASCULAR:  Regular rate and rhythm without murmur, rub or gallop. CHEST WALL:  Right lower thoracotomy site unremarkable (where chronic pain localizes). ABDOMEN:  Soft, non-tender, with active bowel sounds, and no hepatosplenomegaly.  No masses. SKIN:  No rashes, ulcers or lesions. EXTREMITIES: Mild right upper extremity edema.  No skin discoloration or tenderness.  No palpable cords. LYMPH NODES: No palpable cervical, supraclavicular, axillary or inguinal adenopathy  NEUROLOGICAL: Unremarkable. PSYCH:  Appropriate.   Appointment on 07/01/2017  Component Date Value Ref Range Status  . WBC 07/01/2017 9.3  3.8 - 10.6 K/uL Final  . RBC 07/01/2017 4.54  4.40 - 5.90 MIL/uL Final  . Hemoglobin 07/01/2017 13.1  13.0 - 18.0 g/dL Final  . HCT 07/01/2017 39.9* 40.0 - 52.0 % Final  . MCV 07/01/2017 87.9   80.0 - 100.0 fL Final  . MCH 07/01/2017 28.9  26.0 - 34.0 pg Final  . MCHC 07/01/2017 32.9  32.0 - 36.0 g/dL Final  . RDW 07/01/2017 14.6* 11.5 - 14.5 % Final  . Platelets 07/01/2017 369  150 - 440 K/uL Final  . Neutrophils Relative % 07/01/2017 59  % Final  . Neutro Abs 07/01/2017 5.5  1.4 - 6.5 K/uL Final  . Lymphocytes Relative 07/01/2017 26  % Final  . Lymphs Abs 07/01/2017 2.4  1.0 - 3.6 K/uL Final  . Monocytes Relative 07/01/2017 11  % Final  . Monocytes Absolute 07/01/2017 1.0  0.2 - 1.0 K/uL Final  . Eosinophils Relative 07/01/2017 3  % Final  . Eosinophils Absolute 07/01/2017 0.2  0 - 0.7 K/uL Final  . Basophils Relative 07/01/2017 1  %  Final  . Basophils Absolute 07/01/2017 0.1  0 - 0.1 K/uL Final   Performed at [facility], [address] [place], [place] [address]  . Sodium 07/01/2017 134* 135 - 145 mmol/L Final  . Potassium 07/01/2017 4.1  3.5 - 5.1 mmol/L Final  . Chloride 07/01/2017 99* 101 - 111 mmol/L Final  . CO2 07/01/2017 27  22 - 32 mmol/L Final  . Glucose, Bld 07/01/2017 99  65 - 99 mg/dL Final  . BUN 07/01/2017 10  6 - 20 mg/dL Final  . Creatinine, Ser 07/01/2017 1.21  0.61 - 1.24 mg/dL Final  . Calcium 07/01/2017 9.1  8.9 - 10.3 mg/dL Final  . Total Protein 07/01/2017 7.8  6.5 - 8.1 g/dL Final  . Albumin 07/01/2017 3.5  3.5 - 5.0 g/dL Final  . AST 07/01/2017 26  15 - 41 U/L Final  . ALT 07/01/2017 36  17 - 63 U/L Final  . Alkaline Phosphatase 07/01/2017 103  38 - 126 U/L Final  . Total Bilirubin 07/01/2017 0.4  0.3 - 1.2 mg/dL Final  . GFR calc non Af Amer 07/01/2017 >60  >60 mL/min Final  . GFR calc Af Amer 07/01/2017 >60  >60 mL/min Final   Comment: (NOTE) The eGFR has been calculated using the CKD EPI equation. This calculation has not been validated in all clinical situations. eGFR's persistently <60 mL/min signify possible Chronic Kidney Disease.   Georgiann Hahn gap 07/01/2017 8  5 - 15 Final   Performed at [facility], [address] [place], [place] [address]    Assessment:  SIAN JOLES is a 52 y.o. male with recurrent thrombosis x 4.  He developed a right upper extremity clot in 2010. He underwent thrombolysis.  He was on Coumadin x 6 months.  He developed a recurrent clot in the right upper extremity in 2011.  He underwent thoracic outlet decompression.  He was on Coumadin for 6-9 months.  He developed a right lower extremity DVT in 2016.  He was started on Xarelto.  He developed a left upper extremity DVT on 06/07/2016 while on Xarelto.  Duplex revealed a near occlusive DVT in the lateral aspect of the left subclavian vein and nonocclusive thrombus extending into the adjacent axillary vein.  He was started on Lovenox.  Hypercoagulable work-up on 06/29/2016 revealed the following normal studies:  CBC with diff, Factor V Leiden, prothrombin gene mutation, protein C antigen and activity, protein S antigen and activity, ATIII antigen and activity, anticardiolipin antibodies and beta-2 glycoprotein antibodies.  Lupus anticoagulant panel was positive on Xarelto and Lovenox.  Chest CT angiogram on 07/02/2016 revealed no evidence of pulmonary embolism.  There were small pulmonary nodules, largest 3 mm, of unclear significance.  Bone scan on 08/04/2016 revealed a focus of moderately increased uptake at approximately the T9 right paravertebral region. Chest CT on 07/20/2016 revealed a large right sided osteophyte at the T9-T10 level which was felt to explain the increased right paravertebral uptake uptake.  There  was also mildly increased uptake in the body of L2. Review of the lumbar spine on the abdominal CT scan of the same date revealed no definite radiographic abnormality in the body of L2 to explain the mildly increased uptake.  There was no abnormal uptake within the ribs or elsewhere within the skeleton.  Thoracic spine MRI on 09/14/2016 revealed mild degenerative disc disease in the thoracic spine with non-compressive disc  bulges at T5-6 and below.  There was no disc herniation  or compressive stenosis of the canal or foramina.  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 On the right at T9-10, there was fairly bulky osteophytes emanating from the right costovertebral articulation. There was no evidence of neural compression secondary to this or the other bony degenerative changes.  There was ordinary mild facet osteoarthritis without edematous change or significant hypertrophy.  He underwent right lateral mini-thoracotomy for resection of a bony rib lesion on 01/14/2017.  Notes indicate he underwent thoracic discectomy with decompression by anterior approach.  Pathology revealed an "exophytic lesion".  He has had right sided rib pain post-operatively.  Symptomatically, he has ongoing post operative pain in his RIGHT ribs. He on Lovenox BID. Exam is unremarkable. Labs are unremarkable.   Plan: 1.  Labs today:  CBC with diff, CMP. 2.  Continue Lovenox BID. 3.  Contact Gillie Manners, PA at [facility] regarding pathology [phone]). 4.  RTC in 3 months for MD assessment and labs (CBC with diff, CMP).   Nolon Stalls, MD 07/01/2017, 5:35 PM

## 2017-07-01 NOTE — Telephone Encounter (Signed)
Copied from [place] [id number]. Topic: Quick Communication - See Telephone Encounter >> Jul 01, 2017 11:19 AM Boyd Kerbs wrote: CRM for notification. See Telephone encounter for:   Patient is asking if can get some pain pills. The ones he was given on 1/3  [facility] [place], [place] [address] [place] [address] Phone: [phone] Fax: [phone]    07/01/17.

## 2017-07-07 DIAGNOSIS — M6281 Muscle weakness (generalized): Secondary | ICD-10-CM | POA: Diagnosis not present

## 2017-07-08 ENCOUNTER — Telehealth: Payer: Self-pay | Admitting: Family Medicine

## 2017-07-08 MED ORDER — OXYCODONE HCL 5 MG PO TABS: ORAL_TABLET | ORAL | 0 refills | Status: DC

## 2017-07-08 NOTE — Telephone Encounter (Signed)
Copied from [place] [id number]. Topic: General - Other >> Jul 08, 2017  1:22 PM Lolita Rieger, [place] wrote: Reason for CRM: Pt called back and stated that he has not heard anything concerning his request for pain medication please contact pt @[phone] 

## 2017-07-08 NOTE — Telephone Encounter (Signed)
Returned pt's call, discussed at length what's going on with his increased pain during PT sessions and pt finally ready to pursue pain management per both our advice as well as Neurosurgeon's advice. Will generate referral, bridging oxycodone until then given his persistent severe pain. [facility]

## 2017-07-08 NOTE — Telephone Encounter (Signed)
Copied from [place] [id number]. Topic: Quick Communication - Rx Refill/Question >> Jul 08, 2017  5:17 PM Justin [name] B wrote: Pharmacy called to state they cant have narcotics faxed to them

## 2017-07-08 NOTE — Telephone Encounter (Signed)
Pt calling again regarding pain medication.

## 2017-07-08 NOTE — Telephone Encounter (Signed)
Called pt, see note on separate encounter

## 2017-07-11 NOTE — Telephone Encounter (Signed)
Left a message for patient to pick the script up at the office.

## 2017-07-11 NOTE — Telephone Encounter (Signed)
No, we cannot. He will need to pick it up.

## 2017-07-12 DIAGNOSIS — M6281 Muscle weakness (generalized): Secondary | ICD-10-CM | POA: Diagnosis not present

## 2017-07-14 DIAGNOSIS — M6281 Muscle weakness (generalized): Secondary | ICD-10-CM | POA: Diagnosis not present

## 2017-07-18 DIAGNOSIS — G588 Other specified mononeuropathies: Secondary | ICD-10-CM | POA: Diagnosis not present

## 2017-07-18 DIAGNOSIS — Z9889 Other specified postprocedural states: Secondary | ICD-10-CM | POA: Diagnosis not present

## 2017-07-19 DIAGNOSIS — M6281 Muscle weakness (generalized): Secondary | ICD-10-CM | POA: Diagnosis not present

## 2017-07-20 ENCOUNTER — Other Ambulatory Visit: Payer: Self-pay | Admitting: Hematology and Oncology

## 2017-07-20 DIAGNOSIS — M898X1 Other specified disorders of bone, shoulder: Secondary | ICD-10-CM

## 2017-07-20 DIAGNOSIS — R760 Raised antibody titer: Secondary | ICD-10-CM

## 2017-07-20 DIAGNOSIS — I82B12 Acute embolism and thrombosis of left subclavian vein: Secondary | ICD-10-CM

## 2017-07-20 DIAGNOSIS — R918 Other nonspecific abnormal finding of lung field: Secondary | ICD-10-CM

## 2017-07-22 DIAGNOSIS — J9811 Atelectasis: Secondary | ICD-10-CM | POA: Diagnosis not present

## 2017-07-22 DIAGNOSIS — M5414 Radiculopathy, thoracic region: Secondary | ICD-10-CM | POA: Diagnosis not present

## 2017-07-25 ENCOUNTER — Telehealth: Payer: Self-pay | Admitting: Family Medicine

## 2017-07-25 NOTE — Telephone Encounter (Signed)
Unfortunately, when I last spoke with pt he was informed that this would be the last script I could give him and he needed to make it last. I am unable to provide more pain medication, he needs to see if his Neurosurgeon will bridge him at this point as it is out of my scope.

## 2017-07-25 NOTE — Telephone Encounter (Signed)
Pt is supposed to be getting in with pain clinic soon - until then, he can cut the oxycodone tabs in half or fourths to make them less strong.

## 2017-07-25 NOTE — Telephone Encounter (Signed)
Copied from [place] [id number]. Topic: General - Other >> Jul 25, 2017  1:43 PM Darl Householder, RMA wrote: Reason for CRM: patient is requesting a call back from [facility] concerning pain medication, pt wants to know if he can take something not as strong as Oxycodone

## 2017-07-25 NOTE — Telephone Encounter (Signed)
Spoke with wife. Patient didn't say something less strong. Wife stated patient was wondering if he could have something to knock the edge off. He doesn't have more Oxycodone and his appointment isn't till 08/04/2017 for injections. Wife stated that patient states tylenol and ibuprofen aren't working.

## 2017-07-26 NOTE — Telephone Encounter (Signed)
Spoke with wife of what Justin [name] noted. She explained patient was able to get a pain management this Thursday yesterday.  Explained that if they had anymore questions or if there was anything else we could help with to please call back. Wife verbalized understanding.

## 2017-07-28 ENCOUNTER — Telehealth: Payer: Self-pay | Admitting: Family Medicine

## 2017-07-28 ENCOUNTER — Other Ambulatory Visit: Payer: Self-pay

## 2017-07-28 ENCOUNTER — Encounter: Payer: Self-pay | Admitting: Nurse Practitioner

## 2017-07-28 ENCOUNTER — Ambulatory Visit: Payer: 59 | Attending: Nurse Practitioner | Admitting: Nurse Practitioner

## 2017-07-28 VITALS — BP 121/81 | HR 84 | Temp 98.2°F | Resp 16 | Ht 71.0 in | Wt 249.0 lb

## 2017-07-28 DIAGNOSIS — Z7901 Long term (current) use of anticoagulants: Secondary | ICD-10-CM | POA: Diagnosis not present

## 2017-07-28 DIAGNOSIS — M5414 Radiculopathy, thoracic region: Secondary | ICD-10-CM | POA: Diagnosis not present

## 2017-07-28 DIAGNOSIS — G8918 Other acute postprocedural pain: Secondary | ICD-10-CM | POA: Diagnosis not present

## 2017-07-28 DIAGNOSIS — Z79891 Long term (current) use of opiate analgesic: Secondary | ICD-10-CM | POA: Insufficient documentation

## 2017-07-28 DIAGNOSIS — M546 Pain in thoracic spine: Secondary | ICD-10-CM | POA: Diagnosis not present

## 2017-07-28 DIAGNOSIS — Z789 Other specified health status: Secondary | ICD-10-CM | POA: Diagnosis not present

## 2017-07-28 DIAGNOSIS — M5442 Lumbago with sciatica, left side: Secondary | ICD-10-CM | POA: Diagnosis not present

## 2017-07-28 DIAGNOSIS — M899 Disorder of bone, unspecified: Secondary | ICD-10-CM | POA: Insufficient documentation

## 2017-07-28 DIAGNOSIS — Z5181 Encounter for therapeutic drug level monitoring: Secondary | ICD-10-CM | POA: Insufficient documentation

## 2017-07-28 DIAGNOSIS — G8929 Other chronic pain: Secondary | ICD-10-CM | POA: Insufficient documentation

## 2017-07-28 DIAGNOSIS — Z87891 Personal history of nicotine dependence: Secondary | ICD-10-CM | POA: Diagnosis not present

## 2017-07-28 DIAGNOSIS — Z86718 Personal history of other venous thrombosis and embolism: Secondary | ICD-10-CM | POA: Diagnosis not present

## 2017-07-28 DIAGNOSIS — R1011 Right upper quadrant pain: Secondary | ICD-10-CM | POA: Diagnosis not present

## 2017-07-28 DIAGNOSIS — K219 Gastro-esophageal reflux disease without esophagitis: Secondary | ICD-10-CM | POA: Diagnosis not present

## 2017-07-28 DIAGNOSIS — Z79899 Other long term (current) drug therapy: Secondary | ICD-10-CM | POA: Diagnosis not present

## 2017-07-28 DIAGNOSIS — G894 Chronic pain syndrome: Secondary | ICD-10-CM | POA: Diagnosis not present

## 2017-07-28 DIAGNOSIS — R0781 Pleurodynia: Secondary | ICD-10-CM | POA: Insufficient documentation

## 2017-07-28 MED ORDER — GABAPENTIN 300 MG PO CAPS: 300.0000 mg | ORAL_CAPSULE | Freq: Three times a day (TID) | ORAL | 1 refills | Status: DC

## 2017-07-28 NOTE — Telephone Encounter (Signed)
Medication refill/90 day supply request for Gabapentin 300mg  Next appt 09/08/17

## 2017-07-28 NOTE — Patient Instructions (Signed)

## 2017-07-28 NOTE — Telephone Encounter (Signed)
Needs to be seen

## 2017-07-28 NOTE — Progress Notes (Signed)
Patient's Name: Justin [name]  MRN: [id number]  Referring Provider: Volney American,*  DOB: 1965-06-14  PCP: Valerie Roys, DO  DOS: 07/28/2017  Note by: Dionisio David NP  Service setting: Ambulatory outpatient  Specialty: Interventional Pain Management  Location: [facility] (AMB) Pain Management Facility    Patient type: New Patient    Primary Reason(s) for Visit: Initial Patient Evaluation CC: Abdominal Pain (right thoracic r/t post surgical pain 01/14/2017)  HPI  Mr. Edelen is a 52 y.o. year old, male patient, who comes today for an initial evaluation. He has Abnormal finding on EKG; Atypical chest pain; Left subclavian vein thrombosis ([place]); Pain of left scapula; Dyspnea; Lupus anticoagulant positive; DVT (deep venous thrombosis) ([place]); Thoracic outlet syndrome; TMJ tenderness, right; Left-sided low back pain with left-sided sciatica; Radiculopathy of thoracic region; Chronic right-sided thoracic back pain (Primary Area of Pain); Thoracic radiculitis (Secondary Area of Pain); Chronic pain syndrome; Long term current use of opiate analgesic; Long term current use of anticoagulant; Pharmacologic therapy; Disorder of skeletal system; Problems influencing health status; Rib pain on right side (Fourth Area of Pain); and Right upper quadrant abdominal pain (Tertiary Area of Pain) on their problem list.. His primarily concern today is the Abdominal Pain (right thoracic r/t post surgical pain 01/14/2017)  Pain Assessment: Location: Right Abdomen Radiating: around to back Onset: More than a month ago Duration: Chronic pain Quality: Aching, Pressure, Discomfort Severity: 8 /10 (self-reported pain score)  Note: Reported level is compatible with observation. Clinically the patient looks like a 2/10 A 2/10 is viewed as "Mild to Moderate" and described as noticeable and distracting. Impossible to hide from other people. More frequent flare-ups. Still possible to adapt and function close to normal.  It can be very annoying and may have occasional stronger flare-ups. With discipline, patients may get used to it and adapt. Information on the proper use of the pain scale provided to the patient today. When using our objective Pain Scale, levels between 6 and 10/10 are said to belong in an emergency room, as it progressively worsens from a 6/10, described as severely limiting, requiring emergency care not usually available at an outpatient pain management facility. At a 6/10 level, communication becomes difficult and requires great effort. Assistance to reach the emergency department may be required. Facial flushing and profuse sweating along with potentially dangerous increases in heart rate and blood pressure will be evident. Effect on ADL: difficult to lay on that side.  Timing: Constant Modifying factors: medications, laying down in a reclining position on his back helps relieve pain.  positioning with pillows  Onset and Duration: Date of onset: august 03/2017 Cause of pain: Surgery Severity: No change since onset, NAS-11 at its worse: 10/10, NAS-11 at its best: 6/10, NAS-11 now: 8/10 and NAS-11 on the average: 7/10 Timing: Not influenced by the time of the day Aggravating Factors: Bending, Climbing, Kneeling, Lifiting, Prolonged standing, Squatting, Stooping , Twisting, Walking and Working Alleviating Factors: Lying down, Medications and Sleeping Associated Problems: Pain that wakes patient up Quality of Pain: Aching, Pressure-like and Throbbing Previous Examinations or Tests: Bone scan, Cutaneous Pain Threshold Testing (CPT), CT scan, MRI scan, Nerve block, X-rays and Orthopedic evaluation Previous Treatments: Facet blocks, Physical Therapy, Stretching exercises and Trigger point injections  The patient comes into the clinics today for the first time for a chronic pain management evaluation. According to the patient's primary area of pain is in his upper back on the right. This pain does  radiate around to the  front abdomen. He is status post thoracic discectomy and mass removal January 14 2017. He admits that he continues to have pain and swelling. He has had nerve blocks which were not effective in the past. He has had physical therapy which he admits was effective.  He has a history of multiple blood clots. He admits that he has had several removed from the right upper extremities and chest .  Today I took the time to provide the patient with information regarding this pain practice. The patient was informed that the practice is divided into two sections: an interventional pain management section, as well as a completely separate and distinct medication management section. I explained that there are procedure days for interventional therapies, and evaluation days for follow-ups and medication management. Because of the amount of documentation required during both, they are kept separated. This means that there is the possibility that he may be scheduled for a procedure on one day, and medication management the next. I have also informed him that because of staffing and facility limitations, this practice will no longer take patients for medication management only. To illustrate the reasons for this, I gave the patient the example of surgeons, and how inappropriate it would be to refer a patient to his care, just to write for the post-surgical antibiotics on a surgery done by a different surgeon.   Because interventional pain management is part of the board-certified specialty for the doctors, the patient was informed that joining this practice means that they are open to any and all interventional therapies. I made it clear that this does not mean that they will be forced to have any procedures done. What this means is that I believe interventional therapies to be essential part of the diagnosis and proper management of chronic pain conditions. Therefore, patients not interested in these  interventional alternatives will be better served under the care of a different practitioner.  The patient was also made aware of my Comprehensive Pain Management Safety Guidelines where by joining this practice, they limit all of their nerve blocks and joint injections to those done by our practice, for as long as we are retained to manage their care. Historic Controlled Substance Pharmacotherapy Review  PMP and historical list of controlled substances: Oxycodone 5 mg, oxycodone/acetaminophen 5/325 mg, Hydromet syrup, trazodone 50 mg, diazepam 2 mg, hydrocodone/ER suspension, Hydromet syrup, hydrocodone/acetaminophen 5/325 mg, Highest opioid analgesic regimen found: Oxycodone 5 mg 3 tablets every 6 hours (fill date 01/27/2017) oxycodone 90 mg per day Most recent opioid analgesic: Oxycodone 5 mg 2 tablets 5 times daily (fill date 07/11/2017) oxycodone 50 mg per day Current opioid analgesics: Oxycodone 5 mg 1-2 tablets 4 times daily (fill date 07/11/2017) oxycodone 50 mg per day Highest recorded MME/day: 135 mg/day MME/day: 75 mg/day Medications: The patient did not bring the medication(s) to the appointment, as requested in our "New Patient Package" Pharmacodynamics: Desired effects: Analgesia: The patient reports >50% benefit. Reported improvement in function: The patient reports medication allows him to accomplish basic ADLs. Clinically meaningful improvement in function (CMIF): Sustained CMIF goals met Perceived effectiveness: Described as relatively effective, allowing for increase in activities of daily living (ADL) Undesirable effects: Side-effects or Adverse reactions: None reported Historical Monitoring: The patient  reports that he does not use drugs. List of all UDS Test(s): No results found for: MDMA, COCAINSCRNUR, PCPSCRNUR, PCPQUANT, CANNABQUANT, THCU, [place] List of all Serum Drug Screening Test(s):  No results found for: AMPHSCRSER, BARBSCRSER, BENZOSCRSER, COCAINSCRSER,  PCPSCRSER, PCPQUANT, THCSCRSER,  CANNABQUANT, OPIATESCRSER, OXYSCRSER, PROPOXSCRSER Historical Background Evaluation: [place] PDMP: Six (6) year initial data search conducted.              Department of public safety, offender search: Editor, commissioning Information) Non-contributory Risk Assessment Profile: Aberrant behavior: None observed or detected today Risk factors for fatal opioid overdose: None identified today Fatal overdose hazard ratio (HR): Calculation deferred Non-fatal overdose hazard ratio (HR): Calculation deferred Risk of opioid abuse or dependence: 0.7-3.0% with doses ? 36 MME/day and 6.1-26% with doses ? 120 MME/day. Substance use disorder (SUD) risk level: Pending results of Medical Psychology Evaluation for SUD Opioid risk tool (ORT) (Total Score): 0  ORT Scoring interpretation table:  Score <3 = Low Risk for SUD  Score between 4-7 = Moderate Risk for SUD  Score >8 = High Risk for Opioid Abuse   PHQ-2 Depression Scale:  Total score:    PHQ-2 Scoring interpretation table: (Score and probability of major depressive disorder)  Score 0 = No depression  Score 1 = 15.4% Probability  Score 2 = 21.1% Probability  Score 3 = 38.4% Probability  Score 4 = 45.5% Probability  Score 5 = 56.4% Probability  Score 6 = 78.6% Probability   PHQ-9 Depression Scale:  Total score:    PHQ-9 Scoring interpretation table:  Score 0-4 = No depression  Score 5-9 = Mild depression  Score 10-14 = Moderate depression  Score 15-19 = Moderately severe depression  Score 20-27 = Severe depression (2.4 times higher risk of SUD and 2.89 times higher risk of overuse)   Pharmacologic Plan: Pending ordered tests and/or consults  Meds  The patient has a current medication list which includes the following prescription(s): enoxaparin, gabapentin, multiple vitamin, oxycodone, and topiramate, and the following Facility-Administered Medications: lidocaine (pf) and methylprednisolone acetate.  Current Outpatient  Medications on File Prior to Visit  Medication Sig  . enoxaparin (LOVENOX) 150 MG/ML injection INJECT 0.74 MLS (110 MG TOTAL) INTO THE SKIN 2 (TWO) TIMES DAILY.  Marland Kitchen gabapentin (NEURONTIN) 300 MG capsule Take 1 capsule (300 mg total) by mouth 3 (three) times daily. Take 1 tab TID, can take an extra tab at bedtime (Patient taking differently: Take 300 mg by mouth 3 (three) times daily. Take 1 tab TID, can take an extra tab at bedtime)  . Multiple Vitamin (MULTI VITAMIN DAILY PO) Take 1 tablet by mouth daily.  Marland Kitchen oxyCODONE (OXY IR/ROXICODONE) 5 MG immediate release tablet Take 1-2 tablets every 4 hours as needed for pain  . topiramate (TOPAMAX) 50 MG tablet Take 50 mg by mouth daily.   Current Facility-Administered Medications on File Prior to Visit  Medication  . lidocaine (PF) (XYLOCAINE) 1 % injection 0.3 mL  . methylPREDNISolone acetate (DEPO-MEDROL) injection 80 mg   Imaging Review    Thoracic Imaging: Thoracic MR wo contrast:  Results for orders placed during the hospital encounter of 09/14/16  MR THORACIC SPINE WO CONTRAST   Narrative CLINICAL DATA:  Right-sided chest and rib pain over the last 6-8 weeks.  EXAM: MRI THORACIC SPINE WITHOUT CONTRAST  TECHNIQUE: Multiplanar, multisequence MR imaging of the thoracic spine was performed. No intravenous contrast was administered.  COMPARISON:  CT chest 08/18/2016  FINDINGS: Alignment:  Normal  Vertebrae: No fracture or primary vertebral body lesion.  Cord:  No cord compression or primary cord lesion.  Paraspinal and other soft tissues: There are some ordinary costovertebral degenerative changes in the mid and lower thoracic spine. This is more pronounced on the right at T9-10 where there  are larger osteophytes associated with the costovertebral articulation. No evidence of edematous or advanced facet arthropathy.  Disc levels:  No advanced degenerative disc disease. There are ordinary mild disc bulges at T5-6 and below. No  focal herniation or compressive stenosis of the canal or foramina.  IMPRESSION: Ordinary mild degenerative disc disease in the thoracic spine with non-compressive disc bulges at T5-6 and below. No disc herniation or compressive stenosis of the canal or foramina.  Costovertebral osteoarthritis in the mid and lower thoracic spine which could be associated with back pain. On the right at T9-10, there are fairly bulky osteophytes emanating from the right costovertebral articulation. There is no evidence of neural compression secondary to this or the other bony degenerative changes.  Ordinary mild facet osteoarthritis without edematous change or significant hypertrophy.   Electronically Signed   By: Nelson Chimes M.D.   On: 09/14/2016 16:12     Note: Available results from prior imaging studies were reviewed.        ROS  Cardiovascular History: Blood thinners:  Anticoagulant Pulmonary or Respiratory History: Lung problems, Shortness of breath and Snoring  Neurological History: No reported neurological signs or symptoms such as seizures, abnormal skin sensations, urinary and/or fecal incontinence, being born with an abnormal open spine and/or a tethered spinal cord Review of Past Neurological Studies:  Results for orders placed or performed during the hospital encounter of 10/02/15  MR Brain W Wo Contrast   Narrative   CLINICAL DATA:  52 year old male with vertigo for 1.5 weeks with no relief on medical therapy. Initial encounter.  EXAM: MRI HEAD WITHOUT AND WITH CONTRAST  TECHNIQUE: Multiplanar, multiecho pulse sequences of the brain and surrounding structures were obtained without and with intravenous contrast.  CONTRAST:  74m MULTIHANCE GADOBENATE DIMEGLUMINE 529 MG/ML IV SOLN  COMPARISON:  Cervical spine CT 05/09/2010. Head CT without contrast 12/03/2006.  FINDINGS: Cerebral volume is normal. No restricted diffusion to suggest acute infarction. No midline shift, mass  effect, evidence of mass lesion, ventriculomegaly, extra-axial collection or acute intracranial hemorrhage. Cervicomedullary junction and pituitary are within normal limits. Major intracranial vascular flow voids are preserved. GPearline Cablesand white matter signal is within normal limits for age; occasional nonspecific cerebral white matter T2 and FLAIR hyperintensity including in the right subcortical white matter on series 8, image 14. No cortical encephalomalacia or chronic cerebral blood products. Deep gray matter nuclei, brainstem, and cerebellum are within normal limits. No abnormal enhancement identified. No dural thickening.  Visible internal auditory structures appear normal. No abnormal IAC enhancement. Mastoids are clear. Stylomastoid foramina appear normal.  Trace paranasal sinus mucosal thickening. Negative orbit and scalp soft tissues. Negative visualized cervical spine. Visualized bone marrow signal is within normal limits.  IMPRESSION: 1.  No acute intracranial abnormality. 2. Normal for age MRI appearance of the brain. 3. No internal auditory abnormality evident, if symptoms persist dedicated thin slice IAC imaging might be valuable.   Electronically Signed   By: HGenevie AnnM.D.   On: 10/02/2015 14:20   Results for orders placed or performed during the hospital encounter of 12/03/06  CT Head Wo Contrast   Narrative   Clinical Data: Seizure.  HEAD CT WITHOUT CONTRAST:  Technique: Contiguous axial images were obtained from the base of the skull through the vertex according to standard protocol without contrast.  Comparison: No prior studies.  Findings: There is no evidence of intracranial hemorrhage, brain edema, acute infarct, mass lesion, or mass effect.  No other intraaxial abnormalities are seen, and  the ventricles are within normal limits.  No abnormal extraaxial fluid collections or masses are identified.  No skull abnormalities are noted.  IMPRESSION:  Negative  non-contrast head CT.  Provider: Robert May   Psychological-Psychiatric History: No reported psychological or psychiatric signs or symptoms such as difficulty sleeping, anxiety, depression, delusions or hallucinations (schizophrenial), mood swings (bipolar disorders) or suicidal ideations or attempts Gastrointestinal History: No reported gastrointestinal signs or symptoms such as vomiting or evacuating blood, reflux, heartburn, alternating episodes of diarrhea and constipation, inflamed or scarred liver, or pancreas or irrregular and/or infrequent bowel movements Genitourinary History: No reported renal or genitourinary signs or symptoms such as difficulty voiding or producing urine, peeing blood, non-functioning kidney, kidney stones, difficulty emptying the bladder, difficulty controlling the flow of urine, or chronic kidney disease Hematological History: No reported hematological signs or symptoms such as prolonged bleeding, low or poor functioning platelets, bruising or bleeding easily, hereditary bleeding problems, low energy levels due to low hemoglobin or being anemic Endocrine History: No reported endocrine signs or symptoms such as high or low blood sugar, rapid heart rate due to high thyroid levels, obesity or weight gain due to slow thyroid or thyroid disease Rheumatologic History: No reported rheumatological signs and symptoms such as fatigue, joint pain, tenderness, swelling, redness, heat, stiffness, decreased range of motion, with or without associated rash Musculoskeletal History: Negative for myasthenia gravis, muscular dystrophy, multiple sclerosis or malignant hyperthermia Work History: Working full time  Allergies  Mr. Dilone has No Known Allergies.  Laboratory Chemistry  Inflammation Markers No results found for: CRP, ESRSEDRATE (CRP: Acute Phase) (ESR: Chronic Phase) Renal Function Markers Lab Results  Component Value Date   BUN 10 07/01/2017   CREATININE 1.21  07/01/2017   GFRAA >60 07/01/2017   GFRNONAA >60 07/01/2017   Hepatic Function Markers Lab Results  Component Value Date   AST 26 07/01/2017   ALT 36 07/01/2017   ALBUMIN 3.5 07/01/2017   ALKPHOS 103 07/01/2017   Electrolytes Lab Results  Component Value Date   NA 134 (L) 07/01/2017   K 4.1 07/01/2017   CL 99 (L) 07/01/2017   CALCIUM 9.1 07/01/2017   Neuropathy Markers No results found for: [id number] Bone Pathology Markers Lab Results  Component Value Date   ALKPHOS 103 07/01/2017   CALCIUM 9.1 07/01/2017   Coagulation Parameters Lab Results  Component Value Date   INR 1.03 07/20/2016   LABPROT 13.5 07/20/2016   APTT 45 (H) 07/20/2016   PLT 369 07/01/2017   Cardiovascular Markers Lab Results  Component Value Date   BNP 18.0 07/20/2016   HGB 13.1 07/01/2017   HCT 39.9 (L) 07/01/2017   Note: Lab results reviewed.  [place]  Drug: Mr. Ramdass  reports that he does not use drugs. Alcohol:  reports that he does not drink alcohol. Tobacco:  reports that he has quit smoking. He smoked 0.50 packs per day. he has never used smokeless tobacco. Medical:  has a past medical history of Arm vein blood clot, DVT (deep venous thrombosis) ([place]) (2012; January 2016), GERD (gastroesophageal reflux disease), Rib pain on right side (08/04/2016), and Vascular thoracic outlet syndrome. Family: family history includes Breast cancer in his father; Cancer in his maternal grandmother; Diabetes in his father; Heart attack in his father; Heart disease in his father; Leukemia in his maternal grandfather.  Past Surgical History:  Procedure Laterality Date  . CAROTID ANGIOGRAM    . KNEE CARTILAGE SURGERY     Performed for cracked patella and torn meniscus  .  Right upper extremity thrombectomy  2012  . SCALENOTOMY W/O RESECTION CERVICAL RIB  2012   4 right-sided thoracic outlet syndrome   Active Ambulatory Problems    Diagnosis Date Noted  . Abnormal finding on EKG 10/04/2014  . Atypical  chest pain 10/04/2014  . Left subclavian vein thrombosis ([place]) 06/29/2016  . Pain of left scapula 07/05/2016  . Dyspnea 07/05/2016  . Lupus anticoagulant positive 07/08/2016  . DVT (deep venous thrombosis) ([place]) 07/08/2016  . Thoracic outlet syndrome 07/08/2016  . TMJ tenderness, right 12/14/2016  . Left-sided low back pain with left-sided sciatica 06/05/2014  . Radiculopathy of thoracic region 10/12/2016  . Chronic right-sided thoracic back pain (Primary Area of Pain) 07/28/2017  . Thoracic radiculitis (Secondary Area of Pain) 07/28/2017  . Chronic pain syndrome 07/28/2017  . Long term current use of opiate analgesic 07/28/2017  . Long term current use of anticoagulant 07/28/2017  . Pharmacologic therapy 07/28/2017  . Disorder of skeletal system 07/28/2017  . Problems influencing health status 07/28/2017  . Rib pain on right side (Fourth Area of Pain) 07/28/2017  . Right upper quadrant abdominal pain (Tertiary Area of Pain) 07/28/2017   Resolved Ambulatory Problems    Diagnosis Date Noted  . No Resolved Ambulatory Problems   Past Medical History:  Diagnosis Date  . Arm vein blood clot   . DVT (deep venous thrombosis) ([place]) 2012; January 2016  . GERD (gastroesophageal reflux disease)   . Rib pain on right side 08/04/2016  . Vascular thoracic outlet syndrome    Constitutional Exam  General appearance: Well nourished, well developed, and well hydrated. In no apparent acute distress Vitals:   07/28/17 1412  BP: 121/81  Pulse: 84  Resp: 16  Temp: 98.2 F (36.8 C)  TempSrc: Oral  SpO2: 97%  Weight: 249 lb (112.9 kg)  Height: 5' 11"  (1.803 m)   BMI Assessment: Estimated body mass index is 34.73 kg/m as calculated from the following:   Height as of this encounter: 5' 11"  (1.803 m).   Weight as of this encounter: 249 lb (112.9 kg).  BMI interpretation table: BMI level Category Range association with higher incidence of chronic pain  <18 kg/m2 Underweight   18.5-24.9  kg/m2 Ideal body weight   25-29.9 kg/m2 Overweight Increased incidence by 20%  30-34.9 kg/m2 Obese (Class I) Increased incidence by 68%  35-39.9 kg/m2 Severe obesity (Class II) Increased incidence by 136%  >40 kg/m2 Extreme obesity (Class III) Increased incidence by 254%   BMI Readings from Last 4 Encounters:  07/28/17 34.73 kg/m  07/01/17 36.06 kg/m  06/09/17 35.57 kg/m  05/26/17 35.57 kg/m   Wt Readings from Last 4 Encounters:  07/28/17 249 lb (112.9 kg)  07/01/17 258 lb 9 oz (117.3 kg)  06/09/17 255 lb (115.7 kg)  05/26/17 255 lb (115.7 kg)  Psych/Mental status: Alert, oriented x 3 (person, place, & time)       Eyes: PERLA Respiratory: No evidence of acute respiratory distress  Cervical Spine Exam  Inspection: No masses, redness, or swelling Alignment: Symmetrical Functional ROM: Unrestricted ROM      Stability: No instability detected Muscle strength & Tone: Functionally intact Sensory: Unimpaired Palpation: No palpable anomalies              Upper Extremity (UE) Exam    Side: Right upper extremity  Side: Left upper extremity  Inspection: No masses, redness, swelling, or asymmetry. No contractures  Inspection: No masses, redness, swelling, or asymmetry. No contractures  Functional ROM: Unrestricted  ROM          Functional ROM: Unrestricted ROM          Muscle strength & Tone: Functionally intact  Muscle strength & Tone: Functionally intact  Sensory: Unimpaired  Sensory: Unimpaired  Palpation: No palpable anomalies              Palpation: No palpable anomalies              Specialized Test(s): Deferred         Specialized Test(s): Deferred          Thoracic Spine Exam  Inspection: Well healed scar from previous spine surgery detected Alignment: Asymmetric Functional ROM: Guarding Stability: No instability detected Sensory: Hyperalgesia (Increased sensitivity to pain) Muscle strength & Tone: Complains of area being tender to palpation  Lumbar Spine Exam   Inspection: No masses, redness, or swelling Alignment: Symmetrical Functional ROM: Unrestricted ROM      Stability: No instability detected Muscle strength & Tone: Functionally intact Sensory: Unimpaired Palpation: Complains of area being tender to palpation       Provocative Tests: Lumbar Hyperextension and rotation test: evaluation deferred today       Patrick's Maneuver: evaluation deferred today                    Gait & Posture Assessment  Ambulation: Unassisted Gait: Relatively normal for age and body habitus Posture: WNL   Lower Extremity Exam    Side: Right lower extremity  Side: Left lower extremity  Inspection: No masses, redness, swelling, or asymmetry. No contractures  Inspection: No masses, redness, swelling, or asymmetry. No contractures  Functional ROM: Unrestricted ROM          Functional ROM: Unrestricted ROM          Muscle strength & Tone: Functionally intact  Muscle strength & Tone: Functionally intact  Sensory: Unimpaired  Sensory: Unimpaired  Palpation: No palpable anomalies  Palpation: No palpable anomalies   Assessment  Primary Diagnosis & Pertinent Problem List: The primary encounter diagnosis was Chronic right-sided thoracic back pain (Primary Area of Pain). Diagnoses of Thoracic radiculitis (Secondary Area of Pain), Right upper quadrant abdominal pain, Rib pain on right side, Chronic pain syndrome, Long term current use of opiate analgesic, Long term current use of anticoagulant, Pharmacologic therapy, Disorder of skeletal system, and Problems influencing health status were also pertinent to this visit.  Visit Diagnosis: 1. Chronic right-sided thoracic back pain (Primary Area of Pain)   2. Thoracic radiculitis (Secondary Area of Pain)   3. Right upper quadrant abdominal pain   4. Rib pain on right side   5. Chronic pain syndrome   6. Long term current use of opiate analgesic   7. Long term current use of anticoagulant   8. Pharmacologic therapy   9.  Disorder of skeletal system   10. Problems influencing health status    Plan of Care  Initial treatment plan:  Please be advised that as per protocol, today's visit has been an evaluation only. We have not taken over the patient's controlled substance management.  Problem-specific plan: No problem-specific Assessment & Plan notes found for this encounter.  Ordered Lab-work, Procedure(s), Referral(s), & Consult(s): Orders Placed This Encounter  Procedures  . Compliance Drug Analysis, Ur  . Comp. Metabolic Panel (12)  . Magnesium  . Vitamin B12  . Sedimentation rate  . 25-Hydroxyvitamin D Lcms D2+D3  . C-reactive protein  . Ambulatory referral to Psychology   Pharmacotherapy: Medications  ordered:  No orders of the defined types were placed in this encounter.  Medications administered during this visit: [name] had no medications administered during this visit.   Pharmacotherapy under consideration:  Opioid Analgesics: The patient was informed that there is no guarantee that he would be a candidate for opioid analgesics. The decision will be made following CDC guidelines. This decision will be based on the results of diagnostic studies, as well as Mr. Depascale risk profile.  Membrane stabilizer: To be determined at a later time Muscle relaxant: To be determined at a later time NSAID: To be determined at a later time Other analgesic(s): To be determined at a later time   Interventional therapies under consideration: Mr. Force was informed that there is no guarantee that he would be a candidate for interventional therapies. The decision will be based on the results of diagnostic studies, as well as Mr. Fanguy risk profile.  Possible procedure(s): Diagnostic right-sided thoracic nerve block Possible right-sided thoracic medial nerve RFA Diagnostic right-sided intercostal nerve block  Trigger point injection   Provider-requested follow-up: Return for 2nd Visit,  w/ Dr. Dossie Arbour, after [facility] eval.  Future Appointments  Date Time Provider [place]  09/08/2017  1:00 PM Volney American, [place] [facility]  09/29/2017 10:15 AM [facility] None  09/29/2017 10:45 AM Lequita Asal, MD [facility] None    Primary Care Physician: Valerie Roys, DO Location: [facility] Note by:  Date: 07/28/2017; Time: 3:30 PM  Pain Score Disclaimer: We use the NRS-11 scale. This is a self-reported, subjective measurement of pain severity with only modest accuracy. It is used primarily to identify changes within a particular patient. It must be understood that outpatient pain scales are significantly less accurate that those used for research, where they can be applied under ideal controlled circumstances with minimal exposure to variables. In reality, the score is likely to be a combination of pain intensity and pain affect, where pain affect describes the degree of emotional arousal or changes in action readiness caused by the sensory experience of pain. Factors such as social and work situation, setting, emotional state, anxiety levels, expectation, and prior pain experience may influence pain perception and show large inter-individual differences that may also be affected by time variables.  Patient instructions provided during this appointment: Patient Instructions   ____________________________________________________________________________________________  Appointment Policy Summary  It is our goal and responsibility to provide the medical community with assistance in the evaluation and management of patients with chronic pain. Unfortunately our resources are limited. Because we do not have an unlimited amount of time, or available appointments, we are required to closely monitor and manage their use. The following rules exist to maximize their use:  Patient's responsibilities: 1. Punctuality:  At what  time should I arrive? You should be physically present in our office 30 minutes before your scheduled appointment. Your scheduled appointment is with your assigned healthcare provider. However, it takes 5-10 minutes to be "checked-in", and another 15 minutes for the nurses to do the admission. If you arrive to our office at the time you were given for your appointment, you will end up being at least 20-25 minutes late to your appointment with the provider. 2. Tardiness:  What happens if I arrive only a few minutes after my scheduled appointment time? You will need to reschedule your appointment. The cutoff is your appointment time. This is why it is so important that you arrive at least 30 minutes  before that appointment. If you have an appointment scheduled for 10:00 AM and you arrive at 10:01, you will be required to reschedule your appointment.  3. Plan ahead:  Always assume that you will encounter traffic on your way in. Plan for it. If you are dependent on a driver, make sure they understand these rules and the need to arrive early. 4. Other appointments and responsibilities:  Avoid scheduling any other appointments before or after your pain clinic appointments.  5. Be prepared:  Write down everything that you need to discuss with your healthcare provider and give this information to the admitting nurse. Write down the medications that you will need refilled. Bring your pills and bottles (even the empty ones), to all of your appointments, except for those where a procedure is scheduled. 6. No children or pets:  Find someone to take care of them. It is not appropriate to bring them in. 7. Scheduling changes:  We request "advanced notification" of any changes or cancellations. 8. Advanced notification:  Defined as a time period of more than 24 hours prior to the originally scheduled appointment. This allows for the appointment to be offered to other patients. 9. Rescheduling:  When a visit is  rescheduled, it will require the cancellation of the original appointment. For this reason they both fall within the category of "Cancellations".  10. Cancellations:  They require advanced notification. Any cancellation less than 24 hours before the  appointment will be recorded as a "No Show". 11. No Show:  Defined as an unkept appointment where the patient failed to notify or declare to the practice their intention or inability to keep the appointment.  Corrective process for repeat offenders:  1. Tardiness: Three (3) episodes of rescheduling due to late arrivals will be recorded as one (1) "No Show". 2. Cancellation or reschedule: Three (3) cancellations or rescheduling will be recorded as one (1) "No Show". 3. "No Shows": Three (3) "No Shows" within a 12 month period will result in discharge from the practice. ____________________________________________________________________________________________  ____________________________________________________________________________________________  Pain Scale  Introduction: The pain score used by this practice is the Verbal Numerical Rating Scale (VNRS-11). This is an 11-point scale. It is for adults and children 10 years or older. There are significant differences in how the pain score is reported, used, and applied. Forget everything you learned in the past and learn this scoring system.  General Information: The scale should reflect your current level of pain. Unless you are specifically asked for the level of your worst pain, or your average pain. If you are asked for one of these two, then it should be understood that it is over the past 24 hours.  Basic Activities of Daily Living (ADL): Personal hygiene, dressing, eating, transferring, and using restroom.  Instructions: Most patients tend to report their level of pain as a combination of two factors, their physical pain and their psychosocial pain. This last one is also known as  "suffering" and it is reflection of how physical pain affects you socially and psychologically. From now on, report them separately. From this point on, when asked to report your pain level, report only your physical pain. Use the following table for reference.  Pain Clinic Pain Levels (0-5/10)  Pain Level Score  Description  No Pain 0   Mild pain 1 Nagging, annoying, but does not interfere with basic activities of daily living (ADL). Patients are able to eat, bathe, get dressed, toileting (being able to get on and off the toilet  and perform personal hygiene functions), transfer (move in and out of bed or a chair without assistance), and maintain continence (able to control bladder and bowel functions). Blood pressure and heart rate are unaffected. A normal heart rate for a healthy adult ranges from 60 to 100 bpm (beats per minute).   Mild to moderate pain 2 Noticeable and distracting. Impossible to hide from other people. More frequent flare-ups. Still possible to adapt and function close to normal. It can be very annoying and may have occasional stronger flare-ups. With discipline, patients may get used to it and adapt.   Moderate pain 3 Interferes significantly with activities of daily living (ADL). It becomes difficult to feed, bathe, get dressed, get on and off the toilet or to perform personal hygiene functions. Difficult to get in and out of bed or a chair without assistance. Very distracting. With effort, it can be ignored when deeply involved in activities.   Moderately severe pain 4 Impossible to ignore for more than a few minutes. With effort, patients may still be able to manage work or participate in some social activities. Very difficult to concentrate. Signs of autonomic nervous system discharge are evident: dilated pupils (mydriasis); mild sweating (diaphoresis); sleep interference. Heart rate becomes elevated (>115 bpm). Diastolic blood pressure (lower number) rises above 100 mmHg.  Patients find relief in laying down and not moving.   Severe pain 5 Intense and extremely unpleasant. Associated with frowning face and frequent crying. Pain overwhelms the senses.  Ability to do any activity or maintain social relationships becomes significantly limited. Conversation becomes difficult. Pacing back and forth is common, as getting into a comfortable position is nearly impossible. Pain wakes you up from deep sleep. Physical signs will be obvious: pupillary dilation; increased sweating; goosebumps; brisk reflexes; cold, clammy hands and feet; nausea, vomiting or dry heaves; loss of appetite; significant sleep disturbance with inability to fall asleep or to remain asleep. When persistent, significant weight loss is observed due to the complete loss of appetite and sleep deprivation.  Blood pressure and heart rate becomes significantly elevated. Caution: If elevated blood pressure triggers a pounding headache associated with blurred vision, then the patient should immediately seek attention at an urgent or emergency care unit, as these may be signs of an impending stroke.    Emergency Department Pain Levels (6-10/10)  Emergency Room Pain 6 Severely limiting. Requires emergency care and should not be seen or managed at an outpatient pain management facility. Communication becomes difficult and requires great effort. Assistance to reach the emergency department may be required. Facial flushing and profuse sweating along with potentially dangerous increases in heart rate and blood pressure will be evident.   Distressing pain 7 Self-care is very difficult. Assistance is required to transport, or use restroom. Assistance to reach the emergency department will be required. Tasks requiring coordination, such as bathing and getting dressed become very difficult.   Disabling pain 8 Self-care is no longer possible. At this level, pain is disabling. The individual is unable to do even the most "basic"  activities such as walking, eating, bathing, dressing, transferring to a bed, or toileting. Fine motor skills are lost. It is difficult to think clearly.   Incapacitating pain 9 Pain becomes incapacitating. Thought processing is no longer possible. Difficult to remember your own name. Control of movement and coordination are lost.   The worst pain imaginable 10 At this level, most patients pass out from pain. When this level is reached, collapse of the autonomic nervous  system occurs, leading to a sudden drop in blood pressure and heart rate. This in turn results in a temporary and dramatic drop in blood flow to the brain, leading to a loss of consciousness. Fainting is one of the body's self defense mechanisms. Passing out puts the brain in a calmed state and causes it to shut down for a while, in order to begin the healing process.    Summary: 1. Refer to this scale when providing [place] with your pain level. 2. Be accurate and careful when reporting your pain level. This will help with your care. 3. Over-reporting your pain level will lead to loss of credibility. 4. Even a level of 1/10 means that there is pain and will be treated at our facility. 5. High, inaccurate reporting will be documented as "Symptom Exaggeration", leading to loss of credibility and suspicions of possible secondary gains such as obtaining more narcotics, or wanting to appear disabled, for fraudulent reasons. 6. Only pain levels of 5 or below will be seen at our facility. 7. Pain levels of 6 and above will be sent to the Emergency Department and the appointment cancelled. ____________________________________________________________________________________________

## 2017-07-28 NOTE — Telephone Encounter (Unsigned)
Copied from [place] [phone]. Topic: Quick Communication - See Telephone Encounter >> Jul 28, 2017  3:25 PM Percell Belt A wrote: CRM for notification. See Telephone encounter for:  pt just left pain clinic and he stated that dr faxed over a message to Dr Wynetta Emery to see if she could provide pt with one month of oxycodone   Best number [phone]   07/28/17.

## 2017-07-28 NOTE — Progress Notes (Signed)
Safety precautions to be maintained throughout the outpatient stay will include: orient to surroundings, keep bed in low position, maintain call bell within reach at all times, provide assistance with transfer out of bed and ambulation.  

## 2017-07-28 NOTE — Telephone Encounter (Signed)
Please get patient scheduled.  °

## 2017-07-29 ENCOUNTER — Encounter: Payer: Self-pay | Admitting: Family Medicine

## 2017-07-29 ENCOUNTER — Ambulatory Visit (INDEPENDENT_AMBULATORY_CARE_PROVIDER_SITE_OTHER): Payer: 59 | Admitting: Family Medicine

## 2017-07-29 VITALS — BP 129/81 | HR 71 | Temp 98.6°F | Wt 260.5 lb

## 2017-07-29 DIAGNOSIS — G894 Chronic pain syndrome: Secondary | ICD-10-CM | POA: Diagnosis not present

## 2017-07-29 MED ORDER — OXYCODONE HCL 5 MG PO TABS: ORAL_TABLET | ORAL | 0 refills | Status: DC

## 2017-07-29 NOTE — Assessment & Plan Note (Signed)
We are aware of the situation with Justin [name]'s pain medicine and that he is out. PMP reviewed today and appropriate. Going for injections on 08/04/17. We discussed today, that this will be the last Rx that he will get for his chronic pain from this office unless we are weaning him off. We will give him a month's supply. If he is not accepted by the pain clinic for medication management, we can wean him off the medicine or we can refer him elsewhere. He is aware. Call with any concerns.

## 2017-07-29 NOTE — Progress Notes (Signed)
BP 129/81 (BP Location: Left Arm, Patient Position: Sitting, Cuff Size: Large)   Pulse 71   Temp 98.6 F (37 C)   Wt 260 lb 8 oz (118.2 kg)   SpO2 98%   BMI 36.33 kg/m    Subjective:    Patient ID: Justin [name], male    DOB: [birth year], 52 y.o.   MRN: [id number]  HPI: Justin [name] is a 52 y.o. male  Chief Complaint  Patient presents with  . Pain   Justin [name] presents today to see if we will continue his pain medicine for 1 month. He established with pan management yesterday (at [facility]), but they do not prescribe any medication on the first visit and he will need to qualify for pain management with them before they decide to take over his pain management. Their note was reviewed in detail today. He will need to see pain psychology and then follow up with them. Reviewed notes from Merrie Roof, PA-C, who has been providing his pain management until he gets in to see pain management, she last gave him a script 07/08/17 and discussed with patient and his wife that they would need to make that medicine last as it was the last Rx he was getting from her.  CHRONIC PAIN  Present dose: 75 Morphine equivalents Pain control status: stable Duration: chronic Location: R rib  Quality: sharp Current Pain Level: 8/10 Previous Pain Level: 7/10 Breakthrough pain: yes Benefit from narcotic medications: yes What Activities task can be accomplished with current medication? Able to do ADLs, able to function  Interested in weaning off narcotics:no   Stool softners/OTC fiber: no  Previous pain specialty evaluation: yes- has had physiatry evaluation and just saw pain clinic yesterday Non-narcotic analgesic meds: yes Narcotic contract: yes  Relevant past medical, surgical, family and social history reviewed and updated as indicated. Interim medical history since our last visit reviewed. Allergies and medications reviewed and updated.  Review of Systems  Constitutional: Negative.   Respiratory:  Negative.   Musculoskeletal: Positive for arthralgias, back pain and myalgias. Negative for gait problem, joint swelling, neck pain and neck stiffness.  Psychiatric/Behavioral: Negative.     Per HPI unless specifically indicated above     Objective:    BP 129/81 (BP Location: Left Arm, Patient Position: Sitting, Cuff Size: Large)   Pulse 71   Temp 98.6 F (37 C)   Wt 260 lb 8 oz (118.2 kg)   SpO2 98%   BMI 36.33 kg/m   Wt Readings from Last 3 Encounters:  07/29/17 260 lb 8 oz (118.2 kg)  07/28/17 249 lb (112.9 kg)  07/01/17 258 lb 9 oz (117.3 kg)    Physical Exam  Constitutional: He is oriented to person, place, and time. He appears well-developed and well-nourished. No distress.  HENT:  Head: Normocephalic and atraumatic.  Right Ear: Hearing normal.  Left Ear: Hearing normal.  Nose: Nose normal.  Eyes: Conjunctivae and lids are normal. Right eye exhibits no discharge. Left eye exhibits no discharge. No scleral icterus.  Cardiovascular: Normal rate, regular rhythm, normal heart sounds and intact distal pulses. Exam reveals no gallop and no friction rub.  No murmur heard. Pulmonary/Chest: Effort normal and breath sounds normal. No respiratory distress. He has no wheezes. He has no rales. He exhibits no tenderness.  Musculoskeletal: Normal range of motion. He exhibits tenderness.  Neurological: He is alert and oriented to person, place, and time.  Skin: Skin is warm, dry and intact. No rash noted.  He is not diaphoretic. No erythema. No pallor.  Psychiatric: He has a normal mood and affect. His speech is normal and behavior is normal. Judgment and thought content normal. Cognition and memory are normal.    Results for orders placed or performed in visit on 07/28/17  Comp. Metabolic Panel (12)  Result Value Ref Range   Glucose 102 (H) 65 - 99 mg/dL   BUN 8 6 - 24 mg/dL   Creatinine, Ser 1.29 (H) 0.76 - 1.27 mg/dL   GFR calc non Af Amer 63 >59 mL/min/1.73   GFR calc Af Amer  73 >59 mL/min/1.73   BUN/Creatinine Ratio 6 (L) 9 - 20   Sodium 140 134 - 144 mmol/L   Potassium 4.4 3.5 - 5.2 mmol/L   Chloride 103 96 - 106 mmol/L   Calcium 9.6 8.7 - 10.2 mg/dL   Total Protein 8.1 6.0 - 8.5 g/dL   Albumin 4.1 3.5 - 5.5 g/dL   Globulin, Total 4.0 1.5 - 4.5 g/dL   Albumin/Globulin Ratio 1.0 (L) 1.2 - 2.2   Bilirubin Total 0.2 0.0 - 1.2 mg/dL   Alkaline Phosphatase 102 39 - 117 IU/L   AST 43 (H) 0 - 40 IU/L  Magnesium  Result Value Ref Range   Magnesium 2.0 1.6 - 2.3 mg/dL  Vitamin B12  Result Value Ref Range   Vitamin B-12 391 232 - 1,245 pg/mL  Sedimentation rate  Result Value Ref Range   Sed Rate 40 (H) 0 - 30 mm/hr  25-Hydroxyvitamin D Lcms D2+D3  Result Value Ref Range   25-Hydroxy, Vitamin D WILL FOLLOW    25-Hydroxy, Vitamin D-2 WILL FOLLOW    25-Hydroxy, Vitamin D-3 WILL FOLLOW   C-reactive protein  Result Value Ref Range   CRP 11.4 (H) 0.0 - 4.9 mg/L      Assessment & Plan:   Problem List Items Addressed This Visit      Other   Chronic pain syndrome - Primary (Chronic)    We are aware of the situation with Whitney's pain medicine and that he is out. PMP reviewed today and appropriate. Going for injections on 08/04/17. We discussed today, that this will be the last Rx that he will get for his chronic pain from this office unless we are weaning him off. We will give him a month's supply. If he is not accepted by the pain clinic for medication management, we can wean him off the medicine or we can refer him elsewhere. He is aware. Call with any concerns.           Follow up plan: Return if symptoms worsen or fail to improve.

## 2017-08-01 ENCOUNTER — Encounter: Payer: Self-pay | Admitting: Nurse Practitioner

## 2017-08-01 ENCOUNTER — Other Ambulatory Visit: Payer: Self-pay | Admitting: Nurse Practitioner

## 2017-08-01 DIAGNOSIS — R700 Elevated erythrocyte sedimentation rate: Secondary | ICD-10-CM

## 2017-08-01 DIAGNOSIS — R7982 Elevated C-reactive protein (CRP): Secondary | ICD-10-CM | POA: Insufficient documentation

## 2017-08-03 LAB — COMPLIANCE DRUG ANALYSIS, UR

## 2017-08-04 DIAGNOSIS — G588 Other specified mononeuropathies: Secondary | ICD-10-CM | POA: Diagnosis not present

## 2017-08-04 LAB — 25-HYDROXY VITAMIN D LCMS D2+D3
25-Hydroxy, Vitamin D-2: 3.2 ng/mL
25-Hydroxy, Vitamin D-3: 6.8 ng/mL
25-Hydroxy, Vitamin D: 10 ng/mL — ABNORMAL LOW

## 2017-08-04 LAB — COMP. METABOLIC PANEL (12)
AST: 43 IU/L — ABNORMAL HIGH (ref 0–40)
Albumin/Globulin Ratio: 1 — ABNORMAL LOW (ref 1.2–2.2)
Albumin: 4.1 g/dL (ref 3.5–5.5)
Alkaline Phosphatase: 102 IU/L (ref 39–117)
BUN/Creatinine Ratio: 6 — ABNORMAL LOW (ref 9–20)
BUN: 8 mg/dL (ref 6–24)
Bilirubin Total: 0.2 mg/dL (ref 0.0–1.2)
Calcium: 9.6 mg/dL (ref 8.7–10.2)
Chloride: 103 mmol/L (ref 96–106)
Creatinine, Ser: 1.29 mg/dL — ABNORMAL HIGH (ref 0.76–1.27)
GFR calc Af Amer: 73 mL/min/{1.73_m2} (ref >59)
GFR calc non Af Amer: 63 mL/min/{1.73_m2} (ref >59)
Globulin, Total: 4 g/dL (ref 1.5–4.5)
Glucose: 102 mg/dL — ABNORMAL HIGH (ref 65–99)
Potassium: 4.4 mmol/L (ref 3.5–5.2)
Sodium: 140 mmol/L (ref 134–144)
Total Protein: 8.1 g/dL (ref 6.0–8.5)

## 2017-08-04 LAB — MAGNESIUM: Magnesium: 2 mg/dL (ref 1.6–2.3)

## 2017-08-04 LAB — SEDIMENTATION RATE: Sed Rate: 40 mm/hr — ABNORMAL HIGH (ref 0–30)

## 2017-08-04 LAB — VITAMIN B12: Vitamin B-12: 391 pg/mL (ref 232–1245)

## 2017-08-04 LAB — C-REACTIVE PROTEIN: CRP: 11.4 mg/L — ABNORMAL HIGH (ref 0.0–4.9)

## 2017-08-19 DIAGNOSIS — J9811 Atelectasis: Secondary | ICD-10-CM | POA: Diagnosis not present

## 2017-08-19 DIAGNOSIS — M5414 Radiculopathy, thoracic region: Secondary | ICD-10-CM | POA: Diagnosis not present

## 2017-08-21 NOTE — Progress Notes (Signed)
Patient's Name: Justin [name]  MRN: [id number]  Referring Provider: Valerie Roys, DO  DOB: 07-Oct-1965  PCP: Valerie Roys, DO  DOS: 08/22/2017  Note by: Gaspar Cola, MD  Service setting: Ambulatory outpatient  Specialty: Interventional Pain Management  Location: [facility] (AMB) Pain Management Facility    Patient type: Established   Primary Reason(s) for Visit: Encounter for evaluation before starting new chronic pain management plan of care (Level of risk: moderate) CC: Back Pain (right and mid)  HPI  Justin [name] is a 52 y.o. year old, male patient, who comes today for a follow-up evaluation to review the test results and decide on a treatment plan. He has Abnormal finding on EKG; Atypical chest pain; Left subclavian vein thrombosis ([place]); Pain of left scapula; Dyspnea; Lupus anticoagulant positive; DVT (deep venous thrombosis) ([place]); Thoracic outlet syndrome; TMJ tenderness, right; Left-sided low back pain with left-sided sciatica; Radiculopathy of thoracic region; Chronic right-sided thoracic back pain (Primary Area of Pain); Thoracic radiculitis (Secondary Area of Pain); Chronic pain syndrome; Long term current use of opiate analgesic; Long term current use of anticoagulant (Lovenox); Pharmacologic therapy; Disorder of skeletal system; Problems influencing health status; Rib pain on right side (Fourth Area of Pain); Right upper quadrant abdominal pain (Tertiary Area of Pain); Elevated C-reactive protein (CRP); Elevated sed rate; Vitamin D deficiency; 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 His primarily concern today is the Back Pain (right and mid)  Pain Assessment: Location: Mid Back Onset: More than a month ago Duration: Chronic pain Quality: Aching, Squeezing, Constant Severity: 8 /10 (self-reported pain score)  Note: Reported level is inconsistent with clinical observations. Clinically the  patient looks like a 2/10 A 2/10 is viewed as "Mild to Moderate" and described as noticeable and distracting. Impossible to hide from other people. More frequent flare-ups. Still possible to adapt and function close to normal. It can be very annoying and may have occasional stronger flare-ups. With discipline, patients may get used to it and adapt. Information on the proper use of the pain scale provided to the patient today. When using our objective Pain Scale, levels between 6 and 10/10 are said to belong in an emergency room, as it progressively worsens from a 6/10, described as severely limiting, requiring emergency care not usually available at an outpatient pain management facility. At a 6/10 level, communication becomes difficult and requires great effort. Assistance to reach the emergency department may be required. Facial flushing and profuse sweating along with potentially dangerous increases in heart rate and blood pressure will be evident. Timing: Constant Modifying factors: medications, 45 degree angle sitting or lynig down   Justin [name] comes in today for a follow-up visit after his initial evaluation on 07/28/2017. Today we went over the results of his tests. These were explained in "Layman's terms". During today's appointment we went over my diagnostic impression, as well as the proposed treatment plan.  According to the patient's primary area of pain is in his upper back on the right. This pain does radiate around to the front abdomen, on the right. He is status post thoracic discectomy and mass removal January 14 2017. He admits that he continues to have pain and swelling. He has had nerve blocks which were not effective in the past. Done at [facility], Thursday before last. Done by a Pain Specialist in [place] here by PCP, but to another Pain Doctor (Scheduled to return on 08/31/17,  for post-procedure F/U), by Orthopedic Surgeon. Procedure done under fluoro, he describes  getting 4 needles for the "nerve block". He has had physical therapy which he admits was effective.   He has a history of multiple blood clots. He admits that he has had several removed from the right upper extremities and chest .  See Dr. Lauraine Rinne (Male oncologist). Undergoing workup for "Lupus".   In considering the treatment plan options, Justin [name] was reminded that I no longer take patients for medication management only. I asked him to let me know if he had no intention of taking advantage of the interventional therapies, so that we could make arrangements to provide this space to someone interested. I also made it clear that undergoing interventional therapies for the purpose of getting pain medications is very inappropriate on the part of a patient, and it will not be tolerated in this practice. This type of behavior would suggest true addiction and therefore it requires referral to an addiction specialist.   Further details on both, my assessment(s), as well as the proposed treatment plan, please see below.  Controlled Substance Pharmacotherapy Assessment REMS (Risk Evaluation and Mitigation Strategy)  Analgesic: Oxycodone IR 5 mg 1 tablets 3 times daily (oxycodone 15 mg/day) (Since surgery on August 2018. Removal of benign tumor attached to rib, on right side.)  (Pain from that is gone since surgery.) Highest recorded MME/day: 135 mg/day MME/day: 22.5 mg/day Pill Count: None expected due to no prior prescriptions written by our practice. No notes on file Pharmacokinetics: Liberation and absorption (onset of action): WNL Distribution (time to peak effect): WNL Metabolism and excretion (duration of action): WNL         Pharmacodynamics: Desired effects: Analgesia: Mr. Mester reports >50% benefit. Functional ability: Patient reports that medication allows him to accomplish basic ADLs Clinically meaningful improvement in function (CMIF): Sustained CMIF goals met Perceived  effectiveness: Described as relatively effective, allowing for increase in activities of daily living (ADL) Undesirable effects: Side-effects or Adverse reactions: None reported Monitoring: [place] PMP: Online review of the past 54-monthperiod previously conducted. Not applicable at this point since we have not taken over the patient's medication management yet. List of all UDS test(s) done:  Lab Results  Component Value Date   SUMMARY FINAL 07/28/2017   Last UDS on record: Summary  Date Value Ref Range Status  07/28/2017 FINAL  Final    Comment:    ==================================================================== TOXASSURE COMP DRUG ANALYSIS,UR ==================================================================== Test                             Result       Flag       Units Drug Present and Declared for Prescription Verification   Oxycodone                      660          EXPECTED   ng/mg creat   Oxymorphone                    185          EXPECTED   ng/mg creat   Noroxycodone                   609          EXPECTED   ng/mg creat   Noroxymorphone  50           EXPECTED   ng/mg creat    Sources of oxycodone are scheduled prescription medications.    Oxymorphone, noroxycodone, and noroxymorphone are expected    metabolites of oxycodone. Oxymorphone is also available as a    scheduled prescription medication.   Gabapentin                     PRESENT      EXPECTED Drug Present not Declared for Prescription Verification   Acetaminophen                  PRESENT      UNEXPECTED   Diphenhydramine                PRESENT      UNEXPECTED Drug Absent but Declared for Prescription Verification   Topiramate                     Not Detected UNEXPECTED ==================================================================== Test                      Result    Flag   Units      Ref Range   Creatinine              222              mg/dL       >=20 ==================================================================== Declared Medications:  The flagging and interpretation on this report are based on the  following declared medications.  Unexpected results may arise from  inaccuracies in the declared medications.  **Note: The testing scope of this panel includes these medications:  Gabapentin  Oxycodone  Topiramate (Topamax)  **Note: The testing scope of this panel does not include following  reported medications:  Enoxaparin (Lovenox) ==================================================================== For clinical consultation, please call [phone]. ====================================================================    UDS interpretation: No unexpected findings.          Medication Assessment Form: Patient introduced to form today Treatment compliance: Treatment may start today if patient agrees with proposed plan. Evaluation of compliance is not applicable at this point Risk Assessment Profile: Aberrant behavior: See initial evaluations. None observed or detected today Comorbid factors increasing risk of overdose: See initial evaluation. No additional risks detected today Medical Psychology Evaluation: Please see scanned results in medical record. Opioid Risk Tool - 07/28/17 1419      Family History of Substance Abuse   Alcohol  Negative    Illegal Drugs  Negative    Rx Drugs  Negative      Personal History of Substance Abuse   Alcohol  Negative    Illegal Drugs  Negative    Rx Drugs  Negative      Psychological Disease   Psychological Disease  Negative    Depression  Negative      Total Score   Opioid Risk Tool Scoring  0    Opioid Risk Interpretation  Low Risk      ORT Scoring interpretation table:  Score <3 = Low Risk for SUD  Score between 4-7 = Moderate Risk for SUD  Score >8 = High Risk for Opioid Abuse   Risk Mitigation Strategies:  Patient opioid safety counseling: Completed today.  Counseling provided to patient as per "Patient Counseling Document". Document signed by patient, attesting to counseling and understanding Patient-Prescriber Agreement (PPA): Obtained today.  Controlled substance notification to other providers: Written and sent today.  Pharmacologic Plan: Today we may be taking over the patient's pharmacological regimen. See below.             Laboratory Chemistry  Inflammation Markers (CRP: Acute Phase) (ESR: Chronic Phase) Lab Results  Component Value Date   CRP 11.4 (H) 07/28/2017   ESRSEDRATE 40 (H) 07/28/2017   LATICACIDVEN 0.9 07/20/2016                         Rheumatology Markers Lab Results  Component Value Date   ANA Negative 07/28/2017                Renal Function Markers Lab Results  Component Value Date   BUN 8 07/28/2017   CREATININE 1.29 (H) 07/28/2017   GFRAA 73 07/28/2017   GFRNONAA 63 07/28/2017                 Hepatic Function Markers Lab Results  Component Value Date   AST 43 (H) 07/28/2017   ALT 36 07/01/2017   ALBUMIN 4.1 07/28/2017   ALKPHOS 102 07/28/2017   LIPASE 90 03/02/2014                 Electrolytes Lab Results  Component Value Date   NA 140 07/28/2017   K 4.4 07/28/2017   CL 103 07/28/2017   CALCIUM 9.6 07/28/2017   MG 2.0 07/28/2017                        Neuropathy Markers Lab Results  Component Value Date   VITAMINB12 391 07/28/2017                 Bone Pathology Markers Lab Results  Component Value Date   25OHVITD1 10 (L) 07/28/2017   25OHVITD2 3.2 07/28/2017   25OHVITD3 6.8 07/28/2017                         Coagulation Parameters Lab Results  Component Value Date   INR 1.03 07/20/2016   LABPROT 13.5 07/20/2016   APTT 45 (H) 07/20/2016   PLT 369 07/01/2017                 Cardiovascular Markers Lab Results  Component Value Date   BNP 18.0 07/20/2016   TROPONINI <0.03 12/27/2016   HGB 13.1 07/01/2017   HCT 39.9 (L) 07/01/2017                 Note: Lab results  reviewed.  Recent Diagnostic Imaging Review  Thoracic Imaging: Thoracic MR wo contrast:  Results for orders placed during the hospital encounter of 09/14/16  MR THORACIC SPINE WO CONTRAST   Narrative CLINICAL DATA:  Right-sided chest and rib pain over the last 6-8 weeks.  EXAM: MRI THORACIC SPINE WITHOUT CONTRAST  TECHNIQUE: Multiplanar, multisequence MR imaging of the thoracic spine was performed. No intravenous contrast was administered.  COMPARISON:  CT chest 08/18/2016  FINDINGS: Alignment:  Normal  Vertebrae: No fracture or primary vertebral body lesion.  Cord:  No cord compression or primary cord lesion.  Paraspinal and other soft tissues: There are some ordinary costovertebral degenerative changes in the mid and lower thoracic spine. This is more pronounced on the right at T9-10 where there are larger osteophytes associated with the costovertebral articulation. No evidence of edematous or advanced facet arthropathy.  Disc levels:  No advanced degenerative disc disease. There are ordinary mild disc bulges at T5-6  and below. No focal herniation or compressive stenosis of the canal or foramina.  IMPRESSION: Ordinary mild degenerative disc disease in the thoracic spine with non-compressive disc bulges at T5-6 and below. No disc herniation or compressive stenosis of the canal or foramina.  Costovertebral osteoarthritis in the mid and lower thoracic spine which could be associated with back pain. On the right at T9-10, there are fairly bulky osteophytes emanating from the right costovertebral articulation. There is no evidence of neural compression secondary to this or the other bony degenerative changes.  Ordinary mild facet osteoarthritis without edematous change or significant hypertrophy.   Electronically Signed   By: Nelson Chimes M.D.   On: 09/14/2016 16:12    Complexity Note: Imaging results reviewed. Results shared with Justin [name], using Layman's  terms.                         Post-Procedure Assessment  Visit date not found Procedure: Diagnostic Right- sided Thoracic Facet Block, under fluoroscopy, no sedation. Pre-procedure pain score:        /10 Post-procedure pain score: 0/10         Influential Factors: BMI: 34.24 kg/m Intra-procedural challenges: None observed.         Assessment challenges: None detected.              Reported side-effects: None.        Post-procedural adverse reactions or complications: None reported         Sedation: No sedation used. When no sedatives are used, the analgesic levels obtained are directly associated to the effectiveness of the local anesthetics. However, when sedation is provided, the level of analgesia obtained during the initial 1 hour following the intervention, is believed to be the result of a combination of factors. These factors may include, but are not limited to: 1. The effectiveness of the local anesthetics used. 2. The effects of the analgesic(s) and/or anxiolytic(s) used. 3. The degree of discomfort experienced by the patient at the time of the procedure. 4. The patients ability and reliability in recalling and recording the events. 5. The presence and influence of possible secondary gains and/or psychosocial factors. Reported result: Relief experienced during the 1st hour after the procedure:   25% (Ultra-Short Term Relief)            Interpretative annotation: Clinically appropriate result. Analgesia during this period is likely to be Local Anesthetic and/or IV Sedative (Analgesic/Anxiolytic) related.          Effects of local anesthetic: The analgesic effects attained during this period are directly associated to the localized infiltration of local anesthetics and therefore cary significant diagnostic value as to the etiological location, or anatomical origin, of the pain. Expected duration of relief is directly dependent on the pharmacodynamics of the local anesthetic used.  Long-acting (4-6 hours) anesthetics used.  Reported result: Relief during the next 4 to 6 hour after the procedure:   0% (Short-Term Relief)            Interpretative annotation: Clinically appropriate result. Analgesia during this period is likely to be Local Anesthetic-related.          Long-term benefit: Defined as the period of time past the expected duration of local anesthetics (1 hour for short-acting and 4-6 hours for long-acting). With the possible exception of prolonged sympathetic blockade from the local anesthetics, benefits during this period are typically attributed to, or associated with, other factors such as analgesic sensory  neuropraxia, antiinflammatory effects, or beneficial biochemical changes provided by agents other than the local anesthetics.  Reported result: Extended relief following procedure:  0% (Long-Term Relief)            Interpretative annotation: Clinically appropriate result. Good relief. No permanent benefit expected. Inflammation plays a part in the etiology to the pain.          Current benefits: Defined as reported results that persistent at this point in time.   Analgesia: 0 %            Function: Somewhat improved ROM: Somewhat improved Interpretative annotation: Recurrence of symptoms. No permanent benefit expected. Effective diagnostic intervention.          Interpretation: Results would suggest a successful diagnostic intervention.                  Plan:  Please see "Plan of Care" for details.                Meds   Current Outpatient Medications:  .  enoxaparin (LOVENOX) 150 MG/ML injection, INJECT 0.74 MLS (110 MG TOTAL) INTO THE SKIN 2 (TWO) TIMES DAILY., Disp: 60 Syringe, Rfl: 0 .  gabapentin (NEURONTIN) 300 MG capsule, Take 1 capsule (300 mg total) by mouth 3 (three) times daily. Take 1 tab TID, can take an extra tab at bedtime (Patient taking differently: Take 300 mg by mouth 3 (three) times daily. Take 1 tab TID, can take an extra tab at  bedtime), Disp: 120 capsule, Rfl: 1 .  Calcium Carbonate-Vit D-Min (GNP CALCIUM 1200) 1200-1000 MG-UNIT CHEW, Chew 1,200 mg by mouth daily with breakfast. Take in combination with vitamin D and magnesium., Disp: 30 tablet, Rfl: 5 .  Cholecalciferol (VITAMIN D3) 5000 units CAPS, Take 1 capsule (5,000 Units total) by mouth daily with breakfast. Take along with calcium and magnesium., Disp: 30 capsule, Rfl: 5 .  ergocalciferol (VITAMIN D2) 50000 units capsule, Take 1 capsule (50,000 Units total) by mouth 2 (two) times a week. X 6 weeks., Disp: 12 capsule, Rfl: 0 .  Magnesium 500 MG CAPS, Take 1 capsule (500 mg total) by mouth 2 (two) times daily at 8 am and 10 pm., Disp: 60 capsule, Rfl: 5 .  oxyCODONE (OXY IR/ROXICODONE) 5 MG immediate release tablet, Take 1 tablet (5 mg total) by mouth every 8 (eight) hours as needed for severe pain., Disp: 90 tablet, Rfl: 0  ROS  Constitutional: Denies any fever or chills Gastrointestinal: No reported hemesis, hematochezia, vomiting, or acute GI distress Musculoskeletal: Denies any acute onset joint swelling, redness, loss of ROM, or weakness Neurological: No reported episodes of acute onset apraxia, aphasia, dysarthria, agnosia, amnesia, paralysis, loss of coordination, or loss of consciousness  Allergies  Justin [name] has No Known Allergies.  [place]  Drug: Justin [name]  reports that he does not use drugs. Alcohol:  reports that he does not drink alcohol. Tobacco:  reports that he has quit smoking. He smoked 0.50 packs per day. he has never used smokeless tobacco. Medical:  has a past medical history of Arm vein blood clot, DVT (deep venous thrombosis) ([place]) (2012; January 2016), GERD (gastroesophageal reflux disease), Rib pain on right side (08/04/2016), and Vascular thoracic outlet syndrome. Surgical: Justin [name]  has a past surgical history that includes Knee cartilage surgery; Carotid angiogram; Scalenotomy w/o resection cervical rib (2012); and Right upper  extremity thrombectomy (2012). Family: family history includes Breast cancer in his father; Cancer in his maternal grandmother; Diabetes in his  father; Heart attack in his father; Heart disease in his father; Leukemia in his maternal grandfather.  Constitutional Exam  General appearance: Well nourished, well developed, and well hydrated. In no apparent acute distress Vitals:   08/22/17 0839  BP: 139/63  Pulse: 87  Resp: 18  Temp: 98.1 F (36.7 C)  SpO2: 100%  Weight: 249 lb (112.9 kg)  Height: 5' 11.5" (1.816 m)   BMI Assessment: Estimated body mass index is 34.24 kg/m as calculated from the following:   Height as of this encounter: 5' 11.5" (1.816 m).   Weight as of this encounter: 249 lb (112.9 kg).  BMI interpretation table: BMI level Category Range association with higher incidence of chronic pain  <18 kg/m2 Underweight   18.5-24.9 kg/m2 Ideal body weight   25-29.9 kg/m2 Overweight Increased incidence by 20%  30-34.9 kg/m2 Obese (Class I) Increased incidence by 68%  35-39.9 kg/m2 Severe obesity (Class II) Increased incidence by 136%  >40 kg/m2 Extreme obesity (Class III) Increased incidence by 254%   BMI Readings from Last 4 Encounters:  08/22/17 34.24 kg/m  07/29/17 36.33 kg/m  07/28/17 34.73 kg/m  07/01/17 36.06 kg/m   Wt Readings from Last 4 Encounters:  08/22/17 249 lb (112.9 kg)  07/29/17 260 lb 8 oz (118.2 kg)  07/28/17 249 lb (112.9 kg)  07/01/17 258 lb 9 oz (117.3 kg)  Psych/Mental status: Alert, oriented x 3 (person, place, & time)       Eyes: PERLA Respiratory: No evidence of acute respiratory distress  Cervical Spine Area Exam  Skin & Axial Inspection: No masses, redness, edema, swelling, or associated skin lesions Alignment: Symmetrical Functional ROM: Unrestricted ROM      Stability: No instability detected Muscle Tone/Strength: Functionally intact. No obvious neuro-muscular anomalies detected. Sensory (Neurological): Unimpaired Palpation: No  palpable anomalies              Upper Extremity (UE) Exam    Side: Right upper extremity  Side: Left upper extremity  Skin & Extremity Inspection: Skin color, temperature, and hair growth are WNL. No peripheral edema or cyanosis. No masses, redness, swelling, asymmetry, or associated skin lesions. No contractures.  Skin & Extremity Inspection: Skin color, temperature, and hair growth are WNL. No peripheral edema or cyanosis. No masses, redness, swelling, asymmetry, or associated skin lesions. No contractures.  Functional ROM: Unrestricted ROM          Functional ROM: Unrestricted ROM          Muscle Tone/Strength: Functionally intact. No obvious neuro-muscular anomalies detected.  Muscle Tone/Strength: Functionally intact. No obvious neuro-muscular anomalies detected.  Sensory (Neurological): Unimpaired          Sensory (Neurological): Unimpaired          Palpation: No palpable anomalies              Palpation: No palpable anomalies              Specialized Test(s): Deferred         Specialized Test(s): Deferred          Thoracic Spine Area Exam  Skin & Axial Inspection: Well healed scar from previous spine surgery detected over the midclavicular line.  (non-tender) Alignment: Symmetrical Functional ROM: Unrestricted ROM Stability: No instability detected Muscle Tone/Strength: Functionally intact. No obvious neuro-muscular anomalies detected. Sensory (Neurological): Unimpaired Muscle strength & Tone: No palpable anomalies  Lumbar Spine Area Exam  Skin & Axial Inspection: No masses, redness, or swelling Alignment: Symmetrical Functional ROM: Unrestricted ROM  Stability: No instability detected Muscle Tone/Strength: Functionally intact. No obvious neuro-muscular anomalies detected. Sensory (Neurological): Unimpaired Palpation: No palpable anomalies       Provocative Tests: Lumbar Hyperextension and rotation test: evaluation deferred today       Lumbar Lateral bending test: evaluation  deferred today       Patrick's Maneuver: evaluation deferred today                    Gait & Posture Assessment  Ambulation: Unassisted Gait: Relatively normal for age and body habitus Posture: WNL   Lower Extremity Exam    Side: Right lower extremity  Side: Left lower extremity  Skin & Extremity Inspection: Skin color, temperature, and hair growth are WNL. No peripheral edema or cyanosis. No masses, redness, swelling, asymmetry, or associated skin lesions. No contractures.  Skin & Extremity Inspection: Skin color, temperature, and hair growth are WNL. No peripheral edema or cyanosis. No masses, redness, swelling, asymmetry, or associated skin lesions. No contractures.  Functional ROM: Unrestricted ROM          Functional ROM: Unrestricted ROM          Muscle Tone/Strength: Functionally intact. No obvious neuro-muscular anomalies detected.  Muscle Tone/Strength: Functionally intact. No obvious neuro-muscular anomalies detected.  Sensory (Neurological): Unimpaired  Sensory (Neurological): Unimpaired  Palpation: No palpable anomalies  Palpation: No palpable anomalies   Assessment & Plan  Primary Diagnosis & Pertinent Problem List: The primary encounter diagnosis was Chronic right-sided thoracic back pain (Primary Area of Pain). 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  Visit Diagnosis: 1. Chronic right-sided thoracic back pain  (Primary Area of Pain)   2. 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 Intercostal neuralgia (T6, T7) (Right)   4. Radiculopathy of thoracic region   5. Thoracic radiculitis (Secondary Area of Pain)   6. Right upper quadrant abdominal pain (Tertiary Area of Pain)   7. Rib pain on right side (Fourth Area of Pain)   8. Chronic pain syndrome   9. Elevated C-reactive protein (CRP)   10. Elevated sed rate   11. Lupus anticoagulant positive   12. Pharmacologic therapy   13. Problems influencing health status   14. Long term current use of opiate analgesic   15. Long term current use of anticoagulant (Lovenox)   16. 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  Resume 4 hours after procedures.    Time Note: Greater than 50% of the 40 minute(s) of face-to-face time spent with Justin [name], was spent in counseling/coordination of care regarding: the appropriate use of the pain scale, Justin [name] primary cause of pain, the results of his recent test(s), the significance of each one oth the test(s) anomalies and it's corresponding characteristic pain pattern(s), the treatment plan, treatment alternatives, the  risks and possible complications of proposed treatment, realistic expectations, the goals of pain management (increased in functionality), the medication agreement and the need to collect and read the AVS material. Plan of Care  Pharmacotherapy (Medications Ordered): Meds ordered this encounter  Medications  . ergocalciferol (VITAMIN D2) 50000 units capsule    Sig: Take 1 capsule (50,000 Units total) by mouth 2 (two) times a week. X 6 weeks.    Dispense:  12 capsule    Refill:  0    Do not add this medication to  the electronic "Automatic Refill" notification system. Patient may have prescription filled one day early if pharmacy is closed on scheduled refill date.  . Cholecalciferol (VITAMIN D3) 5000 units CAPS    Sig: Take 1 capsule (5,000 Units total) by mouth daily with breakfast. Take along with calcium and magnesium.    Dispense:  30 capsule    Refill:  5    Do not place medication on "Automatic Refill".  May substitute with similar over-the-counter product.  . Magnesium 500 MG CAPS    Sig: Take 1 capsule (500 mg total) by mouth 2 (two) times daily at 8 am and 10 pm.    Dispense:  60 capsule    Refill:  5    Do not place medication on "Automatic Refill".  The patient may use similar over-the-counter product.  . Calcium Carbonate-Vit D-Min (GNP CALCIUM 1200) 1200-1000 MG-UNIT CHEW    Sig: Chew 1,200 mg by mouth daily with breakfast. Take in combination with vitamin D and magnesium.    Dispense:  30 tablet    Refill:  5    Do not place medication on "Automatic Refill".  May substitute with similar over-the-counter product.  Marland Kitchen oxyCODONE (OXY IR/ROXICODONE) 5 MG immediate release tablet    Sig: Take 1 tablet (5 mg total) by mouth every 8 (eight) hours as needed for severe pain.    Dispense:  90 tablet    Refill:  0    Do not place this medication, or any other prescription from our practice, on "Automatic Refill". Patient may have prescription filled one day early if pharmacy is closed on scheduled refill date. Do not fill until: 08/22/17 To last until: 09/21/17    Procedure Orders     INTERCOSTAL NERVE BLOCK Lab Orders  No laboratory test(s) ordered today   Imaging Orders  No imaging studies ordered today   Referral Orders  No referral(s) requested today    Pharmacological management options:  Opioid Analgesics: We'll take over management today. See above orders Membrane stabilizer: We have discussed the possibility of optimizing this mode of therapy, if tolerated Muscle relaxant: We  have discussed the possibility of a trial NSAID: We have discussed the possibility of a trial Other analgesic(s): To be determined at a later time   Interventional management options: Planned, scheduled, and/or pending:    NOTE: LOVENOX Stop times 24 hours prior to procedures. Resume 4 hours after procedures. Diagnostic right-sided T6, T7 intercostal nerve blocks #1 under fluoroscopic guidance and IV sedation   Considering:   Diagnostic right-sided T6, T7 intercostal nerve blocks #1  Possible right-sided intercostal RFA  Diagnostic right-sided thoracic SNRB  Diagnostic right-sided thoracic facet medial nerve block  Possible right sided thoracic facet medial branch RFA    PRN Procedures:   None at this time   Provider-requested follow-up: Return for Procedure (w/ sedation): (R) T6/T7 Intercostal NB, (Blood-thinner Protocol).  Future Appointments  Date Time Provider [place]  09/08/2017  1:00 PM Nyra Capes [facility]  09/29/2017 10:15 AM [facility] None  09/29/2017 10:45 AM Mike Gip, Drue Second, MD [facility] None    Primary Care Physician: Valerie Roys, DO Location: [facility] Note by: Gaspar Cola, MD Date: 08/22/2017; Time: 10:19 AM

## 2017-08-22 ENCOUNTER — Ambulatory Visit: Payer: 59 | Attending: Pain Medicine | Admitting: Pain Medicine

## 2017-08-22 ENCOUNTER — Other Ambulatory Visit: Payer: Self-pay

## 2017-08-22 ENCOUNTER — Encounter: Payer: Self-pay | Admitting: Pain Medicine

## 2017-08-22 VITALS — BP 139/63 | HR 87 | Temp 98.1°F | Resp 18 | Ht 71.5 in | Wt 249.0 lb

## 2017-08-22 DIAGNOSIS — G8912 Acute post-thoracotomy pain: Secondary | ICD-10-CM | POA: Insufficient documentation

## 2017-08-22 DIAGNOSIS — Z86718 Personal history of other venous thrombosis and embolism: Secondary | ICD-10-CM | POA: Insufficient documentation

## 2017-08-22 DIAGNOSIS — R760 Raised antibody titer: Secondary | ICD-10-CM

## 2017-08-22 DIAGNOSIS — G588 Other specified mononeuropathies: Secondary | ICD-10-CM | POA: Insufficient documentation

## 2017-08-22 DIAGNOSIS — E559 Vitamin D deficiency, unspecified: Secondary | ICD-10-CM | POA: Diagnosis not present

## 2017-08-22 DIAGNOSIS — Z803 Family history of malignant neoplasm of breast: Secondary | ICD-10-CM | POA: Diagnosis not present

## 2017-08-22 DIAGNOSIS — Z87891 Personal history of nicotine dependence: Secondary | ICD-10-CM | POA: Diagnosis not present

## 2017-08-22 DIAGNOSIS — K219 Gastro-esophageal reflux disease without esophagitis: Secondary | ICD-10-CM | POA: Diagnosis present

## 2017-08-22 DIAGNOSIS — R7982 Elevated C-reactive protein (CRP): Secondary | ICD-10-CM | POA: Diagnosis not present

## 2017-08-22 DIAGNOSIS — M25512 Pain in left shoulder: Secondary | ICD-10-CM | POA: Insufficient documentation

## 2017-08-22 DIAGNOSIS — Z7901 Long term (current) use of anticoagulants: Secondary | ICD-10-CM | POA: Diagnosis not present

## 2017-08-22 DIAGNOSIS — Z8249 Family history of ischemic heart disease and other diseases of the circulatory system: Secondary | ICD-10-CM | POA: Insufficient documentation

## 2017-08-22 DIAGNOSIS — Z806 Family history of leukemia: Secondary | ICD-10-CM | POA: Insufficient documentation

## 2017-08-22 DIAGNOSIS — R9431 Abnormal electrocardiogram [ECG] [EKG]: Secondary | ICD-10-CM | POA: Diagnosis not present

## 2017-08-22 DIAGNOSIS — Z789 Other specified health status: Secondary | ICD-10-CM | POA: Diagnosis not present

## 2017-08-22 DIAGNOSIS — M5114 Intervertebral disc disorders with radiculopathy, thoracic region: Secondary | ICD-10-CM | POA: Diagnosis not present

## 2017-08-22 DIAGNOSIS — R700 Elevated erythrocyte sedimentation rate: Secondary | ICD-10-CM

## 2017-08-22 DIAGNOSIS — Z79899 Other long term (current) drug therapy: Secondary | ICD-10-CM | POA: Insufficient documentation

## 2017-08-22 DIAGNOSIS — R0781 Pleurodynia: Secondary | ICD-10-CM | POA: Diagnosis not present

## 2017-08-22 DIAGNOSIS — Z9889 Other specified postprocedural states: Secondary | ICD-10-CM | POA: Insufficient documentation

## 2017-08-22 DIAGNOSIS — Z833 Family history of diabetes mellitus: Secondary | ICD-10-CM | POA: Diagnosis not present

## 2017-08-22 DIAGNOSIS — M5414 Radiculopathy, thoracic region: Secondary | ICD-10-CM | POA: Diagnosis not present

## 2017-08-22 DIAGNOSIS — Z79891 Long term (current) use of opiate analgesic: Secondary | ICD-10-CM | POA: Diagnosis not present

## 2017-08-22 DIAGNOSIS — M546 Pain in thoracic spine: Secondary | ICD-10-CM | POA: Diagnosis not present

## 2017-08-22 DIAGNOSIS — R1011 Right upper quadrant pain: Secondary | ICD-10-CM | POA: Insufficient documentation

## 2017-08-22 DIAGNOSIS — R0789 Other chest pain: Secondary | ICD-10-CM | POA: Insufficient documentation

## 2017-08-22 DIAGNOSIS — G8929 Other chronic pain: Secondary | ICD-10-CM

## 2017-08-22 DIAGNOSIS — R0600 Dyspnea, unspecified: Secondary | ICD-10-CM | POA: Insufficient documentation

## 2017-08-22 DIAGNOSIS — G894 Chronic pain syndrome: Secondary | ICD-10-CM | POA: Diagnosis not present

## 2017-08-22 MED ORDER — ERGOCALCIFEROL 1.25 MG (50000 UT) PO CAPS: 50000.0000 [IU] | ORAL_CAPSULE | ORAL | 0 refills | Status: AC

## 2017-08-22 MED ORDER — VITAMIN D3 125 MCG (5000 UT) PO CAPS: 1.0000 | ORAL_CAPSULE | Freq: Every day | ORAL | 5 refills | Status: DC

## 2017-08-22 MED ORDER — GNP CALCIUM 1200 1200-1000 MG-UNIT PO CHEW: 1200.0000 mg | CHEWABLE_TABLET | Freq: Every day | ORAL | 5 refills | Status: DC

## 2017-08-22 MED ORDER — OXYCODONE HCL 5 MG PO TABS: 5.0000 mg | ORAL_TABLET | Freq: Three times a day (TID) | ORAL | 0 refills | Status: DC | PRN

## 2017-08-22 MED ORDER — MAGNESIUM 500 MG PO CAPS: 500.0000 mg | ORAL_CAPSULE | Freq: Two times a day (BID) | ORAL | 5 refills | Status: AC

## 2017-08-22 NOTE — Patient Instructions (Addendum)
____________________________________________________________________________________________  Pain Scale  Introduction: The pain score used by this practice is the Verbal Numerical Rating Scale (VNRS-11). This is an 11-point scale. It is for adults and children 10 years or older. There are significant differences in how the pain score is reported, used, and applied. Forget everything you learned in the past and learn this scoring system.  General Information: The scale should reflect your current level of pain. Unless you are specifically asked for the level of your worst pain, or your average pain. If you are asked for one of these two, then it should be understood that it is over the past 24 hours.  Basic Activities of Daily Living (ADL): Personal hygiene, dressing, eating, transferring, and using restroom.  Instructions: Most patients tend to report their level of pain as a combination of two factors, their physical pain and their psychosocial pain. This last one is also known as "suffering" and it is reflection of how physical pain affects you socially and psychologically. From now on, report them separately. From this point on, when asked to report your pain level, report only your physical pain. Use the following table for reference.  Pain Clinic Pain Levels (0-5/10)  Pain Level Score  Description  No Pain 0   Mild pain 1 Nagging, annoying, but does not interfere with basic activities of daily living (ADL). Patients are able to eat, bathe, get dressed, toileting (being able to get on and off the toilet and perform personal hygiene functions), transfer (move in and out of bed or a chair without assistance), and maintain continence (able to control bladder and bowel functions). Blood pressure and heart rate are unaffected. A normal heart rate for a healthy adult ranges from 60 to 100 bpm (beats per minute).   Mild to moderate pain 2 Noticeable and distracting. Impossible to hide from other  people. More frequent flare-ups. Still possible to adapt and function close to normal. It can be very annoying and may have occasional stronger flare-ups. With discipline, patients may get used to it and adapt.   Moderate pain 3 Interferes significantly with activities of daily living (ADL). It becomes difficult to feed, bathe, get dressed, get on and off the toilet or to perform personal hygiene functions. Difficult to get in and out of bed or a chair without assistance. Very distracting. With effort, it can be ignored when deeply involved in activities.   Moderately severe pain 4 Impossible to ignore for more than a few minutes. With effort, patients may still be able to manage work or participate in some social activities. Very difficult to concentrate. Signs of autonomic nervous system discharge are evident: dilated pupils (mydriasis); mild sweating (diaphoresis); sleep interference. Heart rate becomes elevated (>115 bpm). Diastolic blood pressure (lower number) rises above 100 mmHg. Patients find relief in laying down and not moving.   Severe pain 5 Intense and extremely unpleasant. Associated with frowning face and frequent crying. Pain overwhelms the senses.  Ability to do any activity or maintain social relationships becomes significantly limited. Conversation becomes difficult. Pacing back and forth is common, as getting into a comfortable position is nearly impossible. Pain wakes you up from deep sleep. Physical signs will be obvious: pupillary dilation; increased sweating; goosebumps; brisk reflexes; cold, clammy hands and feet; nausea, vomiting or dry heaves; loss of appetite; significant sleep disturbance with inability to fall asleep or to remain asleep. When persistent, significant weight loss is observed due to the complete loss of appetite and sleep deprivation.  Blood   pressure and heart rate becomes significantly elevated. Caution: If elevated blood pressure triggers a pounding headache  associated with blurred vision, then the patient should immediately seek attention at an urgent or emergency care unit, as these may be signs of an impending stroke.    Emergency Department Pain Levels (6-10/10)  Emergency Room Pain 6 Severely limiting. Requires emergency care and should not be seen or managed at an outpatient pain management facility. Communication becomes difficult and requires great effort. Assistance to reach the emergency department may be required. Facial flushing and profuse sweating along with potentially dangerous increases in heart rate and blood pressure will be evident.   Distressing pain 7 Self-care is very difficult. Assistance is required to transport, or use restroom. Assistance to reach the emergency department will be required. Tasks requiring coordination, such as bathing and getting dressed become very difficult.   Disabling pain 8 Self-care is no longer possible. At this level, pain is disabling. The individual is unable to do even the most "basic" activities such as walking, eating, bathing, dressing, transferring to a bed, or toileting. Fine motor skills are lost. It is difficult to think clearly.   Incapacitating pain 9 Pain becomes incapacitating. Thought processing is no longer possible. Difficult to remember your own name. Control of movement and coordination are lost.   The worst pain imaginable 10 At this level, most patients pass out from pain. When this level is reached, collapse of the autonomic nervous system occurs, leading to a sudden drop in blood pressure and heart rate. This in turn results in a temporary and dramatic drop in blood flow to the brain, leading to a loss of consciousness. Fainting is one of the body's self defense mechanisms. Passing out puts the brain in a calmed state and causes it to shut down for a while, in order to begin the healing process.    Summary: 1. Refer to this scale when providing us with your pain level. 2. Be  accurate and careful when reporting your pain level. This will help with your care. 3. Over-reporting your pain level will lead to loss of credibility. 4. Even a level of 1/10 means that there is pain and will be treated at our facility. 5. High, inaccurate reporting will be documented as "Symptom Exaggeration", leading to loss of credibility and suspicions of possible secondary gains such as obtaining more narcotics, or wanting to appear disabled, for fraudulent reasons. 6. Only pain levels of 5 or below will be seen at our facility. 7. Pain levels of 6 and above will be sent to the Emergency Department and the appointment cancelled. ____________________________________________________________________________________________   ____________________________________________________________________________________________  Preparing for Procedure with Sedation  Instructions: . Oral Intake: Do not eat or drink anything for at least 8 hours prior to your procedure. . Transportation: Public transportation is not allowed. Bring an adult driver. The driver must be physically present in our waiting room before any procedure can be started. . Physical Assistance: Bring an adult physically capable of assisting you, in the event you need help. This adult should keep you company at home for at least 6 hours after the procedure. . Blood Pressure Medicine: Take your blood pressure medicine with a sip of water the morning of the procedure. . Blood thinners:  . Diabetics on insulin: Notify the staff so that you can be scheduled 1st case in the morning. If your diabetes requires high dose insulin, take only  of your normal insulin dose the morning of the procedure and   notify the staff that you have done so. . Preventing infections: Shower with an antibacterial soap the morning of your procedure. . Build-up your immune system: Take 1000 mg of Vitamin C with every meal (3 times a day) the day prior to your  procedure. Marland Kitchen Antibiotics: Inform the staff if you have a condition or reason that requires you to take antibiotics before dental procedures. . Pregnancy: If you are pregnant, call and cancel the procedure. . Sickness: If you have a cold, fever, or any active infections, call and cancel the procedure. . Arrival: You must be in the facility at least 30 minutes prior to your scheduled procedure. . Children: Do not bring children with you. . Dress appropriately: Bring dark clothing that you would not mind if they get stained. . Valuables: Do not bring any jewelry or valuables.  Procedure appointments are reserved for interventional treatments only. Marland Kitchen No Prescription Refills. . No medication changes will be discussed during procedure appointments. . No disability issues will be discussed.  Remember:  Regular Business hours are:  Monday to Thursday 8:00 AM to 4:00 PM  Provider's Schedule: Milinda Pointer, MD:  Procedure days: Tuesday and Thursday 7:30 AM to 4:00 PM  Gillis Santa, MD:  Procedure days: Monday and Wednesday 7:30 AM to 4:00 PM ____________________________________________________________________________________________  ____________________________________________________________________________________________  Medication Rules  Applies to: All patients receiving prescriptions (written or electronic).  Pharmacy of record: Pharmacy where electronic prescriptions will be sent. If written prescriptions are taken to a different pharmacy, please inform the nursing staff. The pharmacy listed in the electronic medical record should be the one where you would like electronic prescriptions to be sent.  Prescription refills: Only during scheduled appointments. Applies to both, written and electronic prescriptions.  NOTE: The following applies primarily to controlled substances (Opioid* Pain Medications).   Patient's responsibilities: 1. Pain Pills: Bring all pain pills to every  appointment (except for procedure appointments). 2. Pill Bottles: Bring pills in original pharmacy bottle. Always bring newest bottle. Bring bottle, even if empty. 3. Medication refills: You are responsible for knowing and keeping track of what medications you need refilled. The day before your appointment, write a list of all prescriptions that need to be refilled. Bring that list to your appointment and give it to the admitting nurse. Prescriptions will be written only during appointments. If you forget a medication, it will not be "Called in", "Faxed", or "electronically sent". You will need to get another appointment to get these prescribed. 4. Prescription Accuracy: You are responsible for carefully inspecting your prescriptions before leaving our office. Have the discharge nurse carefully go over each prescription with you, before taking them home. Make sure that your name is accurately spelled, that your address is correct. Check the name and dose of your medication to make sure it is accurate. Check the number of pills, and the written instructions to make sure they are clear and accurate. Make sure that you are given enough medication to last until your next medication refill appointment. 5. Taking Medication: Take medication as prescribed. Never take more pills than instructed. Never take medication more frequently than prescribed. Taking less pills or less frequently is permitted and encouraged, when it comes to controlled substances (written prescriptions).  6. Inform other Doctors: Always inform, all of your healthcare providers, of all the medications you take. 7. Pain Medication from other Providers: You are not allowed to accept any additional pain medication from any other Doctor or Healthcare provider. There are two exceptions to  this rule. (see below) In the event that you require additional pain medication, you are responsible for notifying us, as stated below. 8. Medication Agreement: You  are responsible for carefully reading and following our Medication Agreement. This must be signed before receiving any prescriptions from our practice. Safely store a copy of your signed Agreement. Violations to the Agreement will result in no further prescriptions. (Additional copies of our Medication Agreement are available upon request.) 9. Laws, Rules, & Regulations: All patients are expected to follow all Federal and [employer], [employer], Rules, [employer]. Ignorance of the Laws does not constitute a valid excuse. The use of any illegal substances is prohibited. 10. Adopted CDC guidelines & recommendations: Target dosing levels will be at or below 60 MME/day. Use of benzodiazepines** is not recommended.  Exceptions: There are only two exceptions to the rule of not receiving pain medications from other Healthcare Providers. 1. Exception #1 (Emergencies): In the event of an emergency (i.e.: accident requiring emergency care), you are allowed to receive additional pain medication. However, you are responsible for: As soon as you are able, call our office [phone], at any time of the day or night, and leave a message stating your name, the date and nature of the emergency, and the name and dose of the medication prescribed. In the event that your call is answered by a member of our staff, make sure to document and save the date, time, and the name of the person that took your information.  2. Exception #2 (Planned Surgery): In the event that you are scheduled by another doctor or dentist to have any type of surgery or procedure, you are allowed (for a period no longer than 30 days), to receive additional pain medication, for the acute post-op pain. However, in this case, you are responsible for picking up a copy of our "Post-op Pain Management for Surgeons" handout, and giving it to your surgeon or dentist. This document is available at our office, and does not require an appointment to obtain it.  Simply go to our office during business hours (Monday-Thursday from 8:00 AM to 4:00 PM) (Friday 8:00 AM to 12:00 Noon) or if you have a scheduled appointment with [place], prior to your surgery, and ask for it by name. In addition, you will need to provide [place] with your name, name of your surgeon, type of surgery, and date of procedure or surgery.  *Opioid medications include: morphine, codeine, oxycodone, oxymorphone, hydrocodone, hydromorphone, meperidine, tramadol, tapentadol, buprenorphine, fentanyl, methadone. **Benzodiazepine medications include: diazepam (Valium), alprazolam (Xanax), clonazepam (Klonopine), lorazepam (Ativan), clorazepate (Tranxene), chlordiazepoxide (Librium), estazolam (Prosom), oxazepam (Serax), temazepam (Restoril), triazolam (Halcion) (Last updated: 08/04/2017) ____________________________________________________________________________________________  ____________________________________________________________________________________________  Medication Recommendations and Reminders  Applies to: All patients receiving prescriptions (written and/or electronic).  Medication Rules & Regulations: These rules and regulations exist for your safety and that of others. They are not flexible and neither are we. Dismissing or ignoring them will be considered "non-compliance" with medication therapy, resulting in complete and irreversible termination of such therapy. (See document titled "Medication Rules" for more details.) In all conscience, because of safety reasons, we cannot continue providing a therapy where the patient does not follow instructions.  Pharmacy of record:   Definition: This is the pharmacy where your electronic prescriptions will be sent.   We do not endorse any particular pharmacy.  You are not restricted in your choice of pharmacy.  The pharmacy listed in the electronic medical record should be the one where you want  electronic prescriptions to be  sent.  If you choose to change pharmacy, simply notify our nursing staff of your choice of new pharmacy.  Recommendations:  Keep all of your pain medications in a safe place, under lock and key, even if you live alone.   After you fill your prescription, take 1 week's worth of pills and put them away in a safe place. You should keep a separate, properly labeled bottle for this purpose. The remainder should be kept in the original bottle. Use this as your primary supply, until it runs out. Once it's gone, then you know that you have 1 week's worth of medicine, and it is time to come in for a prescription refill. If you do this correctly, it is unlikely that you will ever run out of medicine.  To make sure that the above recommendation works, it is very important that you make sure your medication refill appointments are scheduled at least 1 week before you run out of medicine. To do this in an effective manner, make sure that you do not leave the office without scheduling your next medication management appointment. Always ask the nursing staff to show you in your prescription , when your medication will be running out. Then arrange for the receptionist to get you a return appointment, at least 7 days before you run out of medicine. Do not wait until you have 1 or 2 pills left, to come in. This is very poor planning and does not take into consideration that we may need to cancel appointments due to bad weather, sickness, or emergencies affecting our staff.  Prescription refills and/or changes in medication(s):   Prescription refills, and/or changes in dose or medication, will be conducted only during scheduled medication management appointments. (Applies to both, written and electronic prescriptions.)  No refills on procedure days. No medication will be changed or started on procedure days. No changes, adjustments, and/or refills will be conducted on a procedure day. Doing so will interfere with the  diagnostic portion of the procedure.  No phone refills. No medications will be "called into the pharmacy".  No Fax refills.  No weekend refills.  No Holliday refills.  No after hours refills.  Remember:  Business hours are:  Monday to Thursday 8:00 AM to 4:00 PM Provider's Schedule: Dionisio David, NP - Appointments are:  Medication management: Monday to Thursday 8:00 AM to 4:00 PM Milinda Pointer, MD - Appointments are:  Medication management: Monday and Wednesday 8:00 AM to 4:00 PM Procedure day: Tuesday and Thursday 7:30 AM to 4:00 PM Gillis Santa, MD - Appointments are:  Medication management: Tuesday and Thursday 8:00 AM to 4:00 PM Procedure day: Monday and Wednesday 7:30 AM to 4:00 PM (Last update: 08/04/2017) ____________________________________________________________________________________________

## 2017-08-23 LAB — ANA W/REFLEX IF POSITIVE: Anti Nuclear Antibody(ANA): NEGATIVE

## 2017-08-23 LAB — SPECIMEN STATUS REPORT

## 2017-08-29 ENCOUNTER — Other Ambulatory Visit: Payer: Self-pay | Admitting: Urgent Care

## 2017-08-29 DIAGNOSIS — R918 Other nonspecific abnormal finding of lung field: Secondary | ICD-10-CM

## 2017-08-29 DIAGNOSIS — M898X1 Other specified disorders of bone, shoulder: Secondary | ICD-10-CM

## 2017-08-29 DIAGNOSIS — I82B12 Acute embolism and thrombosis of left subclavian vein: Secondary | ICD-10-CM

## 2017-08-29 DIAGNOSIS — R760 Raised antibody titer: Secondary | ICD-10-CM

## 2017-08-31 DIAGNOSIS — G588 Other specified mononeuropathies: Secondary | ICD-10-CM | POA: Diagnosis not present

## 2017-09-01 ENCOUNTER — Encounter: Payer: Self-pay | Admitting: Pain Medicine

## 2017-09-01 ENCOUNTER — Other Ambulatory Visit: Payer: Self-pay

## 2017-09-01 ENCOUNTER — Ambulatory Visit
Admission: RE | Admit: 2017-09-01 | Discharge: 2017-09-01 | Disposition: A | Discharge status: Home / Self Care | Payer: 59 | Source: Ambulatory Visit | Attending: Pain Medicine | Admitting: Pain Medicine

## 2017-09-01 ENCOUNTER — Ambulatory Visit (HOSPITAL_BASED_OUTPATIENT_CLINIC_OR_DEPARTMENT_OTHER): Payer: 59 | Admitting: Pain Medicine

## 2017-09-01 VITALS — BP 130/80 | HR 74 | Temp 98.3°F | Resp 16 | Ht 71.0 in | Wt 250.0 lb

## 2017-09-01 DIAGNOSIS — G8912 Acute post-thoracotomy pain: Secondary | ICD-10-CM

## 2017-09-01 DIAGNOSIS — Z7901 Long term (current) use of anticoagulants: Secondary | ICD-10-CM | POA: Diagnosis not present

## 2017-09-01 DIAGNOSIS — R0781 Pleurodynia: Secondary | ICD-10-CM

## 2017-09-01 DIAGNOSIS — M546 Pain in thoracic spine: Secondary | ICD-10-CM

## 2017-09-01 DIAGNOSIS — M5414 Radiculopathy, thoracic region: Secondary | ICD-10-CM | POA: Insufficient documentation

## 2017-09-01 DIAGNOSIS — G8929 Other chronic pain: Secondary | ICD-10-CM | POA: Insufficient documentation

## 2017-09-01 DIAGNOSIS — M5442 Lumbago with sciatica, left side: Secondary | ICD-10-CM

## 2017-09-01 DIAGNOSIS — G588 Other specified mononeuropathies: Secondary | ICD-10-CM

## 2017-09-01 MED ORDER — LACTATED RINGERS IV SOLN: 1000.0000 mL | Freq: Once | INTRAVENOUS | Status: DC

## 2017-09-01 MED ORDER — DEXAMETHASONE SODIUM PHOSPHATE 10 MG/ML IJ SOLN
10.0000 mg | Freq: Once | INTRAMUSCULAR | Status: AC
  Administered 2017-09-01: 10 mg
  Filled 2017-09-01: qty 1

## 2017-09-01 MED ORDER — MIDAZOLAM HCL 5 MG/5ML IJ SOLN: 1.0000 mg | INTRAMUSCULAR | Status: DC | PRN

## 2017-09-01 MED ORDER — LIDOCAINE HCL 2 % IJ SOLN
20.0000 mL | Freq: Once | INTRAMUSCULAR | Status: AC
  Administered 2017-09-01: 400 mg
  Filled 2017-09-01: qty 20

## 2017-09-01 MED ORDER — FENTANYL CITRATE (PF) 100 MCG/2ML IJ SOLN: 25.0000 ug | INTRAMUSCULAR | Status: DC | PRN

## 2017-09-01 MED ORDER — ROPIVACAINE HCL 2 MG/ML IJ SOLN
9.0000 mL | Freq: Once | INTRAMUSCULAR | Status: AC
  Administered 2017-09-01: 10 mL
  Filled 2017-09-01: qty 10

## 2017-09-01 NOTE — Progress Notes (Signed)
Patient's Name: Justin [name]  MRN: [id number]  Referring Provider: Milinda Pointer, MD  DOB: 05/11/1966  PCP: Valerie Roys, DO  DOS: 09/01/2017  Note by: Gaspar Cola, MD  Service setting: Ambulatory outpatient  Specialty: Interventional Pain Management  Patient type: Established  Location: [facility] (AMB) Pain Management Facility  Visit type: Interventional Procedure   Primary Reason for Visit: Interventional Pain Management Treatment. CC: Back Pain (right, lower)  Procedure:  Anesthesia, Analgesia, Anxiolysis:  Type: Diagnostic Posterior Intercostal  Nerve Block Region: Posterolateral Thoracic Area Level: T6, T7 Ribs Laterality: Right-Sided Position: Left Lateral Decubitus  Type: Local Anesthesia Indication(s): Analgesia         Route: Infiltration ([place]/IM) IV Access: Declined Sedation: None since NPO instructions were not followed  Local Anesthetic: Lidocaine 1-2%   Indications: 1. Chronic thoracic back pain (Primary Area of Pain) (Right)   2. Intercostal neuralgia (T6, T7) (Right)   3. 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 Rib pain on right side (Fourth Area of Pain)   5. Thoracic radiculitis (Secondary Area of Pain)   6. Long term current use of anticoagulant (Lovenox)    Pain Score: Pre-procedure: 7 /10 Post-procedure: 0-No pain/10  Pre-op Assessment:  Justin [name] is a 52 y.o. (year old), male patient, seen today for interventional treatment. He  has a past surgical history that includes Knee cartilage surgery; Carotid angiogram; Scalenotomy w/o resection cervical rib (2012); and Right upper extremity thrombectomy (2012). Justin [name] has a current medication list which includes the following prescription(s): gnp calcium 1200, vitamin d3, enoxaparin, ergocalciferol, gabapentin, magnesium, and oxycodone. His primarily concern today is the Back Pain (right, lower)  Initial Vital Signs:  Pulse Rate: 74 Temp: 98.3 F (36.8 C) Resp:  14 BP: (!) 149/77 SpO2: 99 %  BMI: Estimated body mass index is 34.87 kg/m as calculated from the following:   Height as of this encounter: 5\' 11"  (1.803 m).   Weight as of this encounter: 250 lb (113.4 kg).  Risk Assessment: Allergies: Reviewed. He has No Known Allergies.  Allergy Precautions: None required Coagulopathies: Reviewed. None identified.  Blood-thinner therapy: None at this time Active Infection(s): Reviewed. None identified. Justin [name] is afebrile  Site Confirmation: Justin [name] was asked to confirm the procedure and laterality before marking the site Procedure checklist: Completed Consent: Before the procedure and under the influence of no sedative(s), amnesic(s), or anxiolytics, the patient was informed of the treatment options, risks and possible complications. To fulfill our ethical and legal obligations, as recommended by the American Medical Association's Code of Ethics, I have informed the patient of my clinical impression; the nature and purpose of the treatment or procedure; the risks, benefits, and possible complications of the intervention; the alternatives, including doing nothing; the risk(s) and benefit(s) of the alternative treatment(s) or procedure(s); and the risk(s) and benefit(s) of doing nothing. The patient was provided information about the general risks and possible complications associated with the procedure. These may include, but are not limited to: failure to achieve desired goals, infection, bleeding, organ or nerve damage, allergic reactions, paralysis, and death. In addition, the patient was informed of those risks and complications associated to the procedure, such as failure to decrease pain; infection; bleeding; organ or nerve damage with subsequent damage to sensory, motor, and/or autonomic systems, resulting in permanent pain, numbness, and/or weakness of one or several areas of the body; allergic reactions; (i.e.: anaphylactic reaction); and/or  death. Furthermore, the patient was informed of those risks and  complications associated with the medications. These include, but are not limited to: allergic reactions (i.e.: anaphylactic or anaphylactoid reaction(s)); adrenal axis suppression; blood sugar elevation that in diabetics may result in ketoacidosis or comma; water retention that in patients with history of congestive heart failure may result in shortness of breath, pulmonary edema, and decompensation with resultant heart failure; weight gain; swelling or edema; medication-induced neural toxicity; particulate matter embolism and blood vessel occlusion with resultant organ, and/or nervous system infarction; and/or aseptic necrosis of one or more joints. Finally, the patient was informed that Medicine is not an exact science; therefore, there is also the possibility of unforeseen or unpredictable risks and/or possible complications that may result in a catastrophic outcome. The patient indicated having understood very clearly. We have given the patient no guarantees and we have made no promises. Enough time was given to the patient to ask questions, all of which were answered to the patient's satisfaction. Justin [name] has indicated that he wanted to continue with the procedure. Attestation: I, the ordering provider, attest that I have discussed with the patient the benefits, risks, side-effects, alternatives, likelihood of achieving goals, and potential problems during recovery for the procedure that I have provided informed consent. Date  Time: 09/01/2017  9:49 AM  Pre-Procedure Preparation:  Monitoring: As per clinic protocol. Respiration, ETCO2, SpO2, BP, heart rate and rhythm monitor placed and checked for adequate function Safety Precautions: Patient was assessed for positional comfort and pressure points before starting the procedure. Time-out: I initiated and conducted the "Time-out" before starting the procedure, as per protocol. The  patient was asked to participate by confirming the accuracy of the "Time Out" information. Verification of the correct person, site, and procedure were performed and confirmed by me, the nursing staff, and the patient. "Time-out" conducted as per Joint Commission's Universal Protocol (UP.01.01.01). Time: 1051  Description of Procedure Process:   Target Area: The sub-costal neurovascular bundle area Approach: Sub-costal approach Area Prepped: Entire Mid-Axillary Thoracic Region Prepping solution: ChloraPrep (2% chlorhexidine gluconate and 70% isopropyl alcohol) Safety Precautions: Aspiration looking for blood return was conducted prior to all injections. At no point did we inject any substances, as a needle was being advanced. No attempts were made at seeking any paresthesias. Safe injection practices and needle disposal techniques used. Medications properly checked for expiration dates. SDV (single dose vial) medications used. Description of the Procedure: Protocol guidelines were followed. The patient was placed in position over the procedure table. The target area was identified and the area prepped in the usual manner. Skin & deeper tissues infiltrated with local anesthetic. After cleaning the skin with an antiseptic solution, 1-2 mL of dilute local anesthetic was infiltrated subcutaneously at the planned injection site. The fingers of the palpating hand were used to straddle the insertion site at the inferior border of the rib and fix the skin to avoid unwanted skin movement. Appropriate amount of time allowed to pass for local anesthetics to take effect. The procedure needles were then advanced to the target area at an angle of approximately 20 cephalad to the skin. Contact with the rib was made. While maintaining the same angle of insertion, the needle was walked off the inferior border of the rib as the skin was allowed to return to its initial position. Then the needle was advanced no more than 3  mm below the inferior margin of the rib. Proper needle placement was secured. Negative aspiration confirmed. Following negative aspiration for blood or air, 3-5 mL of local anesthetic  was injected. The solution injected in intermittent fashion, asking for systemic symptoms every 0.5cc of injectate. The needle was then removed and the area cleansed, making sure to leave some of the prepping solution back to take advantage of its long term bactericidal properties. Vitals:   09/01/17 1050 09/01/17 1055 09/01/17 1100 09/01/17 1105  BP: (!) 164/49 (!) 153/77 (!) 157/100 130/80  Pulse:      Resp: (!) 21 16 (!) 26 16  Temp:      TempSrc:      SpO2: 100% 100% 100% 100%  Weight:      Height:        Start Time: 1051 hrs. End Time: 1104 hrs.   Materials:  Needle(s) Type: Regular needle Gauge: 22G Length: 3.5-in Medication(s): Please see orders for medications and dosing details.  Imaging Guidance (Non-Spinal):  Type of Imaging Technique: Fluoroscopy Guidance (Non-Spinal) Indication(s): Assistance in needle guidance and placement for procedures requiring needle placement in or near specific anatomical locations not easily accessible without such assistance. Exposure Time: Please see nurses notes. Contrast: Before injecting any contrast, we confirmed that the patient did not have an allergy to iodine, shellfish, or radiological contrast. Once satisfactory needle placement was completed at the desired level, radiological contrast was injected. Contrast injected under live fluoroscopy. No contrast complications. See chart for type and volume of contrast used. Fluoroscopic Guidance: I was personally present during the use of fluoroscopy. "Tunnel Vision Technique" used to obtain the best possible view of the target area. Parallax error corrected before commencing the procedure. "Direction-depth-direction" technique used to introduce the needle under continuous pulsed fluoroscopy. Once target was reached,  antero-posterior, oblique, and lateral fluoroscopic projection used confirm needle placement in all planes. Images permanently stored in EMR. Interpretation: I personally interpreted the imaging intraoperatively. Adequate needle placement confirmed in multiple planes. Appropriate spread of contrast into desired area was observed. No evidence of afferent or efferent intravascular uptake. Permanent images saved into the patient's record.  Antibiotic Prophylaxis:   Anti-infectives (From admission, onward)   None     Indication(s): None identified  Post-operative Assessment:  Post-procedure Vital Signs:  Pulse Rate: 74 Temp: 98.3 F (36.8 C) Resp: 16 BP: 130/80 SpO2: 100 %  EBL: None  Complications: No immediate post-treatment complications observed by team, or reported by patient.  Note: The patient tolerated the entire procedure well. A repeat set of vitals were taken after the procedure and the patient was kept under observation following institutional policy, for this type of procedure. Post-procedural neurological assessment was performed, showing return to baseline, prior to discharge. The patient was provided with post-procedure discharge instructions, including a section on how to identify potential problems. Should any problems arise concerning this procedure, the patient was given instructions to immediately contact us, at any time, without hesitation. In any case, we plan to contact the patient by telephone for a follow-up status report regarding this interventional procedure.  Comments:  No additional relevant information.  Plan of Care    Imaging Orders     DG C-Arm 1-60 Min-No Report  Procedure Orders     INTERCOSTAL NERVE BLOCK  Medications ordered for procedure: Meds ordered this encounter  Medications  . lidocaine (XYLOCAINE) 2 % (with pres) injection 400 mg  . DISCONTD: midazolam (VERSED) 5 MG/5ML injection 1-2 mg    Make sure Flumazenil is available in the  pyxis when using this medication. If oversedation occurs, administer 0.2 mg IV over 15 sec. If after 45 sec no response, administer  0.2 mg again over 1 min; may repeat at 1 min intervals; not to exceed 4 doses (1 mg)  . DISCONTD: fentaNYL (SUBLIMAZE) injection 25-50 mcg    Make sure Narcan is available in the pyxis when using this medication. In the event of respiratory depression (RR< 8/min): Titrate NARCAN (naloxone) in increments of 0.1 to 0.2 mg IV at 2-3 minute intervals, until desired degree of reversal.  . DISCONTD: lactated ringers infusion 1,000 mL  . dexamethasone (DECADRON) injection 10 mg  . ropivacaine (PF) 2 mg/mL (0.2%) (NAROPIN) injection 9 mL   Medications administered: We administered lidocaine, dexamethasone, and ropivacaine (PF) 2 mg/mL (0.2%).  See the medical record for exact dosing, route, and time of administration.  New Prescriptions   No medications on file   Disposition: Discharge home  Discharge Date & Time: 09/01/2017; 1110 hrs.   Physician-requested Follow-up: Return for post-procedure eval (2 wks), w/ Dr. Dossie Arbour.  Future Appointments  Date Time Provider [place]  09/08/2017  1:00 PM Nyra Capes [facility]  09/21/2017  1:45 PM Milinda Pointer, MD [facility] None  09/29/2017 10:15 AM [facility] None  09/29/2017 10:45 AM Lequita Asal, MD [facility] None   Primary Care Physician: Valerie Roys, DO Location: [facility] Note by: Gaspar Cola, MD Date: 09/01/2017; Time: 12:35 PM  Disclaimer:  Medicine is not an Chief Strategy Officer. The only guarantee in medicine is that nothing is guaranteed. It is important to note that the decision to proceed with this intervention was based on the information collected from the patient. The Data and conclusions were drawn from the patient's questionnaire, the interview, and the physical examination. Because the information was provided in large  part by the patient, it cannot be guaranteed that it has not been purposely or unconsciously manipulated. Every effort has been made to obtain as much relevant data as possible for this evaluation. It is important to note that the conclusions that lead to this procedure are derived in large part from the available data. Always take into account that the treatment will also be dependent on availability of resources and existing treatment guidelines, considered by other Pain Management Practitioners as being common knowledge and practice, at the time of the intervention. For Medico-Legal purposes, it is also important to point out that variation in procedural techniques and pharmacological choices are the acceptable norm. The indications, contraindications, technique, and results of the above procedure should only be interpreted and judged by a Board-Certified Interventional Pain Specialist with extensive familiarity and expertise in the same exact procedure and technique.

## 2017-09-01 NOTE — Patient Instructions (Signed)

## 2017-09-02 ENCOUNTER — Telehealth: Payer: Self-pay

## 2017-09-02 NOTE — Telephone Encounter (Signed)
Post procedure phone call.  Left message.  

## 2017-09-05 DIAGNOSIS — G588 Other specified mononeuropathies: Secondary | ICD-10-CM | POA: Diagnosis not present

## 2017-09-05 IMAGING — CR DG CHEST 2V
2 series · 2 of 2 positions shown · non-contrast
Comparison: Chest radiograph November 21, 2010 and chest CT July 02, 2016

CLINICAL DATA: Shortness of breath and chest pain for 2 days

EXAM:
CHEST  2 VIEW

[chest pa]
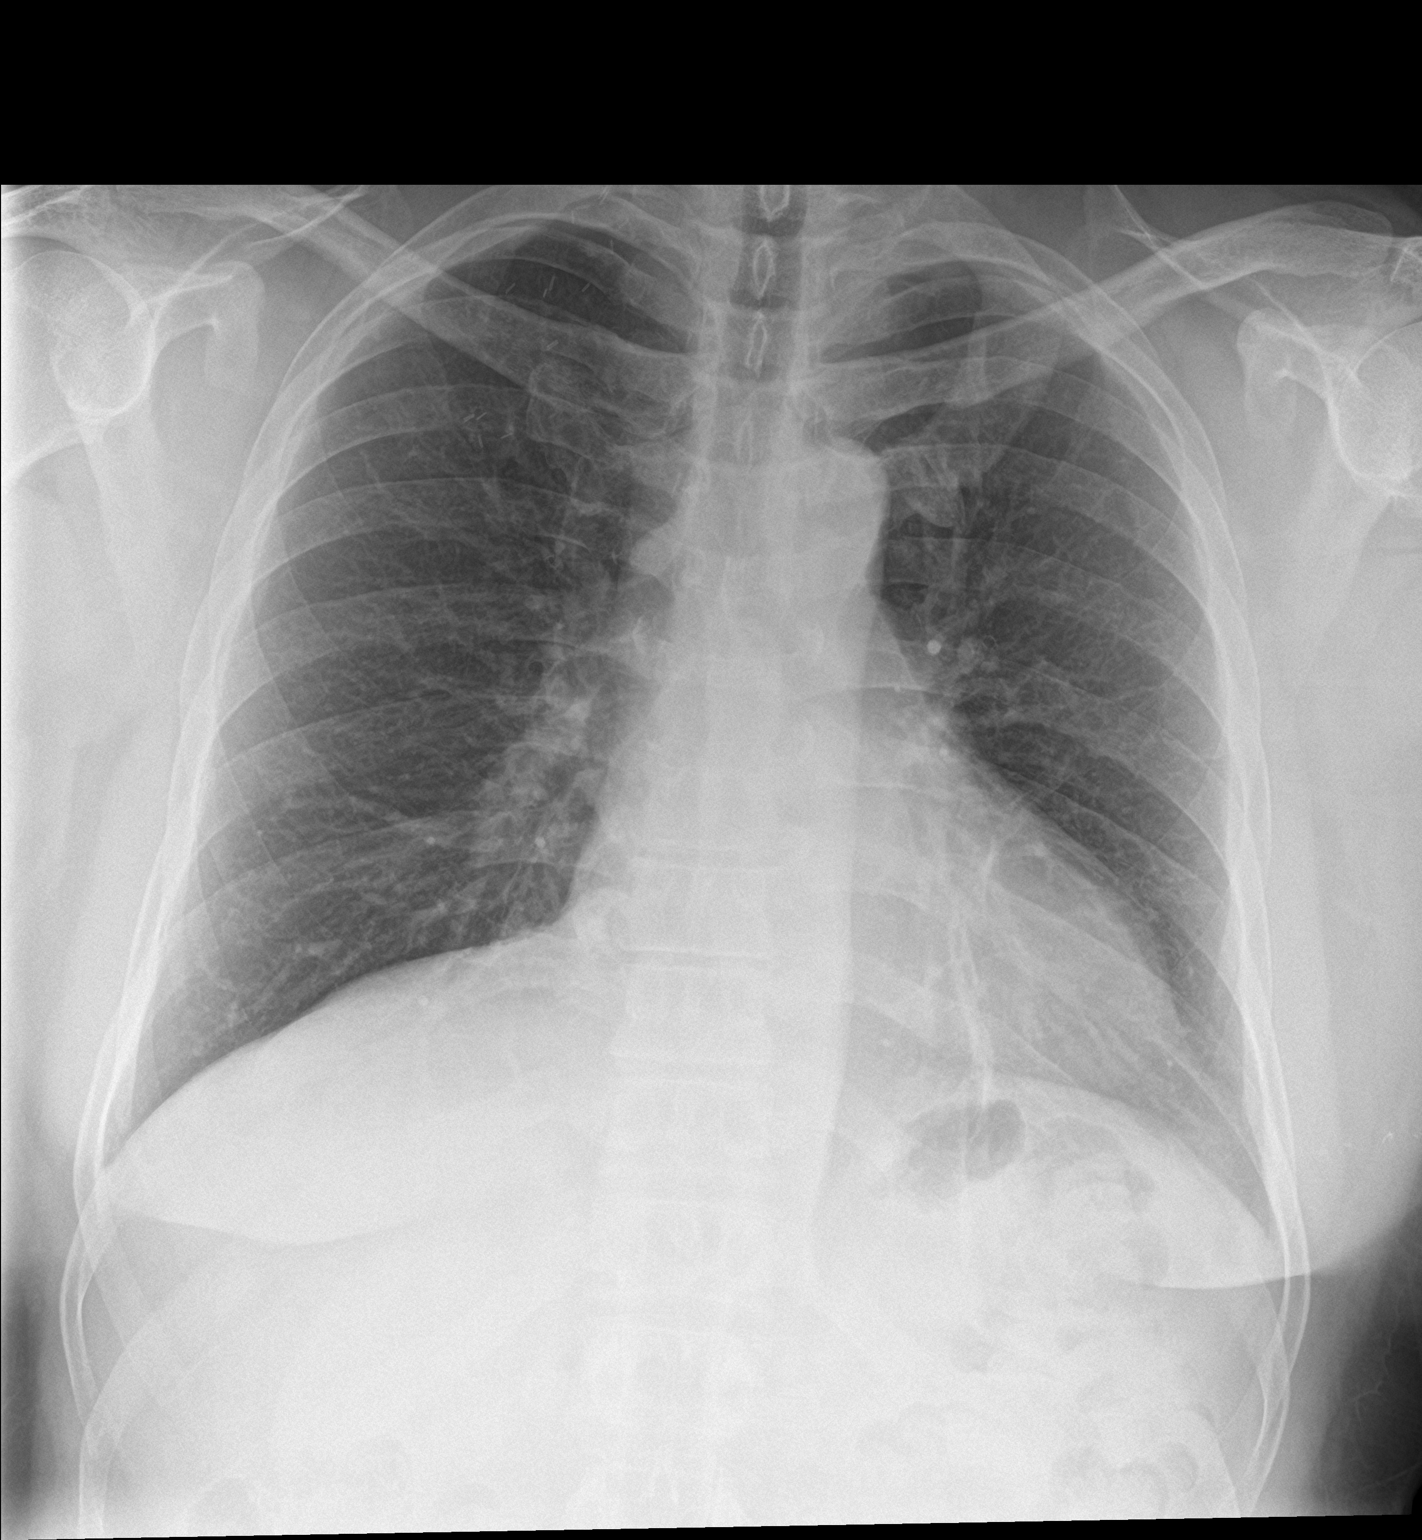

[chest lat]
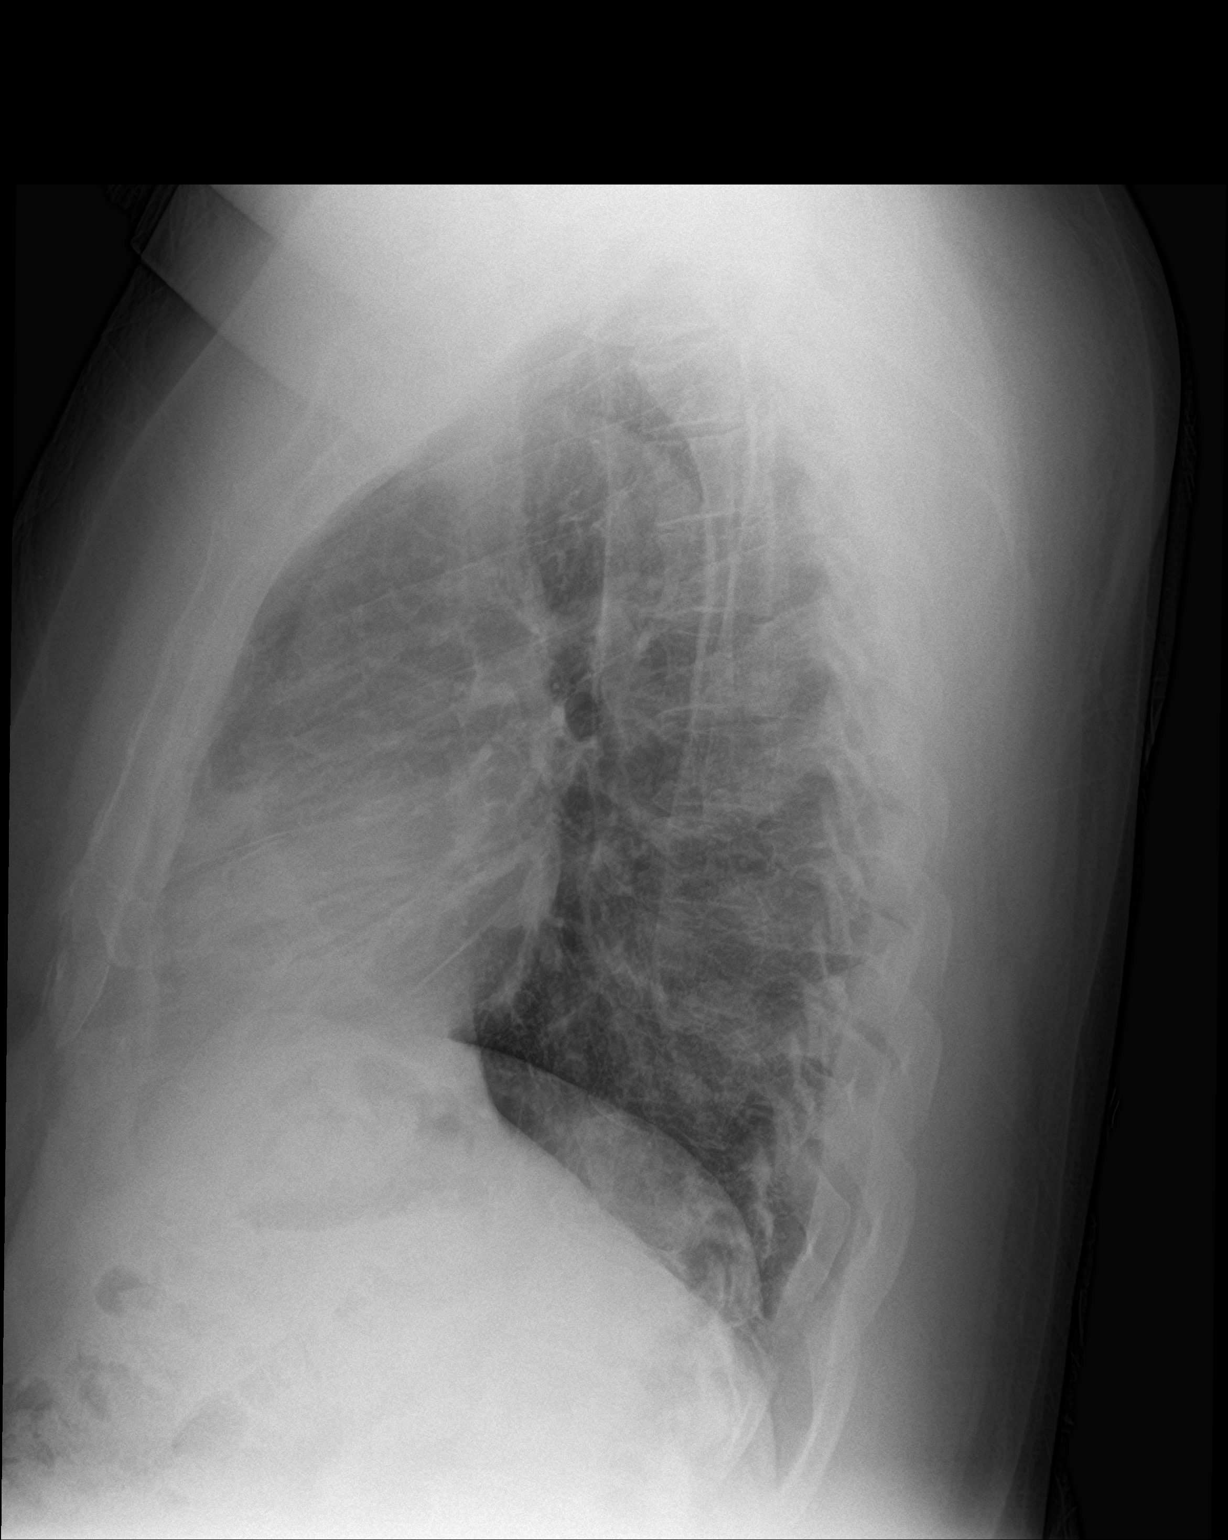

[2 of 2 positions shown; findings below may reference images not displayed]

FINDINGS: There is scarring in the left lower lobe region. There is
postoperative change in the right upper hemithorax with removal of a
portion of the right anterior first rib. There is no edema or
consolidation. Heart size and pulmonary vascularity are normal. No
adenopathy.
IMPRESSION: Postoperative change on the right with removal of a portion of the
right first rib. Chronic scarring left lower lobe. No edema or
consolidation. Stable cardiac silhouette.

## 2017-09-05 IMAGING — CT CT ABD-PELV W/ CM
3 of 11 series · 11 of 46 positions shown, 17 images · IV contrast (APPLIED)
Comparison: None.

CLINICAL DATA: Chest pain,

EXAM:
CT ANGIOGRAPHY CHEST
CT ABDOMEN AND PELVIS WITH CONTRAST
TECHNIQUE: Multidetector CT imaging of the chest was performed using the
standard protocol during bolus administration of intravenous
contrast. Multiplanar CT image reconstructions and MIPs were
obtained to evaluate the vascular anatomy. Multidetector CT imaging
of the abdomen and pelvis was performed using the standard protocol
during bolus administration of intravenous contrast.
CONTRAST:  100 mL of Isovue 370 intravenously.

[Series 5: thins · axial · 0.87mm/px · z∈[-501,-378]mm · 5 of 264 slices shown]
[im 18/264  soft-tissue]
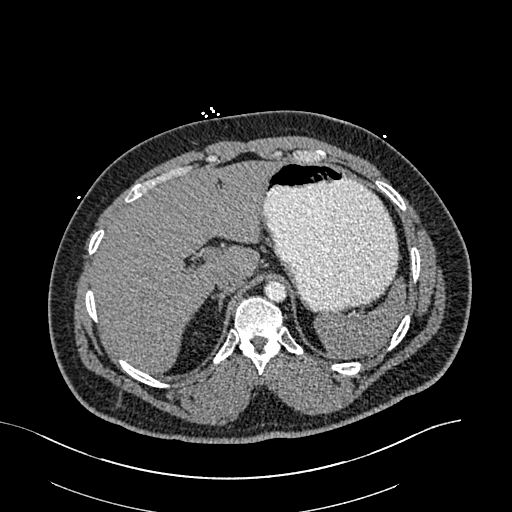
[im 53/264  soft-tissue]
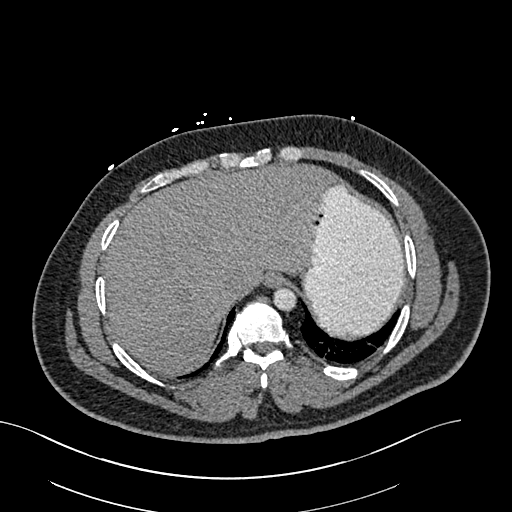
[im 88/264  soft-tissue]
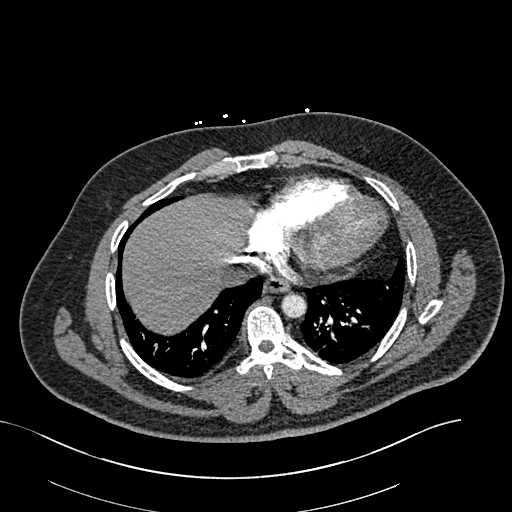
[im 123/264  soft-tissue]
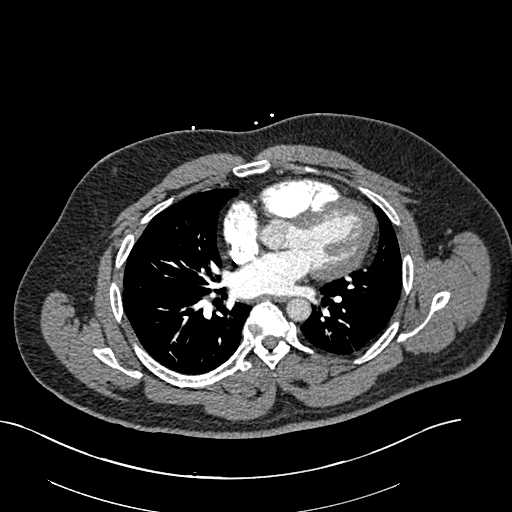
[im 141/264  soft-tissue]
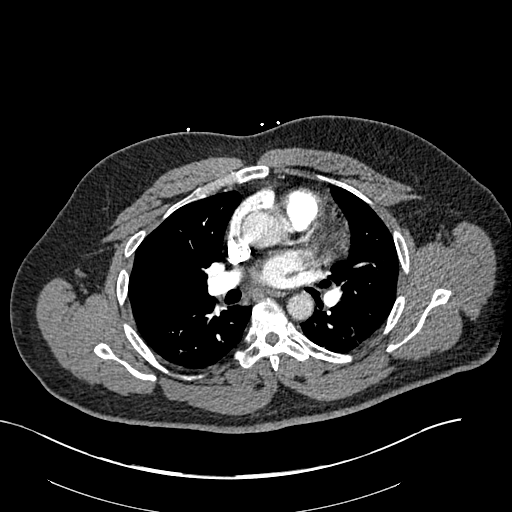

[Series 7: coronal mpr · coronal · 0.56mm/px · 1 of 137 slices shown, 2 images]
[im 69/137  soft-tissue]
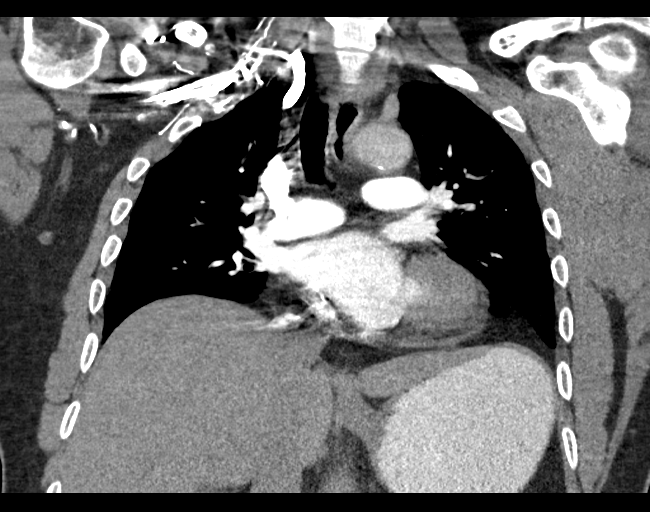
[im 69/137  bone]
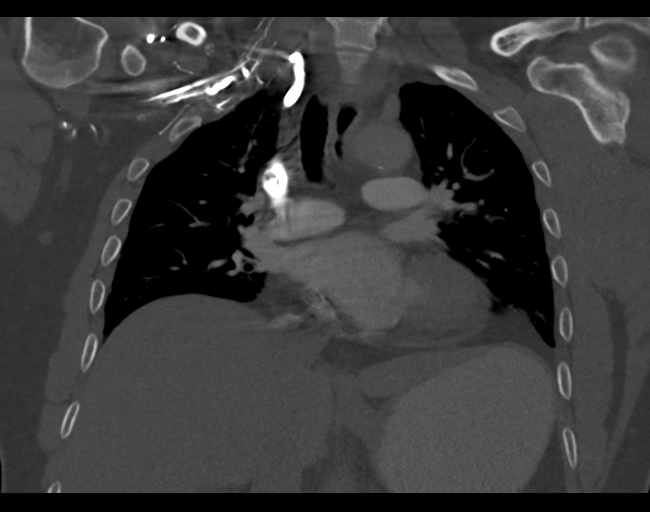

[Series 11: axial st · axial · 0.89mm/px · z∈[-834,-474]mm · 5 of 109 slices shown, 10 images]
[im 19/109  soft-tissue]
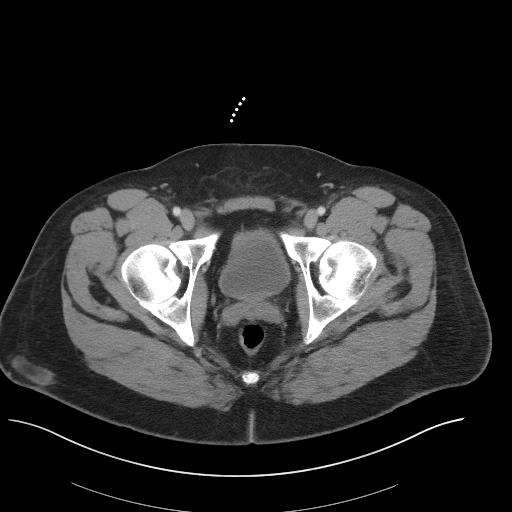
[im 19/109  bone]
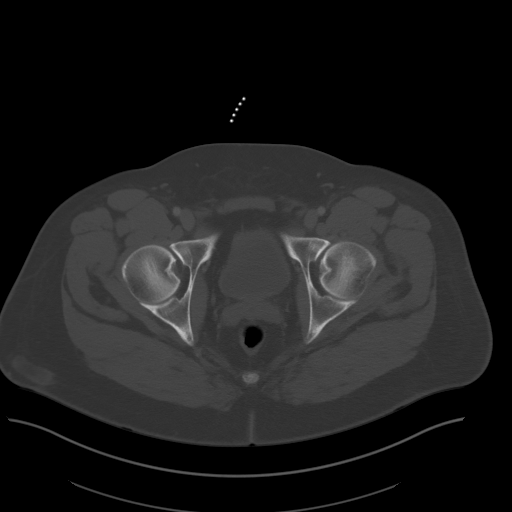
[im 37/109  soft-tissue]
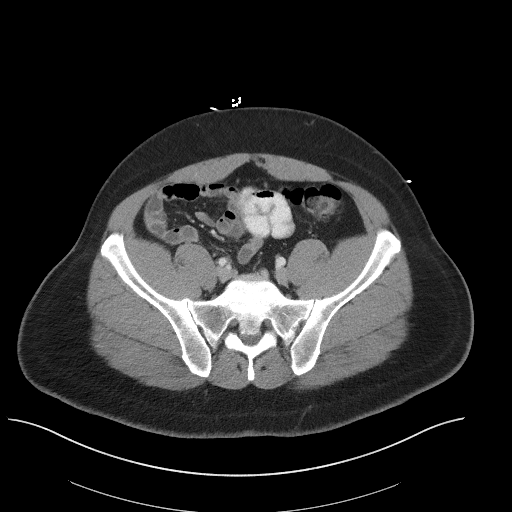
[im 37/109  lung]
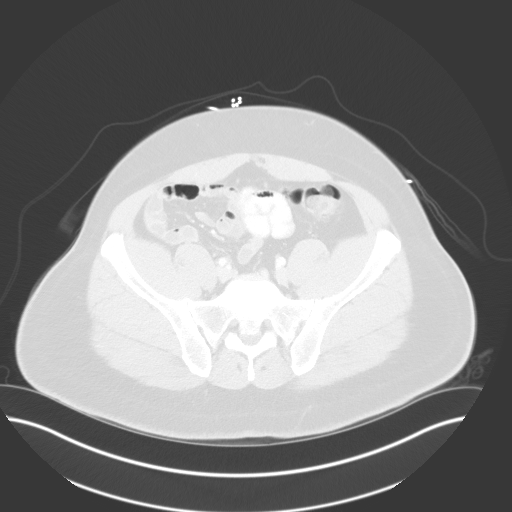
[im 55/109  soft-tissue]
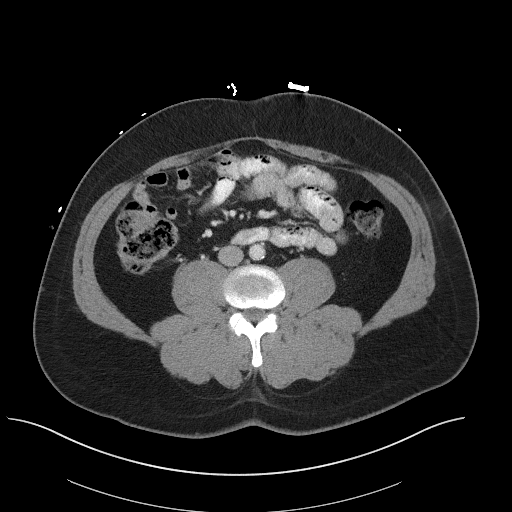
[im 55/109  lung]
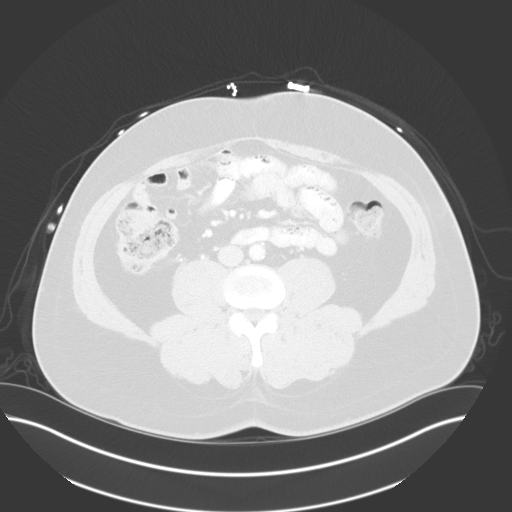
[im 73/109  soft-tissue]
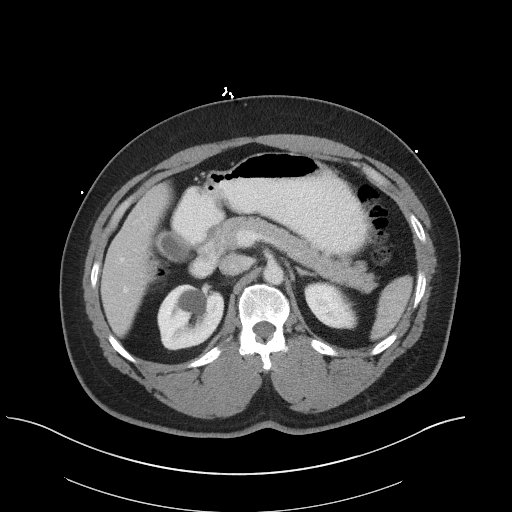
[im 73/109  lung]
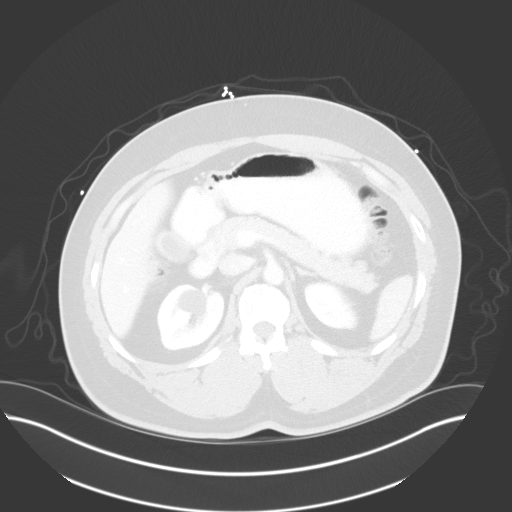
[im 91/109  soft-tissue]
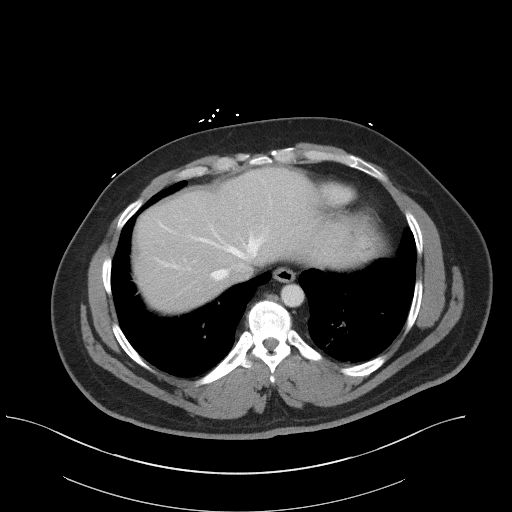
[im 91/109  lung]
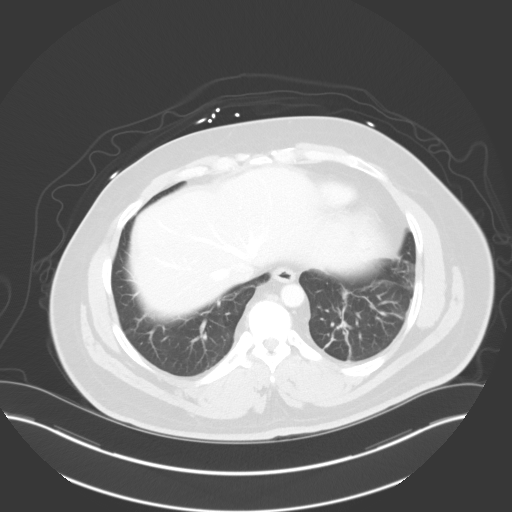

[11 of 46 positions shown; findings below may reference images not displayed]

FINDINGS: CTA CHEST FINDINGS

Cardiovascular: Satisfactory opacification of the pulmonary arteries
to the segmental level. No evidence of pulmonary embolism. Normal
heart size. No pericardial effusion.

Mediastinum/Nodes: No enlarged mediastinal, hilar, or axillary lymph
nodes. Thyroid gland, trachea, and esophagus demonstrate no
significant findings.

Lungs/Pleura: Lungs are clear. No pleural effusion or pneumothorax.

Musculoskeletal: No chest wall abnormality. No acute or significant
osseous findings.

Review of the MIP images confirms the above findings.

CT ABDOMEN and PELVIS FINDINGS

Hepatobiliary: No focal liver abnormality is seen. No gallstones,
gallbladder wall thickening, or biliary dilatation.

Pancreas: Unremarkable. No pancreatic ductal dilatation or
surrounding inflammatory changes.

Spleen: Normal in size without focal abnormality.

Adrenals/Urinary Tract: Adrenal glands are unremarkable. Kidneys are
normal, without renal calculi, focal lesion, or hydronephrosis.
Bladder is unremarkable.

Stomach/Bowel: Stomach is within normal limits. Appendix appears
normal. No evidence of bowel wall thickening, distention, or
inflammatory changes.

Vascular/Lymphatic: No significant vascular findings are present. No
enlarged abdominal or pelvic lymph nodes.

Reproductive: Prostate is unremarkable.

Other: No abnormal fluid collection is noted. Multiple foci of
abnormal density is noted in the subcutaneous tissues of the
anterior abdominal wall, particularly in the left lower quadrant,
which may represent contusions or injection sites.

Musculoskeletal: No acute or significant osseous findings.

Review of the MIP images confirms the above findings.
IMPRESSION: Findings consistent with injection sites or contusions involving the
subcutaneous tissues of the left lower quadrant of the abdomen. No
other abnormality seen in the abdomen or pelvis.

No evidence of pulmonary embolus. No definite abnormality seen in
the chest.

## 2017-09-08 ENCOUNTER — Ambulatory Visit (INDEPENDENT_AMBULATORY_CARE_PROVIDER_SITE_OTHER): Payer: 59 | Admitting: Family Medicine

## 2017-09-08 ENCOUNTER — Encounter: Payer: Self-pay | Admitting: Family Medicine

## 2017-09-08 VITALS — BP 106/70 | HR 92 | Temp 98.4°F | Ht 71.1 in | Wt 256.6 lb

## 2017-09-08 DIAGNOSIS — Z0000 Encounter for general adult medical examination without abnormal findings: Secondary | ICD-10-CM | POA: Diagnosis not present

## 2017-09-08 DIAGNOSIS — Z8042 Family history of malignant neoplasm of prostate: Secondary | ICD-10-CM | POA: Diagnosis not present

## 2017-09-08 LAB — MICROSCOPIC EXAMINATION
Bacteria, UA: NONE SEEN
WBC, UA: NONE SEEN /hpf (ref 0–5)

## 2017-09-08 LAB — UA/M W/RFLX CULTURE, ROUTINE
Bilirubin, UA: NEGATIVE
Glucose, UA: NEGATIVE
Ketones, UA: NEGATIVE
Leukocytes, UA: NEGATIVE
Nitrite, UA: NEGATIVE
Protein, UA: NEGATIVE
Specific Gravity, UA: 1.015 (ref 1.005–1.030)
Urobilinogen, Ur: 0.2 mg/dL (ref 0.2–1.0)
pH, UA: 6 (ref 5.0–7.5)

## 2017-09-08 NOTE — Progress Notes (Signed)
BP 106/70 (BP Location: Right Arm, Patient Position: Sitting, Cuff Size: Large)   Pulse 92   Temp 98.4 F (36.9 C) (Oral)   Ht 5' 11.1" (1.806 m)   Wt 256 lb 9.6 oz (116.4 kg)   SpO2 96%   BMI 35.69 kg/m    Subjective:    Patient ID: Justin [name], male    DOB: 1965/06/29, 52 y.o.   MRN: [id number]  HPI: Justin [name] is a 52 y.o. male presenting on 09/08/2017 for comprehensive medical examination. Current medical complaints include:none  He currently lives with: wife Interim Problems from his last visit: no  Depression Screen done today and results listed below:  Depression screen [facility] 2/9 09/08/2017 09/01/2017 08/22/2017 06/09/2017  Decreased Interest 0 0 0 0  Down, Depressed, Hopeless 0 0 0 0  PHQ - 2 Score 0 0 0 0  Altered sleeping 0 - - -  Tired, decreased energy 0 - - -  Change in appetite 0 - - -  Feeling bad or failure about yourself  0 - - -  Trouble concentrating 0 - - -  Moving slowly or fidgety/restless 0 - - -  Suicidal thoughts 0 - - -  PHQ-9 Score 0 - - -    The patient does not have a history of falls. I did not complete a risk assessment for falls. A plan of care for falls was not documented.   Past Medical History:  Past Medical History:  Diagnosis Date  . Arm vein blood clot    right  . DVT (deep venous thrombosis) ([place]) 2012; January 2016   History of right arm; now right lower shotty  . GERD (gastroesophageal reflux disease)   . Rib pain on right side 08/04/2016   RT rib pain that radiates around to the back. Level 8 of 10.  Constant. Started 1 mo ago, 1 week after DVT.  Marland Kitchen Vascular thoracic outlet syndrome    Right     Surgical History:  Past Surgical History:  Procedure Laterality Date  . CAROTID ANGIOGRAM    . KNEE CARTILAGE SURGERY     Performed for cracked patella and torn meniscus  . Right upper extremity thrombectomy  2012  . SCALENOTOMY W/O RESECTION CERVICAL RIB  2012   4 right-sided thoracic outlet syndrome    Medications:   Current Outpatient Medications on File Prior to Visit  Medication Sig  . Calcium Carbonate-Vit D-Min (GNP CALCIUM 1200) 1200-1000 MG-UNIT CHEW Chew 1,200 mg by mouth daily with breakfast. Take in combination with vitamin D and magnesium.  . Cholecalciferol (VITAMIN D3) 5000 units CAPS Take 1 capsule (5,000 Units total) by mouth daily with breakfast. Take along with calcium and magnesium.  Marland Kitchen enoxaparin (LOVENOX) 150 MG/ML injection INJECT 0.74 MLS (110 MG TOTAL) INTO THE SKIN 2 (TWO) TIMES DAILY.  . ergocalciferol (VITAMIN D2) 50000 units capsule Take 1 capsule (50,000 Units total) by mouth 2 (two) times a week. X 6 weeks.  . gabapentin (NEURONTIN) 300 MG capsule Take 1 capsule (300 mg total) by mouth 3 (three) times daily. Take 1 tab TID, can take an extra tab at bedtime (Patient taking differently: Take 300 mg by mouth 3 (three) times daily. Take 1 tab TID, can take an extra tab at bedtime)  . Magnesium 500 MG CAPS Take 1 capsule (500 mg total) by mouth 2 (two) times daily at 8 am and 10 pm.  . oxyCODONE (OXY IR/ROXICODONE) 5 MG immediate release tablet Take 1 tablet (  5 mg total) by mouth every 8 (eight) hours as needed for severe pain.   No current facility-administered medications on file prior to visit.     Allergies:  No Known Allergies  Social History:  Social History   Socioeconomic History  . Marital status: Married    Spouse name: Not on file  . Number of children: Not on file  . Years of education: Not on file  . Highest education level: Not on file  Occupational History    Comment: [employer]  Social Needs  . Financial resource strain: Not on file  . Food insecurity:    Worry: Not on file    Inability: Not on file  . Transportation needs:    Medical: Not on file    Non-medical: Not on file  Tobacco Use  . Smoking status: Former Smoker    Packs/day: 0.50  . Smokeless tobacco: Never Used  . Tobacco comment: quit smoking in 2010  Substance and Sexual Activity  . Alcohol  use: No  . Drug use: No  . Sexual activity: Not on file  Lifestyle  . Physical activity:    Days per week: Not on file    Minutes per session: Not on file  . Stress: Not on file  Relationships  . Social connections:    Talks on phone: Not on file    Gets together: Not on file    Attends religious service: Not on file    Active member of club or organization: Not on file    Attends meetings of clubs or organizations: Not on file    Relationship status: Not on file  . Intimate partner violence:    Fear of current or ex partner: Not on file    Emotionally abused: Not on file    Physically abused: Not on file    Forced sexual activity: Not on file  Other Topics Concern  . Not on file  Social History Narrative   He works full-time at [employer].    He is married. Nonsmoker who is not currently. Alcohol.   Social History   Tobacco Use  Smoking Status Former Smoker  . Packs/day: 0.50  Smokeless Tobacco Never Used  Tobacco Comment   quit smoking in 2010   Social History   Substance and Sexual Activity  Alcohol Use No    Family History:  Family History  Problem Relation Age of Onset  . Heart disease Father   . Breast cancer Father   . Diabetes Father   . Heart attack Father   . Cancer Maternal Grandmother   . Leukemia Maternal Grandfather   . Prostate cancer Maternal Uncle     Past medical history, surgical history, medications, allergies, family history and social history reviewed with patient today and changes made to appropriate areas of the chart.   Review of Systems - General ROS: negative Psychological ROS: negative Ophthalmic ROS: negative ENT ROS: negative Allergy and Immunology ROS: negative Hematological and Lymphatic ROS: negative Respiratory ROS: no cough, shortness of breath, or wheezing Cardiovascular ROS: no chest pain or dyspnea on exertion Gastrointestinal ROS: no abdominal pain, change in bowel habits, or black or bloody stools Genito-Urinary ROS:  no dysuria, trouble voiding, or hematuria Musculoskeletal ROS: positive for - right rib pain - chronic Neurological ROS: no TIA or stroke symptoms Dermatological ROS: negative All other ROS negative except what is listed above and in the HPI.      Objective:    BP 106/70 (BP Location: Right  Arm, Patient Position: Sitting, Cuff Size: Large)   Pulse 92   Temp 98.4 F (36.9 C) (Oral)   Ht 5' 11.1" (1.806 m)   Wt 256 lb 9.6 oz (116.4 kg)   SpO2 96%   BMI 35.69 kg/m   Wt Readings from Last 3 Encounters:  09/08/17 256 lb 9.6 oz (116.4 kg)  09/01/17 250 lb (113.4 kg)  08/22/17 249 lb (112.9 kg)    Physical Exam  Constitutional: He is oriented to person, place, and time. He appears well-developed and well-nourished. No distress.  HENT:  Head: Atraumatic.  Right Ear: External ear normal.  Left Ear: External ear normal.  Nose: Nose normal.  Mouth/Throat: Oropharynx is clear and moist.  Eyes: Pupils are equal, round, and reactive to light. Conjunctivae are normal. No scleral icterus.  Neck: Normal range of motion. Neck supple.  Cardiovascular: Normal rate, regular rhythm, normal heart sounds and intact distal pulses.  No murmur heard. Pulmonary/Chest: Effort normal and breath sounds normal. No respiratory distress.  Abdominal: Soft. Bowel sounds are normal. He exhibits no distension and no mass. There is no tenderness. There is no guarding.  Genitourinary:  Genitourinary Comments: Declined prostate exam  Musculoskeletal: Normal range of motion. He exhibits no edema or tenderness.  Neurological: He is alert and oriented to person, place, and time. He has normal reflexes.  Skin: Skin is warm and dry. No rash noted.  Psychiatric: He has a normal mood and affect. His behavior is normal.  Nursing note and vitals reviewed.   Results for orders placed or performed in visit on 09/08/17  Microscopic Examination  Result Value Ref Range   WBC, UA None seen 0 - 5 /hpf   RBC, UA 0-2 0 - 2  /hpf   Epithelial Cells (non renal) 0-10 0 - 10 /hpf   Bacteria, UA None seen None seen/Few  CBC with Differential/Platelet  Result Value Ref Range   WBC 11.1 (H) 3.4 - 10.8 x10E3/uL   RBC 4.87 4.14 - 5.80 x10E6/uL   Hemoglobin 14.7 13.0 - 17.7 g/dL   Hematocrit 44.3 37.5 - 51.0 %   MCV 91 79 - 97 fL   MCH 30.2 26.6 - 33.0 pg   MCHC 33.2 31.5 - 35.7 g/dL   RDW 15.7 (H) 12.3 - 15.4 %   Platelets 292 150 - 379 x10E3/uL   Neutrophils 66 Not Estab. %   Lymphs 21 Not Estab. %   Monocytes 10 Not Estab. %   Eos 1 Not Estab. %   Basos 0 Not Estab. %   Neutrophils Absolute 7.2 (H) 1.4 - 7.0 x10E3/uL   Lymphocytes Absolute 2.3 0.7 - 3.1 x10E3/uL   Monocytes Absolute 1.1 (H) 0.1 - 0.9 x10E3/uL   EOS (ABSOLUTE) 0.1 0.0 - 0.4 x10E3/uL   Basophils Absolute 0.0 0.0 - 0.2 x10E3/uL   Immature Granulocytes 2 Not Estab. %   Immature Grans (Abs) 0.2 (H) 0.0 - 0.1 x10E3/uL  Comprehensive metabolic panel  Result Value Ref Range   Glucose 102 (H) 65 - 99 mg/dL   BUN 11 6 - 24 mg/dL   Creatinine, Ser 1.26 0.76 - 1.27 mg/dL   GFR calc non Af Amer 65 >59 mL/min/1.73   GFR calc Af Amer 75 >59 mL/min/1.73   BUN/Creatinine Ratio 9 9 - 20   Sodium 139 134 - 144 mmol/L   Potassium 4.3 3.5 - 5.2 mmol/L   Chloride 98 96 - 106 mmol/L   CO2 22 20 - 29 mmol/L   Calcium  9.9 8.7 - 10.2 mg/dL   Total Protein 7.2 6.0 - 8.5 g/dL   Albumin 4.0 3.5 - 5.5 g/dL   Globulin, Total 3.2 1.5 - 4.5 g/dL   Albumin/Globulin Ratio 1.3 1.2 - 2.2   Bilirubin Total 0.2 0.0 - 1.2 mg/dL   Alkaline Phosphatase 101 39 - 117 IU/L   AST 20 0 - 40 IU/L   ALT 74 (H) 0 - 44 IU/L  Lipid Panel w/o Chol/HDL Ratio  Result Value Ref Range   Cholesterol, Total 262 (H) 100 - 199 mg/dL   Triglycerides 188 (H) 0 - 149 mg/dL   HDL 72 >39 mg/dL   VLDL Cholesterol Cal 38 5 - 40 mg/dL   LDL Calculated 152 (H) 0 - 99 mg/dL  UA/M w/rflx Culture, Routine  Result Value Ref Range   Specific Gravity, UA 1.015 1.005 - 1.030   pH, UA 6.0 5.0 -  7.5   Color, UA Yellow Yellow   Appearance Ur Clear Clear   Leukocytes, UA Negative Negative   Protein, UA Negative Negative/Trace   Glucose, UA Negative Negative   Ketones, UA Negative Negative   RBC, UA 1+ (A) Negative   Bilirubin, UA Negative Negative   Urobilinogen, Ur 0.2 0.2 - 1.0 mg/dL   Nitrite, UA Negative Negative   Microscopic Examination See below:   PSA  Result Value Ref Range   Prostate Specific Ag, Serum 0.5 0.0 - 4.0 ng/mL      Assessment & Plan:   Problem List Items Addressed This Visit    None    Visit Diagnoses    Annual physical exam    -  Primary   Labs drawn today, UTD on health maintenance. Follows with specialists for his heme/oncology and neurosurgey issues   Relevant Orders   CBC with Differential/Platelet (Completed)   Comprehensive metabolic panel (Completed)   Lipid Panel w/o Chol/HDL Ratio (Completed)   UA/M w/rflx Culture, Routine (Completed)   Family history of prostate cancer       Relevant Orders   PSA (Completed)       Discussed aspirin prophylaxis for myocardial infarction prevention and decision was it was not indicated  LABORATORY TESTING:  Health maintenance labs ordered today as discussed above.   The natural history of prostate cancer and ongoing controversy regarding screening and potential treatment outcomes of prostate cancer has been discussed with the patient. The meaning of a false positive PSA and a false negative PSA has been discussed. He indicates understanding of the limitations of this screening test and wishes to proceed with screening PSA testing.   IMMUNIZATIONS:   - Tdap: Tetanus vaccination status reviewed: last tetanus booster within 10 years. - Influenza: Postponed to flu season  PATIENT COUNSELING:    Sexuality: Discussed sexually transmitted diseases, partner selection, use of condoms, avoidance of unintended pregnancy  and contraceptive alternatives.   Advised to avoid cigarette smoking.  I discussed  with the patient that most people either abstain from alcohol or drink within safe limits (<=14/week and <=4 drinks/occasion for males, <=7/weeks and <= 3 drinks/occasion for females) and that the risk for alcohol disorders and other health effects rises proportionally with the number of drinks per week and how often a drinker exceeds daily limits.  Discussed cessation/primary prevention of drug use and availability of treatment for abuse.   Diet: Encouraged to adjust caloric intake to maintain  or achieve ideal body weight, to reduce intake of dietary saturated fat and total fat, to limit sodium intake  by avoiding high sodium foods and not adding table salt, and to maintain adequate dietary potassium and calcium preferably from fresh fruits, vegetables, and low-fat dairy products.    stressed the importance of regular exercise  Injury prevention: Discussed safety belts, safety helmets, smoke detector, smoking near bedding or upholstery.   Dental health: Discussed importance of regular tooth brushing, flossing, and dental visits.   Follow up plan: NEXT PREVENTATIVE PHYSICAL DUE IN 1 YEAR. Return in about 1 year (around 09/09/2018) for CPE.

## 2017-09-09 ENCOUNTER — Other Ambulatory Visit: Payer: Self-pay | Admitting: Family Medicine

## 2017-09-09 LAB — COMPREHENSIVE METABOLIC PANEL
ALT: 74 IU/L — ABNORMAL HIGH (ref 0–44)
AST: 20 IU/L (ref 0–40)
Albumin/Globulin Ratio: 1.3 (ref 1.2–2.2)
Albumin: 4 g/dL (ref 3.5–5.5)
Alkaline Phosphatase: 101 IU/L (ref 39–117)
BUN/Creatinine Ratio: 9 (ref 9–20)
BUN: 11 mg/dL (ref 6–24)
Bilirubin Total: 0.2 mg/dL (ref 0.0–1.2)
CO2: 22 mmol/L (ref 20–29)
Calcium: 9.9 mg/dL (ref 8.7–10.2)
Chloride: 98 mmol/L (ref 96–106)
Creatinine, Ser: 1.26 mg/dL (ref 0.76–1.27)
GFR calc Af Amer: 75 mL/min/{1.73_m2} (ref >59)
GFR calc non Af Amer: 65 mL/min/{1.73_m2} (ref >59)
Globulin, Total: 3.2 g/dL (ref 1.5–4.5)
Glucose: 102 mg/dL — ABNORMAL HIGH (ref 65–99)
Potassium: 4.3 mmol/L (ref 3.5–5.2)
Sodium: 139 mmol/L (ref 134–144)
Total Protein: 7.2 g/dL (ref 6.0–8.5)

## 2017-09-09 LAB — CBC WITH DIFFERENTIAL/PLATELET
Basophils Absolute: 0 10*3/uL (ref 0.0–0.2)
Basos: 0 %
EOS (ABSOLUTE): 0.1 10*3/uL (ref 0.0–0.4)
Eos: 1 %
Hematocrit: 44.3 % (ref 37.5–51.0)
Hemoglobin: 14.7 g/dL (ref 13.0–17.7)
Immature Grans (Abs): 0.2 10*3/uL — ABNORMAL HIGH (ref 0.0–0.1)
Immature Granulocytes: 2 %
Lymphocytes Absolute: 2.3 10*3/uL (ref 0.7–3.1)
Lymphs: 21 %
MCH: 30.2 pg (ref 26.6–33.0)
MCHC: 33.2 g/dL (ref 31.5–35.7)
MCV: 91 fL (ref 79–97)
Monocytes Absolute: 1.1 10*3/uL — ABNORMAL HIGH (ref 0.1–0.9)
Monocytes: 10 %
Neutrophils Absolute: 7.2 10*3/uL — ABNORMAL HIGH (ref 1.4–7.0)
Neutrophils: 66 %
Platelets: 292 10*3/uL (ref 150–379)
RBC: 4.87 x10E6/uL (ref 4.14–5.80)
RDW: 15.7 % — ABNORMAL HIGH (ref 12.3–15.4)
WBC: 11.1 10*3/uL — ABNORMAL HIGH (ref 3.4–10.8)

## 2017-09-09 LAB — LIPID PANEL W/O CHOL/HDL RATIO
Cholesterol, Total: 262 mg/dL — ABNORMAL HIGH (ref 100–199)
HDL: 72 mg/dL (ref >39)
LDL Calculated: 152 mg/dL — ABNORMAL HIGH (ref 0–99)
Triglycerides: 188 mg/dL — ABNORMAL HIGH (ref 0–149)
VLDL Cholesterol Cal: 38 mg/dL (ref 5–40)

## 2017-09-09 LAB — PSA: Prostate Specific Ag, Serum: 0.5 ng/mL (ref 0.0–4.0)

## 2017-09-09 MED ORDER — ATORVASTATIN CALCIUM 10 MG PO TABS: 10.0000 mg | ORAL_TABLET | Freq: Every day | ORAL | 1 refills | Status: DC

## 2017-09-11 NOTE — Patient Instructions (Signed)
Follow up for CPE 

## 2017-09-21 ENCOUNTER — Other Ambulatory Visit: Payer: Self-pay

## 2017-09-21 ENCOUNTER — Encounter: Payer: Self-pay | Admitting: Pain Medicine

## 2017-09-21 ENCOUNTER — Ambulatory Visit: Payer: 59 | Attending: Pain Medicine | Admitting: Pain Medicine

## 2017-09-21 VITALS — BP 120/82 | HR 84 | Temp 98.4°F | Resp 18 | Ht 71.5 in | Wt 250.0 lb

## 2017-09-21 DIAGNOSIS — G588 Other specified mononeuropathies: Secondary | ICD-10-CM | POA: Diagnosis not present

## 2017-09-21 DIAGNOSIS — M5442 Lumbago with sciatica, left side: Secondary | ICD-10-CM | POA: Insufficient documentation

## 2017-09-21 DIAGNOSIS — Z79891 Long term (current) use of opiate analgesic: Secondary | ICD-10-CM | POA: Diagnosis not present

## 2017-09-21 DIAGNOSIS — L932 Other local lupus erythematosus: Secondary | ICD-10-CM | POA: Insufficient documentation

## 2017-09-21 DIAGNOSIS — Z86718 Personal history of other venous thrombosis and embolism: Secondary | ICD-10-CM | POA: Insufficient documentation

## 2017-09-21 DIAGNOSIS — E559 Vitamin D deficiency, unspecified: Secondary | ICD-10-CM | POA: Diagnosis not present

## 2017-09-21 DIAGNOSIS — M546 Pain in thoracic spine: Secondary | ICD-10-CM | POA: Diagnosis not present

## 2017-09-21 DIAGNOSIS — M26601 Right temporomandibular joint disorder, unspecified: Secondary | ICD-10-CM | POA: Insufficient documentation

## 2017-09-21 DIAGNOSIS — Z79899 Other long term (current) drug therapy: Secondary | ICD-10-CM | POA: Diagnosis not present

## 2017-09-21 DIAGNOSIS — K219 Gastro-esophageal reflux disease without esophagitis: Secondary | ICD-10-CM | POA: Insufficient documentation

## 2017-09-21 DIAGNOSIS — Z87891 Personal history of nicotine dependence: Secondary | ICD-10-CM | POA: Diagnosis not present

## 2017-09-21 DIAGNOSIS — R1011 Right upper quadrant pain: Secondary | ICD-10-CM | POA: Insufficient documentation

## 2017-09-21 DIAGNOSIS — G8912 Acute post-thoracotomy pain: Secondary | ICD-10-CM | POA: Insufficient documentation

## 2017-09-21 DIAGNOSIS — M549 Dorsalgia, unspecified: Secondary | ICD-10-CM | POA: Diagnosis present

## 2017-09-21 DIAGNOSIS — G894 Chronic pain syndrome: Secondary | ICD-10-CM | POA: Diagnosis not present

## 2017-09-21 DIAGNOSIS — G8929 Other chronic pain: Secondary | ICD-10-CM

## 2017-09-21 DIAGNOSIS — Z7901 Long term (current) use of anticoagulants: Secondary | ICD-10-CM | POA: Diagnosis not present

## 2017-09-21 DIAGNOSIS — M5414 Radiculopathy, thoracic region: Secondary | ICD-10-CM | POA: Diagnosis not present

## 2017-09-21 MED ORDER — OXYCODONE HCL 5 MG PO TABS: 5.0000 mg | ORAL_TABLET | Freq: Three times a day (TID) | ORAL | 0 refills | Status: DC | PRN

## 2017-09-21 NOTE — Patient Instructions (Addendum)
Take OTC Magnesium 500 mg twice a day. (Natural muscle relaxant)___ You have been given one prescription for Oxycodone 5MG  to be filled. _DO NOT TAKE LOVONOX 24 HOURS BEFORE PROCEDURE. ________________________________________________________________________________________  Preparing for Procedure with Sedation  Instructions: . Oral Intake: Do not eat or drink anything for at least 8 hours prior to your procedure. . Transportation: Public transportation is not allowed. Bring an adult driver. The driver must be physically present in our waiting room before any procedure can be started. Marland Kitchen Physical Assistance: Bring an adult physically capable of assisting you, in the event you need help. This adult should keep you company at home for at least 6 hours after the procedure. . Blood Pressure Medicine: Take your blood pressure medicine with a sip of water the morning of the procedure. . Blood thinners:  . Diabetics on insulin: Notify the staff so that you can be scheduled 1st case in the morning. If your diabetes requires high dose insulin, take only  of your normal insulin dose the morning of the procedure and notify the staff that you have done so. . Preventing infections: Shower with an antibacterial soap the morning of your procedure. . Build-up your immune system: Take 1000 mg of Vitamin C with every meal (3 times a day) the day prior to your procedure. Marland Kitchen Antibiotics: Inform the staff if you have a condition or reason that requires you to take antibiotics before dental procedures. . Pregnancy: If you are pregnant, call and cancel the procedure. . Sickness: If you have a cold, fever, or any active infections, call and cancel the procedure. . Arrival: You must be in the facility at least 30 minutes prior to your scheduled procedure. . Children: Do not bring children with you. . Dress appropriately: Bring dark clothing that you would not mind if they get stained. . Valuables: Do not bring any jewelry  or valuables.  Procedure appointments are reserved for interventional treatments only. Marland Kitchen No Prescription Refills. . No medication changes will be discussed during procedure appointments. . No disability issues will be discussed.  Remember:  Regular Business hours are:  Monday to Thursday 8:00 AM to 4:00 PM  Provider's Schedule: Milinda Pointer, MD:  Procedure days: Tuesday and Thursday 7:30 AM to 4:00 PM  Gillis Santa, MD:  Procedure days: Monday and Wednesday 7:30 AM to 4:00 PM ____________________________________________________________________________________________   ____________________________________________________________________________________________  Pain Scale  Introduction: The pain score used by this practice is the Verbal Numerical Rating Scale (VNRS-11). This is an 11-point scale. It is for adults and children 10 years or older. There are significant differences in how the pain score is reported, used, and applied. Forget everything you learned in the past and learn this scoring system.  General Information: The scale should reflect your current level of pain. Unless you are specifically asked for the level of your worst pain, or your average pain. If you are asked for one of these two, then it should be understood that it is over the past 24 hours.  Basic Activities of Daily Living (ADL): Personal hygiene, dressing, eating, transferring, and using restroom.  Instructions: Most patients tend to report their level of pain as a combination of two factors, their physical pain and their psychosocial pain. This last one is also known as "suffering" and it is reflection of how physical pain affects you socially and psychologically. From now on, report them separately. From this point on, when asked to report your pain level, report only your physical pain. Use the  following table for reference.  Pain Clinic Pain Levels (0-5/10)  Pain Level Score  Description  No Pain  0   Mild pain 1 Nagging, annoying, but does not interfere with basic activities of daily living (ADL). Patients are able to eat, bathe, get dressed, toileting (being able to get on and off the toilet and perform personal hygiene functions), transfer (move in and out of bed or a chair without assistance), and maintain continence (able to control bladder and bowel functions). Blood pressure and heart rate are unaffected. A normal heart rate for a healthy adult ranges from 60 to 100 bpm (beats per minute).   Mild to moderate pain 2 Noticeable and distracting. Impossible to hide from other people. More frequent flare-ups. Still possible to adapt and function close to normal. It can be very annoying and may have occasional stronger flare-ups. With discipline, patients may get used to it and adapt.   Moderate pain 3 Interferes significantly with activities of daily living (ADL). It becomes difficult to feed, bathe, get dressed, get on and off the toilet or to perform personal hygiene functions. Difficult to get in and out of bed or a chair without assistance. Very distracting. With effort, it can be ignored when deeply involved in activities.   Moderately severe pain 4 Impossible to ignore for more than a few minutes. With effort, patients may still be able to manage work or participate in some social activities. Very difficult to concentrate. Signs of autonomic nervous system discharge are evident: dilated pupils (mydriasis); mild sweating (diaphoresis); sleep interference. Heart rate becomes elevated (>115 bpm). Diastolic blood pressure (lower number) rises above 100 mmHg. Patients find relief in laying down and not moving.   Severe pain 5 Intense and extremely unpleasant. Associated with frowning face and frequent crying. Pain overwhelms the senses.  Ability to do any activity or maintain social relationships becomes significantly limited. Conversation becomes difficult. Pacing back and forth is common, as  getting into a comfortable position is nearly impossible. Pain wakes you up from deep sleep. Physical signs will be obvious: pupillary dilation; increased sweating; goosebumps; brisk reflexes; cold, clammy hands and feet; nausea, vomiting or dry heaves; loss of appetite; significant sleep disturbance with inability to fall asleep or to remain asleep. When persistent, significant weight loss is observed due to the complete loss of appetite and sleep deprivation.  Blood pressure and heart rate becomes significantly elevated. Caution: If elevated blood pressure triggers a pounding headache associated with blurred vision, then the patient should immediately seek attention at an urgent or emergency care unit, as these may be signs of an impending stroke.    Emergency Department Pain Levels (6-10/10)  Emergency Room Pain 6 Severely limiting. Requires emergency care and should not be seen or managed at an outpatient pain management facility. Communication becomes difficult and requires great effort. Assistance to reach the emergency department may be required. Facial flushing and profuse sweating along with potentially dangerous increases in heart rate and blood pressure will be evident.   Distressing pain 7 Self-care is very difficult. Assistance is required to transport, or use restroom. Assistance to reach the emergency department will be required. Tasks requiring coordination, such as bathing and getting dressed become very difficult.   Disabling pain 8 Self-care is no longer possible. At this level, pain is disabling. The individual is unable to do even the most "basic" activities such as walking, eating, bathing, dressing, transferring to a bed, or toileting. Fine motor skills are lost. It is difficult  to think clearly.   Incapacitating pain 9 Pain becomes incapacitating. Thought processing is no longer possible. Difficult to remember your own name. Control of movement and coordination are lost.   The  worst pain imaginable 10 At this level, most patients pass out from pain. When this level is reached, collapse of the autonomic nervous system occurs, leading to a sudden drop in blood pressure and heart rate. This in turn results in a temporary and dramatic drop in blood flow to the brain, leading to a loss of consciousness. Fainting is one of the body's self defense mechanisms. Passing out puts the brain in a calmed state and causes it to shut down for a while, in order to begin the healing process.    Summary: 1. Refer to this scale when providing [place] with your pain level. 2. Be accurate and careful when reporting your pain level. This will help with your care. 3. Over-reporting your pain level will lead to loss of credibility. 4. Even a level of 1/10 means that there is pain and will be treated at our facility. 5. High, inaccurate reporting will be documented as "Symptom Exaggeration", leading to loss of credibility and suspicions of possible secondary gains such as obtaining more narcotics, or wanting to appear disabled, for fraudulent reasons. 6. Only pain levels of 5 or below will be seen at our facility. 7. Pain levels of 6 and above will be sent to the Emergency Department and the appointment cancelled. ____________________________________________________________________________________________

## 2017-09-21 NOTE — Progress Notes (Signed)
Patient's Name: Justin [name]  MRN: [id number]  Referring Provider: Valerie Roys, DO  DOB: 1966-03-05  PCP: Valerie Roys, DO  DOS: 09/21/2017  Note by: Gaspar Cola, MD  Service setting: Ambulatory outpatient  Specialty: Interventional Pain Management  Location: [facility] (AMB) Pain Management Facility    Patient type: Established   Primary Reason(s) for Visit: Encounter for post-procedure evaluation of chronic illness with mild to moderate exacerbation CC: Back Pain  HPI  Justin [name] is a 52 y.o. year old, male patient, who comes today for a post-procedure evaluation. He has Abnormal finding on EKG; Atypical chest pain; Left subclavian vein thrombosis ([place]); Pain of left scapula; Dyspnea; Lupus anticoagulant positive; DVT (deep venous thrombosis) ([place]); Thoracic outlet syndrome; TMJ tenderness, right; Left-sided low back pain with left-sided sciatica; Radiculopathy of thoracic region; Chronic thoracic back pain (Primary Area of Pain) (Right); Thoracic radiculitis (Secondary Area of Pain); Chronic pain syndrome; Long term current use of opiate analgesic; Long term current use of anticoagulant (Lovenox); Pharmacologic therapy; Disorder of skeletal system; Problems influencing health status; Rib pain on right side (Fourth Area of Pain); Right upper quadrant abdominal pain (Tertiary Area of Pain); Elevated C-reactive protein (CRP); Elevated sed rate; Vitamin D deficiency; 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 His primarily concern today is the Back Pain  Pain Assessment: Location: Right, Mid Back Radiating: Denies Onset: More than a month ago Duration: Chronic pain Quality: Aching, Throbbing, Pressure Severity: 7 /10 (self-reported pain score)  Note: Reported level is inconsistent with clinical observations. Clinically the patient looks like a 2/10  A 2/10 is viewed as "Mild to Moderate" and described as noticeable and distracting. Impossible to hide from other people. More frequent flare-ups. Still possible to adapt and function close to normal. It can be very annoying and may have occasional stronger flare-ups. With discipline, patients may get used to it and adapt. Information on the proper use of the pain scale provided to the patient today. When using our objective Pain Scale, levels between 6 and 10/10 are said to belong in an emergency room, as it progressively worsens from a 6/10, described as severely limiting, requiring emergency care not usually available at an outpatient pain management facility. At a 6/10 level, communication becomes difficult and requires great effort. Assistance to reach the emergency department may be required. Facial flushing and profuse sweating along with potentially dangerous increases in heart rate and blood pressure will be evident. Effect on ADL: Difficult to lay on right side  Timing: Constant Modifying factors: Medications; heat   Justin [name] comes in today for post-procedure evaluation after the treatment done on 09/01/2017.  Further details on both, my assessment(s), as well as the proposed treatment plan, please see below.  Post-Procedure Assessment  09/01/2017 Procedure: Diagnostic right-sided T6 and T7 intercostal nerve blocks #1 under fluoroscopic guidance, no sedation Pre-procedure pain score:  7/10 Post-procedure pain score: 0/10 (100% relief) Influential Factors: BMI: 34.38 kg/m Intra-procedural challenges: None observed.         Assessment challenges: None detected.              Reported side-effects: None.        Post-procedural adverse reactions or complications: None reported         Sedation: No sedation used. When no sedatives are used, the analgesic levels obtained are directly associated to the effectiveness of the local anesthetics.  However, when sedation is provided, the level of  analgesia obtained during the initial 1 hour following the intervention, is believed to be the result of a combination of factors. These factors may include, but are not limited to: 1. The effectiveness of the local anesthetics used. 2. The effects of the analgesic(s) and/or anxiolytic(s) used. 3. The degree of discomfort experienced by the patient at the time of the procedure. 4. The patients ability and reliability in recalling and recording the events. 5. The presence and influence of possible secondary gains and/or psychosocial factors. Reported result: Relief experienced during the 1st hour after the procedure: 100 % (Ultra-Short Term Relief) Justin [name] has indicated area to have been numb during this time. Interpretative annotation: Clinically appropriate result. No IV Analgesic or Anxiolytic given, therefore benefits are completely due to Local Anesthetic effects.          Effects of local anesthetic: The analgesic effects attained during this period are directly associated to the localized infiltration of local anesthetics and therefore cary significant diagnostic value as to the etiological location, or anatomical origin, of the pain. Expected duration of relief is directly dependent on the pharmacodynamics of the local anesthetic used. Long-acting (4-6 hours) anesthetics used.  Reported result: Relief during the next 4 to 6 hour after the procedure: 40 % (Short-Term Relief)            Interpretative annotation: Clinically possible results. Analgesia during this period is likely to be Local Anesthetic-related.          Long-term benefit: Defined as the period of time past the expected duration of local anesthetics (1 hour for short-acting and 4-6 hours for long-acting). With the possible exception of prolonged sympathetic blockade from the local anesthetics, benefits during this period are typically attributed to, or associated with, other factors such as analgesic sensory neuropraxia,  antiinflammatory effects, or beneficial biochemical changes provided by agents other than the local anesthetics.  Reported result: Extended relief following procedure: 0 %(Upper right back rib area 70% relief; lower right rib area 0% relief) (Long-Term Relief)            Interpretative annotation: Clinically possible results. Recurrence of symptoms. Incomplete therapeutic success. Limited inflammation. Possible mechanical aggravating factors.          Current benefits: Defined as reported results that persistent at this point in time.   Analgesia: 50-75 %            Function: Somewhat improved ROM: Somewhat improved Interpretative annotation: Partial relief. Therapeutic benefit observed. Effective therapeutic approach. The patient indicates that the blocked care of the upper portion of his pain but he missed some areas in the lower flank region.  Evaluation today with suggest that we missed the area of T9 and T8, and perhaps T10.  Interpretation: Results would suggest a successful diagnostic intervention.                  Plan:  Consider diagnostic procedure No.: 2 I will have the patient return for a repeat procedure, this time blocking the T6, T7, T8, and T9 levels.  If he gets complete relief of the pain then we will no exactly the areas that are affected.  In addition, we will see if his benefit is better than what he already had but if it does not, then we will consider RFA.  Laboratory Chemistry  Inflammation Markers (CRP: Acute Phase) (ESR: Chronic Phase) Lab Results  Component Value Date   CRP 11.4 (H) 07/28/2017  ESRSEDRATE 40 (H) 07/28/2017   LATICACIDVEN 0.9 07/20/2016                         Rheumatology Markers Lab Results  Component Value Date   ANA Negative 07/28/2017                        Renal Function Markers Lab Results  Component Value Date   BUN 11 09/08/2017   CREATININE 1.26 09/08/2017   GFRAA 75 09/08/2017   GFRNONAA 65 09/08/2017                               Hepatic Function Markers Lab Results  Component Value Date   AST 20 09/08/2017   ALT 74 (H) 09/08/2017   ALBUMIN 4.0 09/08/2017   ALKPHOS 101 09/08/2017   LIPASE 90 03/02/2014                        Electrolytes Lab Results  Component Value Date   NA 139 09/08/2017   K 4.3 09/08/2017   CL 98 09/08/2017   CALCIUM 9.9 09/08/2017   MG 2.0 07/28/2017                        Neuropathy Markers Lab Results  Component Value Date   VITAMINB12 391 07/28/2017                        Bone Pathology Markers Lab Results  Component Value Date   25OHVITD1 10 (L) 07/28/2017   25OHVITD2 3.2 07/28/2017   25OHVITD3 6.8 07/28/2017                         Coagulation Parameters Lab Results  Component Value Date   INR 1.03 07/20/2016   LABPROT 13.5 07/20/2016   APTT 45 (H) 07/20/2016   PLT 292 09/08/2017                        Cardiovascular Markers Lab Results  Component Value Date   BNP 18.0 07/20/2016   TROPONINI <0.03 12/27/2016   HGB 14.7 09/08/2017   HCT 44.3 09/08/2017                         Note: Lab results reviewed.  Recent Diagnostic Imaging Results  DG C-Arm 1-60 Min-No Report Fluoroscopy was utilized by the requesting physician.  No radiographic  interpretation.   Complexity Note: I personally reviewed the fluoroscopic imaging of the procedure.                        Meds   Current Outpatient Medications:  .  atorvastatin (LIPITOR) 10 MG tablet, Take 1 tablet (10 mg total) by mouth daily., Disp: 90 tablet, Rfl: 1 .  Calcium Carbonate-Vit D-Min (GNP CALCIUM 1200) 1200-1000 MG-UNIT CHEW, Chew 1,200 mg by mouth daily with breakfast. Take in combination with vitamin D and magnesium., Disp: 30 tablet, Rfl: 5 .  Cholecalciferol (VITAMIN D3) 5000 units CAPS, Take 1 capsule (5,000 Units total) by mouth daily with breakfast. Take along with calcium and magnesium., Disp: 30 capsule, Rfl: 5 .  enoxaparin (LOVENOX) 150 MG/ML injection, INJECT 0.74 MLS (110 MG  TOTAL) INTO THE SKIN  2 (TWO) TIMES DAILY., Disp: 60 Syringe, Rfl: 0 .  ergocalciferol (VITAMIN D2) 50000 units capsule, Take 1 capsule (50,000 Units total) by mouth 2 (two) times a week. X 6 weeks., Disp: 12 capsule, Rfl: 0 .  gabapentin (NEURONTIN) 300 MG capsule, Take 1 capsule (300 mg total) by mouth 3 (three) times daily. Take 1 tab TID, can take an extra tab at bedtime (Patient taking differently: Take 300 mg by mouth 3 (three) times daily. Take 1 tab TID, can take an extra tab at bedtime), Disp: 120 capsule, Rfl: 1 .  [START ON 09/28/2017] oxyCODONE (OXY IR/ROXICODONE) 5 MG immediate release tablet, Take 1 tablet (5 mg total) by mouth every 8 (eight) hours as needed for severe pain., Disp: 90 tablet, Rfl: 0 .  Magnesium 500 MG CAPS, Take 1 capsule (500 mg total) by mouth 2 (two) times daily at 8 am and 10 pm. (Patient not taking: Reported on 09/21/2017), Disp: 60 capsule, Rfl: 5  ROS  Constitutional: Denies any fever or chills Gastrointestinal: No reported hemesis, hematochezia, vomiting, or acute GI distress Musculoskeletal: Denies any acute onset joint swelling, redness, loss of ROM, or weakness Neurological: No reported episodes of acute onset apraxia, aphasia, dysarthria, agnosia, amnesia, paralysis, loss of coordination, or loss of consciousness  Allergies  Justin [name] has No Known Allergies.  [place]  Drug: Justin [name]  reports that he does not use drugs. Alcohol:  reports that he does not drink alcohol. Tobacco:  reports that he has quit smoking. He smoked 0.50 packs per day. He has never used smokeless tobacco. Medical:  has a past medical history of Arm vein blood clot, DVT (deep venous thrombosis) ([place]) (2012; January 2016), GERD (gastroesophageal reflux disease), Rib pain on right side (08/04/2016), and Vascular thoracic outlet syndrome. Surgical: Justin [name]  has a past surgical history that includes Knee cartilage surgery; Carotid angiogram; Scalenotomy w/o resection cervical rib  (2012); and Right upper extremity thrombectomy (2012). Family: family history includes Breast cancer in his father; Cancer in his maternal grandmother; Diabetes in his father; Heart attack in his father; Heart disease in his father; Leukemia in his maternal grandfather; Prostate cancer in his maternal uncle.  Constitutional Exam  General appearance: Well nourished, well developed, and well hydrated. In no apparent acute distress Vitals:   09/21/17 1355 09/21/17 1357  BP:  120/82  Pulse:  84  Resp: 18 18  Temp:  98.4 F (36.9 C)  SpO2:  97%  Weight:  250 lb (113.4 kg)  Height:  5' 11.5" (1.816 m)   BMI Assessment: Estimated body mass index is 34.38 kg/m as calculated from the following:   Height as of this encounter: 5' 11.5" (1.816 m).   Weight as of this encounter: 250 lb (113.4 kg).  BMI interpretation table: BMI level Category Range association with higher incidence of chronic pain  <18 kg/m2 Underweight   18.5-24.9 kg/m2 Ideal body weight   25-29.9 kg/m2 Overweight Increased incidence by 20%  30-34.9 kg/m2 Obese (Class I) Increased incidence by 68%  35-39.9 kg/m2 Severe obesity (Class II) Increased incidence by 136%  >40 kg/m2 Extreme obesity (Class III) Increased incidence by 254%   BMI Readings from Last 4 Encounters:  09/21/17 34.38 kg/m  09/08/17 35.69 kg/m  09/01/17 34.87 kg/m  08/22/17 34.24 kg/m   Wt Readings from Last 4 Encounters:  09/21/17 250 lb (113.4 kg)  09/08/17 256 lb 9.6 oz (116.4 kg)  09/01/17 250 lb (113.4 kg)  08/22/17 249 lb (112.9 kg)  Psych/Mental  status: Alert, oriented x 3 (person, place, & time)       Eyes: PERLA Respiratory: No evidence of acute respiratory distress  Cervical Spine Area Exam  Skin & Axial Inspection: No masses, redness, edema, swelling, or associated skin lesions Alignment: Symmetrical Functional ROM: Unrestricted ROM      Stability: No instability detected Muscle Tone/Strength: Functionally intact. No obvious  neuro-muscular anomalies detected. Sensory (Neurological): Unimpaired Palpation: No palpable anomalies              Upper Extremity (UE) Exam    Side: Right upper extremity  Side: Left upper extremity  Skin & Extremity Inspection: Skin color, temperature, and hair growth are WNL. No peripheral edema or cyanosis. No masses, redness, swelling, asymmetry, or associated skin lesions. No contractures.  Skin & Extremity Inspection: Skin color, temperature, and hair growth are WNL. No peripheral edema or cyanosis. No masses, redness, swelling, asymmetry, or associated skin lesions. No contractures.  Functional ROM: Unrestricted ROM          Functional ROM: Unrestricted ROM          Muscle Tone/Strength: Functionally intact. No obvious neuro-muscular anomalies detected.  Muscle Tone/Strength: Functionally intact. No obvious neuro-muscular anomalies detected.  Sensory (Neurological): Unimpaired          Sensory (Neurological): Unimpaired          Palpation: No palpable anomalies              Palpation: No palpable anomalies              Specialized Test(s): Deferred         Specialized Test(s): Deferred          Thoracic Spine Area Exam  Skin & Axial Inspection: No masses, redness, or swelling Alignment: Symmetrical Functional ROM: Unrestricted ROM Stability: No instability detected Muscle Tone/Strength: Functionally intact. No obvious neuro-muscular anomalies detected. Sensory (Neurological): Unimpaired Muscle strength & Tone: No palpable anomalies  Lumbar Spine Area Exam  Skin & Axial Inspection: No masses, redness, or swelling Alignment: Symmetrical Functional ROM: Unrestricted ROM       Stability: No instability detected Muscle Tone/Strength: Functionally intact. No obvious neuro-muscular anomalies detected. Sensory (Neurological): Unimpaired Palpation: No palpable anomalies       Provocative Tests: Lumbar Hyperextension and rotation test: evaluation deferred today       Lumbar Lateral  bending test: evaluation deferred today       Patrick's Maneuver: evaluation deferred today                    Gait & Posture Assessment  Ambulation: Unassisted Gait: Relatively normal for age and body habitus Posture: WNL   Lower Extremity Exam    Side: Right lower extremity  Side: Left lower extremity  Skin & Extremity Inspection: Skin color, temperature, and hair growth are WNL. No peripheral edema or cyanosis. No masses, redness, swelling, asymmetry, or associated skin lesions. No contractures.  Skin & Extremity Inspection: Skin color, temperature, and hair growth are WNL. No peripheral edema or cyanosis. No masses, redness, swelling, asymmetry, or associated skin lesions. No contractures.  Functional ROM: Unrestricted ROM          Functional ROM: Unrestricted ROM          Muscle Tone/Strength: Functionally intact. No obvious neuro-muscular anomalies detected.  Muscle Tone/Strength: Functionally intact. No obvious neuro-muscular anomalies detected.  Sensory (Neurological): Unimpaired  Sensory (Neurological): Unimpaired  Palpation: No palpable anomalies  Palpation: No palpable anomalies   Assessment  Primary Diagnosis & Pertinent Problem List: The primary encounter diagnosis was Intercostal neuralgia (T6, T7) (Right). 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  Status Diagnosis  Controlled Controlled Controlled 1. Intercostal neuralgia (T6, T7) (Right)   2. Chronic thoracic back pain (Primary Area of Pain) (Right)   3. 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 Chronic pain syndrome   5. Long term current use of anticoagulant (Lovenox)   6. Vitamin D deficiency     Problems updated and reviewed during this visit: No problems  updated. Plan of Care  Pharmacotherapy (Medications Ordered): Meds ordered this encounter  Medications  . oxyCODONE (OXY IR/ROXICODONE) 5 MG immediate release tablet    Sig: Take 1 tablet (5 mg total) by mouth every 8 (eight) hours as needed for severe pain.    Dispense:  90 tablet    Refill:  0    Do not place this medication, or any other prescription from our practice, on "Automatic Refill". Patient may have prescription filled one day early if pharmacy is closed on scheduled refill date. Do not fill until: 09/28/17 To last until: 10/28/17   Medications administered today: [name] had no medications administered during this visit.   Procedure Orders     INTERCOSTAL NERVE BLOCK Lab Orders  No laboratory test(s) ordered today   Imaging Orders  No imaging studies ordered today   Referral Orders  No referral(s) requested today    Interventional management options: Planned, scheduled, and/or pending:   Diagnostic right-sided T6, T7, T8, & T9 intercostal nerve blocks #2    Considering:   Diagnostic right-sided T6, T7 intercostal nerve blocks #2  Possible right-sided intercostal RFA  Diagnostic right-sided thoracic SNRB  Diagnosticright-sided thoracic facet medial nerve block  Possible right sided thoracic facet medial branch RFA    Palliative PRN treatment(s):   None at this time   Provider-requested follow-up: Return for Procedure (w/ sedation): (R) T6-T9 Intercostal NB, (Blood-thinner Protocol).  Future Appointments  Date Time Provider [place]  09/29/2017 10:15 AM [facility] None  09/29/2017 10:45 AM Lequita Asal, MD [facility] None  09/14/2018  1:00 PM Volney American, PA-C [facility]   Primary Care Physician: Valerie Roys, DO Location: [facility] Out[facility] Note by: Gaspar Cola, MD Date: 09/21/2017; Time: 3:27 PM

## 2017-09-21 NOTE — Progress Notes (Signed)
Nursing Pain Medication Assessment:  Safety precautions to be maintained throughout the outpatient stay will include: orient to surroundings, keep bed in low position, maintain call bell within reach at all times, provide assistance with transfer out of bed and ambulation.  Medication Inspection Compliance: Pill count conducted under aseptic conditions, in front of the patient. Neither the pills nor the bottle was removed from the patient's sight at any time. Once count was completed pills were immediately returned to the patient in their original bottle.  Medication: Oxycodone IR Pill/Patch Count: 22 of 90 pills remain Pill/Patch Appearance: Markings consistent with prescribed medication Bottle Appearance: Standard pharmacy container. Clearly labeled. Filled Date: 03 / 18 / 2018   Last Medication intake:  TodaySafety precautions to be maintained throughout the outpatient stay will include: orient to surroundings, keep bed in low position, maintain call bell within reach at all times, provide assistance with transfer out of bed and ambulation.

## 2017-09-28 NOTE — Progress Notes (Signed)
Justin [name] day:  09/29/2017   Chief Complaint: Justin [name] is a 52 y.o. male with recurrent thrombosis who is seen for 3 month assessment on Lovenox.    HPI:  The patient was last seen in the hematology [name] on 07/01/2017.  At that time, patient complained of ongoing postoperative rib pain.  Lung discomfort had improved to the point where patient could cough.  He noted some abdominal discomfort.  Patient was no longer using a walker for ambulation.  He continued on supplemental oxygen at home.  He continued anticoagulation using enoxaparin injections twice daily.  Exam was stable.  Patient was seen in consult by Justin David, NP on 07/28/2017.(pain management).  Pain medications were deferred to PCP until pain management officially assumed control of patient's pain management.  Patient was seen on 08/22/2017 by Dr. Milinda [name], at which time control of patient's pain management was officially accepted.  Patient was prescribed Roxicodone 5 mg every 8 hours as needed. Patient has undergone 2 intercostal nerve blocks (last 09/01/2017).  Patient is scheduled for another multilevel (T6-T9) intercostal nerve block procedure.  In the interim, patient has been doing "better" following his intercostal nerve block. He was seen in post-operative follow up "about a month ago". Ribs healing slowly. He advises that surgeon told him that it could take up to two years before his pain is relieved. Pain is rated 6/10 at home. Patient has PRN medications at home to use. Patient is no longer working. He states, "They took my job away from me because I have been out too long".   Patient denies shortness of breath. He notes that he is no longer having to use supplemental oxygen at home. He states, "I have had them come pick up the oxygen". Patient continues to have bruising due to his enoxaparin therapy.   Patient is eating well. Weight is up 12 pounds.   Patient  has a strong familial history of cancer. There has been no genetic testing performed in the past. Father has a history of breast cancer. Maternal grandfather had leukemia. Maternal grandmother had an unknown cancer. Maternal uncle had prostate cancer.   Past Medical History:  Diagnosis Date  . Arm vein blood clot    right  . DVT (deep venous thrombosis) ([facility]) 2012; January 2016   History of right arm; now right lower shotty  . GERD (gastroesophageal reflux disease)   . Rib pain on right side 08/04/2016   RT rib pain that radiates around to the back. Level 8 of 10.  Constant. Started 1 mo ago, 1 week after DVT.  [facility] V[facility] thoracic outlet syndrome    Right     Past Surgical History:  Procedure Laterality Date  . CAROTID ANGIOGRAM    . KNEE CARTILAGE SURGERY     Performed for cracked patella and torn meniscus  . Right upper extremity thrombectomy  2012  . SCALENOTOMY W/O RESECTION CERVICAL RIB  2012   4 right-sided thoracic outlet syndrome    Family History  Problem Relation Age of Onset  . Heart disease Father   . Breast cancer Father   . Diabetes Father   . Heart attack Father   . Cancer Maternal Grandmother   . Leukemia Maternal Grandfather   . Prostate cancer Maternal Uncle     Social History:  reports that he has quit smoking. He smoked 0.50 packs per day. He has never used smokeless tobacco. He reports that he  does not drink alcohol or use drugs.  He previously smoked 1/2 pack a day for 10 years.  He stopped smoking in his 88s.  He lives in [place].  He is a Glass blower/designer.  He denies any exposure to radiation or toxins.  He has a son.  The patient is accompanied by his wife, Justin [name], today.  Allergies: No Known Allergies  Current Medications: Current Outpatient Medications  Medication Sig Dispense Refill  . atorvastatin (LIPITOR) 10 MG tablet Take 1 tablet (10 mg total) by mouth daily. 90 tablet 1  . Calcium Carbonate-Vit D-Min (GNP CALCIUM 1200) 1200-1000 MG-UNIT CHEW  Chew 1,200 mg by mouth daily with breakfast. Take in combination with vitamin D and magnesium. 30 tablet 5  . Cholecalciferol (VITAMIN D3) 5000 units CAPS Take 1 capsule (5,000 Units total) by mouth daily with breakfast. Take along with calcium and magnesium. 30 capsule 5  . enoxaparin (LOVENOX) 150 MG/ML injection INJECT 0.74 MLS (110 MG TOTAL) INTO THE SKIN 2 (TWO) TIMES DAILY. 60 Syringe 0  . ergocalciferol (VITAMIN D2) 50000 units capsule Take 1 capsule (50,000 Units total) by mouth 2 (two) times a week. X 6 weeks. 12 capsule 0  . gabapentin (NEURONTIN) 300 MG capsule Take 1 capsule (300 mg total) by mouth 3 (three) times daily. Take 1 tab TID, can take an extra tab at bedtime (Patient taking differently: Take 300 mg by mouth 3 (three) times daily. Take 1 tab TID, can take an extra tab at bedtime) 120 capsule 1  . Magnesium 500 MG CAPS Take 1 capsule (500 mg total) by mouth 2 (two) times daily at 8 am and 10 pm. 60 capsule 5  . oxyCODONE (OXY IR/ROXICODONE) 5 MG immediate release tablet Take 1 tablet (5 mg total) by mouth every 8 (eight) hours as needed for severe pain. 90 tablet 0   No current facility-administered medications for this visit.     Review of Systems:  GENERAL:  Feels better.  Rib pain persists (see HPI).  No fevers, sweats or weight loss.  Weight up 12 pounds. PERFORMANCE STATUS (ECOG):  1 HEENT:  No visual changes, runny nose, sore throat, mouth sores or tenderness. Lungs: No shortness of breath.  No supplemental oxygen needed.  No cough.  No hemoptysis. Cardiac:  No chest pain, palpitations, orthopnea, or PND. GI:  No nausea, vomiting, diarrhea, constipation, melena or hematochezia. GU:  No urgency, frequency, dysuria, or hematuria. Musculoskeletal:  Post-operative rib pain.  No back pain.  No joint pain.  No muscle tenderness. Extremities:  No pain or swelling. Skin:  Bruising at Lovenox injection sites (rotates).  No rashes or skin changes. Neuro:  No headache, numbness  or weakness, balance or coordination issues. Endocrine:  No diabetes, thyroid issues, hot flashes or night sweats. Psych:  No mood changes, depression or anxiety. Pain:  Right sided rib pain (6 out of 10). Review of systems:  All other systems reviewed and found to be negative.  Physical Exam: Blood pressure 118/82, pulse 73, temperature (!) 97 F (36.1 C), temperature source Tympanic, resp. rate 18, weight 262 lb 12.8 oz (119.2 kg). GENERAL:  Well developed, well nourished, muscular gentleman sitting comfortably in the exam room in no acute distress. MENTAL STATUS:  Alert and oriented to person, place and time. HEAD:  Wearing a blue [facility] cap.  [name].  Normocephalic, atraumatic, face symmetric, no Cushingoid features. EYES:  Glasses.  Bown eyes.  Pupils equal round and reactive to light and accomodation.  No  conjunctivitis or scleral icterus. ENT:  Oropharynx clear without lesion.  Tongue normal. Mucous membranes moist.  RESPIRATORY:  Clear to auscultation without rales, wheezes or rhonchi. CARDIOVASCULAR:  Regular rate and rhythm without murmur, rub or gallop. CHEST WALL:  Unremarkable. ABDOMEN:  Soft, non-tender, with active bowel sounds, and no hepatosplenomegaly.  No masses. SKIN:  No abdominal bruising.  No rashes, ulcers or lesions. EXTREMITIES: No edema, no skin discoloration or tenderness.  No palpable cords. LYMPH NODES: No palpable cervical, supraclavicular, axillary or inguinal adenopathy  NEUROLOGICAL: Unremarkable. PSYCH:  Appropriate.    Orders Only on 09/29/2017  Component Date Value Ref Range Status  . WBC 09/29/2017 9.9  3.8 - 10.6 K/uL Final  . RBC 09/29/2017 4.60  4.40 - 5.90 MIL/uL Final  . Hemoglobin 09/29/2017 14.1  13.0 - 18.0 g/dL Final  . HCT 09/29/2017 41.9  40.0 - 52.0 % Final  . MCV 09/29/2017 91.1  80.0 - 100.0 fL Final  . MCH 09/29/2017 30.6  26.0 - 34.0 pg Final  . MCHC 09/29/2017 33.7  32.0 - 36.0 g/dL Final  . RDW 09/29/2017 15.1* 11.5 - 14.5 %  Final  . Platelets 09/29/2017 277  150 - 440 K/uL Final  . Neutrophils Relative % 09/29/2017 65  % Final  . Neutro Abs 09/29/2017 6.4  1.4 - 6.5 K/uL Final  . Lymphocytes Relative 09/29/2017 23  % Final  . Lymphs Abs 09/29/2017 2.3  1.0 - 3.6 K/uL Final  . Monocytes Relative 09/29/2017 9  % Final  . Monocytes Absolute 09/29/2017 0.9  0.2 - 1.0 K/uL Final  . Eosinophils Relative 09/29/2017 2  % Final  . Eosinophils Absolute 09/29/2017 0.2  0 - 0.7 K/uL Final  . Basophils Relative 09/29/2017 1  % Final  . Basophils Absolute 09/29/2017 0.1  0 - 0.1 K/uL Final   Performed at [facility], [address], [place], [place] [address]  . Sodium 09/29/2017 136  135 - 145 mmol/L Final  . Potassium 09/29/2017 4.0  3.5 - 5.1 mmol/L Final  . Chloride 09/29/2017 103  101 - 111 mmol/L Final  . CO2 09/29/2017 24  22 - 32 mmol/L Final  . Glucose, Bld 09/29/2017 126* 65 - 99 mg/dL Final  . BUN 09/29/2017 13  6 - 20 mg/dL Final  . Creatinine, Ser 09/29/2017 1.34* 0.61 - 1.24 mg/dL Final  . Calcium 09/29/2017 9.0  8.9 - 10.3 mg/dL Final  . Total Protein 09/29/2017 7.1  6.5 - 8.1 g/dL Final  . Albumin 09/29/2017 3.7  3.5 - 5.0 g/dL Final  . AST 09/29/2017 37  15 - 41 U/L Final  . ALT 09/29/2017 73* 17 - 63 U/L Final  . Alkaline Phosphatase 09/29/2017 81  38 - 126 U/L Final  . Total Bilirubin 09/29/2017 0.4  0.3 - 1.2 mg/dL Final  . GFR calc non Af Amer 09/29/2017 59* >60 mL/min Final  . GFR calc Af Amer 09/29/2017 >60  >60 mL/min Final   Comment: (NOTE) The eGFR has been calculated using the CKD EPI equation. This calculation has not been validated in all clinical situations. eGFR's persistently <60 mL/min signify possible Chronic Kidney Disease.   Georgiann Hahn gap 09/29/2017 9  5 - 15 Final   Performed at [facility], [address] [place], [place] [address]    Assessment:  Justin [name] is a 52 y.o. male with recurrent thrombosis x 4.  He developed a right upper extremity clot in  2010. He underwent thrombolysis.  He was on Coumadin x 6 months.  He developed a recurrent clot in the right upper extremity in 2011.  He underwent thoracic outlet decompression.  He was on Coumadin for 6-9 months.  He developed a right lower extremity DVT in 2016.  He was started on Xarelto.  He developed a left upper extremity DVT on 06/07/2016 while on Xarelto.  Duplex revealed a near occlusive DVT in the lateral aspect of the left subclavian vein and nonocclusive thrombus extending into the adjacent axillary vein.  He was started on Lovenox.  Hypercoagulable work-up on 06/29/2016 revealed the following normal studies:  CBC with diff, Factor V Leiden, prothrombin gene mutation, protein C antigen and activity, protein S antigen and activity, ATIII antigen and activity, anticardiolipin antibodies and beta-2 glycoprotein antibodies.  Lupus anticoagulant panel was positive on Xarelto and Lovenox.  Chest CT angiogram on 07/02/2016 revealed no evidence of pulmonary embolism.  There were small pulmonary nodules, largest 3 mm, of unclear significance.  Bone scan on 08/04/2016 revealed a focus of moderately increased uptake at approximately the T9 right paravertebral region. Chest CT on 07/20/2016 revealed a large right sided osteophyte at the T9-T10 level which was felt to explain the increased right paravertebral uptake uptake.  There  was also mildly increased uptake in the body of L2. Review of the lumbar spine on the abdominal CT scan of the same date revealed no definite radiographic abnormality in the body of L2 to explain the mildly increased uptake.  There was no abnormal uptake within the ribs or elsewhere within the skeleton.  Thoracic spine MRI on 09/14/2016 revealed mild degenerative disc disease in the thoracic spine with non-compressive disc bulges at T5-6 and below.  There was no disc herniation or compressive stenosis of the canal or foramina.  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 On the right at T9-10, there was fairly bulky osteophytes emanating from the right costovertebral articulation. There was no evidence of neural compression secondary to this or the other bony degenerative changes.  There was ordinary mild facet osteoarthritis without edematous change or significant hypertrophy.  He underwent right lateral mini-thoracotomy for resection of a bony rib lesion on 01/14/2017.  Notes indicate he underwent thoracic discectomy with decompression by anterior approach.  Pathology revealed an "exophytic lesion".  He has had right sided rib pain post-operatively.  Symptomatically, he has ongoing post operative pain in his RIGHT ribs. Pain has been improved with recent intercostal nerve blocks x 2. He is scheduled for additional blocks soon.  He continues on Lovenox BID. Patient denies shortness of breath. He is no longer using supplemental oxygen at home.  Exam is unremarkable. Creatinine 1.34 (CrCl 85.4 mL/min).   Plan: 1.  Labs today:  CBC with diff, CMP. 2.  Discuss anticoagulation. Patient developed a clot on Xarelto. Likelihood of failing apixaban is high. Discussed anticoagulation using warfarin and the monitoring that is required. Patient elects to continue Lovenox BID as previously prescribed.  Discuss bone thinning with prolonged use of Lovenox. 3.  Continue to follow-up with pain management Dossie Arbour) for scheduled intercostal nerve block procedures. 4.  Continue to follow up with surgery for post operative assessments.  5.  Schedule bone density. 6.  RTC in 3 months for MD assessment, labs (CBC with diff, CMP), and review of bone density study.   Honor Loh, NP 09/29/17, 11:25 AM   I saw and evaluated the patient, participating in the  key portions of the service and reviewing pertinent diagnostic studies and records.  I reviewed the nurse practitioner's note and agree with the findings and the plan.   The assessment and plan were discussed with the patient.  A few questions were asked by the patient and answered.   Nolon Stalls, MD 09/29/2017,11:25 AM

## 2017-09-29 ENCOUNTER — Other Ambulatory Visit: Payer: Self-pay | Admitting: *Deleted

## 2017-09-29 ENCOUNTER — Other Ambulatory Visit: Payer: Self-pay

## 2017-09-29 ENCOUNTER — Inpatient Hospital Stay: Payer: 59 | Attending: Hematology and Oncology

## 2017-09-29 ENCOUNTER — Inpatient Hospital Stay (HOSPITAL_BASED_OUTPATIENT_CLINIC_OR_DEPARTMENT_OTHER): Payer: 59 | Admitting: Hematology and Oncology

## 2017-09-29 ENCOUNTER — Encounter: Payer: Self-pay | Admitting: Hematology and Oncology

## 2017-09-29 VITALS — BP 118/82 | HR 73 | Temp 97.0°F | Resp 18 | Wt 262.8 lb

## 2017-09-29 DIAGNOSIS — K219 Gastro-esophageal reflux disease without esophagitis: Secondary | ICD-10-CM | POA: Diagnosis not present

## 2017-09-29 DIAGNOSIS — R918 Other nonspecific abnormal finding of lung field: Secondary | ICD-10-CM

## 2017-09-29 DIAGNOSIS — D6852 Prothrombin gene mutation: Secondary | ICD-10-CM | POA: Diagnosis not present

## 2017-09-29 DIAGNOSIS — I82B12 Acute embolism and thrombosis of left subclavian vein: Secondary | ICD-10-CM

## 2017-09-29 DIAGNOSIS — Z86718 Personal history of other venous thrombosis and embolism: Secondary | ICD-10-CM | POA: Diagnosis not present

## 2017-09-29 DIAGNOSIS — Z7901 Long term (current) use of anticoagulants: Secondary | ICD-10-CM

## 2017-09-29 DIAGNOSIS — Z87891 Personal history of nicotine dependence: Secondary | ICD-10-CM | POA: Diagnosis not present

## 2017-09-29 DIAGNOSIS — D6851 Activated protein C resistance: Secondary | ICD-10-CM

## 2017-09-29 DIAGNOSIS — Z79899 Other long term (current) drug therapy: Secondary | ICD-10-CM | POA: Diagnosis not present

## 2017-09-29 DIAGNOSIS — R760 Raised antibody titer: Secondary | ICD-10-CM

## 2017-09-29 DIAGNOSIS — L819 Disorder of pigmentation, unspecified: Secondary | ICD-10-CM | POA: Diagnosis not present

## 2017-09-29 DIAGNOSIS — I82511 Chronic embolism and thrombosis of right femoral vein: Secondary | ICD-10-CM

## 2017-09-29 DIAGNOSIS — B078 Other viral warts: Secondary | ICD-10-CM | POA: Diagnosis not present

## 2017-09-29 LAB — COMPREHENSIVE METABOLIC PANEL
ALT: 73 U/L — ABNORMAL HIGH (ref 17–63)
AST: 37 U/L (ref 15–41)
Albumin: 3.7 g/dL (ref 3.5–5.0)
Alkaline Phosphatase: 81 U/L (ref 38–126)
Anion gap: 9 (ref 5–15)
BUN: 13 mg/dL (ref 6–20)
CO2: 24 mmol/L (ref 22–32)
Calcium: 9 mg/dL (ref 8.9–10.3)
Chloride: 103 mmol/L (ref 101–111)
Creatinine, Ser: 1.34 mg/dL — ABNORMAL HIGH (ref 0.61–1.24)
GFR calc Af Amer: >60 mL/min (ref >60)
GFR calc non Af Amer: 59 mL/min — ABNORMAL LOW (ref >60)
Glucose, Bld: 126 mg/dL — ABNORMAL HIGH (ref 65–99)
Potassium: 4 mmol/L (ref 3.5–5.1)
Sodium: 136 mmol/L (ref 135–145)
Total Bilirubin: 0.4 mg/dL (ref 0.3–1.2)
Total Protein: 7.1 g/dL (ref 6.5–8.1)

## 2017-09-29 LAB — CBC WITH DIFFERENTIAL/PLATELET
Basophils Absolute: 0.1 10*3/uL (ref 0–0.1)
Basophils Relative: 1 %
Eosinophils Absolute: 0.2 10*3/uL (ref 0–0.7)
Eosinophils Relative: 2 %
HCT: 41.9 % (ref 40.0–52.0)
Hemoglobin: 14.1 g/dL (ref 13.0–18.0)
Lymphocytes Relative: 23 %
Lymphs Abs: 2.3 10*3/uL (ref 1.0–3.6)
MCH: 30.6 pg (ref 26.0–34.0)
MCHC: 33.7 g/dL (ref 32.0–36.0)
MCV: 91.1 fL (ref 80.0–100.0)
Monocytes Absolute: 0.9 10*3/uL (ref 0.2–1.0)
Monocytes Relative: 9 %
Neutro Abs: 6.4 10*3/uL (ref 1.4–6.5)
Neutrophils Relative %: 65 %
Platelets: 277 10*3/uL (ref 150–440)
RBC: 4.6 MIL/uL (ref 4.40–5.90)
RDW: 15.1 % — ABNORMAL HIGH (ref 11.5–14.5)
WBC: 9.9 10*3/uL (ref 3.8–10.6)

## 2017-09-29 NOTE — Progress Notes (Signed)
Patient here for follow-up. Offers no complaints.  °

## 2017-10-05 ENCOUNTER — Other Ambulatory Visit: Payer: Self-pay | Admitting: Hematology and Oncology

## 2017-10-05 DIAGNOSIS — I82B12 Acute embolism and thrombosis of left subclavian vein: Secondary | ICD-10-CM

## 2017-10-05 DIAGNOSIS — M898X1 Other specified disorders of bone, shoulder: Secondary | ICD-10-CM

## 2017-10-05 DIAGNOSIS — R918 Other nonspecific abnormal finding of lung field: Secondary | ICD-10-CM

## 2017-10-05 DIAGNOSIS — R760 Raised antibody titer: Secondary | ICD-10-CM

## 2017-10-11 ENCOUNTER — Ambulatory Visit (HOSPITAL_BASED_OUTPATIENT_CLINIC_OR_DEPARTMENT_OTHER): Payer: 59 | Admitting: Pain Medicine

## 2017-10-11 ENCOUNTER — Other Ambulatory Visit: Payer: Self-pay

## 2017-10-11 ENCOUNTER — Ambulatory Visit
Admission: RE | Admit: 2017-10-11 | Discharge: 2017-10-11 | Disposition: A | Discharge status: Home / Self Care | Payer: 59 | Source: Ambulatory Visit | Attending: Pain Medicine | Admitting: Pain Medicine

## 2017-10-11 ENCOUNTER — Encounter: Payer: Self-pay | Admitting: Pain Medicine

## 2017-10-11 VITALS — BP 134/73 | HR 66 | Temp 98.4°F | Resp 18 | Ht 71.0 in | Wt 260.0 lb

## 2017-10-11 DIAGNOSIS — Z7901 Long term (current) use of anticoagulants: Secondary | ICD-10-CM

## 2017-10-11 DIAGNOSIS — G8912 Acute post-thoracotomy pain: Secondary | ICD-10-CM | POA: Diagnosis not present

## 2017-10-11 DIAGNOSIS — M5414 Radiculopathy, thoracic region: Secondary | ICD-10-CM | POA: Diagnosis not present

## 2017-10-11 DIAGNOSIS — R0781 Pleurodynia: Secondary | ICD-10-CM | POA: Insufficient documentation

## 2017-10-11 DIAGNOSIS — G588 Other specified mononeuropathies: Secondary | ICD-10-CM

## 2017-10-11 DIAGNOSIS — G8929 Other chronic pain: Secondary | ICD-10-CM

## 2017-10-11 DIAGNOSIS — R1011 Right upper quadrant pain: Secondary | ICD-10-CM

## 2017-10-11 DIAGNOSIS — M546 Pain in thoracic spine: Secondary | ICD-10-CM

## 2017-10-11 MED ORDER — LIDOCAINE HCL 2 % IJ SOLN
20.0000 mL | Freq: Once | INTRAMUSCULAR | Status: AC
  Administered 2017-10-11: 400 mg
  Filled 2017-10-11: qty 40

## 2017-10-11 MED ORDER — FENTANYL CITRATE (PF) 100 MCG/2ML IJ SOLN
25.0000 ug | INTRAMUSCULAR | Status: DC | PRN
  Administered 2017-10-11: 50 ug via INTRAVENOUS
  Filled 2017-10-11: qty 2

## 2017-10-11 MED ORDER — DEXAMETHASONE SODIUM PHOSPHATE 10 MG/ML IJ SOLN
10.0000 mg | Freq: Once | INTRAMUSCULAR | Status: AC
  Administered 2017-10-11: 10 mg
  Filled 2017-10-11: qty 1

## 2017-10-11 MED ORDER — MIDAZOLAM HCL 5 MG/5ML IJ SOLN
1.0000 mg | INTRAMUSCULAR | Status: DC | PRN
  Administered 2017-10-11: 2 mg via INTRAVENOUS
  Filled 2017-10-11: qty 5

## 2017-10-11 MED ORDER — LACTATED RINGERS IV SOLN
1000.0000 mL | Freq: Once | INTRAVENOUS | Status: AC
  Administered 2017-10-11: 1000 mL via INTRAVENOUS

## 2017-10-11 MED ORDER — ROPIVACAINE HCL 2 MG/ML IJ SOLN
9.0000 mL | Freq: Once | INTRAMUSCULAR | Status: AC
  Administered 2017-10-11: 9 mL via PERINEURAL
  Filled 2017-10-11: qty 10

## 2017-10-11 NOTE — Patient Instructions (Signed)

## 2017-10-11 NOTE — Progress Notes (Signed)
Patient's Name: Justin [name]  MRN: [id number]  Referring Provider: Milinda Pointer, MD  DOB: 12/11/1965  PCP: Valerie Roys, DO  DOS: 10/11/2017  Note by: Gaspar Cola, MD  Service setting: Ambulatory outpatient  Specialty: Interventional Pain Management  Patient type: Established  Location: [facility] (AMB) Pain Management Facility  Visit type: Interventional Procedure   Primary Reason for Visit: Interventional Pain Management Treatment. CC: Chest Pain (right rib)  Procedure:  Anesthesia, Analgesia, Anxiolysis:  Type: Diagnostic Posterior Intercostal  Nerve Block #2 Region: Posterolateral Thoracic Area Level: T6, T7, T8, & T9 Ribs Laterality: Right-Sided Position: Prone   Type: Moderate (Conscious) Sedation combined with Local Anesthesia Indication(s): Analgesia and Anxiety Route: Intravenous (IV) IV Access: Secured Sedation: Meaningful verbal contact was maintained at all times during the procedure  Local Anesthetic: Lidocaine 1-2%   Indications: 1. Intercostal neuralgia (T6, T7) (Right)   2. Chronic thoracic back pain (Primary Area of Pain) (Right)   3. 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 Rib pain on right side (Fourth Area of Pain)   5. Right upper quadrant abdominal pain (Tertiary Area of Pain)   6. Thoracic radiculitis (Secondary Area of Pain)    Pain Score: Pre-procedure: 7 /10 Post-procedure: 0-No pain/10  Pre-op Assessment:  Justin [name] is a 52 y.o. (year old), male patient, seen today for interventional treatment. He  has a past surgical history that includes Knee cartilage surgery; Carotid angiogram; Scalenotomy w/o resection cervical rib (2012); and Right upper extremity thrombectomy (2012). Justin [name] has a current medication list which includes the following prescription(s): atorvastatin, gnp calcium 1200, vitamin d3, enoxaparin, gabapentin, magnesium, and oxycodone, and the following Facility-Administered Medications: fentanyl  and midazolam. His primarily concern today is the Chest Pain (right rib)  Initial Vital Signs:  Pulse/HCG Rate: 66ECG Heart Rate: 74 Temp: 98.2 F (36.8 C) Resp: 14 BP: (!) 143/79 SpO2: 100 %  BMI: Estimated body mass index is 36.26 kg/m as calculated from the following:   Height as of this encounter: 5\' 11"  (1.803 m).   Weight as of this encounter: 260 lb (117.9 kg).  Risk Assessment: Allergies: Reviewed. He has No Known Allergies.  Allergy Precautions: None required Coagulopathies: Reviewed. None identified.  Blood-thinner therapy: None at this time Active Infection(s): Reviewed. None identified. Justin [name] is afebrile  Site Confirmation: Justin [name] was asked to confirm the procedure and laterality before marking the site Procedure checklist: Completed Consent: Before the procedure and under the influence of no sedative(s), amnesic(s), or anxiolytics, the patient was informed of the treatment options, risks and possible complications. To fulfill our ethical and legal obligations, as recommended by the American Medical Association's Code of Ethics, I have informed the patient of my clinical impression; the nature and purpose of the treatment or procedure; the risks, benefits, and possible complications of the intervention; the alternatives, including doing nothing; the risk(s) and benefit(s) of the alternative treatment(s) or procedure(s); and the risk(s) and benefit(s) of doing nothing. The patient was provided information about the general risks and possible complications associated with the procedure. These may include, but are not limited to: failure to achieve desired goals, infection, bleeding, organ or nerve damage, allergic reactions, paralysis, and death. In addition, the patient was informed of those risks and complications associated to the procedure, such as failure to decrease pain; infection; bleeding; organ or nerve damage with subsequent damage to sensory, motor, and/or  autonomic systems, resulting in permanent pain, numbness, and/or weakness of one or several areas  of the body; allergic reactions; (i.e.: anaphylactic reaction); and/or death. Furthermore, the patient was informed of those risks and complications associated with the medications. These include, but are not limited to: allergic reactions (i.e.: anaphylactic or anaphylactoid reaction(s)); adrenal axis suppression; blood sugar elevation that in diabetics may result in ketoacidosis or comma; water retention that in patients with history of congestive heart failure may result in shortness of breath, pulmonary edema, and decompensation with resultant heart failure; weight gain; swelling or edema; medication-induced neural toxicity; particulate matter embolism and blood vessel occlusion with resultant organ, and/or nervous system infarction; and/or aseptic necrosis of one or more joints. Finally, the patient was informed that Medicine is not an exact science; therefore, there is also the possibility of unforeseen or unpredictable risks and/or possible complications that may result in a catastrophic outcome. The patient indicated having understood very clearly. We have given the patient no guarantees and we have made no promises. Enough time was given to the patient to ask questions, all of which were answered to the patient's satisfaction. Justin [name] has indicated that he wanted to continue with the procedure. Attestation: I, the ordering provider, attest that I have discussed with the patient the benefits, risks, side-effects, alternatives, likelihood of achieving goals, and potential problems during recovery for the procedure that I have provided informed consent. Date  Time: 10/11/2017  9:34 AM  Pre-Procedure Preparation:  Monitoring: As per clinic protocol. Respiration, ETCO2, SpO2, BP, heart rate and rhythm monitor placed and checked for adequate function Safety Precautions: Patient was assessed for positional  comfort and pressure points before starting the procedure. Time-out: I initiated and conducted the "Time-out" before starting the procedure, as per protocol. The patient was asked to participate by confirming the accuracy of the "Time Out" information. Verification of the correct person, site, and procedure were performed and confirmed by me, the nursing staff, and the patient. "Time-out" conducted as per Joint Commission's Universal Protocol (UP.01.01.01). Time: 1101  Description of Procedure Process:   Target Area: The sub-costal neurovascular bundle area Approach: Sub-costal approach Area Prepped: Entire Mid-Axillary Thoracic Region Prepping solution: ChloraPrep (2% chlorhexidine gluconate and 70% isopropyl alcohol) Safety Precautions: Aspiration looking for blood return was conducted prior to all injections. At no point did we inject any substances, as a needle was being advanced. No attempts were made at seeking any paresthesias. Safe injection practices and needle disposal techniques used. Medications properly checked for expiration dates. SDV (single dose vial) medications used. Description of the Procedure: Protocol guidelines were followed. The patient was placed in position over the procedure table. The target area was identified and the area prepped in the usual manner. Skin & deeper tissues infiltrated with local anesthetic. After cleaning the skin with an antiseptic solution, 1-2 mL of dilute local anesthetic was infiltrated subcutaneously at the planned injection site. The fingers of the palpating hand were used to straddle the insertion site at the inferior border of the rib and fix the skin to avoid unwanted skin movement. Appropriate amount of time allowed to pass for local anesthetics to take effect. The procedure needles were then advanced to the target area at an angle of approximately 20 cephalad to the skin. Contact with the rib was made. While maintaining the same angle of  insertion, the needle was walked off the inferior border of the rib as the skin was allowed to return to its initial position. Then the needle was advanced no more than 3 mm below the inferior margin of the rib. Proper  needle placement was secured. Negative aspiration confirmed. Following negative aspiration for blood or air, 3-5 mL of local anesthetic was injected. The solution injected in intermittent fashion, asking for systemic symptoms every 0.5cc of injectate. The needle was then removed and the area cleansed, making sure to leave some of the prepping solution back to take advantage of its long term bactericidal properties. Vitals:   10/11/17 1109 10/11/17 1120 10/11/17 1130 10/11/17 1141  BP: 130/63 130/68 131/70 134/73  Pulse:      Resp: 15 17 18 18   Temp:  98.6 F (37 C)  98.4 F (36.9 C)  TempSrc:      SpO2: 98% 98% 99% 99%  Weight:      Height:        Start Time: 1101 hrs. End Time: 1109 hrs.   Materials:  Needle(s) Type: Regular needle Gauge: 22G Length: 3.5-in Medication(s): Please see orders for medications and dosing details.  Imaging Guidance (Non-Spinal):  Type of Imaging Technique: Fluoroscopy Guidance (Non-Spinal) Indication(s): Assistance in needle guidance and placement for procedures requiring needle placement in or near specific anatomical locations not easily accessible without such assistance. Exposure Time: Please see nurses notes. Contrast: Before injecting any contrast, we confirmed that the patient did not have an allergy to iodine, shellfish, or radiological contrast. Once satisfactory needle placement was completed at the desired level, radiological contrast was injected. Contrast injected under live fluoroscopy. No contrast complications. See chart for type and volume of contrast used. Fluoroscopic Guidance: I was personally present during the use of fluoroscopy. "Tunnel Vision Technique" used to obtain the best possible view of the target area. Parallax  error corrected before commencing the procedure. "Direction-depth-direction" technique used to introduce the needle under continuous pulsed fluoroscopy. Once target was reached, antero-posterior, oblique, and lateral fluoroscopic projection used confirm needle placement in all planes. Images permanently stored in EMR. Interpretation: I personally interpreted the imaging intraoperatively. Adequate needle placement confirmed in multiple planes. Appropriate spread of contrast into desired area was observed. No evidence of afferent or efferent intravascular uptake. Permanent images saved into the patient's record.  Antibiotic Prophylaxis:   Anti-infectives (From admission, onward)   None     Indication(s): None identified  Post-operative Assessment:  Post-procedure Vital Signs:  Pulse/HCG Rate: 6670 Temp: 98.4 F (36.9 C) Resp: 18 BP: 134/73 SpO2: 99 %  EBL: None  Complications: No immediate post-treatment complications observed by team, or reported by patient.  Note: The patient tolerated the entire procedure well. A repeat set of vitals were taken after the procedure and the patient was kept under observation following institutional policy, for this type of procedure. Post-procedural neurological assessment was performed, showing return to baseline, prior to discharge. The patient was provided with post-procedure discharge instructions, including a section on how to identify potential problems. Should any problems arise concerning this procedure, the patient was given instructions to immediately contact us, at any time, without hesitation. In any case, we plan to contact the patient by telephone for a follow-up status report regarding this interventional procedure.  Comments:  No additional relevant information.  Plan of Care    Imaging Orders     DG C-Arm 1-60 Min-No Report  Procedure Orders     INTERCOSTAL NERVE BLOCK  Medications ordered for procedure: Meds ordered this  encounter  Medications  . lidocaine (XYLOCAINE) 2 % (with pres) injection 400 mg  . midazolam (VERSED) 5 MG/5ML injection 1-2 mg    Make sure Flumazenil is available in the pyxis when using  this medication. If oversedation occurs, administer 0.2 mg IV over 15 sec. If after 45 sec no response, administer 0.2 mg again over 1 min; may repeat at 1 min intervals; not to exceed 4 doses (1 mg)  . fentaNYL (SUBLIMAZE) injection 25-50 mcg    Make sure Narcan is available in the pyxis when using this medication. In the event of respiratory depression (RR< 8/min): Titrate NARCAN (naloxone) in increments of 0.1 to 0.2 mg IV at 2-3 minute intervals, until desired degree of reversal.  . lactated ringers infusion 1,000 mL  . ropivacaine (PF) 2 mg/mL (0.2%) (NAROPIN) injection 9 mL  . dexamethasone (DECADRON) injection 10 mg   Medications administered: We administered lidocaine, midazolam, fentaNYL, lactated ringers, ropivacaine (PF) 2 mg/mL (0.2%), and dexamethasone.  See the medical record for exact dosing, route, and time of administration.  New Prescriptions   No medications on file   Disposition: Discharge home  Discharge Date & Time: 10/11/2017; 1143 hrs.   Physician-requested Follow-up: Return for post-procedure eval (2 wks), w/ Dr. Dossie Arbour.  Future Appointments  Date Time Provider [place]  10/27/2017  9:00 AM [facility] 1 [facility]  11/07/2017  1:45 PM Milinda Pointer, MD [facility] None  12/26/2017  1:30 PM [facility] None  12/26/2017  2:00 PM Karen Kitchens, NP [facility] None  09/14/2018  1:00 PM Volney American, PA-C [facility]   Primary Care Physician: Valerie Roys, DO Location: [facility] Out[facility] Management Facility Note by: Gaspar Cola, MD Date: 10/11/2017; Time: 12:36 PM  Disclaimer:  Medicine is not an exact science. The only guarantee in medicine is that nothing is guaranteed. It is important to note that the decision to proceed with  this intervention was based on the information collected from the patient. The Data and conclusions were drawn from the patient's questionnaire, the interview, and the physical examination. Because the information was provided in large part by the patient, it cannot be guaranteed that it has not been purposely or unconsciously manipulated. Every effort has been made to obtain as much relevant data as possible for this evaluation. It is important to note that the conclusions that lead to this procedure are derived in large part from the available data. Always take into account that the treatment will also be dependent on availability of resources and existing treatment guidelines, considered by other Pain Management Practitioners as being common knowledge and practice, at the time of the intervention. For Medico-Legal purposes, it is also important to point out that variation in procedural techniques and pharmacological choices are the acceptable norm. The indications, contraindications, technique, and results of the above procedure should only be interpreted and judged by a Board-Certified Interventional Pain Specialist with extensive familiarity and expertise in the same exact procedure and technique.

## 2017-10-12 ENCOUNTER — Telehealth: Payer: Self-pay

## 2017-10-12 NOTE — Telephone Encounter (Signed)
Talked with and  States that he is just a little sore but is doing fine. Instructed to call if needed.

## 2017-10-18 ENCOUNTER — Telehealth: Payer: Self-pay

## 2017-10-18 NOTE — Telephone Encounter (Signed)
Received Disability Determination Services forms °Placed in inter-office mail and sent to [employer] °

## 2017-10-26 ENCOUNTER — Other Ambulatory Visit: Payer: Self-pay | Admitting: Pain Medicine

## 2017-10-26 DIAGNOSIS — R700 Elevated erythrocyte sedimentation rate: Secondary | ICD-10-CM

## 2017-10-26 DIAGNOSIS — Z7901 Long term (current) use of anticoagulants: Secondary | ICD-10-CM

## 2017-10-26 DIAGNOSIS — R760 Raised antibody titer: Secondary | ICD-10-CM

## 2017-10-26 DIAGNOSIS — R7982 Elevated C-reactive protein (CRP): Secondary | ICD-10-CM

## 2017-10-26 NOTE — Progress Notes (Unsigned)
Patient's Name: Justin [name]  MRN: [id number]  Referring Provider: No ref. provider found  DOB: 1965/09/13  PCP: Valerie Roys, DO  DOS: 10/26/2017  Note by: Gaspar Cola, MD  Service: Record Note  Specialty: Interventional Pain Management  Patient type: Established    The patient's PCP (MP) has requested that the patient has a lupus panel drawn on a procedure today as he will be off of his Lovenox anticoagulation. The patient does have an elevated sedimentation rate and C-reactive protein.  Future Appointments  Date Time Provider [place]  10/27/2017  9:00 AM [facility]  11/07/2017  1:45 PM Milinda Pointer, MD [facility] None  12/26/2017  1:30 PM [facility] None  12/26/2017  2:00 PM Karen Kitchens, NP [facility] None  09/14/2018  1:00 PM Volney American, PA-C [facility]   Primary Care Physician: Valerie Roys, DO Location: [facility] Outpatient Pain Management Facility Note by: Gaspar Cola, MD Date: 10/26/2017; Time: 6:12 AM

## 2017-10-27 ENCOUNTER — Other Ambulatory Visit: Payer: Self-pay

## 2017-11-06 NOTE — Progress Notes (Signed)
Patient's Name: Justin [name]  MRN: [id number]  Referring Provider: Valerie Roys, DO  DOB: [birth year]  PCP: Valerie Roys, DO  DOS: 11/07/2017  Note by: Gaspar Cola, MD  Service setting: Ambulatory outpatient  Specialty: Interventional Pain Management  Location: [facility] (AMB) Pain Management Facility    Patient type: Established   Primary Reason(s) for Visit: Encounter for post-procedure evaluation of chronic illness with mild to moderate exacerbation CC: Abdominal Pain (thoracic right side s/p tumor removal )  HPI  Justin [name] is a 52 y.o. year old, male patient, who comes today for a post-procedure evaluation. He has Abnormal finding on EKG; Atypical chest pain; Left subclavian vein thrombosis ([place]); Pain of left scapula; Dyspnea; Lupus anticoagulant positive; DVT (deep venous thrombosis) ([place]); Thoracic outlet syndrome; TMJ tenderness, right; Left-sided low back pain with left-sided sciatica; Radiculopathy of thoracic region; Chronic thoracic back pain (Primary Area of Pain) (Right); Thoracic radiculitis (Secondary Area of Pain); Chronic pain syndrome; Long term current use of opiate analgesic; Long term current use of anticoagulant (Lovenox); Pharmacologic therapy; Disorder of skeletal system; Problems influencing health status; Rib pain on right side (Fourth Area of Pain); Right upper quadrant abdominal pain (Tertiary Area of Pain); Elevated C-reactive protein (CRP); Elevated sed rate; Vitamin D deficiency; 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 His primarily concern today is the Abdominal Pain (thoracic right side s/p tumor removal )  Pain Assessment: Location: Right Rib cage Radiating: denies Onset: More than a month ago Duration: Chronic pain Quality: (stinging, pulling sensation) Severity: 8 /10 (subjective, self-reported pain score)   Note: Reported level is inconsistent with clinical observations. Clinically the patient looks like a 2/10 A 2/10 is viewed as "Mild to Moderate" and described as noticeable and distracting. Impossible to hide from other people. More frequent flare-ups. Still possible to adapt and function close to normal. It can be very annoying and may have occasional stronger flare-ups. With discipline, patients may get used to it and adapt. Justin [name] does not seem to understand the use of our objective pain scale When using our objective Pain Scale, levels between 6 and 10/10 are said to belong in an emergency room, as it progressively worsens from a 6/10, described as severely limiting, requiring emergency care not usually available at an outpatient pain management facility. At a 6/10 level, communication becomes difficult and requires great effort. Assistance to reach the emergency department may be required. Facial flushing and profuse sweating along with potentially dangerous increases in heart rate and blood pressure will be evident. Effect on ADL: difficult to lay on the right side and can't lay flat, uses pillows for positioning Timing: Constant Modifying factors: procedures, medication BP: (!) 155/98  HR: 69  Justin [name] comes in today for post-procedure evaluation after the treatment done on 10/12/2017. The patient returns today for follow-up after his second diagnostic right-sided intercostal nerve block. The patient seems to have done rather well with this therapy but the pain continues to return. Because of this, it is my professional opinion that we need to move onto the next step, the radiofrequency ablation.  In addition, the patient has ran out of his pain medicine and we will proceed with a refill. At this point, we will continue to do 30 day refills until after we complete the radiofrequency. At that point, we will then reevaluate and adjust his dose and then we will  do 3 months refills at a time. The  medication management of the patient will be continued by Michaell Cowing, NP under my supervision.  Further details on both, my assessment(s), as well as the proposed treatment plan, please see below.  Post-Procedure Assessment  10/11/2017 Procedure: Diagnostic right-sided T6, T7, T8, & T9 intercostal nerve blocks #2, under fluoroscopic guidance and IV sedation Pre-procedure pain score:  7/10 Post-procedure pain score: 0/10 (100% relief) Influential Factors: BMI: 35.76 kg/m Intra-procedural challenges: None observed.         Assessment challenges: None detected.              Reported side-effects: None.        Post-procedural adverse reactions or complications: None reported         Sedation: Sedation provided. When no sedatives are used, the analgesic levels obtained are directly associated to the effectiveness of the local anesthetics. However, when sedation is provided, the level of analgesia obtained during the initial 1 hour following the intervention, is believed to be the result of a combination of factors. These factors may include, but are not limited to: 1. The effectiveness of the local anesthetics used. 2. The effects of the analgesic(s) and/or anxiolytic(s) used. 3. The degree of discomfort experienced by the patient at the time of the procedure. 4. The patients ability and reliability in recalling and recording the events. 5. The presence and influence of possible secondary gains and/or psychosocial factors. Reported result: Relief experienced during the 1st hour after the procedure: 100 % (Ultra-Short Term Relief)            Interpretative annotation: Clinically appropriate result. Analgesia during this period is likely to be Local Anesthetic and/or IV Sedative (Analgesic/Anxiolytic) related.          Effects of local anesthetic: The analgesic effects attained during this period are directly associated to the localized infiltration of local anesthetics and therefore cary  significant diagnostic value as to the etiological location, or anatomical origin, of the pain. Expected duration of relief is directly dependent on the pharmacodynamics of the local anesthetic used. Long-acting (4-6 hours) anesthetics used.  Reported result: Relief during the next 4 to 6 hour after the procedure: 100 % (Short-Term Relief)            Interpretative annotation: Clinically appropriate result. Analgesia during this period is likely to be Local Anesthetic-related.          Long-term benefit: Defined as the period of time past the expected duration of local anesthetics (1 hour for short-acting and 4-6 hours for long-acting). With the possible exception of prolonged sympathetic blockade from the local anesthetics, benefits during this period are typically attributed to, or associated with, other factors such as analgesic sensory neuropraxia, antiinflammatory effects, or beneficial biochemical changes provided by agents other than the local anesthetics.  Reported result: Extended relief following procedure: 50 %(top intercostal region is better, continues to have pain in the lower intercostals at the same intensity that it was before. ) (Long-Term Relief)            Interpretative annotation: Clinically appropriate result. Good relief. No permanent benefit expected. Inflammation plays a part in the etiology to the pain.          Current benefits: Defined as reported results that persistent at this point in time.   Analgesia: 50 % Mr. Laura reports improvement of axial symptoms. Function: Somewhat improved ROM: Somewhat improved Interpretative annotation: Partial relief. Therapeutic benefit observed. Results would suggest  persistent aggravating factors.          Interpretation: Results would suggest a successful diagnostic intervention.                  Plan:  Proceed with Radiofrequency Ablation for the purpose of attaining long-term benefits.       "The patient has failed to respond to  conservative therapies including over-the-counter medications, anti-inflammatories, muscle relaxants, membrane stabilizers, opioids, physical therapy modalities such as heat and ice, as well as more invasive techniques such as nerve blocks. Because Mr. Stcharles did attain more than 50% relief of the pain during a series of diagnostic blocks conducted in separate occasions, I believe it is medically necessary to proceed with Radiofrequency Ablation, in order to attempt gaining longer relief.   Medical Necessity: Mr. Nelles has been dealing with the above chronic pain from the Intercostal neuralgia (postthoracotomy syndrome) for longer than three months and has either failed to respond, was unable to tolerate, or simply did not get enough benefit from other more conservative therapies including, but not limited to: 1. Over-the-counter medications 2. Anti-inflammatory medications 3. Muscle relaxants 4. Membrane stabilizers 5. Opioids 6. Physical therapy 7. Modalities (Heat, ice, etc.) 8. Invasive techniques such as nerve blocks. Mr. Noorani has attained more than 50% relief of the pain from a series of diagnostic injections conducted in separate occasions. For this reason, I believe it is medically necessary to proceed Radiofrequency Ablation for the purpose of attempting to prolong the duration of the benefits seen with the diagnostic injections.  Laboratory Chemistry  Inflammation Markers (CRP: Acute Phase) (ESR: Chronic Phase) Lab Results  Component Value Date   CRP 11.4 (H) 07/28/2017   ESRSEDRATE 40 (H) 07/28/2017   LATICACIDVEN 0.9 07/20/2016                         Renal Markers Lab Results  Component Value Date   BUN 13 09/29/2017   CREATININE 1.34 (H) 09/29/2017   BCR 9 09/08/2017   GFRAA >60 09/29/2017   GFRNONAA 59 (L) 09/29/2017                             Hepatic Markers Lab Results  Component Value Date   AST 37 09/29/2017   ALT 73 (H) 09/29/2017   ALBUMIN 3.7  09/29/2017                        Hematology Parameters Lab Results  Component Value Date   INR 1.03 07/20/2016   LABPROT 13.5 07/20/2016   APTT 45 (H) 07/20/2016   PLT 277 09/29/2017   HGB 14.1 09/29/2017   HCT 41.9 09/29/2017                        CV Markers Lab Results  Component Value Date   BNP 18.0 07/20/2016   TROPONINI <0.03 12/27/2016                         Note: Lab results reviewed.  Recent Diagnostic Imaging Results  DG C-Arm 1-60 Min-No Report Fluoroscopy was utilized by the requesting physician.  No radiographic  interpretation.   Complexity Note: I personally reviewed the fluoroscopic imaging of the procedure.  Meds   Current Outpatient Medications:  .  acetaminophen (TYLENOL) 500 MG tablet, Take 1,000 mg by mouth every 6 (six) hours as needed., Disp: , Rfl:  .  atorvastatin (LIPITOR) 10 MG tablet, Take 1 tablet (10 mg total) by mouth daily., Disp: 90 tablet, Rfl: 1 .  Calcium Carbonate-Vit D-Min (GNP CALCIUM 1200) 1200-1000 MG-UNIT CHEW, Chew 1,200 mg by mouth daily with breakfast. Take in combination with vitamin D and magnesium., Disp: 30 tablet, Rfl: 5 .  Cholecalciferol (VITAMIN D3) 5000 units CAPS, Take 1 capsule (5,000 Units total) by mouth daily with breakfast. Take along with calcium and magnesium., Disp: 30 capsule, Rfl: 5 .  enoxaparin (LOVENOX) 150 MG/ML injection, INJECT 0.74 MLS (110 MG TOTAL) INTO THE SKIN 2 (TWO) TIMES DAILY, Disp: 60 Syringe, Rfl: 0 .  gabapentin (NEURONTIN) 300 MG capsule, Take 1 capsule (300 mg total) by mouth 3 (three) times daily. Take 1 tab TID, can take an extra tab at bedtime (Patient taking differently: Take 300 mg by mouth 3 (three) times daily. Take 1 tab TID, can take an extra tab at bedtime), Disp: 120 capsule, Rfl: 1 .  Magnesium 500 MG CAPS, Take 1 capsule (500 mg total) by mouth 2 (two) times daily at 8 am and 10 pm., Disp: 60 capsule, Rfl: 5 .  oxyCODONE (OXY IR/ROXICODONE) 5 MG immediate  release tablet, Take 1 tablet (5 mg total) by mouth every 8 (eight) hours as needed for severe pain., Disp: 90 tablet, Rfl: 0  ROS  Constitutional: Denies any fever or chills Gastrointestinal: No reported hemesis, hematochezia, vomiting, or acute GI distress Musculoskeletal: Denies any acute onset joint swelling, redness, loss of ROM, or weakness Neurological: No reported episodes of acute onset apraxia, aphasia, dysarthria, agnosia, amnesia, paralysis, loss of coordination, or loss of consciousness  Allergies  Mr. Rains has No Known Allergies.  [place]  Drug: Mr. Stanislawski  reports that he does not use drugs. Alcohol:  reports that he does not drink alcohol. Tobacco:  reports that he has quit smoking. He smoked 0.50 packs per day. He has never used smokeless tobacco. Medical:  has a past medical history of Arm vein blood clot, DVT (deep venous thrombosis) ([place]) (2012; January 2016), GERD (gastroesophageal reflux disease), Rib pain on right side (08/04/2016), and Vascular thoracic outlet syndrome. Surgical: Mr. Rideaux  has a past surgical history that includes Knee cartilage surgery; Carotid angiogram; Scalenotomy w/o resection cervical rib (2012); and Right upper extremity thrombectomy (2012). Family: family history includes Breast cancer in his father; Cancer in his maternal grandmother; Diabetes in his father; Heart attack in his father; Heart disease in his father; Leukemia in his maternal grandfather; Prostate cancer in his maternal uncle.  Constitutional Exam  General appearance: Well nourished, well developed, and well hydrated. In no apparent acute distress Vitals:   11/07/17 1350  BP: (!) 155/98  Pulse: 69  Resp: 16  Temp: 97.9 F (36.6 C)  TempSrc: Oral  SpO2: 100%  Weight: 260 lb (117.9 kg)  Height: 5' 11.5" (1.816 m)   BMI Assessment: Estimated body mass index is 35.76 kg/m as calculated from the following:   Height as of this encounter: 5' 11.5" (1.816 m).   Weight as  of this encounter: 260 lb (117.9 kg).  BMI interpretation table: BMI level Category Range association with higher incidence of chronic pain  <18 kg/m2 Underweight   18.5-24.9 kg/m2 Ideal body weight   25-29.9 kg/m2 Overweight Increased incidence by 20%  30-34.9 kg/m2 Obese (Class I)  Increased incidence by 68%  35-39.9 kg/m2 Severe obesity (Class II) Increased incidence by 136%  >40 kg/m2 Extreme obesity (Class III) Increased incidence by 254%   Patient's current BMI Ideal Body weight  Body mass index is 35.76 kg/m. Ideal body weight: 76.5 kg (168 lb 8.7 oz) Adjusted ideal body weight: 93 kg (205 lb 2 oz)   BMI Readings from Last 4 Encounters:  11/07/17 35.76 kg/m  10/11/17 36.26 kg/m  09/29/17 36.14 kg/m  09/21/17 34.38 kg/m   Wt Readings from Last 4 Encounters:  11/07/17 260 lb (117.9 kg)  10/11/17 260 lb (117.9 kg)  09/29/17 262 lb 12.8 oz (119.2 kg)  09/21/17 250 lb (113.4 kg)  Psych/Mental status: Alert, oriented x 3 (person, place, & time)       Eyes: PERLA Respiratory: No evidence of acute respiratory distress  Cervical Spine Area Exam  Skin & Axial Inspection: No masses, redness, edema, swelling, or associated skin lesions Alignment: Symmetrical Functional ROM: Unrestricted ROM      Stability: No instability detected Muscle Tone/Strength: Functionally intact. No obvious neuro-muscular anomalies detected. Sensory (Neurological): Unimpaired Palpation: No palpable anomalies              Upper Extremity (UE) Exam    Side: Right upper extremity  Side: Left upper extremity  Skin & Extremity Inspection: Skin color, temperature, and hair growth are WNL. No peripheral edema or cyanosis. No masses, redness, swelling, asymmetry, or associated skin lesions. No contractures.  Skin & Extremity Inspection: Skin color, temperature, and hair growth are WNL. No peripheral edema or cyanosis. No masses, redness, swelling, asymmetry, or associated skin lesions. No contractures.   Functional ROM: Unrestricted ROM          Functional ROM: Unrestricted ROM          Muscle Tone/Strength: Functionally intact. No obvious neuro-muscular anomalies detected.  Muscle Tone/Strength: Functionally intact. No obvious neuro-muscular anomalies detected.  Sensory (Neurological): Unimpaired          Sensory (Neurological): Unimpaired          Palpation: No palpable anomalies              Palpation: No palpable anomalies              Provocative Test(s):  Phalen's test: deferred Tinel's test: deferred Apley's scratch test (touch opposite shoulder):  Action 1 (Across chest): deferred Action 2 (Overhead): deferred Action 3 (LB reach): deferred   Provocative Test(s):  Phalen's test: deferred Tinel's test: deferred Apley's scratch test (touch opposite shoulder):  Action 1 (Across chest): deferred Action 2 (Overhead): deferred Action 3 (LB reach): deferred    Thoracic Spine Area Exam  Skin & Axial Inspection: Well healed scar from previous spine surgery detected (thoracotomy) Alignment: Symmetrical Functional ROM: Unrestricted ROM Stability: No instability detected Muscle Tone/Strength: Functionally intact. No obvious neuro-muscular anomalies detected. Sensory (Neurological): Movement associated discomfort Muscle strength & Tone: Complains of area being tender to palpation  Lumbar Spine Area Exam  Skin & Axial Inspection: No masses, redness, or swelling Alignment: Symmetrical Functional ROM: Unrestricted ROM       Stability: No instability detected Muscle Tone/Strength: Functionally intact. No obvious neuro-muscular anomalies detected. Sensory (Neurological): Unimpaired Palpation: No palpable anomalies       Provocative Tests: Lumbar Hyperextension/rotation test: deferred today       Lumbar quadrant test (Kemp's test): deferred today       Lumbar Lateral bending test: deferred today       Patrick's Maneuver: deferred today  FABER test: deferred today        Thigh-thrust test: deferred today       S-I compression test: deferred today       S-I distraction test: deferred today        Gait & Posture Assessment  Ambulation: Unassisted Gait: Relatively normal for age and body habitus Posture: WNL   Lower Extremity Exam    Side: Right lower extremity  Side: Left lower extremity  Stability: No instability observed          Stability: No instability observed          Skin & Extremity Inspection: Skin color, temperature, and hair growth are WNL. No peripheral edema or cyanosis. No masses, redness, swelling, asymmetry, or associated skin lesions. No contractures.  Skin & Extremity Inspection: Skin color, temperature, and hair growth are WNL. No peripheral edema or cyanosis. No masses, redness, swelling, asymmetry, or associated skin lesions. No contractures.  Functional ROM: Unrestricted ROM                  Functional ROM: Unrestricted ROM                  Muscle Tone/Strength: Functionally intact. No obvious neuro-muscular anomalies detected.  Muscle Tone/Strength: Functionally intact. No obvious neuro-muscular anomalies detected.  Sensory (Neurological): Unimpaired  Sensory (Neurological): Unimpaired  Palpation: No palpable anomalies  Palpation: No palpable anomalies   Assessment  Primary Diagnosis & Pertinent Problem List: The primary encounter diagnosis was Chronic thoracic back pain (Primary Area of Pain) (Right). 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  Status Diagnosis  Improved Persistent Persistent 1. Chronic thoracic back pain (Primary Area of Pain) (Right)   2. Intercostal neuralgia (T6, T7) (Right)   3. 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 Rib pain on right side (Fourth Area  of Pain)   5. Chronic pain syndrome   6. Long term current use of anticoagulant (Lovenox)     Problems updated and reviewed during this visit: No problems updated. Plan of Care  Pharmacotherapy (Medications Ordered): Meds ordered this encounter  Medications  . oxyCODONE (OXY IR/ROXICODONE) 5 MG immediate release tablet    Sig: Take 1 tablet (5 mg total) by mouth every 8 (eight) hours as needed for severe pain.    Dispense:  90 tablet    Refill:  0    Do not place this medication, or any other prescription from our practice, on "Automatic Refill". Patient may have prescription filled one day early if pharmacy is closed on scheduled refill date. Do not fill until: 11/07/17 To last until: 12/07/17   Medications administered today: [name] had no medications administered during this visit.   Procedure Orders     Radiofrequency ablation, other Lab Orders  No laboratory test(s) ordered today   Imaging Orders  No imaging studies ordered today   Referral Orders  No referral(s) requested today    Interventional management options: Planned, scheduled, and/or pending:   Therapeutic right-sided T6, T7, T8, & T9 intercostal nerve RFA #1 under fluoroscopic guidance and IV sedation     Considering:   Diagnostic right-sided T6, T7 intercostal nerve blocks#2 Possible right-sided intercostal RFA Diagnostic right-sided thoracicSNRB Diagnosticright-sided thoracicfacetmedial nerveblock Possible right sided thoracic facet medial branch RFA  Palliative PRN treatment(s):   None at this time   Provider-requested follow-up: Return for RFA (fluoro + sedation): (R)  T6, T7, T8, & T9 intercostal nerve RFA #1, (Blood-thinner Protocol).  Future Appointments  Date Time Provider [place]  12/05/2017  9:45 AM Vevelyn Francois, NP [facility] None  12/06/2017  2:20 PM [facility]  12/26/2017  1:30 PM [facility] None  12/26/2017  2:00 PM Karen Kitchens, NP [facility] None  09/14/2018  1:00 PM Volney American, PA-C [facility]   Primary Care Physician: Valerie Roys, DO Location: [facility] Note by: Gaspar Cola, MD Date: 11/07/2017; Time: 2:56 PM

## 2017-11-07 ENCOUNTER — Ambulatory Visit: Payer: 59 | Attending: Pain Medicine | Admitting: Pain Medicine

## 2017-11-07 ENCOUNTER — Encounter: Payer: Self-pay | Admitting: Pain Medicine

## 2017-11-07 VITALS — BP 155/98 | HR 69 | Temp 97.9°F | Resp 16 | Ht 71.5 in | Wt 260.0 lb

## 2017-11-07 DIAGNOSIS — M5442 Lumbago with sciatica, left side: Secondary | ICD-10-CM | POA: Insufficient documentation

## 2017-11-07 DIAGNOSIS — Z86718 Personal history of other venous thrombosis and embolism: Secondary | ICD-10-CM | POA: Insufficient documentation

## 2017-11-07 DIAGNOSIS — Z8042 Family history of malignant neoplasm of prostate: Secondary | ICD-10-CM | POA: Insufficient documentation

## 2017-11-07 DIAGNOSIS — Z833 Family history of diabetes mellitus: Secondary | ICD-10-CM | POA: Diagnosis not present

## 2017-11-07 DIAGNOSIS — M546 Pain in thoracic spine: Secondary | ICD-10-CM | POA: Diagnosis not present

## 2017-11-07 DIAGNOSIS — Z7901 Long term (current) use of anticoagulants: Secondary | ICD-10-CM | POA: Diagnosis not present

## 2017-11-07 DIAGNOSIS — R0781 Pleurodynia: Secondary | ICD-10-CM

## 2017-11-07 DIAGNOSIS — Z79899 Other long term (current) drug therapy: Secondary | ICD-10-CM | POA: Diagnosis not present

## 2017-11-07 DIAGNOSIS — G588 Other specified mononeuropathies: Secondary | ICD-10-CM | POA: Diagnosis not present

## 2017-11-07 DIAGNOSIS — M5414 Radiculopathy, thoracic region: Secondary | ICD-10-CM | POA: Insufficient documentation

## 2017-11-07 DIAGNOSIS — Z79891 Long term (current) use of opiate analgesic: Secondary | ICD-10-CM | POA: Diagnosis not present

## 2017-11-07 DIAGNOSIS — K219 Gastro-esophageal reflux disease without esophagitis: Secondary | ICD-10-CM | POA: Diagnosis not present

## 2017-11-07 DIAGNOSIS — G894 Chronic pain syndrome: Secondary | ICD-10-CM | POA: Diagnosis not present

## 2017-11-07 DIAGNOSIS — Z87891 Personal history of nicotine dependence: Secondary | ICD-10-CM | POA: Insufficient documentation

## 2017-11-07 DIAGNOSIS — R109 Unspecified abdominal pain: Secondary | ICD-10-CM | POA: Diagnosis not present

## 2017-11-07 DIAGNOSIS — G8929 Other chronic pain: Secondary | ICD-10-CM | POA: Diagnosis not present

## 2017-11-07 DIAGNOSIS — G8912 Acute post-thoracotomy pain: Secondary | ICD-10-CM

## 2017-11-07 DIAGNOSIS — Z9889 Other specified postprocedural states: Secondary | ICD-10-CM | POA: Insufficient documentation

## 2017-11-07 MED ORDER — OXYCODONE HCL 5 MG PO TABS: 5.0000 mg | ORAL_TABLET | Freq: Three times a day (TID) | ORAL | 0 refills | Status: DC | PRN

## 2017-11-07 NOTE — Progress Notes (Signed)
Nursing Pain Medication Assessment:  Safety precautions to be maintained throughout the outpatient stay will include: orient to surroundings, keep bed in low position, maintain call bell within reach at all times, provide assistance with transfer out of bed and ambulation.  Medication Inspection Compliance: Pill count conducted under aseptic conditions, in front of the patient. Neither the pills nor the bottle was removed from the patient's sight at any time. Once count was completed pills were immediately returned to the patient in their original bottle.  Medication: Oxycodone IR Pill/Patch Count: 0 of 90 pills remain Pill/Patch Appearance: Markings consistent with prescribed medication Bottle Appearance: Standard pharmacy container. Clearly labeled. Filled Date: 04 / 25 / 2019 Last Medication intake:  Ran out of medicine more than 48 hours ago

## 2017-11-07 NOTE — Patient Instructions (Addendum)
____________________________________________________________________________________________  Preparing for Procedure with Sedation  Instructions: . Oral Intake: Do not eat or drink anything for at least 8 hours prior to your procedure. . Transportation: Public transportation is not allowed. Bring an adult driver. The driver must be physically present in our waiting room before any procedure can be started. Marland Kitchen Physical Assistance: Bring an adult physically capable of assisting you, in the event you need help. This adult should keep you company at home for at least 6 hours after the procedure. . Blood Pressure Medicine: Take your blood pressure medicine with a sip of water the morning of the procedure. . Blood thinners:  . Diabetics on insulin: Notify the staff so that you can be scheduled 1st case in the morning. If your diabetes requires high dose insulin, take only  of your normal insulin dose the morning of the procedure and notify the staff that you have done so. . Preventing infections: Shower with an antibacterial soap the morning of your procedure. . Build-up your immune system: Take 1000 mg of Vitamin C with every meal (3 times a day) the day prior to your procedure. Marland Kitchen Antibiotics: Inform the staff if you have a condition or reason that requires you to take antibiotics before dental procedures. . Pregnancy: If you are pregnant, call and cancel the procedure. . Sickness: If you have a cold, fever, or any active infections, call and cancel the procedure. . Arrival: You must be in the facility at least 30 minutes prior to your scheduled procedure. . Children: Do not bring children with you. . Dress appropriately: Bring dark clothing that you would not mind if they get stained. . Valuables: Do not bring any jewelry or valuables.  Procedure appointments are reserved for interventional treatments only. Marland Kitchen No Prescription Refills. . No medication changes will be discussed during procedure  appointments. . No disability issues will be discussed.  Remember:  Regular Business hours are:  Monday to Thursday 8:00 AM to 4:00 PM  Provider's Schedule: Milinda Pointer, MD:  Procedure days: Tuesday and Thursday 7:30 AM to 4:00 PM  Gillis Santa, MD:  Procedure days: Monday and Wednesday 7:30 AM to 4:00 PM ____________________________________________________________________________________________   Radiofrequency Lesioning Radiofrequency lesioning is a procedure that is performed to relieve pain. The procedure is often used for back, neck, or arm pain. Radiofrequency lesioning involves the use of a machine that creates radio waves to make heat. During the procedure, the heat is applied to the nerve that carries the pain signal. The heat damages the nerve and interferes with the pain signal. Pain relief usually starts about 2 weeks after the procedure and lasts for 6 months to 1 year. Tell a health care provider about: Any allergies you have. All medicines you are taking, including vitamins, herbs, eye drops, creams, and over-the-counter medicines. Any problems you or family members have had with anesthetic medicines. Any blood disorders you have. Any surgeries you have had. Any medical conditions you have. Whether you are pregnant or may be pregnant. What are the risks? Generally, this is a safe procedure. However, problems may occur, including: Pain or soreness at the injection site. Infection at the injection site. Damage to nerves or blood vessels.  What happens before the procedure? Ask your health care provider about: Changing or stopping your regular medicines. This is especially important if you are taking diabetes medicines or blood thinners. Taking medicines such as aspirin and ibuprofen. These medicines can thin your blood. Do not take these medicines before your procedure  if your health care provider instructs you not to. Follow instructions from your health care  provider about eating or drinking restrictions. Plan to have someone take you home after the procedure. If you go home right after the procedure, plan to have someone with you for 24 hours. What happens during the procedure? You will be given one or more of the following: A medicine to help you relax (sedative). A medicine to numb the area (local anesthetic). You will be awake during the procedure. You will need to be able to talk with the health care provider during the procedure. With the help of a type of X-ray (fluoroscopy), the health care provider will insert a radiofrequency needle into the area to be treated. Next, a wire that carries the radio waves (electrode) will be put through the radiofrequency needle. An electrical pulse will be sent through the electrode to verify the correct nerve. You will feel a tingling sensation, and you may have muscle twitching. Then, the tissue that is around the needle tip will be heated by an electric current that is passed using the radiofrequency machine. This will numb the nerves. A bandage (dressing) will be put on the insertion area after the procedure is done. The procedure may vary among health care providers and hospitals. What happens after the procedure? Your blood pressure, heart rate, breathing rate, and blood oxygen level will be monitored often until the medicines you were given have worn off. Return to your normal activities as directed by your health care provider. This information is not intended to replace advice given to you by your health care provider. Make sure you discuss any questions you have with your health care provider. Document Released: 01/20/2011 Document Revised: 10/30/2015 Document Reviewed: 07/01/2014 Elsevier Interactive Patient Education  2018 [employer].  ____________________________________________________________________________________________  Pain Scale  Introduction: The pain score used by this practice  is the Verbal Numerical Rating Scale (VNRS-11). This is an 11-point scale. It is for adults and children 10 years or older. There are significant differences in how the pain score is reported, used, and applied. Forget everything you learned in the past and learn this scoring system.  General Information: The scale should reflect your current level of pain. Unless you are specifically asked for the level of your worst pain, or your average pain. If you are asked for one of these two, then it should be understood that it is over the past 24 hours.  Basic Activities of Daily Living (ADL): Personal hygiene, dressing, eating, transferring, and using restroom.  Instructions: Most patients tend to report their level of pain as a combination of two factors, their physical pain and their psychosocial pain. This last one is also known as "suffering" and it is reflection of how physical pain affects you socially and psychologically. From now on, report them separately. From this point on, when asked to report your pain level, report only your physical pain. Use the following table for reference.  Pain Clinic Pain Levels (0-5/10)  Pain Level Score  Description  No Pain 0   Mild pain 1 Nagging, annoying, but does not interfere with basic activities of daily living (ADL). Patients are able to eat, bathe, get dressed, toileting (being able to get on and off the toilet and perform personal hygiene functions), transfer (move in and out of bed or a chair without assistance), and maintain continence (able to control bladder and bowel functions). Blood pressure and heart rate are unaffected. A normal heart rate for  a healthy adult ranges from 60 to 100 bpm (beats per minute).   Mild to moderate pain 2 Noticeable and distracting. Impossible to hide from other people. More frequent flare-ups. Still possible to adapt and function close to normal. It can be very annoying and may have occasional stronger flare-ups. With  discipline, patients may get used to it and adapt.   Moderate pain 3 Interferes significantly with activities of daily living (ADL). It becomes difficult to feed, bathe, get dressed, get on and off the toilet or to perform personal hygiene functions. Difficult to get in and out of bed or a chair without assistance. Very distracting. With effort, it can be ignored when deeply involved in activities.   Moderately severe pain 4 Impossible to ignore for more than a few minutes. With effort, patients may still be able to manage work or participate in some social activities. Very difficult to concentrate. Signs of autonomic nervous system discharge are evident: dilated pupils (mydriasis); mild sweating (diaphoresis); sleep interference. Heart rate becomes elevated (>115 bpm). Diastolic blood pressure (lower number) rises above 100 mmHg. Patients find relief in laying down and not moving.   Severe pain 5 Intense and extremely unpleasant. Associated with frowning face and frequent crying. Pain overwhelms the senses.  Ability to do any activity or maintain social relationships becomes significantly limited. Conversation becomes difficult. Pacing back and forth is common, as getting into a comfortable position is nearly impossible. Pain wakes you up from deep sleep. Physical signs will be obvious: pupillary dilation; increased sweating; goosebumps; brisk reflexes; cold, clammy hands and feet; nausea, vomiting or dry heaves; loss of appetite; significant sleep disturbance with inability to fall asleep or to remain asleep. When persistent, significant weight loss is observed due to the complete loss of appetite and sleep deprivation.  Blood pressure and heart rate becomes significantly elevated. Caution: If elevated blood pressure triggers a pounding headache associated with blurred vision, then the patient should immediately seek attention at an urgent or emergency care unit, as these may be signs of an impending  stroke.    Emergency Department Pain Levels (6-10/10)  Emergency Room Pain 6 Severely limiting. Requires emergency care and should not be seen or managed at an outpatient pain management facility. Communication becomes difficult and requires great effort. Assistance to reach the emergency department may be required. Facial flushing and profuse sweating along with potentially dangerous increases in heart rate and blood pressure will be evident.   Distressing pain 7 Self-care is very difficult. Assistance is required to transport, or use restroom. Assistance to reach the emergency department will be required. Tasks requiring coordination, such as bathing and getting dressed become very difficult.   Disabling pain 8 Self-care is no longer possible. At this level, pain is disabling. The individual is unable to do even the most "basic" activities such as walking, eating, bathing, dressing, transferring to a bed, or toileting. Fine motor skills are lost. It is difficult to think clearly.   Incapacitating pain 9 Pain becomes incapacitating. Thought processing is no longer possible. Difficult to remember your own name. Control of movement and coordination are lost.   The worst pain imaginable 10 At this level, most patients pass out from pain. When this level is reached, collapse of the autonomic nervous system occurs, leading to a sudden drop in blood pressure and heart rate. This in turn results in a temporary and dramatic drop in blood flow to the brain, leading to a loss of consciousness. Fainting is one of  the body's self defense mechanisms. Passing out puts the brain in a calmed state and causes it to shut down for a while, in order to begin the healing process.    Summary: 1. Refer to this scale when providing [place] with your pain level. 2. Be accurate and careful when reporting your pain level. This will help with your care. 3. Over-reporting your pain level will lead to loss of credibility. 4. Even  a level of 1/10 means that there is pain and will be treated at our facility. 5. High, inaccurate reporting will be documented as "Symptom Exaggeration", leading to loss of credibility and suspicions of possible secondary gains such as obtaining more narcotics, or wanting to appear disabled, for fraudulent reasons. 6. Only pain levels of 5 or below will be seen at our facility. 7. Pain levels of 6 and above will be sent to the Emergency Department and the appointment cancelled. ____________________________________________________________________________________________

## 2017-11-10 DIAGNOSIS — B078 Other viral warts: Secondary | ICD-10-CM | POA: Diagnosis not present

## 2017-11-13 ENCOUNTER — Other Ambulatory Visit: Payer: Self-pay | Admitting: Hematology and Oncology

## 2017-11-13 DIAGNOSIS — M898X1 Other specified disorders of bone, shoulder: Secondary | ICD-10-CM

## 2017-11-13 DIAGNOSIS — R918 Other nonspecific abnormal finding of lung field: Secondary | ICD-10-CM

## 2017-11-13 DIAGNOSIS — I82B12 Acute embolism and thrombosis of left subclavian vein: Secondary | ICD-10-CM

## 2017-11-13 DIAGNOSIS — R760 Raised antibody titer: Secondary | ICD-10-CM

## 2017-12-05 ENCOUNTER — Ambulatory Visit: Payer: 59 | Attending: Nurse Practitioner | Admitting: Nurse Practitioner

## 2017-12-05 ENCOUNTER — Other Ambulatory Visit: Payer: Self-pay

## 2017-12-05 ENCOUNTER — Encounter: Payer: Self-pay | Admitting: Nurse Practitioner

## 2017-12-05 VITALS — BP 160/99 | HR 85 | Temp 98.0°F | Resp 16 | Ht 71.0 in | Wt 255.0 lb

## 2017-12-05 DIAGNOSIS — R079 Chest pain, unspecified: Secondary | ICD-10-CM | POA: Insufficient documentation

## 2017-12-05 DIAGNOSIS — K219 Gastro-esophageal reflux disease without esophagitis: Secondary | ICD-10-CM | POA: Diagnosis not present

## 2017-12-05 DIAGNOSIS — M546 Pain in thoracic spine: Secondary | ICD-10-CM | POA: Diagnosis not present

## 2017-12-05 DIAGNOSIS — G8929 Other chronic pain: Secondary | ICD-10-CM

## 2017-12-05 DIAGNOSIS — G894 Chronic pain syndrome: Secondary | ICD-10-CM

## 2017-12-05 DIAGNOSIS — Z86718 Personal history of other venous thrombosis and embolism: Secondary | ICD-10-CM | POA: Insufficient documentation

## 2017-12-05 DIAGNOSIS — Z7901 Long term (current) use of anticoagulants: Secondary | ICD-10-CM | POA: Diagnosis not present

## 2017-12-05 DIAGNOSIS — Z87891 Personal history of nicotine dependence: Secondary | ICD-10-CM | POA: Insufficient documentation

## 2017-12-05 DIAGNOSIS — Z79891 Long term (current) use of opiate analgesic: Secondary | ICD-10-CM

## 2017-12-05 DIAGNOSIS — M5414 Radiculopathy, thoracic region: Secondary | ICD-10-CM

## 2017-12-05 DIAGNOSIS — Z5181 Encounter for therapeutic drug level monitoring: Secondary | ICD-10-CM | POA: Insufficient documentation

## 2017-12-05 MED ORDER — OXYCODONE HCL 5 MG PO TABS: 5.0000 mg | ORAL_TABLET | Freq: Three times a day (TID) | ORAL | 0 refills | Status: DC | PRN

## 2017-12-05 MED ORDER — OXYCODONE HCL 5 MG PO TABS
5.0000 mg | ORAL_TABLET | Freq: Three times a day (TID) | ORAL | 0 refills | Status: DC | PRN
Start: 2018-01-06 — End: 2017-12-05

## 2017-12-05 NOTE — Progress Notes (Signed)
Patient's Name: Justin [name]  MRN: [id number]  Referring Provider: Valerie Roys, DO  DOB: 09/16/1965  PCP: Valerie Roys, DO  DOS: 12/05/2017  Note by: Vevelyn Francois NP  Service setting: Ambulatory outpatient  Specialty: Interventional Pain Management  Location: [facility] (AMB) Pain Management Facility    Patient type: Established    Primary Reason(s) for Visit: Encounter for prescription drug management. (Level of risk: moderate)  CC: Chest Pain (right)  HPI  Justin [name] is a 52 y.o. year old, male patient, who comes today for a medication management evaluation. He has Abnormal finding on EKG; Atypical chest pain; Left subclavian vein thrombosis ([place]); Pain of left scapula; Dyspnea; Lupus anticoagulant positive; DVT (deep venous thrombosis) ([place]); Thoracic outlet syndrome; TMJ tenderness, right; Left-sided low back pain with left-sided sciatica; Radiculopathy of thoracic region; Chronic thoracic back pain (Primary Area of Pain) (Right); Thoracic radiculitis (Secondary Area of Pain); Chronic pain syndrome; Long term current use of opiate analgesic; Long term current use of anticoagulant (Lovenox); Pharmacologic therapy; Disorder of skeletal system; Problems influencing health status; Rib pain on right side (Fourth Area of Pain); Right upper quadrant abdominal pain (Tertiary Area of Pain); Elevated C-reactive protein (CRP); Elevated sed rate; Vitamin D deficiency; 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 His primarily concern today is the Chest Pain (right)  Pain Assessment: Location: Right Rib cage Radiating: to back some Onset: More than a month ago Duration: Chronic pain Quality: Aching, Nagging, Squeezing, Tightness Severity: 7 /10 (subjective, self-reported pain score)  Note: Reported level is compatible with observation. Clinically the patient  looks like a 3/10 A 3/10 is viewed as "Moderate" and described as significantly interfering with activities of daily living (ADL). It becomes difficult to feed, bathe, get dressed, get on and off the toilet or to perform personal hygiene functions. Difficult to get in and out of bed or a chair without assistance. Very distracting. With effort, it can be ignored when deeply involved in activities. Justin [name] does not seem to understand the use of our objective pain scale When using our objective Pain Scale, levels between 6 and 10/10 are said to belong in an emergency room, as it progressively worsens from a 6/10, described as severely limiting, requiring emergency care not usually available at an outpatient pain management facility. At a 6/10 level, communication becomes difficult and requires great effort. Assistance to reach the emergency department may be required. Facial flushing and profuse sweating along with potentially dangerous increases in heart rate and blood pressure will be evident. Effect on ADL: difficult to lay on right side, use pillow for comfort Timing: Constant Modifying factors: medications BP: (!) 160/99  HR: 85  Justin [name] was last scheduled for an appointment on 07/28/2017 for medication management. During today's appointment we reviewed Justin [name] chronic pain status, as well as his outpatient medication regimen. He admits that he had good relief with the intercostal nerve block in the upper area but continues to have pain in the lower area. He admits that he is scheduled for a  radiofrequency ablation. He admits that he is a little concerned about the procedure.  The patient  reports that he does not use drugs. His body mass index is 35.57 kg/m.  Further details on both, my assessment(s), as well as the proposed treatment plan, please see below.  Controlled Substance Pharmacotherapy Assessment REMS (Risk Evaluation and Mitigation  Strategy)  Analgesic: Oxycodone IR 5 mg1  tablets3times daily (oxycodone 15 mg/day)  MME/day:22.18m/day   GIgnatius Specking RN  12/05/2017 10:33 AM  Sign at close encounter Nursing Pain Medication Assessment:  Safety precautions to be maintained throughout the outpatient stay will include: orient to surroundings, keep bed in low position, maintain call bell within reach at all times, provide assistance with transfer out of bed and ambulation.  Medication Inspection Compliance: Pill count conducted under aseptic conditions, in front of the patient. Neither the pills nor the bottle was removed from the patient's sight at any time. Once count was completed pills were immediately returned to the patient in their original bottle.  Medication: oxycodone 529mPill/Patch Count: 5 of 90 pills remain Pill/Patch Appearance: Markings consistent with prescribed medication Bottle Appearance: Standard pharmacy container. Clearly labeled. Filled Date: 6 / 03 / 2019 Last Medication intake:  Today   Pharmacokinetics: Liberation and absorption (onset of action): WNL Distribution (time to peak effect): WNL Metabolism and excretion (duration of action): WNL         Pharmacodynamics: Desired effects: Analgesia: Mr. [place] >50% benefit. Functional ability: Patient reports that medication allows him to accomplish basic ADLs Clinically meaningful improvement in function (CMIF): Sustained CMIF goals met Perceived effectiveness: Described as relatively effective, allowing for increase in activities of daily living (ADL) Undesirable effects: Side-effects or Adverse reactions: None reported Monitoring: [place] PMP: Online review of the past 1255-monthriod conducted. Compliant with practice rules and regulations Last UDS on record: Summary  Date Value Ref Range Status  07/28/2017 FINAL  Final    Comment:    ==================================================================== TOXASSURE COMP DRUG  ANALYSIS,UR ==================================================================== Test                             Result       Flag       Units Drug Present and Declared for Prescription Verification   Oxycodone                      660          EXPECTED   ng/mg creat   Oxymorphone                    185          EXPECTED   ng/mg creat   Noroxycodone                   609          EXPECTED   ng/mg creat   Noroxymorphone                 50           EXPECTED   ng/mg creat    Sources of oxycodone are scheduled prescription medications.    Oxymorphone, noroxycodone, and noroxymorphone are expected    metabolites of oxycodone. Oxymorphone is also available as a    scheduled prescription medication.   Gabapentin                     PRESENT      EXPECTED Drug Present not Declared for Prescription Verification   Acetaminophen                  PRESENT      UNEXPECTED   Diphenhydramine  PRESENT      UNEXPECTED Drug Absent but Declared for Prescription Verification   Topiramate                     Not Detected UNEXPECTED ==================================================================== Test                      Result    Flag   Units      Ref Range   Creatinine              222              mg/dL      >=20 ==================================================================== Declared Medications:  The flagging and interpretation on this report are based on the  following declared medications.  Unexpected results may arise from  inaccuracies in the declared medications.  **Note: The testing scope of this panel includes these medications:  Gabapentin  Oxycodone  Topiramate (Topamax)  **Note: The testing scope of this panel does not include following  reported medications:  Enoxaparin (Lovenox) ==================================================================== For clinical consultation, please call [phone]. ====================================================================    UDS interpretation: Compliant          Medication Assessment Form: Reviewed. Patient indicates being compliant with therapy Treatment compliance: Compliant Risk Assessment Profile: Aberrant behavior: See prior evaluations. None observed or detected today Comorbid factors increasing risk of overdose: See prior notes. No additional risks detected today Risk of substance use disorder (SUD): Low  ORT Scoring interpretation table:  Score <3 = Low Risk for SUD  Score between 4-7 = Moderate Risk for SUD  Score >8 = High Risk for Opioid Abuse   Risk Mitigation Strategies:  Patient Counseling: Covered Patient-Prescriber Agreement (PPA): Present and active  Notification to other healthcare providers: Done  Pharmacologic Plan: No change in therapy, at this time.             Laboratory Chemistry  Inflammation Markers (CRP: Acute Phase) (ESR: Chronic Phase) Lab Results  Component Value Date   CRP 11.4 (H) 07/28/2017   ESRSEDRATE 40 (H) 07/28/2017   LATICACIDVEN 0.9 07/20/2016                         Rheumatology Markers Lab Results  Component Value Date   ANA Negative 07/28/2017                        Renal Function Markers Lab Results  Component Value Date   BUN 13 09/29/2017   CREATININE 1.34 (H) 09/29/2017   BCR 9 09/08/2017   GFRAA >60 09/29/2017   GFRNONAA 59 (L) 09/29/2017                             Hepatic Function Markers Lab Results  Component Value Date   AST 37 09/29/2017   ALT 73 (H) 09/29/2017   ALBUMIN 3.7 09/29/2017   ALKPHOS 81 09/29/2017   LIPASE 90 03/02/2014                        Electrolytes Lab Results  Component Value Date   NA 136 09/29/2017   K 4.0 09/29/2017   CL 103 09/29/2017   CALCIUM 9.0 09/29/2017   MG 2.0 07/28/2017  Neuropathy Markers Lab Results  Component Value Date   VITAMINB12 391 07/28/2017                         Bone Pathology Markers Lab Results  Component Value Date   25OHVITD1 10 (L) 07/28/2017   25OHVITD2 3.2 07/28/2017   25OHVITD3 6.8 07/28/2017                         Coagulation Parameters Lab Results  Component Value Date   INR 1.03 07/20/2016   LABPROT 13.5 07/20/2016   APTT 45 (H) 07/20/2016   PLT 277 09/29/2017                        Cardiovascular Markers Lab Results  Component Value Date   BNP 18.0 07/20/2016   TROPONINI <0.03 12/27/2016   HGB 14.1 09/29/2017   HCT 41.9 09/29/2017                         CA Markers No results found for: CEA, CA125, LABCA2                      Note: Lab results reviewed.  Recent Diagnostic Imaging Results  DG C-Arm 1-60 Min-No Report Fluoroscopy was utilized by the requesting physician.  No radiographic  interpretation.   Complexity Note: Imaging results reviewed. Results shared with Justin [name], using Layman's terms.                         Meds   Current Outpatient Medications:  .  acetaminophen (TYLENOL) 500 MG tablet, Take 1,000 mg by mouth every 6 (six) hours as needed., Disp: , Rfl:  .  atorvastatin (LIPITOR) 10 MG tablet, Take 1 tablet (10 mg total) by mouth daily., Disp: 90 tablet, Rfl: 1 .  Calcium Carbonate-Vit D-Min (GNP CALCIUM 1200) 1200-1000 MG-UNIT CHEW, Chew 1,200 mg by mouth daily with breakfast. Take in combination with vitamin D and magnesium., Disp: 30 tablet, Rfl: 5 .  Cholecalciferol (VITAMIN D3) 5000 units CAPS, Take 1 capsule (5,000 Units total) by mouth daily with breakfast. Take along with calcium and magnesium., Disp: 30 capsule, Rfl: 5 .  enoxaparin (LOVENOX) 150 MG/ML injection, INJECT 0.74 MLS (110 MG TOTAL) INTO THE SKIN 2 (TWO) TIMES DAILY, Disp: 60 Syringe, Rfl: 0 .  gabapentin (NEURONTIN) 300 MG capsule, Take 1 capsule (300 mg total) by mouth 3 (three) times daily. Take 1 tab TID, can take an extra tab at bedtime (Patient taking differently: Take 300 mg by mouth 3 (three) times daily. Take 1 tab  TID, can take an extra tab at bedtime), Disp: 120 capsule, Rfl: 1 .  ibuprofen (ADVIL,MOTRIN) 800 MG tablet, Take 800 mg by mouth every 8 (eight) hours as needed., Disp: , Rfl:  .  Magnesium 500 MG CAPS, Take 1 capsule (500 mg total) by mouth 2 (two) times daily at 8 am and 10 pm., Disp: 60 capsule, Rfl: 5 .  [START ON 12/07/2017] oxyCODONE (OXY IR/ROXICODONE) 5 MG immediate release tablet, Take 1 tablet (5 mg total) by mouth every 8 (eight) hours as needed for severe pain., Disp: 90 tablet, Rfl: 0 .  [START ON 01/06/2018] oxyCODONE (OXY IR/ROXICODONE) 5 MG immediate release tablet, Take 1 tablet (5 mg total) by mouth every 8 (eight) hours as needed for severe pain., Disp: 90  tablet, Rfl: 0  ROS  Constitutional: Denies any fever or chills Gastrointestinal: No reported hemesis, hematochezia, vomiting, or acute GI distress Musculoskeletal: Denies any acute onset joint swelling, redness, loss of ROM, or weakness Neurological: No reported episodes of acute onset apraxia, aphasia, dysarthria, agnosia, amnesia, paralysis, loss of coordination, or loss of consciousness  Allergies  Justin [name] has No Known Allergies.  [place]  Drug: Justin [name]  reports that he does not use drugs. Alcohol:  reports that he does not drink alcohol. Tobacco:  reports that he has quit smoking. He smoked 0.50 packs per day. He has never used smokeless tobacco. Medical:  has a past medical history of Arm vein blood clot, DVT (deep venous thrombosis) ([place]) (2012; January 2016), GERD (gastroesophageal reflux disease), Rib pain on right side (08/04/2016), and Vascular thoracic outlet syndrome. Surgical: Justin [name]  has a past surgical history that includes Knee cartilage surgery; Carotid angiogram; Scalenotomy w/o resection cervical rib (2012); and Right upper extremity thrombectomy (2012). Family: family history includes Breast cancer in his father; Cancer in his maternal grandmother; Diabetes in his father; Heart attack in his  father; Heart disease in his father; Leukemia in his maternal grandfather; Prostate cancer in his maternal uncle.  Constitutional Exam  General appearance: Well nourished, well developed, and well hydrated. In no apparent acute distress Vitals:   12/05/17 1017  BP: (!) 160/99  Pulse: 85  Resp: 16  Temp: 98 F (36.7 C)  SpO2: 98%  Weight: 255 lb (115.7 kg)  Height: 5' 11"  (1.803 m)   BMI Assessment: Estimated body mass index is 35.57 kg/m as calculated from the following:   Height as of this encounter: 5' 11"  (1.803 m).   Weight as of this encounter: 255 lb (115.7 kg). Psych/Mental status: Alert, oriented x 3 (person, place, & time)       Eyes: PERLA Respiratory: No evidence of acute respiratory distress   Thoracic Spine Area Exam  Skin & Axial Inspection: No masses, redness, or swelling Alignment: Symmetrical Functional ROM: Unrestricted ROM Stability: No instability detected Muscle Tone/Strength: Functionally intact. No obvious neuro-muscular anomalies detected. Sensory (Neurological): Unimpaired Muscle strength & Tone: Tender  Gait & Posture Assessment  Ambulation: Unassisted Gait: Relatively normal for age and body habitus Posture: WNL    Assessment  Primary Diagnosis & Pertinent Problem List: The primary encounter diagnosis was Thoracic radiculitis (Secondary Area of Pain). Diagnoses of Chronic thoracic back pain (Primary Area of Pain) (Right), Chronic pain syndrome, and Long term prescription opiate use were also pertinent to this visit.  Status Diagnosis  Persistent Persistent Controlled 1. Thoracic radiculitis (Secondary Area of Pain)   2. Chronic thoracic back pain (Primary Area of Pain) (Right)   3. Chronic pain syndrome   4. Long term prescription opiate use     Problems updated and reviewed during this visit: No problems updated. Plan of Care  Pharmacotherapy (Medications Ordered): Meds ordered this encounter  Medications  . oxyCODONE (OXY  IR/ROXICODONE) 5 MG immediate release tablet    Sig: Take 1 tablet (5 mg total) by mouth every 8 (eight) hours as needed for severe pain.    Dispense:  90 tablet    Refill:  0    Do not place this medication, or any other prescription from our practice, on "Automatic Refill". Patient may have prescription filled one day early if pharmacy is closed on scheduled refill date. Do not fill until: 12/07/2017 To last until: 01/06/2018    Order Specific Question:   Supervising  Provider    Answer:   Milinda Pointer [phone]  . oxyCODONE (OXY IR/ROXICODONE) 5 MG immediate release tablet    Sig: Take 1 tablet (5 mg total) by mouth every 8 (eight) hours as needed for severe pain.    Dispense:  90 tablet    Refill:  0    Do not place this medication, or any other prescription from our practice, on "Automatic Refill". Patient may have prescription filled one day early if pharmacy is closed on scheduled refill date. Do not fill until: 01/06/2018 To last until: 02/05/2018    Order Specific Question:   Supervising Provider    Answer:   Milinda Pointer [phone]   New Prescriptions   OXYCODONE (OXY IR/ROXICODONE) 5 MG IMMEDIATE RELEASE TABLET    Take 1 tablet (5 mg total) by mouth every 8 (eight) hours as needed for severe pain.   Medications administered today: Justin [name] had no medications administered during this visit. Lab-work, procedure(s), and/or referral(s): Orders Placed This Encounter  Procedures  . ToxASSURE Select 13 (MW), Urine   Imaging and/or referral(s): None  Interventional management options: Planned, scheduled, and/or pending:   Therapeutic right-sided T6, T7, T8, &T9intercostal nerve RFA #1 under fluoroscopic guidance and IV sedation    Considering:   Diagnostic right-sided T6, T7 intercostal nerve blocks#2 Possible right-sided intercostal RFA Diagnostic right-sided thoracicSNRB Diagnosticright-sided thoracicfacetmedial nerveblock Possible right sided  thoracic facet medial branch RFA   Palliative PRN treatment(s):   None at this time     Provider-requested follow-up: Return in about 2 months (around 02/05/2018) for MedMgmt with Me Donella Stade Edison Pace).  Future Appointments  Date Time Provider [place]  12/06/2017  2:20 PM [facility]  12/26/2017  1:30 PM [facility] None  12/26/2017  2:00 PM Karen Kitchens, NP [facility] None  12/29/2017  2:15 PM Milinda Pointer, MD [facility] None  02/02/2018 10:30 AM Vevelyn Francois, NP [facility] None  09/14/2018  1:00 PM Volney American, PA-C [facility] [place] [facility]   Primary Care Physician: Valerie Roys, DO Location: [facility] Out[facility] Management [facility] Note by: Vevelyn Francois NP Date: 12/05/2017; Time: 3:29 PM  Pain Score Disclaimer: We use the NRS-11 scale. This is a self-reported, subjective measurement of pain severity with only modest accuracy. It is used primarily to identify changes within a particular patient. It must be understood that outpatient pain scales are significantly less accurate that those used for research, where they can be applied under ideal controlled circumstances with minimal exposure to variables. In reality, the score is likely to be a combination of pain intensity and pain affect, where pain affect describes the degree of emotional arousal or changes in action readiness caused by the sensory experience of pain. Factors such as social and work situation, setting, emotional state, anxiety levels, expectation, and prior pain experience may influence pain perception and show large inter-individual differences that may also be affected by time variables.  Patient instructions provided during this appointment: Patient Instructions   ____________________________________________________________________________________________  Medication Rules  Applies to: All patients receiving prescriptions (written or electronic).  Pharmacy of record:  Pharmacy where electronic prescriptions will be sent. If written prescriptions are taken to a different pharmacy, please inform the nursing staff. The pharmacy listed in the electronic medical record should be the one where you would like electronic prescriptions to be sent.  Prescription refills: Only during scheduled appointments. Applies to both, written and electronic prescriptions.  NOTE: The following applies primarily to controlled  substances (Opioid* Pain Medications).   Patient's responsibilities: 1. Pain Pills: Bring all pain pills to every appointment (except for procedure appointments). 2. Pill Bottles: Bring pills in original pharmacy bottle. Always bring newest bottle. Bring bottle, even if empty. 3. Medication refills: You are responsible for knowing and keeping track of what medications you need refilled. The day before your appointment, write a list of all prescriptions that need to be refilled. Bring that list to your appointment and give it to the admitting nurse. Prescriptions will be written only during appointments. If you forget a medication, it will not be "Called in", "Faxed", or "electronically sent". You will need to get another appointment to get these prescribed. 4. Prescription Accuracy: You are responsible for carefully inspecting your prescriptions before leaving our office. Have the discharge nurse carefully go over each prescription with you, before taking them home. Make sure that your name is accurately spelled, that your address is correct. Check the name and dose of your medication to make sure it is accurate. Check the number of pills, and the written instructions to make sure they are clear and accurate. Make sure that you are given enough medication to last until your next medication refill appointment. 5. Taking Medication: Take medication as prescribed. Never take more pills than instructed. Never take medication more frequently than prescribed. Taking less pills  or less frequently is permitted and encouraged, when it comes to controlled substances (written prescriptions).  6. Inform other Doctors: Always inform, all of your healthcare providers, of all the medications you take. 7. Pain Medication from other Providers: You are not allowed to accept any additional pain medication from any other Doctor or Healthcare provider. There are two exceptions to this rule. (see below) In the event that you require additional pain medication, you are responsible for notifying us, as stated below. 8. Medication Agreement: You are responsible for carefully reading and following our Medication Agreement. This must be signed before receiving any prescriptions from our practice. Safely store a copy of your signed Agreement. Violations to the Agreement will result in no further prescriptions. (Additional copies of our Medication Agreement are available upon request.) 9. Laws, Rules, & Regulations: All patients are expected to follow all Federal and [employer], [employer], Rules, [employer]. Ignorance of the Laws does not constitute a valid excuse. The use of any illegal substances is prohibited. 10. Adopted CDC guidelines & recommendations: Target dosing levels will be at or below 60 MME/day. Use of benzodiazepines** is not recommended.  Exceptions: There are only two exceptions to the rule of not receiving pain medications from other Healthcare Providers. 1. Exception #1 (Emergencies): In the event of an emergency (i.e.: accident requiring emergency care), you are allowed to receive additional pain medication. However, you are responsible for: As soon as you are able, call our office [phone], at any time of the day or night, and leave a message stating your name, the date and nature of the emergency, and the name and dose of the medication prescribed. In the event that your call is answered by a member of our staff, make sure to document and save the date, time, and the name  of the person that took your information.  2. Exception #2 (Planned Surgery): In the event that you are scheduled by another doctor or dentist to have any type of surgery or procedure, you are allowed (for a period no longer than 30 days), to receive additional pain medication, for the acute post-op pain. However,  in this case, you are responsible for picking up a copy of our "Post-op Pain Management for Surgeons" handout, and giving it to your surgeon or dentist. This document is available at our office, and does not require an appointment to obtain it. Simply go to our office during business hours (Monday-Thursday from 8:00 AM to 4:00 PM) (Friday 8:00 AM to 12:00 Noon) or if you have a scheduled appointment with [place], prior to your surgery, and ask for it by name. In addition, you will need to provide [place] with your name, name of your surgeon, type of surgery, and date of procedure or surgery.  *Opioid medications include: morphine, codeine, oxycodone, oxymorphone, hydrocodone, hydromorphone, meperidine, tramadol, tapentadol, buprenorphine, fentanyl, methadone. **Benzodiazepine medications include: diazepam (Valium), alprazolam (Xanax), clonazepam (Klonopine), lorazepam (Ativan), clorazepate (Tranxene), chlordiazepoxide (Librium), estazolam (Prosom), oxazepam (Serax), temazepam (Restoril), triazolam (Halcion) (Last updated: 08/04/2017) ____________________________________________________________________________________________   BMI Assessment: Estimated body mass index is 35.57 kg/m as calculated from the following:   Height as of this encounter: 5' 11"  (1.803 m).   Weight as of this encounter: 255 lb (115.7 kg).  BMI interpretation table: BMI level Category Range association with higher incidence of chronic pain  <18 kg/m2 Underweight   18.5-24.9 kg/m2 Ideal body weight   25-29.9 kg/m2 Overweight Increased incidence by 20%  30-34.9 kg/m2 Obese (Class I) Increased incidence by 68%  35-39.9 kg/m2  Severe obesity (Class II) Increased incidence by 136%  >40 kg/m2 Extreme obesity (Class III) Increased incidence by 254%   Patient's current BMI Ideal Body weight  Body mass index is 35.57 kg/m. Ideal body weight: 75.3 kg (166 lb 0.1 oz) Adjusted ideal body weight: 91.4 kg (201 lb 9.7 oz)   BMI Readings from Last 4 Encounters:  12/05/17 35.57 kg/m  11/07/17 35.76 kg/m  10/11/17 36.26 kg/m  09/29/17 36.14 kg/m   Wt Readings from Last 4 Encounters:  12/05/17 255 lb (115.7 kg)  11/07/17 260 lb (117.9 kg)  10/11/17 260 lb (117.9 kg)  09/29/17 262 lb 12.8 oz (119.2 kg)

## 2017-12-05 NOTE — Patient Instructions (Addendum)
____________________________________________________________________________________________  Medication Rules  Applies to: All patients receiving prescriptions (written or electronic).  Pharmacy of record: Pharmacy where electronic prescriptions will be sent. If written prescriptions are taken to a different pharmacy, please inform the nursing staff. The pharmacy listed in the electronic medical record should be the one where you would like electronic prescriptions to be sent.  Prescription refills: Only during scheduled appointments. Applies to both, written and electronic prescriptions.  NOTE: The following applies primarily to controlled substances (Opioid* Pain Medications).   Patient's responsibilities: 1. Pain Pills: Bring all pain pills to every appointment (except for procedure appointments). 2. Pill Bottles: Bring pills in original pharmacy bottle. Always bring newest bottle. Bring bottle, even if empty. 3. Medication refills: You are responsible for knowing and keeping track of what medications you need refilled. The day before your appointment, write a list of all prescriptions that need to be refilled. Bring that list to your appointment and give it to the admitting nurse. Prescriptions will be written only during appointments. If you forget a medication, it will not be "Called in", "Faxed", or "electronically sent". You will need to get another appointment to get these prescribed. 4. Prescription Accuracy: You are responsible for carefully inspecting your prescriptions before leaving our office. Have the discharge nurse carefully go over each prescription with you, before taking them home. Make sure that your name is accurately spelled, that your address is correct. Check the name and dose of your medication to make sure it is accurate. Check the number of pills, and the written instructions to make sure they are clear and accurate. Make sure that you are given enough medication to last  until your next medication refill appointment. 5. Taking Medication: Take medication as prescribed. Never take more pills than instructed. Never take medication more frequently than prescribed. Taking less pills or less frequently is permitted and encouraged, when it comes to controlled substances (written prescriptions).  6. Inform other Doctors: Always inform, all of your healthcare providers, of all the medications you take. 7. Pain Medication from other Providers: You are not allowed to accept any additional pain medication from any other Doctor or Healthcare provider. There are two exceptions to this rule. (see below) In the event that you require additional pain medication, you are responsible for notifying us, as stated below. 8. Medication Agreement: You are responsible for carefully reading and following our Medication Agreement. This must be signed before receiving any prescriptions from our practice. Safely store a copy of your signed Agreement. Violations to the Agreement will result in no further prescriptions. (Additional copies of our Medication Agreement are available upon request.) 9. Laws, Rules, & Regulations: All patients are expected to follow all Federal and State Laws, Statutes, Rules, & Regulations. Ignorance of the Laws does not constitute a valid excuse. The use of any illegal substances is prohibited. 10. Adopted CDC guidelines & recommendations: Target dosing levels will be at or below 60 MME/day. Use of benzodiazepines** is not recommended.  Exceptions: There are only two exceptions to the rule of not receiving pain medications from other Healthcare Providers. 1. Exception #1 (Emergencies): In the event of an emergency (i.e.: accident requiring emergency care), you are allowed to receive additional pain medication. However, you are responsible for: As soon as you are able, call our office [phone], at any time of the day or night, and leave a message stating your name, the  date and nature of the emergency, and the name and dose of the medication   prescribed. In the event that your call is answered by a member of our staff, make sure to document and save the date, time, and the name of the person that took your information.  2. Exception #2 (Planned Surgery): In the event that you are scheduled by another doctor or dentist to have any type of surgery or procedure, you are allowed (for a period no longer than 30 days), to receive additional pain medication, for the acute post-op pain. However, in this case, you are responsible for picking up a copy of our "Post-op Pain Management for Surgeons" handout, and giving it to your surgeon or dentist. This document is available at our office, and does not require an appointment to obtain it. Simply go to our office during business hours (Monday-Thursday from 8:00 AM to 4:00 PM) (Friday 8:00 AM to 12:00 Noon) or if you have a scheduled appointment with [place], prior to your surgery, and ask for it by name. In addition, you will need to provide [place] with your name, name of your surgeon, type of surgery, and date of procedure or surgery.  *Opioid medications include: morphine, codeine, oxycodone, oxymorphone, hydrocodone, hydromorphone, meperidine, tramadol, tapentadol, buprenorphine, fentanyl, methadone. **Benzodiazepine medications include: diazepam (Valium), alprazolam (Xanax), clonazepam (Klonopine), lorazepam (Ativan), clorazepate (Tranxene), chlordiazepoxide (Librium), estazolam (Prosom), oxazepam (Serax), temazepam (Restoril), triazolam (Halcion) (Last updated: 08/04/2017) ____________________________________________________________________________________________   BMI Assessment: Estimated body mass index is 35.57 kg/m as calculated from the following:   Height as of this encounter: 5\' 11"  (1.803 m).   Weight as of this encounter: 255 lb (115.7 kg).  BMI interpretation table: BMI level Category Range association with higher  incidence of chronic pain  <18 kg/m2 Underweight   18.5-24.9 kg/m2 Ideal body weight   25-29.9 kg/m2 Overweight Increased incidence by 20%  30-34.9 kg/m2 Obese (Class I) Increased incidence by 68%  35-39.9 kg/m2 Severe obesity (Class II) Increased incidence by 136%  >40 kg/m2 Extreme obesity (Class III) Increased incidence by 254%   Patient's current BMI Ideal Body weight  Body mass index is 35.57 kg/m. Ideal body weight: 75.3 kg (166 lb 0.1 oz) Adjusted ideal body weight: 91.4 kg (201 lb 9.7 oz)   BMI Readings from Last 4 Encounters:  12/05/17 35.57 kg/m  11/07/17 35.76 kg/m  10/11/17 36.26 kg/m  09/29/17 36.14 kg/m   Wt Readings from Last 4 Encounters:  12/05/17 255 lb (115.7 kg)  11/07/17 260 lb (117.9 kg)  10/11/17 260 lb (117.9 kg)  09/29/17 262 lb 12.8 oz (119.2 kg)

## 2017-12-05 NOTE — Progress Notes (Signed)
Nursing Pain Medication Assessment:  Safety precautions to be maintained throughout the outpatient stay will include: orient to surroundings, keep bed in low position, maintain call bell within reach at all times, provide assistance with transfer out of bed and ambulation.  Medication Inspection Compliance: Pill count conducted under aseptic conditions, in front of the patient. Neither the pills nor the bottle was removed from the patient's sight at any time. Once count was completed pills were immediately returned to the patient in their original bottle.  Medication: oxycodone 5mg  Pill/Patch Count: 5 of 90 pills remain Pill/Patch Appearance: Markings consistent with prescribed medication Bottle Appearance: Standard pharmacy container. Clearly labeled. Filled Date: 6 / 03 / 2019 Last Medication intake:  Today

## 2017-12-06 ENCOUNTER — Ambulatory Visit
Admission: RE | Admit: 2017-12-06 | Discharge: 2017-12-06 | Disposition: A | Discharge status: Home / Self Care | Payer: 59 | Source: Ambulatory Visit | Attending: Urgent Care | Admitting: Urgent Care

## 2017-12-06 DIAGNOSIS — Z7901 Long term (current) use of anticoagulants: Secondary | ICD-10-CM

## 2017-12-06 DIAGNOSIS — Z13820 Encounter for screening for osteoporosis: Secondary | ICD-10-CM | POA: Diagnosis not present

## 2017-12-09 ENCOUNTER — Other Ambulatory Visit: Payer: Self-pay | Admitting: Hematology and Oncology

## 2017-12-09 DIAGNOSIS — R918 Other nonspecific abnormal finding of lung field: Secondary | ICD-10-CM

## 2017-12-09 DIAGNOSIS — R760 Raised antibody titer: Secondary | ICD-10-CM

## 2017-12-09 DIAGNOSIS — I82B12 Acute embolism and thrombosis of left subclavian vein: Secondary | ICD-10-CM

## 2017-12-09 DIAGNOSIS — M898X1 Other specified disorders of bone, shoulder: Secondary | ICD-10-CM

## 2017-12-26 ENCOUNTER — Telehealth: Payer: Self-pay | Admitting: *Deleted

## 2017-12-26 ENCOUNTER — Inpatient Hospital Stay (HOSPITAL_BASED_OUTPATIENT_CLINIC_OR_DEPARTMENT_OTHER): Payer: 59 | Admitting: Urgent Care

## 2017-12-26 ENCOUNTER — Inpatient Hospital Stay: Payer: 59 | Attending: Hematology and Oncology

## 2017-12-26 VITALS — BP 110/81 | HR 64 | Temp 98.6°F | Resp 18 | Wt 265.4 lb

## 2017-12-26 DIAGNOSIS — G8928 Other chronic postprocedural pain: Secondary | ICD-10-CM

## 2017-12-26 DIAGNOSIS — D6851 Activated protein C resistance: Secondary | ICD-10-CM | POA: Diagnosis not present

## 2017-12-26 DIAGNOSIS — Z87891 Personal history of nicotine dependence: Secondary | ICD-10-CM | POA: Diagnosis not present

## 2017-12-26 DIAGNOSIS — D6862 Lupus anticoagulant syndrome: Secondary | ICD-10-CM

## 2017-12-26 DIAGNOSIS — Z86718 Personal history of other venous thrombosis and embolism: Secondary | ICD-10-CM | POA: Insufficient documentation

## 2017-12-26 DIAGNOSIS — Z7901 Long term (current) use of anticoagulants: Secondary | ICD-10-CM | POA: Diagnosis not present

## 2017-12-26 DIAGNOSIS — I82B12 Acute embolism and thrombosis of left subclavian vein: Secondary | ICD-10-CM

## 2017-12-26 DIAGNOSIS — R918 Other nonspecific abnormal finding of lung field: Secondary | ICD-10-CM | POA: Insufficient documentation

## 2017-12-26 DIAGNOSIS — Z79899 Other long term (current) drug therapy: Secondary | ICD-10-CM

## 2017-12-26 DIAGNOSIS — D6852 Prothrombin gene mutation: Secondary | ICD-10-CM | POA: Insufficient documentation

## 2017-12-26 DIAGNOSIS — R760 Raised antibody titer: Secondary | ICD-10-CM

## 2017-12-26 LAB — COMPREHENSIVE METABOLIC PANEL
ALT: 40 U/L (ref 0–44)
AST: 32 U/L (ref 15–41)
Albumin: 3.8 g/dL (ref 3.5–5.0)
Alkaline Phosphatase: 75 U/L (ref 38–126)
Anion gap: 8 (ref 5–15)
BUN: 11 mg/dL (ref 6–20)
CO2: 23 mmol/L (ref 22–32)
Calcium: 9.1 mg/dL (ref 8.9–10.3)
Chloride: 105 mmol/L (ref 98–111)
Creatinine, Ser: 1.29 mg/dL — ABNORMAL HIGH (ref 0.61–1.24)
GFR calc Af Amer: >60 mL/min (ref >60)
GFR calc non Af Amer: >60 mL/min (ref >60)
Glucose, Bld: 140 mg/dL — ABNORMAL HIGH (ref 70–99)
Potassium: 3.9 mmol/L (ref 3.5–5.1)
Sodium: 136 mmol/L (ref 135–145)
Total Bilirubin: 0.5 mg/dL (ref 0.3–1.2)
Total Protein: 7.1 g/dL (ref 6.5–8.1)

## 2017-12-26 LAB — CBC WITH DIFFERENTIAL/PLATELET
Basophils Absolute: 0.1 10*3/uL (ref 0–0.1)
Basophils Relative: 1 %
Eosinophils Absolute: 0.2 10*3/uL (ref 0–0.7)
Eosinophils Relative: 2 %
HCT: 41 % (ref 40.0–52.0)
Hemoglobin: 13.6 g/dL (ref 13.0–18.0)
Lymphocytes Relative: 23 %
Lymphs Abs: 1.8 10*3/uL (ref 1.0–3.6)
MCH: 31 pg (ref 26.0–34.0)
MCHC: 33.2 g/dL (ref 32.0–36.0)
MCV: 93.1 fL (ref 80.0–100.0)
Monocytes Absolute: 0.8 10*3/uL (ref 0.2–1.0)
Monocytes Relative: 10 %
Neutro Abs: 5.1 10*3/uL (ref 1.4–6.5)
Neutrophils Relative %: 64 %
Platelets: 273 10*3/uL (ref 150–440)
RBC: 4.4 MIL/uL (ref 4.40–5.90)
RDW: 13.7 % (ref 11.5–14.5)
WBC: 8 10*3/uL (ref 3.8–10.6)

## 2017-12-26 NOTE — Progress Notes (Signed)
Patient offers no complaints today.  Patient has questions to go over with NP.  States he is having surgery on Thursday this week regarding his lower ribs.

## 2017-12-26 NOTE — Progress Notes (Signed)
[facility] day:  12/26/2017   Chief Complaint: Justin [name] is a 52 y.o. male with recurrent thrombosis who is seen for 3 month assessment on Lovenox.    HPI:  The patient was last seen in the hematology clinic on 09/29/2017.  At that time, patient had been doing "better" following his recent intercostal nerve block.  Ribs were slowly healing.  Surgeon had advised patient that it could take up to "2 years before his pain was relieved".  Patient reported that he had lost his job due to prolonged absence.  No shortness of breath; no longer using supplemental oxygen at home. Exam was unremarkable. Creatinine 1.34 (CrCl 85.4 mL/min).  Patient continued on prescribed enoxaparin injections twice daily.  Knowing the patient had additional procedure scheduled with pain management, we asked that they obtain a lupus anticoagulant panel while patient is off of his  anticoagulation therapy.  Patient has undergone a total of 4 intercostal block procedures as targeted treatment for patient's intercostal neuralgia.  He remains under the care of Dr. Milinda Pointer for pain management services. Patient is scheduled for radiofrequency nerve ablation on 12/29/2017.  Patient had routine bone density study on 12/06/2017 that revealed a normal T score of -0.6 in the right femoral neck.  In the interim, patient has been doing well.  He denies any acute complaints.  Following his last intercostal nerve block, patient notes that the pain in his upper ribs has improved significantly, however the pain is lower ribs has worsened.  Patient denies any bleeding.  He continues on his prescribed enoxaparin injections daily.  Patient experiences injection site bruising. He denies any extremity swelling or pain.  Patient was to have follow-up lupus anticoagulant testing done while off of his coagulation therapy for his scheduled procedure for Dr. Dossie Arbour, however it does not appear his  blood samples were collected.  Patient denies that he has experienced any B symptoms. He denies any interval infections. Patient advises that he maintains an adequate appetite. He is eating well. Weight today is 265 lb 7 oz (120.4 kg), which compared to his last visit to the clinic, represents a 3 pound increase.   Patient denies pain in the clinic today.  Past Medical History:  Diagnosis Date  . Arm vein blood clot    right  . DVT (deep venous thrombosis) ([facility]) 2012; January 2016   History of right arm; now right lower shotty  . GERD (gastroesophageal reflux disease)   . Rib pain on right side 08/04/2016   RT rib pain that radiates around to the back. Level 8 of 10.  Constant. Started 1 mo ago, 1 week after DVT.  Marland Kitchen Vascular thoracic outlet syndrome    Right     Past Surgical History:  Procedure Laterality Date  . CAROTID ANGIOGRAM    . KNEE CARTILAGE SURGERY     Performed for cracked patella and torn meniscus  . Right upper extremity thrombectomy  2012  . SCALENOTOMY W/O RESECTION CERVICAL RIB  2012   4 right-sided thoracic outlet syndrome    Family History  Problem Relation Age of Onset  . Heart disease Father   . Breast cancer Father 38  . Diabetes Father   . Heart attack Father   . Cancer Maternal Grandmother   . Leukemia Maternal Grandfather   . Prostate cancer Maternal Uncle     Social History:  reports that he has quit smoking. He smoked 0.50 packs per  day. He has never used smokeless tobacco. He reports that he does not drink alcohol or use drugs.  He previously smoked 1/2 pack a day for 10 years.  He stopped smoking in his 24s.  He lives in [place].  He is a Glass blower/designer.  He denies any exposure to radiation or toxins.  He has a son.  The patient is accompanied by his wife, [name], today.  Allergies: No Known Allergies  Current Medications: Current Outpatient Medications  Medication Sig Dispense Refill  . acetaminophen (TYLENOL) 500 MG tablet Take 1,000 mg by  mouth every 6 (six) hours as needed.    Marland Kitchen atorvastatin (LIPITOR) 10 MG tablet Take 1 tablet (10 mg total) by mouth daily. 90 tablet 1  . Calcium Carbonate-Vit D-Min (GNP CALCIUM 1200) 1200-1000 MG-UNIT CHEW Chew 1,200 mg by mouth daily with breakfast. Take in combination with vitamin D and magnesium. 30 tablet 5  . Cholecalciferol (VITAMIN D3) 5000 units CAPS Take 1 capsule (5,000 Units total) by mouth daily with breakfast. Take along with calcium and magnesium. 30 capsule 5  . enoxaparin (LOVENOX) 150 MG/ML injection INJECT 0.74 MLS (110 MG TOTAL) INTO THE SKIN 2 (TWO) TIMES DAILY 60 Syringe 0  . gabapentin (NEURONTIN) 300 MG capsule Take 1 capsule (300 mg total) by mouth 3 (three) times daily. Take 1 tab TID, can take an extra tab at bedtime (Patient taking differently: Take 300 mg by mouth 3 (three) times daily. Take 1 tab TID, can take an extra tab at bedtime) 120 capsule 1  . ibuprofen (ADVIL,MOTRIN) 800 MG tablet Take 800 mg by mouth every 8 (eight) hours as needed.    . Magnesium 500 MG CAPS Take 1 capsule (500 mg total) by mouth 2 (two) times daily at 8 am and 10 pm. 60 capsule 5  . oxyCODONE (OXY IR/ROXICODONE) 5 MG immediate release tablet Take 1 tablet (5 mg total) by mouth every 8 (eight) hours as needed for severe pain. 90 tablet 0  . [START ON 01/06/2018] oxyCODONE (OXY IR/ROXICODONE) 5 MG immediate release tablet Take 1 tablet (5 mg total) by mouth every 8 (eight) hours as needed for severe pain. 90 tablet 0   No current facility-administered medications for this visit.     Review of Systems  Constitutional: Negative for diaphoresis, fever, malaise/fatigue and weight loss (weight up 3 pounds).       "Im feeling good".   HENT: Negative.   Eyes: Negative.   Respiratory: Negative for cough, hemoptysis, sputum production and shortness of breath.   Cardiovascular: Negative for chest pain, palpitations, orthopnea, leg swelling and PND.  Gastrointestinal: Negative for abdominal pain, blood  in stool, constipation, diarrhea, melena, nausea and vomiting.  Genitourinary: Negative for dysuria, frequency, hematuria and urgency.  Musculoskeletal: Positive for joint pain (Post-operative rib pain s/p several intercostal nerve blocks). Negative for back pain, falls and myalgias.  Skin: Negative for itching and rash.  Neurological: Negative for dizziness, tremors, weakness and headaches.  Endo/Heme/Allergies: Bruises/bleeds easily (on exoxaparin injections).  Psychiatric/Behavioral: Negative for depression, memory loss and suicidal ideas. The patient is not nervous/anxious and does not have insomnia.   All other systems reviewed and are negative.  Performance status (ECOG): 1 - Symptomatic but completely ambulatory  Vital Signs BP 110/81 (BP Location: Left Arm, Patient Position: Sitting)   Pulse 64   Temp 98.6 F (37 C) (Tympanic)   Resp 18   Wt 265 lb 7 oz (120.4 kg)   BMI 37.02 kg/m   Physical  Exam  Constitutional: He is oriented to person, place, and time and well-developed, well-nourished, and in no distress.  HENT:  Head: Normocephalic and atraumatic.  Eyes: Pupils are equal, round, and reactive to light. EOM are normal. No scleral icterus.  Glasses. Brown eyes.   Neck: Normal range of motion. Neck supple. No tracheal deviation present. No thyromegaly present.  Cardiovascular: Normal rate, regular rhythm and normal heart sounds. Exam reveals no gallop and no friction rub.  No murmur heard. Pulmonary/Chest: Effort normal and breath sounds normal. No respiratory distress. He has no wheezes. He has no rales.  Abdominal: Soft. Bowel sounds are normal. He exhibits no distension. There is no tenderness.  Musculoskeletal: Normal range of motion. He exhibits no edema or tenderness.       Back:  Lymphadenopathy:    He has no cervical adenopathy.    He has no axillary adenopathy.       Right: No inguinal and no supraclavicular adenopathy present.       Left: No inguinal and no  supraclavicular adenopathy present.  Neurological: He is alert and oriented to person, place, and time.  Skin: Skin is warm and dry. No rash noted. No erythema.  Psychiatric: Mood, affect and judgment normal.  Nursing note and vitals reviewed.   Appointment on 12/26/2017  Component Date Value Ref Range Status  . Sodium 12/26/2017 136  135 - 145 mmol/L Final  . Potassium 12/26/2017 3.9  3.5 - 5.1 mmol/L Final  . Chloride 12/26/2017 105  98 - 111 mmol/L Final  . CO2 12/26/2017 23  22 - 32 mmol/L Final  . Glucose, Bld 12/26/2017 140* 70 - 99 mg/dL Final  . BUN 12/26/2017 11  6 - 20 mg/dL Final  . Creatinine, Ser 12/26/2017 1.29* 0.61 - 1.24 mg/dL Final  . Calcium 12/26/2017 9.1  8.9 - 10.3 mg/dL Final  . Total Protein 12/26/2017 7.1  6.5 - 8.1 g/dL Final  . Albumin 12/26/2017 3.8  3.5 - 5.0 g/dL Final  . AST 12/26/2017 32  15 - 41 U/L Final  . ALT 12/26/2017 40  0 - 44 U/L Final  . Alkaline Phosphatase 12/26/2017 75  38 - 126 U/L Final  . Total Bilirubin 12/26/2017 0.5  0.3 - 1.2 mg/dL Final  . GFR calc non Af Amer 12/26/2017 >60  >60 mL/min Final  . GFR calc Af Amer 12/26/2017 >60  >60 mL/min Final   Comment: (NOTE) The eGFR has been calculated using the CKD EPI equation. This calculation has not been validated in all clinical situations. eGFR's persistently <60 mL/min signify possible Chronic Kidney Disease.   Georgiann Hahn gap 12/26/2017 8  5 - 15 Final   Performed at [facility], [address] [place], [place] [address]  . WBC 12/26/2017 8.0  3.8 - 10.6 K/uL Final  . RBC 12/26/2017 4.40  4.40 - 5.90 MIL/uL Final  . Hemoglobin 12/26/2017 13.6  13.0 - 18.0 g/dL Final  . HCT 12/26/2017 41.0  40.0 - 52.0 % Final  . MCV 12/26/2017 93.1  80.0 - 100.0 fL Final  . MCH 12/26/2017 31.0  26.0 - 34.0 pg Final  . MCHC 12/26/2017 33.2  32.0 - 36.0 g/dL Final  . RDW 12/26/2017 13.7  11.5 - 14.5 % Final  . Platelets 12/26/2017 273  150 - 440 K/uL Final  . Neutrophils Relative %  12/26/2017 64  % Final  . Neutro Abs 12/26/2017 5.1  1.4 - 6.5 K/uL Final  . Lymphocytes Relative 12/26/2017 23  %  Final  . Lymphs Abs 12/26/2017 1.8  1.0 - 3.6 K/uL Final  . Monocytes Relative 12/26/2017 10  % Final  . Monocytes Absolute 12/26/2017 0.8  0.2 - 1.0 K/uL Final  . Eosinophils Relative 12/26/2017 2  % Final  . Eosinophils Absolute 12/26/2017 0.2  0 - 0.7 K/uL Final  . Basophils Relative 12/26/2017 1  % Final  . Basophils Absolute 12/26/2017 0.1  0 - 0.1 K/uL Final   Performed at [facility], [address] [place], [place] [address]    Assessment:  Justin [name] is a 52 y.o. male with recurrent thrombosis x 4.  He developed a right upper extremity clot in 2010. He underwent thrombolysis.  He was on Coumadin x 6 months.  He developed a recurrent clot in the right upper extremity in 2011.  He underwent thoracic outlet decompression.  He was on Coumadin for 6-9 months.  He developed a right lower extremity DVT in 2016.  He was started on Xarelto.  He developed a left upper extremity DVT on 06/07/2016 while on Xarelto.  Duplex revealed a near occlusive DVT in the lateral aspect of the left subclavian vein and nonocclusive thrombus extending into the adjacent axillary vein.  He was started on Lovenox.  Hypercoagulable work-up on 06/29/2016 revealed the following normal studies:  CBC with diff, Factor V Leiden, prothrombin gene mutation, protein C antigen and activity, protein S antigen and activity, ATIII antigen and activity, anticardiolipin antibodies and beta-2 glycoprotein antibodies.  Lupus anticoagulant panel was positive on Xarelto and Lovenox.  Chest CT angiogram on 07/02/2016 revealed no evidence of pulmonary embolism.  There were small pulmonary nodules, largest 3 mm, of unclear significance.  Bone scan on 08/04/2016 revealed a focus of moderately increased uptake at approximately the T9 right paravertebral region. Chest CT on 07/20/2016 revealed a large right  sided osteophyte at the T9-T10 level which was felt to explain the increased right paravertebral uptake uptake.  There  was also mildly increased uptake in the body of L2. Review of the lumbar spine on the abdominal CT scan of the same date revealed no definite radiographic abnormality in the body of L2 to explain the mildly increased uptake.  There was no abnormal uptake within the ribs or elsewhere within the skeleton.  Thoracic spine MRI on 09/14/2016 revealed mild degenerative disc disease in the thoracic spine with non-compressive disc bulges at T5-6 and below.  There was no disc herniation or compressive stenosis of the canal or foramina.  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 On the right at T9-10, there was fairly bulky osteophytes emanating from the right costovertebral articulation. There was no evidence of neural compression secondary to this or the other bony degenerative changes.  There was ordinary mild facet osteoarthritis without edematous change or significant hypertrophy.  He underwent right lateral mini-thoracotomy for resection of a bony rib lesion on 01/14/2017.  Notes indicate he underwent thoracic discectomy with decompression by anterior approach.  Pathology revealed an "exophytic lesion".  He has had right sided rib pain post-operatively.  Bone density on 12/06/2017 revealed a normal T score of -0.6 in the right femoral neck.  Symptomatically, patient is doing well. He is schedule for radiofrequency nerve ablation procedure later this week. He has undergone a total of 4 costal nerve blocks as treatment for his postoperative intercostal neuralgia.  He continues on enoxaparin injections twice daily.  Patient denies shortness of breath,  and is no longer using supplemental oxygen at home.  Exam is grossly unremarkable.  CBC is normal.  BUN 11 and creatinine 1.29 (CrCl 88.4 mL/min).  Plan: 1. Labs today:  CBC  with diff, CMP. 2. Reviewed bone density testing.  T score was normal at  -0.6. 3. Discuss anticoagulation.  Patient wanting to discuss bridging back to oral anticoagulation.  Of note, patient developed a clot on Xarelto. Likelihood of failing apixaban is high. Discussed anticoagulation using warfarin and the monitoring that is required. Patient elects to continue Lovenox BID as previously prescribed at this time.   4. Discussed repeat lupus anticoagulant testing.  Patient was to have repeat blood sample drawn while off of anticoagulation in the past.  Originally spoke with Dr. Adalberto Cole office back in May 2019 to make them aware.  To date, repeat lupus anticoagulant testing has not been performed.  There is an active order in [facility].  I discussed the repeat testing with Dr. Earlie Server, who advised that it would be acceptable to obtain testing 12 hours following patient's last dose of enoxaparin.  Patient will be off of his injections starting on Wednesday.  He will miss a total of 2 doses prior to his procedure on Thursday morning.  Written prescription provided to patient today to provide to clinical staff on the morning of his procedure asking for the lupus anticoagulant panel to be obtained. 5. Continue to follow-up with pain management Dossie Arbour) for ongoing pain management services.   6. RTC in 3 months for MD assessment, labs (CBC with diff, CMP)  Honor Loh, NP 12/26/17, 4:57 PM

## 2017-12-29 ENCOUNTER — Other Ambulatory Visit: Payer: Self-pay

## 2017-12-29 ENCOUNTER — Ambulatory Visit (HOSPITAL_BASED_OUTPATIENT_CLINIC_OR_DEPARTMENT_OTHER): Payer: 59 | Admitting: Pain Medicine

## 2017-12-29 ENCOUNTER — Ambulatory Visit
Admission: RE | Admit: 2017-12-29 | Discharge: 2017-12-29 | Disposition: A | Discharge status: Home / Self Care | Payer: 59 | Source: Ambulatory Visit | Attending: Pain Medicine | Admitting: Pain Medicine

## 2017-12-29 ENCOUNTER — Encounter: Payer: Self-pay | Admitting: Pain Medicine

## 2017-12-29 VITALS — BP 101/60 | HR 70 | Temp 97.4°F | Resp 19 | Ht 71.0 in | Wt 260.0 lb

## 2017-12-29 DIAGNOSIS — M546 Pain in thoracic spine: Secondary | ICD-10-CM | POA: Diagnosis not present

## 2017-12-29 DIAGNOSIS — Z7901 Long term (current) use of anticoagulants: Secondary | ICD-10-CM

## 2017-12-29 DIAGNOSIS — G588 Other specified mononeuropathies: Secondary | ICD-10-CM | POA: Insufficient documentation

## 2017-12-29 DIAGNOSIS — R0781 Pleurodynia: Secondary | ICD-10-CM | POA: Insufficient documentation

## 2017-12-29 DIAGNOSIS — G8929 Other chronic pain: Secondary | ICD-10-CM

## 2017-12-29 DIAGNOSIS — G8918 Other acute postprocedural pain: Secondary | ICD-10-CM | POA: Insufficient documentation

## 2017-12-29 DIAGNOSIS — G8912 Acute post-thoracotomy pain: Secondary | ICD-10-CM | POA: Diagnosis not present

## 2017-12-29 MED ORDER — DEXAMETHASONE SODIUM PHOSPHATE 10 MG/ML IJ SOLN
10.0000 mg | Freq: Once | INTRAMUSCULAR | Status: AC
  Administered 2017-12-29: 10 mg
  Filled 2017-12-29: qty 1

## 2017-12-29 MED ORDER — HYDROCODONE-ACETAMINOPHEN 5-325 MG PO TABS: 1.0000 | ORAL_TABLET | Freq: Three times a day (TID) | ORAL | 0 refills | Status: AC | PRN

## 2017-12-29 MED ORDER — LACTATED RINGERS IV SOLN
1000.0000 mL | Freq: Once | INTRAVENOUS | Status: AC
  Administered 2017-12-29: 1000 mL via INTRAVENOUS

## 2017-12-29 MED ORDER — ROPIVACAINE HCL 2 MG/ML IJ SOLN
9.0000 mL | Freq: Once | INTRAMUSCULAR | Status: AC
  Administered 2017-12-29: 9 mL via PERINEURAL
  Filled 2017-12-29: qty 10

## 2017-12-29 MED ORDER — LIDOCAINE HCL 2 % IJ SOLN
20.0000 mL | Freq: Once | INTRAMUSCULAR | Status: AC
  Administered 2017-12-29: 400 mg
  Filled 2017-12-29: qty 20

## 2017-12-29 MED ORDER — FENTANYL CITRATE (PF) 100 MCG/2ML IJ SOLN
25.0000 ug | INTRAMUSCULAR | Status: AC | PRN
  Administered 2017-12-29: 100 ug via INTRAVENOUS
  Filled 2017-12-29: qty 2

## 2017-12-29 MED ORDER — MIDAZOLAM HCL 5 MG/5ML IJ SOLN
1.0000 mg | INTRAMUSCULAR | Status: AC | PRN
  Administered 2017-12-29: 2 mg via INTRAVENOUS
  Filled 2017-12-29: qty 5

## 2017-12-29 NOTE — Progress Notes (Signed)
Patient's Name: Justin [name]  MRN: [id number]  Referring Provider: Valerie Roys, DO  DOB: 1966/03/03  PCP: Valerie Roys, DO  DOS: 12/29/2017  Note by: Gaspar Cola, MD  Service setting: Ambulatory outpatient  Specialty: Interventional Pain Management  Patient type: Established  Location: [facility] (AMB) Pain Management Facility  Visit type: Interventional Procedure   Primary Reason for Visit: Interventional Pain Management Treatment. CC: Back Pain (right, upper)  Procedure:          Anesthesia, Analgesia, Anxiolysis:  Type: Therapeutic    Intercostal  Radiofrequency Ablation Region: Posterolateral Thoracic Area Level: T6, T7, T8, & T9 Ribs Laterality: Right-Sided Position: Left Lateral Decubitus in a 45 angle leaning forward   Type: Moderate (Conscious) Sedation combined with Local Anesthesia Indication(s): Analgesia and Anxiety Route: Intravenous (IV) IV Access: Secured Sedation: Meaningful verbal contact was maintained at all times during the procedure  Local Anesthetic: Lidocaine 1-2%   Indications: 1. 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 Intercostal neuralgia (T6, T7) (Right)   3. Rib pain on right side (Fourth Area of Pain)   4. Chronic thoracic back pain (Primary Area of Pain) (Right)   5. Long term current use of anticoagulant (Lovenox)   6. Acute postoperative pain    Mr. Rueth has been dealing with the above chronic pain for longer than three months and has either failed to respond, was unable to tolerate, or simply did not get enough benefit from other more conservative therapies including, but not limited to: 1. Over-the-counter medications 2. Anti-inflammatory medications 3. Muscle relaxants 4. Membrane stabilizers 5. Opioids 6. Physical therapy 7. Modalities (Heat, ice, etc.) 8. Invasive techniques such as nerve blocks. Mr. Lona has attained more than 50% relief of the pain from a series of diagnostic injections  conducted in separate occasions.  Pain Score: Pre-procedure: 7 /10 Post-procedure: 0-No pain/10  Pre-op Assessment:  Mr. Uher is a 52 y.o. (year old), male patient, seen today for interventional treatment. He  has a past surgical history that includes Knee cartilage surgery; Carotid angiogram; Scalenotomy w/o resection cervical rib (2012); and Right upper extremity thrombectomy (2012). Mr. Sautter has a current medication list which includes the following prescription(s): acetaminophen, atorvastatin, gnp calcium 1200, vitamin d3, enoxaparin, gabapentin, hydrocodone-acetaminophen, hydrocodone-acetaminophen, ibuprofen, magnesium, oxycodone, and oxycodone. His primarily concern today is the Back Pain (right, upper)  Initial Vital Signs:  Pulse/HCG Rate: 74ECG Heart Rate: 87 Temp: 98.3 F (36.8 C) Resp: 16 BP: 139/76 SpO2: 100 %  BMI: Estimated body mass index is 36.26 kg/m as calculated from the following:   Height as of this encounter: 5\' 11"  (1.803 m).   Weight as of this encounter: 260 lb (117.9 kg).  Risk Assessment: Allergies: Reviewed. He has No Known Allergies.  Allergy Precautions: None required Coagulopathies: Reviewed. None identified.  Blood-thinner therapy: None at this time Active Infection(s): Reviewed. None identified. Mr. Fedor is afebrile  Site Confirmation: Mr. Lofaro was asked to confirm the procedure and laterality before marking the site Procedure checklist: Completed Consent: Before the procedure and under the influence of no sedative(s), amnesic(s), or anxiolytics, the patient was informed of the treatment options, risks and possible complications. To fulfill our ethical and legal obligations, as recommended by the American Medical Association's Code of Ethics, I have informed the patient of my clinical impression; the nature and purpose of the treatment or procedure; the risks, benefits, and possible complications of the intervention; the alternatives,  including doing nothing; the risk(s) and benefit(s)  of the alternative treatment(s) or procedure(s); and the risk(s) and benefit(s) of doing nothing. The patient was provided information about the general risks and possible complications associated with the procedure. These may include, but are not limited to: failure to achieve desired goals, infection, bleeding, organ or nerve damage, allergic reactions, paralysis, and death. In addition, the patient was informed of those risks and complications associated to the procedure, such as failure to decrease pain; infection; bleeding; organ or nerve damage with subsequent damage to sensory, motor, and/or autonomic systems, resulting in permanent pain, numbness, and/or weakness of one or several areas of the body; allergic reactions; (i.e.: anaphylactic reaction); and/or death. Furthermore, the patient was informed of those risks and complications associated with the medications. These include, but are not limited to: allergic reactions (i.e.: anaphylactic or anaphylactoid reaction(s)); adrenal axis suppression; blood sugar elevation that in diabetics may result in ketoacidosis or comma; water retention that in patients with history of congestive heart failure may result in shortness of breath, pulmonary edema, and decompensation with resultant heart failure; weight gain; swelling or edema; medication-induced neural toxicity; particulate matter embolism and blood vessel occlusion with resultant organ, and/or nervous system infarction; and/or aseptic necrosis of one or more joints. Finally, the patient was informed that Medicine is not an exact science; therefore, there is also the possibility of unforeseen or unpredictable risks and/or possible complications that may result in a catastrophic outcome. The patient indicated having understood very clearly. We have given the patient no guarantees and we have made no promises. Enough time was given to the patient to ask  questions, all of which were answered to the patient's satisfaction. Mr. Agrusa has indicated that he wanted to continue with the procedure. Attestation: I, the ordering provider, attest that I have discussed with the patient the benefits, risks, side-effects, alternatives, likelihood of achieving goals, and potential problems during recovery for the procedure that I have provided informed consent. Date  Time: 12/29/2017  2:18 PM  Pre-Procedure Preparation:  Monitoring: As per clinic protocol. Respiration, ETCO2, SpO2, BP, heart rate and rhythm monitor placed and checked for adequate function Safety Precautions: Patient was assessed for positional comfort and pressure points before starting the procedure. Time-out: I initiated and conducted the "Time-out" before starting the procedure, as per protocol. The patient was asked to participate by confirming the accuracy of the "Time Out" information. Verification of the correct person, site, and procedure were performed and confirmed by me, the nursing staff, and the patient. "Time-out" conducted as per Joint Commission's Universal Protocol (UP.01.01.01). Time: 1530  Description of Procedure:          Target Area: The sub-costal neurovascular bundle area Approach: Sub-costal approach Area Prepped: Entire Mid-Axillary Thoracic Region Prepping solution: Hibiclens (4.0% Chlorhexidine gluconate solution) Safety Precautions: Aspiration looking for blood return was conducted prior to all injections. At no point did we inject any substances, as a needle was being advanced. No attempts were made at seeking any paresthesias. Safe injection practices and needle disposal techniques used. Medications properly checked for expiration dates. SDV (single dose vial) medications used. Description of the Procedure: Protocol guidelines were followed. The patient was placed in position over the procedure table. The target area was identified and the area prepped in the usual  manner. The skin and muscle were infiltrated with local anesthetic. Appropriate amount of time allowed to pass for local anesthetics to take effect. Radiofrequency needles were introduced to the target area using fluoroscopic guidance. Using the NeuroTherm NT1100 Radiofrequency Generator, sensory  stimulation using 50 Hz was used to locate & identify the nerve, making sure that the needle was positioned such that there was no sensory stimulation below 0.3 V or above 0.7 V. Stimulation using 2 Hz was used to evaluate the motor component. Care was taken not to lesion any nerves that demonstrated motor stimulation of the lower extremities at an output of less than 2.5 times that of the sensory threshold, or a maximum of 2.0 V. Once satisfactory placement of the needles was achieved, the numbing solution was slowly injected after negative aspiration. After waiting for at least 2 minutes, the ablation was performed at 80 degrees C for 60 seconds, using regular Radiofrequency settings. Once the procedure was completed, the needles were then removed and the area cleansed, making sure to leave some of the prepping solution back to take advantage of its long term bactericidal properties. Intra-operative Compliance: Compliant Vitals:   12/29/17 1614 12/29/17 1624 12/29/17 1634 12/29/17 1639  BP: 112/68 (!) 105/58 92/79 101/60  Pulse: 68 64 72 70  Resp: 19 18 16 19   Temp: 97.6 F (36.4 C)   (!) 97.4 F (36.3 C)  TempSrc: Temporal   Temporal  SpO2: 99% 98% 100% 100%  Weight:      Height:        Start Time: 1530 hrs. End Time: 1608 hrs.  Materials & Medications:  Needle(s) Type: Teflon-coated, curved tip, Radiofrequency needle(s) Gauge: 22G Length: 10cm Medication(s): Please see orders for medications and dosing details.  Imaging Guidance (Non-Spinal):          Type of Imaging Technique: Fluoroscopy Guidance (Non-Spinal) Indication(s): Assistance in needle guidance and placement for procedures requiring  needle placement in or near specific anatomical locations not easily accessible without such assistance. Exposure Time: Please see nurses notes. Contrast: Before injecting any contrast, we confirmed that the patient did not have an allergy to iodine, shellfish, or radiological contrast. Once satisfactory needle placement was completed at the desired level, radiological contrast was injected. Contrast injected under live fluoroscopy. No contrast complications. See chart for type and volume of contrast used. Fluoroscopic Guidance: I was personally present during the use of fluoroscopy. "Tunnel Vision Technique" used to obtain the best possible view of the target area. Parallax error corrected before commencing the procedure. "Direction-depth-direction" technique used to introduce the needle under continuous pulsed fluoroscopy. Once target was reached, antero-posterior, oblique, and lateral fluoroscopic projection used confirm needle placement in all planes. Images permanently stored in EMR.    Interpretation: I personally interpreted the imaging intraoperatively. Adequate needle placement confirmed in multiple planes. Appropriate spread of contrast into desired area was observed. No evidence of afferent or efferent intravascular uptake. Permanent images saved into the patient's record.  Antibiotic Prophylaxis:   Anti-infectives (From admission, onward)   None     Indication(s): None identified  Post-operative Assessment:  Post-procedure Vital Signs:  Pulse/HCG Rate: 7072 Temp: (!) 97.4 F (36.3 C) Resp: 19 BP: 101/60 SpO2: 100 %  EBL: None  Complications: No immediate post-treatment complications observed by team, or reported by patient.  Note: The patient tolerated the entire procedure well. A repeat set of vitals were taken after the procedure and the patient was kept under observation following institutional policy, for this type of procedure. Post-procedural neurological assessment  was performed, showing return to baseline, prior to discharge. The patient was provided with post-procedure discharge instructions, including a section on how to identify potential problems. Should any problems arise concerning this procedure, the patient was given  instructions to immediately contact us, at any time, without hesitation. In any case, we plan to contact the patient by telephone for a follow-up status report regarding this interventional procedure.  Comments:  No additional relevant information.  Plan of Care   Imaging Orders     DG C-Arm 1-60 Min-No Report  Procedure Orders     Radiofrequency ablation, other  Medications ordered for procedure: Meds ordered this encounter  Medications  . lidocaine (XYLOCAINE) 2 % (with pres) injection 400 mg  . midazolam (VERSED) 5 MG/5ML injection 1-2 mg    Make sure Flumazenil is available in the pyxis when using this medication. If oversedation occurs, administer 0.2 mg IV over 15 sec. If after 45 sec no response, administer 0.2 mg again over 1 min; may repeat at 1 min intervals; not to exceed 4 doses (1 mg)  . fentaNYL (SUBLIMAZE) injection 25-50 mcg    Make sure Narcan is available in the pyxis when using this medication. In the event of respiratory depression (RR< 8/min): Titrate NARCAN (naloxone) in increments of 0.1 to 0.2 mg IV at 2-3 minute intervals, until desired degree of reversal.  . lactated ringers infusion 1,000 mL  . dexamethasone (DECADRON) injection 10 mg  . ropivacaine (PF) 2 mg/mL (0.2%) (NAROPIN) injection 9 mL  . HYDROcodone-acetaminophen (NORCO/VICODIN) 5-325 MG tablet    Sig: Take 1 tablet by mouth every 8 (eight) hours as needed for up to 7 days for severe pain.    Dispense:  21 tablet    Refill:  0    For acute post-operative pain. Not to be refilled. To last 7 days.  Marland Kitchen HYDROcodone-acetaminophen (NORCO/VICODIN) 5-325 MG tablet    Sig: Take 1 tablet by mouth every 8 (eight) hours as needed for up to 7 days for  moderate pain.    Dispense:  21 tablet    Refill:  0    For acute post-operative pain. Not to be refilled. To last 7 days.   Medications administered: We administered lidocaine, midazolam, fentaNYL, lactated ringers, dexamethasone, and ropivacaine (PF) 2 mg/mL (0.2%).  See the medical record for exact dosing, route, and time of administration.  New Prescriptions   HYDROCODONE-ACETAMINOPHEN (NORCO/VICODIN) 5-325 MG TABLET    Take 1 tablet by mouth every 8 (eight) hours as needed for up to 7 days for severe pain.   HYDROCODONE-ACETAMINOPHEN (NORCO/VICODIN) 5-325 MG TABLET    Take 1 tablet by mouth every 8 (eight) hours as needed for up to 7 days for moderate pain.   Disposition: Discharge home  Discharge Date & Time: 12/29/2017; 1643 hrs.   Physician-requested Follow-up: Return for Post-RFA eval (6 wks), w/ Dionisio David, NP.  Future Appointments  Date Time Provider [place]  02/02/2018 10:30 AM Vevelyn Francois, NP [facility] None  03/30/2018  2:00 PM [facility] None  03/30/2018  2:30 PM Lequita Asal, MD [facility] None  09/14/2018  1:00 PM Volney American, PA-C [facility]   Primary Care Physician: Valerie Roys, DO Location: [facility] Note by: Gaspar Cola, MD Date: 12/29/2017; Time: 4:26 PM  Disclaimer:  Medicine is not an Chief Strategy Officer. The only guarantee in medicine is that nothing is guaranteed. It is important to note that the decision to proceed with this intervention was based on the information collected from the patient. The Data and conclusions were drawn from the patient's questionnaire, the interview, and the physical examination. Because the information was provided in large part by the  patient, it cannot be guaranteed that it has not been purposely or unconsciously manipulated. Every effort has been made to obtain as much relevant data as possible for this evaluation. It is important to note that the  conclusions that lead to this procedure are derived in large part from the available data. Always take into account that the treatment will also be dependent on availability of resources and existing treatment guidelines, considered by other Pain Management Practitioners as being common knowledge and practice, at the time of the intervention. For Medico-Legal purposes, it is also important to point out that variation in procedural techniques and pharmacological choices are the acceptable norm. The indications, contraindications, technique, and results of the above procedure should only be interpreted and judged by a Board-Certified Interventional Pain Specialist with extensive familiarity and expertise in the same exact procedure and technique.

## 2017-12-29 NOTE — Progress Notes (Signed)
Safety precautions to be maintained throughout the outpatient stay will include: orient to surroundings, keep bed in low position, maintain call bell within reach at all times, provide assistance with transfer out of bed and ambulation.  

## 2017-12-29 NOTE — Patient Instructions (Addendum)
2 prescriptions for Hydrocodone to last 7 days each given.   __________________________________________________________________________________________  Post-Radiofrequency (RF) Discharge Instructions  You have just completed a Radiofrequency Neurotomy.  The following instructions will provide you with information and guidelines for self-care upon discharge.  If at any time you have questions or concerns please call your physician. DO NOT DRIVE YOURSELF!!  Instructions:  Apply ice: Fill a plastic sandwich bag with crushed ice. Cover it with a small towel and apply to injection site. Apply for 15 minutes then remove x 15 minutes. Repeat sequence on day of procedure, until you go to bed. The purpose is to minimize swelling and discomfort after procedure.  Apply heat: Apply heat to procedure site starting the day following the procedure. The purpose is to treat any soreness and discomfort from the procedure.  Food intake: No eating limitations, unless stipulated above.  Nevertheless, if you have had sedation, you may experience some nausea.  In this case, it may be wise to wait at least two hours prior to resuming regular diet.  Physical activities: Keep activities to a minimum for the first 8 hours after the procedure. For the first 24 hours after the procedure, do not drive a motor vehicle,  Operate heavy machinery, power tools, or handle any weapons.  Consider walking with the use of an assistive device or accompanied by an adult for the first 24 hours.  Do not drink alcoholic beverages including beer.  Do not make any important decisions or sign any legal documents. Go home and rest today.  Resume activities tomorrow, as tolerated.  Use caution in moving about as you may experience mild leg weakness.  Use caution in cooking, use of household electrical appliances and climbing steps.  Driving: If you have received any sedation, you are not allowed to drive for 24 hours after your  procedure.  Blood thinner: Restart your blood thinner 6 hours after your procedure. (Only for those taking blood thinners)  Insulin: As soon as you can eat, you may resume your normal dosing schedule. (Only for those taking insulin)  Medications: May resume pre-procedure medications.  Do not take any drugs, other than what has been prescribed to you.  Infection prevention: Keep procedure site clean and dry.  Post-procedure Pain Diary: Extremely important that this be done correctly and accurately. Recorded information will be used to determine the next step in treatment.  Pain evaluated is that of treated area only. Do not include pain from an untreated area.  Complete every hour, on the hour, for the initial 8 hours. Set an alarm to help you do this part accurately.  Do not go to sleep and have it completed later. It will not be accurate.  Follow-up appointment: Keep your follow-up appointment after the procedure. Usually 2 weeks for most procedures. (6 weeks in the case of radiofrequency.) Bring you pain diary.   Expect:  From numbing medicine (AKA: Local Anesthetics): Numbness or decrease in pain.  Onset: Full effect within 15 minutes of injected.  Duration: It will depend on the type of local anesthetic used. On the average, 1 to 8 hours.   From steroids: Decrease in swelling or inflammation. Once inflammation is improved, relief of the pain will follow.  Onset of benefits: Depends on the amount of swelling present. The more swelling, the longer it will take for the benefits to be seen. In some cases, up to 10 days.  Duration: Steroids will stay in the system x 2 weeks. Duration of  benefits will depend on multiple posibilities including persistent irritating factors.  From procedure: Some discomfort is to be expected once the numbing medicine wears off. This should be minimal if ice and heat are applied as instructed.  Call if:  You experience numbness and weakness that gets  worse with time, as opposed to wearing off.  He experience any unusual bleeding, difficulty breathing, or loss of the ability to control your bowel and bladder. (This applies to Spinal procedures only)  You experience any redness, swelling, heat, red streaks, elevated temperature, fever, or any other signs of a possible infection.  Emergency Numbers:  [facility] business hours (Monday - Thursday, 8:00 AM - 4:00 PM) (Friday, 9:00 AM - 12:00 Noon): [phone]  After hours: [phone] ____________________________________________________________________________________________

## 2017-12-30 NOTE — Telephone Encounter (Signed)
No answer. Left message to call if needed after procedure

## 2018-01-25 ENCOUNTER — Ambulatory Visit: Payer: 59 | Admitting: Family Medicine

## 2018-01-25 ENCOUNTER — Encounter: Payer: Self-pay | Admitting: Family Medicine

## 2018-01-25 VITALS — BP 107/71 | HR 60 | Temp 98.1°F | Ht 71.0 in | Wt 267.6 lb

## 2018-01-25 DIAGNOSIS — G8929 Other chronic pain: Secondary | ICD-10-CM | POA: Diagnosis not present

## 2018-01-25 DIAGNOSIS — M546 Pain in thoracic spine: Secondary | ICD-10-CM

## 2018-01-25 DIAGNOSIS — G588 Other specified mononeuropathies: Secondary | ICD-10-CM | POA: Diagnosis not present

## 2018-01-25 NOTE — Progress Notes (Signed)
BP 107/71 (BP Location: Right Arm, Patient Position: Sitting, Cuff Size: Large)   Pulse 60   Temp 98.1 F (36.7 C) (Oral)   Ht 5\' 11"  (1.803 m)   Wt 267 lb 9.6 oz (121.4 kg)   SpO2 96%   BMI 37.32 kg/m    Subjective:    Patient ID: Justin [name], male    DOB: [birth year], 52 y.o.   MRN: [id number]  HPI: Justin [name] is a 52 y.o. male  Chief Complaint  Patient presents with  . 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 Paper Work   Here today to renew his disability form for his right intercostal neuralgia. 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 Is managed by Neurosurgery and Pain Management. Slowly improving some, states the pain in the superior aspect of the area is minimal now but still having severe, disabling pain in the inferior portion of the area. Does not know at this time when he will be ready to return to work.   Relevant past medical, surgical, family and social history reviewed and updated as indicated. Interim medical history since our last visit reviewed. Allergies and medications reviewed and updated.  Review of Systems  Per HPI unless specifically indicated above     Objective:    BP 107/71 (BP Location: Right Arm, Patient Position: Sitting, Cuff Size: Large)   Pulse 60   Temp 98.1 F (36.7 C) (Oral)   Ht 5\' 11"  (1.803 m)   Wt 267 lb 9.6 oz (121.4 kg)   SpO2 96%   BMI 37.32 kg/m   Wt Readings from Last 3 Encounters:  01/25/18 267 lb 9.6 oz (121.4 kg)  12/29/17 260 lb (117.9 kg)  12/26/17 265 lb 7 oz (120.4 kg)    Physical Exam  Constitutional: He is oriented to person, place, and time. He appears well-developed and well-nourished. No distress.  HENT:  Head: Atraumatic.  Eyes: Conjunctivae and EOM are normal.  Neck: Normal range of motion. Neck supple.    Cardiovascular: Normal rate and regular rhythm.  Pulmonary/Chest: Effort normal and breath sounds normal.  Musculoskeletal: He exhibits tenderness (significant ttp right ribs).  ROM right UE limited, cannot extend to more than about 50 degrees without severe pain limiting him Gait normal Grip strength equal b/l Pain limiting twisting or lateral bend motions on the right  Neurological: He is alert and oriented to person, place, and time.  Skin: Skin is warm and dry.  Psychiatric: He has a normal mood and affect. His behavior is normal.  Nursing note and vitals reviewed.   Results for orders placed or performed in visit on 12/26/17  Comprehensive metabolic panel  Result Value Ref Range   Sodium 136 135 - 145 mmol/L   Potassium 3.9 3.5 - 5.1 mmol/L   Chloride 105 98 - 111 mmol/L   CO2 23 22 - 32 mmol/L   Glucose, Bld 140 (H) 70 - 99 mg/dL   BUN 11 6 - 20 mg/dL   Creatinine, Ser 1.29 (H) 0.61 - 1.24 mg/dL   Calcium 9.1 8.9 - 10.3 mg/dL   Total Protein 7.1 6.5 - 8.1 g/dL   Albumin 3.8 3.5 - 5.0 g/dL   AST 32 15 - 41 U/L   ALT 40 0 - 44 U/L   Alkaline Phosphatase  75 38 - 126 U/L   Total Bilirubin 0.5 0.3 - 1.2 mg/dL   GFR calc non Af Amer >60 >60 mL/min   GFR calc Af Amer >60 >60 mL/min   Anion gap 8 5 - 15  CBC with Differential  Result Value Ref Range   WBC 8.0 3.8 - 10.6 K/uL   RBC 4.40 4.40 - 5.90 MIL/uL   Hemoglobin 13.6 13.0 - 18.0 g/dL   HCT 41.0 40.0 - 52.0 %   MCV 93.1 80.0 - 100.0 fL   MCH 31.0 26.0 - 34.0 pg   MCHC 33.2 32.0 - 36.0 g/dL   RDW 13.7 11.5 - 14.5 %   Platelets 273 150 - 440 K/uL   Neutrophils Relative % 64 %   Neutro Abs 5.1 1.4 - 6.5 K/uL   Lymphocytes Relative 23 %   Lymphs Abs 1.8 1.0 - 3.6 K/uL   Monocytes Relative 10 %   Monocytes Absolute 0.8 0.2 - 1.0 K/uL   Eosinophils Relative 2 %   Eosinophils Absolute 0.2 0 - 0.7 K/uL   Basophils Relative 1 %   Basophils Absolute 0.1 0 - 0.1 K/uL      Assessment & Plan:   Problem List Items  Addressed This Visit      Other   Chronic thoracic back pain (Primary Area of Pain) (Right) (Chronic)   Intercostal neuralgia (T6, T7) (Right) - Primary (Chronic)    Forms completed and scanned into chart. Minimal progress as far as mobility/functionality at this point. Continue working with Pain Clinic, PT, and Neurosurgery.  Continue current medications.   Follow up plan: Return for CPE.

## 2018-01-28 NOTE — Patient Instructions (Signed)
Follow up for CPE 

## 2018-01-30 DIAGNOSIS — D492 Neoplasm of unspecified behavior of bone, soft tissue, and skin: Secondary | ICD-10-CM | POA: Diagnosis not present

## 2018-01-30 DIAGNOSIS — M546 Pain in thoracic spine: Secondary | ICD-10-CM | POA: Diagnosis not present

## 2018-02-02 ENCOUNTER — Other Ambulatory Visit: Payer: Self-pay

## 2018-02-02 ENCOUNTER — Ambulatory Visit: Payer: 59 | Attending: Nurse Practitioner | Admitting: Nurse Practitioner

## 2018-02-02 ENCOUNTER — Encounter: Payer: Self-pay | Admitting: Nurse Practitioner

## 2018-02-02 VITALS — BP 127/81 | HR 87 | Temp 97.8°F | Resp 16 | Ht 71.5 in | Wt 260.0 lb

## 2018-02-02 DIAGNOSIS — Z5181 Encounter for therapeutic drug level monitoring: Secondary | ICD-10-CM | POA: Insufficient documentation

## 2018-02-02 DIAGNOSIS — Z86718 Personal history of other venous thrombosis and embolism: Secondary | ICD-10-CM | POA: Insufficient documentation

## 2018-02-02 DIAGNOSIS — M5442 Lumbago with sciatica, left side: Secondary | ICD-10-CM | POA: Diagnosis not present

## 2018-02-02 DIAGNOSIS — Z791 Long term (current) use of non-steroidal anti-inflammatories (NSAID): Secondary | ICD-10-CM | POA: Insufficient documentation

## 2018-02-02 DIAGNOSIS — G8912 Acute post-thoracotomy pain: Secondary | ICD-10-CM | POA: Diagnosis not present

## 2018-02-02 DIAGNOSIS — M5414 Radiculopathy, thoracic region: Secondary | ICD-10-CM | POA: Diagnosis not present

## 2018-02-02 DIAGNOSIS — Z79899 Other long term (current) drug therapy: Secondary | ICD-10-CM | POA: Diagnosis not present

## 2018-02-02 DIAGNOSIS — G894 Chronic pain syndrome: Secondary | ICD-10-CM | POA: Diagnosis not present

## 2018-02-02 DIAGNOSIS — M546 Pain in thoracic spine: Secondary | ICD-10-CM | POA: Diagnosis not present

## 2018-02-02 DIAGNOSIS — D6862 Lupus anticoagulant syndrome: Secondary | ICD-10-CM | POA: Diagnosis not present

## 2018-02-02 DIAGNOSIS — G8929 Other chronic pain: Secondary | ICD-10-CM

## 2018-02-02 DIAGNOSIS — K219 Gastro-esophageal reflux disease without esophagitis: Secondary | ICD-10-CM | POA: Diagnosis not present

## 2018-02-02 DIAGNOSIS — Z79891 Long term (current) use of opiate analgesic: Secondary | ICD-10-CM | POA: Insufficient documentation

## 2018-02-02 DIAGNOSIS — Z87891 Personal history of nicotine dependence: Secondary | ICD-10-CM | POA: Diagnosis not present

## 2018-02-02 DIAGNOSIS — R1011 Right upper quadrant pain: Secondary | ICD-10-CM | POA: Insufficient documentation

## 2018-02-02 DIAGNOSIS — R0781 Pleurodynia: Secondary | ICD-10-CM | POA: Insufficient documentation

## 2018-02-02 DIAGNOSIS — G8918 Other acute postprocedural pain: Secondary | ICD-10-CM | POA: Insufficient documentation

## 2018-02-02 DIAGNOSIS — E559 Vitamin D deficiency, unspecified: Secondary | ICD-10-CM | POA: Diagnosis not present

## 2018-02-02 DIAGNOSIS — M26601 Right temporomandibular joint disorder, unspecified: Secondary | ICD-10-CM | POA: Insufficient documentation

## 2018-02-02 MED ORDER — OXYCODONE HCL 5 MG PO TABS: 5.0000 mg | ORAL_TABLET | Freq: Three times a day (TID) | ORAL | 0 refills | Status: DC | PRN

## 2018-02-02 NOTE — Progress Notes (Signed)
Nursing Pain Medication Assessment:  Safety precautions to be maintained throughout the outpatient stay will include: orient to surroundings, keep bed in low position, maintain call bell within reach at all times, provide assistance with transfer out of bed and ambulation.  Medication Inspection Compliance: Pill count conducted under aseptic conditions, in front of the patient. Neither the pills nor the bottle was removed from the patient's sight at any time. Once count was completed pills were immediately returned to the patient in their original bottle.  Medication: Oxycodone IR Pill/Patch Count: 8 of 90 pills remain Pill/Patch Appearance: Markings consistent with prescribed medication Bottle Appearance: Standard pharmacy container. Clearly labeled. Filled Date: 8 / 2 / 2019 Last Medication intake:  Today

## 2018-02-02 NOTE — Patient Instructions (Addendum)
You have been given 2 Rx for oxycodone to last until 04/06/2018.____________________________________________________________________________________________  Medication Rules  Applies to: All patients receiving prescriptions (written or electronic).  Pharmacy of record: Pharmacy where electronic prescriptions will be sent. If written prescriptions are taken to a different pharmacy, please inform the nursing staff. The pharmacy listed in the electronic medical record should be the one where you would like electronic prescriptions to be sent.  Prescription refills: Only during scheduled appointments. Applies to both, written and electronic prescriptions.  NOTE: The following applies primarily to controlled substances (Opioid* Pain Medications).   Patient's responsibilities: 1. Pain Pills: Bring all pain pills to every appointment (except for procedure appointments). 2. Pill Bottles: Bring pills in original pharmacy bottle. Always bring newest bottle. Bring bottle, even if empty. 3. Medication refills: You are responsible for knowing and keeping track of what medications you need refilled. The day before your appointment, write a list of all prescriptions that need to be refilled. Bring that list to your appointment and give it to the admitting nurse. Prescriptions will be written only during appointments. If you forget a medication, it will not be "Called in", "Faxed", or "electronically sent". You will need to get another appointment to get these prescribed. 4. Prescription Accuracy: You are responsible for carefully inspecting your prescriptions before leaving our office. Have the discharge nurse carefully go over each prescription with you, before taking them home. Make sure that your name is accurately spelled, that your address is correct. Check the name and dose of your medication to make sure it is accurate. Check the number of pills, and the written instructions to make sure they are clear and  accurate. Make sure that you are given enough medication to last until your next medication refill appointment. 5. Taking Medication: Take medication as prescribed. Never take more pills than instructed. Never take medication more frequently than prescribed. Taking less pills or less frequently is permitted and encouraged, when it comes to controlled substances (written prescriptions).  6. Inform other Doctors: Always inform, all of your healthcare providers, of all the medications you take. 7. Pain Medication from other Providers: You are not allowed to accept any additional pain medication from any other Doctor or Healthcare provider. There are two exceptions to this rule. (see below) In the event that you require additional pain medication, you are responsible for notifying us, as stated below. 8. Medication Agreement: You are responsible for carefully reading and following our Medication Agreement. This must be signed before receiving any prescriptions from our practice. Safely store a copy of your signed Agreement. Violations to the Agreement will result in no further prescriptions. (Additional copies of our Medication Agreement are available upon request.) 9. Laws, Rules, & Regulations: All patients are expected to follow all Federal and [employer], [employer], Rules, [employer]. Ignorance of the Laws does not constitute a valid excuse. The use of any illegal substances is prohibited. 10. Adopted CDC guidelines & recommendations: Target dosing levels will be at or below 60 MME/day. Use of benzodiazepines** is not recommended.  Exceptions: There are only two exceptions to the rule of not receiving pain medications from other Healthcare Providers. 1. Exception #1 (Emergencies): In the event of an emergency (i.e.: accident requiring emergency care), you are allowed to receive additional pain medication. However, you are responsible for: As soon as you are able, call our office [phone], at any  time of the day or night, and leave a message stating your name, the date and nature  of the emergency, and the name and dose of the medication prescribed. In the event that your call is answered by a member of our staff, make sure to document and save the date, time, and the name of the person that took your information.  2. Exception #2 (Planned Surgery): In the event that you are scheduled by another doctor or dentist to have any type of surgery or procedure, you are allowed (for a period no longer than 30 days), to receive additional pain medication, for the acute post-op pain. However, in this case, you are responsible for picking up a copy of our "Post-op Pain Management for Surgeons" handout, and giving it to your surgeon or dentist. This document is available at our office, and does not require an appointment to obtain it. Simply go to our office during business hours (Monday-Thursday from 8:00 AM to 4:00 PM) (Friday 8:00 AM to 12:00 Noon) or if you have a scheduled appointment with [place], prior to your surgery, and ask for it by name. In addition, you will need to provide [place] with your name, name of your surgeon, type of surgery, and date of procedure or surgery.  *Opioid medications include: morphine, codeine, oxycodone, oxymorphone, hydrocodone, hydromorphone, meperidine, tramadol, tapentadol, buprenorphine, fentanyl, methadone. **Benzodiazepine medications include: diazepam (Valium), alprazolam (Xanax), clonazepam (Klonopine), lorazepam (Ativan), clorazepate (Tranxene), chlordiazepoxide (Librium), estazolam (Prosom), oxazepam (Serax), temazepam (Restoril), triazolam (Halcion) (Last updated: 08/04/2017) ____________________________________________________________________________________________  ____________________________________________________________________________________________  Pain Scale  Introduction: The pain score used by this practice is the Verbal Numerical Rating Scale (VNRS-11).  This is an 11-point scale. It is for adults and children 10 years or older. There are significant differences in how the pain score is reported, used, and applied. Forget everything you learned in the past and learn this scoring system.  General Information: The scale should reflect your current level of pain. Unless you are specifically asked for the level of your worst pain, or your average pain. If you are asked for one of these two, then it should be understood that it is over the past 24 hours.  Basic Activities of Daily Living (ADL): Personal hygiene, dressing, eating, transferring, and using restroom.  Instructions: Most patients tend to report their level of pain as a combination of two factors, their physical pain and their psychosocial pain. This last one is also known as "suffering" and it is reflection of how physical pain affects you socially and psychologically. From now on, report them separately. From this point on, when asked to report your pain level, report only your physical pain. Use the following table for reference.  Pain Clinic Pain Levels (0-5/10)  Pain Level Score  Description  No Pain 0   Mild pain 1 Nagging, annoying, but does not interfere with basic activities of daily living (ADL). Patients are able to eat, bathe, get dressed, toileting (being able to get on and off the toilet and perform personal hygiene functions), transfer (move in and out of bed or a chair without assistance), and maintain continence (able to control bladder and bowel functions). Blood pressure and heart rate are unaffected. A normal heart rate for a healthy adult ranges from 60 to 100 bpm (beats per minute).   Mild to moderate pain 2 Noticeable and distracting. Impossible to hide from other people. More frequent flare-ups. Still possible to adapt and function close to normal. It can be very annoying and may have occasional stronger flare-ups. With discipline, patients may get used to it and adapt.    Moderate  pain 3 Interferes significantly with activities of daily living (ADL). It becomes difficult to feed, bathe, get dressed, get on and off the toilet or to perform personal hygiene functions. Difficult to get in and out of bed or a chair without assistance. Very distracting. With effort, it can be ignored when deeply involved in activities.   Moderately severe pain 4 Impossible to ignore for more than a few minutes. With effort, patients may still be able to manage work or participate in some social activities. Very difficult to concentrate. Signs of autonomic nervous system discharge are evident: dilated pupils (mydriasis); mild sweating (diaphoresis); sleep interference. Heart rate becomes elevated (>115 bpm). Diastolic blood pressure (lower number) rises above 100 mmHg. Patients find relief in laying down and not moving.   Severe pain 5 Intense and extremely unpleasant. Associated with frowning face and frequent crying. Pain overwhelms the senses.  Ability to do any activity or maintain social relationships becomes significantly limited. Conversation becomes difficult. Pacing back and forth is common, as getting into a comfortable position is nearly impossible. Pain wakes you up from deep sleep. Physical signs will be obvious: pupillary dilation; increased sweating; goosebumps; brisk reflexes; cold, clammy hands and feet; nausea, vomiting or dry heaves; loss of appetite; significant sleep disturbance with inability to fall asleep or to remain asleep. When persistent, significant weight loss is observed due to the complete loss of appetite and sleep deprivation.  Blood pressure and heart rate becomes significantly elevated. Caution: If elevated blood pressure triggers a pounding headache associated with blurred vision, then the patient should immediately seek attention at an urgent or emergency care unit, as these may be signs of an impending stroke.    Emergency Department Pain Levels (6-10/10)   Emergency Room Pain 6 Severely limiting. Requires emergency care and should not be seen or managed at an outpatient pain management facility. Communication becomes difficult and requires great effort. Assistance to reach the emergency department may be required. Facial flushing and profuse sweating along with potentially dangerous increases in heart rate and blood pressure will be evident.   Distressing pain 7 Self-care is very difficult. Assistance is required to transport, or use restroom. Assistance to reach the emergency department will be required. Tasks requiring coordination, such as bathing and getting dressed become very difficult.   Disabling pain 8 Self-care is no longer possible. At this level, pain is disabling. The individual is unable to do even the most "basic" activities such as walking, eating, bathing, dressing, transferring to a bed, or toileting. Fine motor skills are lost. It is difficult to think clearly.   Incapacitating pain 9 Pain becomes incapacitating. Thought processing is no longer possible. Difficult to remember your own name. Control of movement and coordination are lost.   The worst pain imaginable 10 At this level, most patients pass out from pain. When this level is reached, collapse of the autonomic nervous system occurs, leading to a sudden drop in blood pressure and heart rate. This in turn results in a temporary and dramatic drop in blood flow to the brain, leading to a loss of consciousness. Fainting is one of the body's self defense mechanisms. Passing out puts the brain in a calmed state and causes it to shut down for a while, in order to begin the healing process.    Summary: 1. Refer to this scale when providing [place] with your pain level. 2. Be accurate and careful when reporting your pain level. This will help with your care. 3. Over-reporting your  pain level will lead to loss of credibility. 4. Even a level of 1/10 means that there is pain and will be  treated at our facility. 5. High, inaccurate reporting will be documented as "Symptom Exaggeration", leading to loss of credibility and suspicions of possible secondary gains such as obtaining more narcotics, or wanting to appear disabled, for fraudulent reasons. 6. Only pain levels of 5 or below will be seen at our facility. 7. Pain levels of 6 and above will be sent to the Emergency Department and the appointment cancelled. ____________________________________________________________________________________________     BMI Assessment: Estimated body mass index is 35.76 kg/m as calculated from the following:   Height as of this encounter: 5' 11.5" (1.816 m).   Weight as of this encounter: 260 lb (117.9 kg).  BMI interpretation table: BMI level Category Range association with higher incidence of chronic pain  <18 kg/m2 Underweight   18.5-24.9 kg/m2 Ideal body weight   25-29.9 kg/m2 Overweight Increased incidence by 20%  30-34.9 kg/m2 Obese (Class I) Increased incidence by 68%  35-39.9 kg/m2 Severe obesity (Class II) Increased incidence by 136%  >40 kg/m2 Extreme obesity (Class III) Increased incidence by 254%   Patient's current BMI Ideal Body weight  Body mass index is 35.76 kg/m. Ideal body weight: 76.5 kg (168 lb 8.7 oz) Adjusted ideal body weight: 93 kg (205 lb 2 oz)   BMI Readings from Last 4 Encounters:  02/02/18 35.76 kg/m  01/25/18 37.32 kg/m  12/29/17 36.26 kg/m  12/26/17 37.02 kg/m   Wt Readings from Last 4 Encounters:  02/02/18 260 lb (117.9 kg)  01/25/18 267 lb 9.6 oz (121.4 kg)  12/29/17 260 lb (117.9 kg)  12/26/17 265 lb 7 oz (120.4 kg)

## 2018-02-02 NOTE — Progress Notes (Signed)
Patient's Name: Justin [name]  MRN: [id number]  Referring Provider: Valerie Roys, DO  DOB: 1965-12-09  PCP: Valerie Roys, DO  DOS: 02/02/2018  Note by: Vevelyn Francois NP  Service setting: Ambulatory outpatient  Specialty: Interventional Pain Management  Location: [facility] (AMB) Pain Management Facility    Patient type: Established    Primary Reason(s) for Visit: Encounter for prescription drug management & post-procedure evaluation of chronic illness with mild to moderate exacerbation(Level of risk: moderate) CC: Flank Pain (right)  HPI  Justin [name] is a 52 y.o. year old, male patient, who comes today for a post-procedure evaluation and medication management. He has Abnormal finding on EKG; Atypical chest pain; Left subclavian vein thrombosis ([place]); Pain of left scapula; Dyspnea; Lupus anticoagulant positive; DVT (deep venous thrombosis) ([place]); Thoracic outlet syndrome; TMJ tenderness, right; Left-sided low back pain with left-sided sciatica; Radiculopathy of thoracic region; Chronic thoracic back pain (Primary Area of Pain) (Right); Thoracic radiculitis (Secondary Area of Pain); Chronic pain syndrome; Long term current use of opiate analgesic; Long term current use of anticoagulant (Lovenox); Pharmacologic therapy; Disorder of skeletal system; Rib pain on right side (Fourth Area of Pain); Right upper quadrant abdominal pain (Tertiary Area of Pain); Elevated C-reactive protein (CRP); Elevated sed rate; Vitamin D deficiency; 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 His primarily concern today is the Flank Pain (right)  Pain Assessment: Location: Right Flank Radiating: denies Onset: More than a month ago Duration: Chronic pain Quality: Aching, Squeezing, Constant, Pressure Severity: 7 /10 (subjective, self-reported pain score)   Note: Reported level is compatible with observation. Clinically the patient looks like a 1/10 A 1/10 is viewed as "Mild" and described as nagging, annoying, but not interfering with basic activities of daily living (ADL). Justin [name] is able to eat, bathe, get dressed, do toileting (being able to get on and off the toilet and perform personal hygiene functions), transfer (move in and out of bed or a chair without assistance), and maintain continence (able to control bladder and bowel functions). Physiologic parameters such as blood pressure and heart rate apear wnl.       When using our objective Pain Scale, levels between 6 and 10/10 are said to belong in an emergency room, as it progressively worsens from a 6/10, described as severely limiting, requiring emergency care not usually available at an outpatient pain management facility. At a 6/10 level, communication becomes difficult and requires great effort. Assistance to reach the emergency department may be required. Facial flushing and profuse sweating along with potentially dangerous increases in heart rate and blood pressure will be evident. Effect on ADL: difficult to lay on right side; uses pillow for comfort Timing: Constant Modifying factors: meds BP: 127/81  HR: 87  Justin [name] was last seen on 12/29/2017 for a procedure. During today's appointment we reviewed Justin [name] post-procedure results, as well as his outpatient medication regimen.  He admits that his bottom ribs continue to hurt. He admits that using the medication is somewhat effective.  Further details on both, my assessment(s), as well as the proposed treatment plan, please see below.  Controlled Substance Pharmacotherapy Assessment REMS (Risk Evaluation and Mitigation Strategy)  Analgesic:OxycodoneIR5 mg1 tablets3times daily(oxycodone72m/day) MME/day:22.564mday [name]  02/02/2018 11:04 AM  Signed Nursing Pain Medication Assessment:  Safety  precautions to be maintained throughout the outpatient stay will include: orient to surroundings,  keep bed in low position, maintain call bell within reach at all times, provide assistance with transfer out of bed and ambulation.  Medication Inspection Compliance: Pill count conducted under aseptic conditions, in front of the patient. Neither the pills nor the bottle was removed from the patient's sight at any time. Once count was completed pills were immediately returned to the patient in their original bottle.  Medication: Oxycodone IR Pill/Patch Count: 8 of 90 pills remain Pill/Patch Appearance: Markings consistent with prescribed medication Bottle Appearance: Standard pharmacy container. Clearly labeled. Filled Date: 8 / 2 / 2019 Last Medication intake:  Today   Pharmacokinetics: Liberation and absorption (onset of action): WNL Distribution (time to peak effect): WNL Metabolism and excretion (duration of action): WNL         Pharmacodynamics: Desired effects: Analgesia: Justin [name] reports >50% benefit. Functional ability: Patient reports that medication allows him to accomplish basic ADLs Clinically meaningful improvement in function (CMIF): Sustained CMIF goals met Perceived effectiveness: Described as relatively effective, allowing for increase in activities of daily living (ADL) Undesirable effects: Side-effects or Adverse reactions: None reported Monitoring: [place] PMP: Online review of the past 46-monthperiod conducted. Compliant with practice rules and regulations Last UDS on record: Summary  Date Value Ref Range Status  07/28/2017 FINAL  Final    Comment:    ==================================================================== TOXAS[facility]RE COMP DRUG ANALYSIS,UR ==================================================================== Test                             Result       Flag       Units Drug Present and Declared for Prescription Verification   Oxycodone                       660          EXPECTED   ng/mg creat   Oxymorphone                    185          EXPECTED   ng/mg creat   Noroxycodone                   609          EXPECTED   ng/mg creat   Noroxymorphone                 50           EXPECTED   ng/mg creat    Sources of oxycodone are scheduled prescription medications.    Oxymorphone, noroxycodone, and noroxymorphone are expected    metabolites of oxycodone. Oxymorphone is also available as a    scheduled prescription medication.   Gabapentin                     PRESENT      EXPECTED Drug Present not Declared for Prescription Verification   Acetaminophen                  PRESENT      UNEXPECTED   Diphenhydramine                PRESENT      UNEXPECTED Drug Absent but Declared for Prescription Verification   Topiramate                     Not Detected UNEXPECTED ==================================================================== Test  Result    Flag   Units      Ref Range   Creatinine              222              mg/dL      >=20 ==================================================================== Declared Medications:  The flagging and interpretation on this report are based on the  following declared medications.  Unexpected results may arise from  inaccuracies in the declared medications.  **Note: The testing scope of this panel includes these medications:  Gabapentin  Oxycodone  Topiramate (Topamax)  **Note: The testing scope of this panel does not include following  reported medications:  Enoxaparin (Lovenox) ==================================================================== For clinical consultation, please call [phone]. ====================================================================    UDS interpretation: Compliant          Medication Assessment Form: Reviewed. Patient indicates being compliant with therapy Treatment compliance: Compliant Risk Assessment Profile: Aberrant behavior: See prior  evaluations. None observed or detected today Comorbid factors increasing risk of overdose: See prior notes. No additional risks detected today Opioid risk tool (ORT) (Total Score): 0 Personal History of Substance Abuse (SUD-Substance use disorder):  Alcohol: Negative  Illegal Drugs: Negative  Rx Drugs: Negative  ORT Risk Level calculation: Low Risk Risk of substance use disorder (SUD): Low Opioid Risk Tool - 02/02/18 1105      Family History of Substance Abuse   Alcohol  Negative    Illegal Drugs  Negative    Rx Drugs  Negative      Personal History of Substance Abuse   Alcohol  Negative    Illegal Drugs  Negative    Rx Drugs  Negative      Age   Age between 40-45 years   No      History of Preadolescent Sexual Abuse   History of Preadolescent Sexual Abuse  Negative or Male      Psychological Disease   Psychological Disease  Negative    Depression  Negative      Total Score   Opioid Risk Tool Scoring  0    Opioid Risk Interpretation  Low Risk      ORT Scoring interpretation table:  Score <3 = Low Risk for SUD  Score between 4-7 = Moderate Risk for SUD  Score >8 = High Risk for Opioid Abuse   Risk Mitigation Strategies:  Patient Counseling: Covered Patient-Prescriber Agreement (PPA): Present and active  Notification to other healthcare providers: Done  Pharmacologic Plan: No change in therapy, at this time.             Post-Procedure Assessment  12/29/2017 Procedure: Right-sided intercostal radiofrequency ablation Pre-procedure pain score:  7/10 Post-procedure pain score: 0/10         Influential Factors: BMI: 35.76 kg/m Intra-procedural challenges: None observed.         Assessment challenges: None detected.              Reported side-effects: None.        Post-procedural adverse reactions or complications: None reported         Sedation: Please see nurses note. When no sedatives are used, the analgesic levels obtained are directly associated to the  effectiveness of the local anesthetics. However, when sedation is provided, the level of analgesia obtained during the initial 1 hour following the intervention, is believed to be the result of a combination of factors. These factors may include, but are not limited to: 1. The effectiveness of the local  anesthetics used. 2. The effects of the analgesic(s) and/or anxiolytic(s) used. 3. The degree of discomfort experienced by the patient at the time of the procedure. 4. The patients ability and reliability in recalling and recording the events. 5. The presence and influence of possible secondary gains and/or psychosocial factors. Reported result: Relief experienced during the 1st hour after the procedure: 100 % (Ultra-Short Term Relief)            Interpretative annotation: Clinically appropriate result. Analgesia during this period is likely to be Local Anesthetic and/or IV Sedative (Analgesic/Anxiolytic) related.          Effects of local anesthetic: The analgesic effects attained during this period are directly associated to the localized infiltration of local anesthetics and therefore cary significant diagnostic value as to the etiological location, or anatomical origin, of the pain. Expected duration of relief is directly dependent on the pharmacodynamics of the local anesthetic used. Long-acting (4-6 hours) anesthetics used.  Reported result: Relief during the next 4 to 6 hour after the procedure: 100 % (Short-Term Relief)            Interpretative annotation: Clinically appropriate result. Analgesia during this period is likely to be Local Anesthetic-related.          Long-term benefit: Defined as the period of time past the expected duration of local anesthetics (1 hour for short-acting and 4-6 hours for long-acting). With the possible exception of prolonged sympathetic blockade from the local anesthetics, benefits during this period are typically attributed to, or associated with, other factors  such as analgesic sensory neuropraxia, antiinflammatory effects, or beneficial biochemical changes provided by agents other than the local anesthetics.  Reported result: Extended relief following procedure: 30 %(100% relief lasted 2 days) (Long-Term Relief)            Interpretative annotation: Clinically possible results. Good relief. No permanent benefit expected. Inflammation plays a part in the etiology to the pain.          Current benefits: Defined as reported results that persistent at this point in time.   Analgesia: <25 %            Function: Back to baseline ROM: Back to baseline Interpretative annotation: Recurrence of symptoms. Limited therapeutic benefit. Effective diagnostic intervention.          Interpretation: Results would suggest failure of therapy in achieving desired goal(s).                  Plan:  Please see "Plan of Care" for details.                Laboratory Chemistry  Inflammation Markers (CRP: Acute Phase) (ESR: Chronic Phase) Lab Results  Component Value Date   CRP 11.4 (H) 07/28/2017   ESRSEDRATE 40 (H) 07/28/2017   LATICACIDVEN 0.9 07/20/2016                         Rheumatology Markers Lab Results  Component Value Date   ANA Negative 07/28/2017                        Renal Function Markers Lab Results  Component Value Date   BUN 11 12/26/2017   CREATININE 1.29 (H) 12/26/2017   BCR 9 09/08/2017   GFRAA >60 12/26/2017   GFRNONAA >60 12/26/2017  Hepatic Function Markers Lab Results  Component Value Date   AST 32 12/26/2017   ALT 40 12/26/2017   ALBUMIN 3.8 12/26/2017   ALKPHOS 75 12/26/2017   LIPASE 90 03/02/2014                        Electrolytes Lab Results  Component Value Date   NA 136 12/26/2017   K 3.9 12/26/2017   CL 105 12/26/2017   CALCIUM 9.1 12/26/2017   MG 2.0 07/28/2017                        Neuropathy Markers Lab Results  Component Value Date   VITAMINB12 391 07/28/2017                         Bone Pathology Markers Lab Results  Component Value Date   25OHVITD1 10 (L) 07/28/2017   25OHVITD2 3.2 07/28/2017   25OHVITD3 6.8 07/28/2017                         Coagulation Parameters Lab Results  Component Value Date   INR 1.03 07/20/2016   LABPROT 13.5 07/20/2016   APTT 45 (H) 07/20/2016   PLT 273 12/26/2017                        Cardiovascular Markers Lab Results  Component Value Date   BNP 18.0 07/20/2016   TROPONINI <0.03 12/27/2016   HGB 13.6 12/26/2017   HCT 41.0 12/26/2017                         CA Markers No results found for: CEA, CA125, LABCA2                      Note: Lab results reviewed.  Recent Diagnostic Imaging Results  DG C-Arm 1-60 Min-No Report Fluoroscopy was utilized by the requesting physician.  No radiographic  interpretation.   Complexity Note: Imaging results reviewed. Results shared with Mr. Roughton, using Layman's terms.                         Meds   Current Outpatient Medications:  .  acetaminophen (TYLENOL) 500 MG tablet, Take 1,000 mg by mouth every 6 (six) hours as needed., Disp: , Rfl:  .  atorvastatin (LIPITOR) 10 MG tablet, Take 1 tablet (10 mg total) by mouth daily., Disp: 90 tablet, Rfl: 1 .  Calcium Carbonate-Vit D-Min (GNP CALCIUM 1200) 1200-1000 MG-UNIT CHEW, Chew 1,200 mg by mouth daily with breakfast. Take in combination with vitamin D and magnesium., Disp: 30 tablet, Rfl: 5 .  enoxaparin (LOVENOX) 150 MG/ML injection, INJECT 0.74 MLS (110 MG TOTAL) INTO THE SKIN 2 (TWO) TIMES DAILY, Disp: 60 Syringe, Rfl: 0 .  gabapentin (NEURONTIN) 300 MG capsule, Take 1 capsule (300 mg total) by mouth 3 (three) times daily. Take 1 tab TID, can take an extra tab at bedtime (Patient taking differently: Take 300 mg by mouth 3 (three) times daily. Take 1 tab TID, can take an extra tab at bedtime), Disp: 120 capsule, Rfl: 1 .  ibuprofen (ADVIL,MOTRIN) 800 MG tablet, Take 800 mg by mouth every 8 (eight) hours as needed., Disp: ,  Rfl:  .  Magnesium 500 MG CAPS, Take 1 capsule (500 mg total) by mouth  2 (two) times daily at 8 am and 10 pm., Disp: 60 capsule, Rfl: 5 .  [START ON 03/07/2018] oxyCODONE (OXY IR/ROXICODONE) 5 MG immediate release tablet, Take 1 tablet (5 mg total) by mouth every 8 (eight) hours as needed for severe pain., Disp: 90 tablet, Rfl: 0 .  [START ON 02/05/2018] oxyCODONE (OXY IR/ROXICODONE) 5 MG immediate release tablet, Take 1 tablet (5 mg total) by mouth every 8 (eight) hours as needed for severe pain., Disp: 90 tablet, Rfl: 0  ROS  Constitutional: Denies any fever or chills Gastrointestinal: No reported hemesis, hematochezia, vomiting, or acute GI distress Musculoskeletal: Denies any acute onset joint swelling, redness, loss of ROM, or weakness Neurological: No reported episodes of acute onset apraxia, aphasia, dysarthria, agnosia, amnesia, paralysis, loss of coordination, or loss of consciousness  Allergies  Mr. Durnil has No Known Allergies.  [place]  Drug: Mr. Kau  reports that he does not use drugs. Alcohol:  reports that he does not drink alcohol. Tobacco:  reports that he has quit smoking. He smoked 0.50 packs per day. He has never used smokeless tobacco. Medical:  has a past medical history of Arm vein blood clot, DVT (deep venous thrombosis) ([place]) (2012; January 2016), GERD (gastroesophageal reflux disease), Rib pain on right side (08/04/2016), and Vascular thoracic outlet syndrome. Surgical: Mr. Colquhoun  has a past surgical history that includes Knee cartilage surgery; Carotid angiogram; Scalenotomy w/o resection cervical rib (2012); and Right upper extremity thrombectomy (2012). Family: family history includes Breast cancer (age of onset: 34) in his father; Cancer in his maternal grandmother; Diabetes in his father; Heart attack in his father; Heart disease in his father; Leukemia in his maternal grandfather; Prostate cancer in his maternal uncle.  Constitutional Exam  General appearance:  Well nourished, well developed, and well hydrated. In no apparent acute distress Vitals:   02/02/18 1055  BP: 127/81  Pulse: 87  Resp: 16  Temp: 97.8 F (36.6 C)  TempSrc: Oral  SpO2: 99%  Weight: 260 lb (117.9 kg)  Height: 5' 11.5" (1.816 m)   BMI Assessment: Estimated body mass index is 35.76 kg/m as calculated from the following:   Height as of this encounter: 5' 11.5" (1.816 m).   Weight as of this encounter: 260 lb (117.9 kg). Psych/Mental status: Alert, oriented x 3 (person, place, & time)       Eyes: PERLA Respiratory: No evidence of acute respiratory distress   Thoracic Spine Area Exam  Skin & Axial Inspection: Well healed scar from previous spine surgery detected Alignment: Symmetrical Functional ROM: Unrestricted ROM Stability: No instability detected Muscle Tone/Strength: Functionally intact. No obvious neuro-muscular anomalies detected. Sensory (Neurological): Unimpaired Muscle strength & Tone: No palpable anomalies  Lumbar Spine Area Exam  Skin & Axial Inspection: No masses, redness, or swelling Alignment: Symmetrical Functional ROM: Unrestricted ROM       Stability: No instability detected Muscle Tone/Strength: Functionally intact. No obvious neuro-muscular anomalies detected. Sensory (Neurological): Unimpaired Palpation: No palpable anomalies       Provocative Tests: Hyperextension/rotation test: deferred today       Lumbar quadrant test (Kemp's test): deferred today       Lateral bending test: deferred today       Patrick's Maneuver: deferred today                   FABER test: deferred today                   S-I anterior distraction/compression test:  deferred today         S-I lateral compression test: deferred today         S-I Thigh-thrust test: deferred today         S-I Gaenslen's test: deferred today          Gait & Posture Assessment  Ambulation: Unassisted Gait: Relatively normal for age and body habitus Posture: WNL   Lower Extremity  Exam    Side: Right lower extremity  Side: Left lower extremity  Stability: No instability observed          Stability: No instability observed          Skin & Extremity Inspection: Skin color, temperature, and hair growth are WNL. No peripheral edema or cyanosis. No masses, redness, swelling, asymmetry, or associated skin lesions. No contractures.  Skin & Extremity Inspection: Skin color, temperature, and hair growth are WNL. No peripheral edema or cyanosis. No masses, redness, swelling, asymmetry, or associated skin lesions. No contractures.  Functional ROM: Unrestricted ROM                  Functional ROM: Unrestricted ROM                  Muscle Tone/Strength: Functionally intact. No obvious neuro-muscular anomalies detected.  Muscle Tone/Strength: Functionally intact. No obvious neuro-muscular anomalies detected.  Sensory (Neurological): Unimpaired  Sensory (Neurological): Unimpaired  Palpation: No palpable anomalies  Palpation: No palpable anomalies   Assessment  Primary Diagnosis & Pertinent Problem List: The primary encounter diagnosis was Long term prescription opiate use. A diagnosis of Chronic pain syndrome was also pertinent to this visit.  Status Diagnosis  Controlled Controlled Controlled 1. Long term prescription opiate use   2. Chronic pain syndrome     Problems updated and reviewed during this visit: No problems updated. Plan of Care  Pharmacotherapy (Medications Ordered): Meds ordered this encounter  Medications  . oxyCODONE (OXY IR/ROXICODONE) 5 MG immediate release tablet    Sig: Take 1 tablet (5 mg total) by mouth every 8 (eight) hours as needed for severe pain.    Dispense:  90 tablet    Refill:  0    Do not place this medication, or any other prescription from our practice, on "Automatic Refill". Patient may have prescription filled one day early if pharmacy is closed on scheduled refill date. Do not fill until:03/07/2018 To last until: 04/06/2018    Order  Specific Question:   Supervising Provider    Answer:   Milinda Pointer [phone]  . oxyCODONE (OXY IR/ROXICODONE) 5 MG immediate release tablet    Sig: Take 1 tablet (5 mg total) by mouth every 8 (eight) hours as needed for severe pain.    Dispense:  90 tablet    Refill:  0    Do not place this medication on "Automatic Refill". Patient may have prescription filled one day early if pharmacy is closed on scheduled refill date. Do not fill until:02/05/2018 To last until: 03/07/2018    Order Specific Question:   Supervising Provider    Answer:   Milinda Pointer [phone]   New Prescriptions   No medications on file   Medications administered today: [name] had no medications administered during this visit. Lab-work, procedure(s), and/or referral(s): Orders Placed This Encounter  Procedures  . ToxASSURE Select 13 (MW), Urine   Imaging and/or referral(s): None  Interventional therapies: Planned, scheduled, and/or pending:   Not at this time.    Provider-requested follow-up: Return in  about 2 months (around 04/04/2018) for MedMgmt with Me Donella Stade Edison Pace).  Future Appointments  Date Time Provider [place]  03/29/2018 12:45 PM Vevelyn Francois, NP [facility] None  03/30/2018  2:00 PM [facility] None  03/30/2018  2:30 PM Lequita Asal, MD [facility] None  09/14/2018  1:00 PM Volney American, PA-C [facility]   Primary Care Physician: Valerie Roys, DO Location: [facility] Out[facility] Facility Note by: Vevelyn Francois NP Date: 02/02/2018; Time: 12:50 PM  Pain Score Disclaimer: We use the NRS-11 scale. This is a self-reported, subjective measurement of pain severity with only modest accuracy. It is used primarily to identify changes within a particular patient. It must be understood that outpatient pain scales are significantly less accurate that those used for research, where they can be applied under ideal controlled circumstances  with minimal exposure to variables. In reality, the score is likely to be a combination of pain intensity and pain affect, where pain affect describes the degree of emotional arousal or changes in action readiness caused by the sensory experience of pain. Factors such as social and work situation, setting, emotional state, anxiety levels, expectation, and prior pain experience may influence pain perception and show large inter-individual differences that may also be affected by time variables.  Patient instructions provided during this appointment: Patient Instructions   You have been given 2 Rx for oxycodone to last until 04/06/2018.____________________________________________________________________________________________  Medication Rules  Applies to: All patients receiving prescriptions (written or electronic).  Pharmacy of record: Pharmacy where electronic prescriptions will be sent. If written prescriptions are taken to a different pharmacy, please inform the nursing staff. The pharmacy listed in the electronic medical record should be the one where you would like electronic prescriptions to be sent.  Prescription refills: Only during scheduled appointments. Applies to both, written and electronic prescriptions.  NOTE: The following applies primarily to controlled substances (Opioid* Pain Medications).   Patient's responsibilities: 1. Pain Pills: Bring all pain pills to every appointment (except for procedure appointments). 2. Pill Bottles: Bring pills in original pharmacy bottle. Always bring newest bottle. Bring bottle, even if empty. 3. Medication refills: You are responsible for knowing and keeping track of what medications you need refilled. The day before your appointment, write a list of all prescriptions that need to be refilled. Bring that list to your appointment and give it to the admitting nurse. Prescriptions will be written only during appointments. If you forget a  medication, it will not be "Called in", "Faxed", or "electronically sent". You will need to get another appointment to get these prescribed. 4. Prescription Accuracy: You are responsible for carefully inspecting your prescriptions before leaving our office. Have the discharge nurse carefully go over each prescription with you, before taking them home. Make sure that your name is accurately spelled, that your address is correct. Check the name and dose of your medication to make sure it is accurate. Check the number of pills, and the written instructions to make sure they are clear and accurate. Make sure that you are given enough medication to last until your next medication refill appointment. 5. Taking Medication: Take medication as prescribed. Never take more pills than instructed. Never take medication more frequently than prescribed. Taking less pills or less frequently is permitted and encouraged, when it comes to controlled substances (written prescriptions).  6. Inform other Doctors: Always inform, all of your healthcare providers, of all the medications you take. 7. Pain Medication from other Providers: You  are not allowed to accept any additional pain medication from any other Doctor or Healthcare provider. There are two exceptions to this rule. (see below) In the event that you require additional pain medication, you are responsible for notifying us, as stated below. 8. Medication Agreement: You are responsible for carefully reading and following our Medication Agreement. This must be signed before receiving any prescriptions from our practice. Safely store a copy of your signed Agreement. Violations to the Agreement will result in no further prescriptions. (Additional copies of our Medication Agreement are available upon request.) 9. Laws, Rules, & Regulations: All patients are expected to follow all Federal and [employer], [employer], Rules, [employer]. Ignorance of the Laws does not constitute a  valid excuse. The use of any illegal substances is prohibited. 10. Adopted CDC guidelines & recommendations: Target dosing levels will be at or below 60 MME/day. Use of benzodiazepines** is not recommended.  Exceptions: There are only two exceptions to the rule of not receiving pain medications from other Healthcare Providers. 1. Exception #1 (Emergencies): In the event of an emergency (i.e.: accident requiring emergency care), you are allowed to receive additional pain medication. However, you are responsible for: As soon as you are able, call our office [phone], at any time of the day or night, and leave a message stating your name, the date and nature of the emergency, and the name and dose of the medication prescribed. In the event that your call is answered by a member of our staff, make sure to document and save the date, time, and the name of the person that took your information.  2. Exception #2 (Planned Surgery): In the event that you are scheduled by another doctor or dentist to have any type of surgery or procedure, you are allowed (for a period no longer than 30 days), to receive additional pain medication, for the acute post-op pain. However, in this case, you are responsible for picking up a copy of our "Post-op Pain Management for Surgeons" handout, and giving it to your surgeon or dentist. This document is available at our office, and does not require an appointment to obtain it. Simply go to our office during business hours (Monday-Thursday from 8:00 AM to 4:00 PM) (Friday 8:00 AM to 12:00 Noon) or if you have a scheduled appointment with [place], prior to your surgery, and ask for it by name. In addition, you will need to provide [place] with your name, name of your surgeon, type of surgery, and date of procedure or surgery.  *Opioid medications include: morphine, codeine, oxycodone, oxymorphone, hydrocodone, hydromorphone, meperidine, tramadol, tapentadol, buprenorphine, fentanyl,  methadone. **Benzodiazepine medications include: diazepam (Valium), alprazolam (Xanax), clonazepam (Klonopine), lorazepam (Ativan), clorazepate (Tranxene), chlordiazepoxide (Librium), estazolam (Prosom), oxazepam (Serax), temazepam (Restoril), triazolam (Halcion) (Last updated: 08/04/2017) ____________________________________________________________________________________________  ____________________________________________________________________________________________  Pain Scale  Introduction: The pain score used by this practice is the Verbal Numerical Rating Scale (VNRS-11). This is an 11-point scale. It is for adults and children 10 years or older. There are significant differences in how the pain score is reported, used, and applied. Forget everything you learned in the past and learn this scoring system.  General Information: The scale should reflect your current level of pain. Unless you are specifically asked for the level of your worst pain, or your average pain. If you are asked for one of these two, then it should be understood that it is over the past 24 hours.  Basic Activities of Daily Living (ADL): Personal hygiene, dressing,  eating, transferring, and using restroom.  Instructions: Most patients tend to report their level of pain as a combination of two factors, their physical pain and their psychosocial pain. This last one is also known as "suffering" and it is reflection of how physical pain affects you socially and psychologically. From now on, report them separately. From this point on, when asked to report your pain level, report only your physical pain. Use the following table for reference.  Pain Clinic Pain Levels (0-5/10)  Pain Level Score  Description  No Pain 0   Mild pain 1 Nagging, annoying, but does not interfere with basic activities of daily living (ADL). Patients are able to eat, bathe, get dressed, toileting (being able to get on and off the toilet and  perform personal hygiene functions), transfer (move in and out of bed or a chair without assistance), and maintain continence (able to control bladder and bowel functions). Blood pressure and heart rate are unaffected. A normal heart rate for a healthy adult ranges from 60 to 100 bpm (beats per minute).   Mild to moderate pain 2 Noticeable and distracting. Impossible to hide from other people. More frequent flare-ups. Still possible to adapt and function close to normal. It can be very annoying and may have occasional stronger flare-ups. With discipline, patients may get used to it and adapt.   Moderate pain 3 Interferes significantly with activities of daily living (ADL). It becomes difficult to feed, bathe, get dressed, get on and off the toilet or to perform personal hygiene functions. Difficult to get in and out of bed or a chair without assistance. Very distracting. With effort, it can be ignored when deeply involved in activities.   Moderately severe pain 4 Impossible to ignore for more than a few minutes. With effort, patients may still be able to manage work or participate in some social activities. Very difficult to concentrate. Signs of autonomic nervous system discharge are evident: dilated pupils (mydriasis); mild sweating (diaphoresis); sleep interference. Heart rate becomes elevated (>115 bpm). Diastolic blood pressure (lower number) rises above 100 mmHg. Patients find relief in laying down and not moving.   Severe pain 5 Intense and extremely unpleasant. Associated with frowning face and frequent crying. Pain overwhelms the senses.  Ability to do any activity or maintain social relationships becomes significantly limited. Conversation becomes difficult. Pacing back and forth is common, as getting into a comfortable position is nearly impossible. Pain wakes you up from deep sleep. Physical signs will be obvious: pupillary dilation; increased sweating; goosebumps; brisk reflexes; cold, clammy  hands and feet; nausea, vomiting or dry heaves; loss of appetite; significant sleep disturbance with inability to fall asleep or to remain asleep. When persistent, significant weight loss is observed due to the complete loss of appetite and sleep deprivation.  Blood pressure and heart rate becomes significantly elevated. Caution: If elevated blood pressure triggers a pounding headache associated with blurred vision, then the patient should immediately seek attention at an urgent or emergency care unit, as these may be signs of an impending stroke.    Emergency Department Pain Levels (6-10/10)  Emergency Room Pain 6 Severely limiting. Requires emergency care and should not be seen or managed at an outpatient pain management facility. Communication becomes difficult and requires great effort. Assistance to reach the emergency department may be required. Facial flushing and profuse sweating along with potentially dangerous increases in heart rate and blood pressure will be evident.   Distressing pain 7 Self-care is very difficult. Assistance is  required to transport, or use restroom. Assistance to reach the emergency department will be required. Tasks requiring coordination, such as bathing and getting dressed become very difficult.   Disabling pain 8 Self-care is no longer possible. At this level, pain is disabling. The individual is unable to do even the most "basic" activities such as walking, eating, bathing, dressing, transferring to a bed, or toileting. Fine motor skills are lost. It is difficult to think clearly.   Incapacitating pain 9 Pain becomes incapacitating. Thought processing is no longer possible. Difficult to remember your own name. Control of movement and coordination are lost.   The worst pain imaginable 10 At this level, most patients pass out from pain. When this level is reached, collapse of the autonomic nervous system occurs, leading to a sudden drop in blood pressure and heart  rate. This in turn results in a temporary and dramatic drop in blood flow to the brain, leading to a loss of consciousness. Fainting is one of the body's self defense mechanisms. Passing out puts the brain in a calmed state and causes it to shut down for a while, in order to begin the healing process.    Summary: 1. Refer to this scale when providing [place] with your pain level. 2. Be accurate and careful when reporting your pain level. This will help with your care. 3. Over-reporting your pain level will lead to loss of credibility. 4. Even a level of 1/10 means that there is pain and will be treated at our facility. 5. High, inaccurate reporting will be documented as "Symptom Exaggeration", leading to loss of credibility and suspicions of possible secondary gains such as obtaining more narcotics, or wanting to appear disabled, for fraudulent reasons. 6. Only pain levels of 5 or below will be seen at our facility. 7. Pain levels of 6 and above will be sent to the Emergency Department and the appointment cancelled. ____________________________________________________________________________________________     BMI Assessment: Estimated body mass index is 35.76 kg/m as calculated from the following:   Height as of this encounter: 5' 11.5" (1.816 m).   Weight as of this encounter: 260 lb (117.9 kg).  BMI interpretation table: BMI level Category Range association with higher incidence of chronic pain  <18 kg/m2 Underweight   18.5-24.9 kg/m2 Ideal body weight   25-29.9 kg/m2 Overweight Increased incidence by 20%  30-34.9 kg/m2 Obese (Class I) Increased incidence by 68%  35-39.9 kg/m2 Severe obesity (Class II) Increased incidence by 136%  >40 kg/m2 Extreme obesity (Class III) Increased incidence by 254%   Patient's current BMI Ideal Body weight  Body mass index is 35.76 kg/m. Ideal body weight: 76.5 kg (168 lb 8.7 oz) Adjusted ideal body weight: 93 kg (205 lb 2 oz)   BMI Readings from Last  4 Encounters:  02/02/18 35.76 kg/m  01/25/18 37.32 kg/m  12/29/17 36.26 kg/m  12/26/17 37.02 kg/m   Wt Readings from Last 4 Encounters:  02/02/18 260 lb (117.9 kg)  01/25/18 267 lb 9.6 oz (121.4 kg)  12/29/17 260 lb (117.9 kg)  12/26/17 265 lb 7 oz (120.4 kg)  He admits that his bottom ribs continue to heur. He admits that using th medication is somewhat effective.

## 2018-02-08 LAB — TOXASSURE SELECT 13 (MW), URINE

## 2018-02-14 DIAGNOSIS — M546 Pain in thoracic spine: Secondary | ICD-10-CM | POA: Diagnosis not present

## 2018-02-17 DIAGNOSIS — M546 Pain in thoracic spine: Secondary | ICD-10-CM | POA: Diagnosis not present

## 2018-02-28 DIAGNOSIS — M546 Pain in thoracic spine: Secondary | ICD-10-CM | POA: Diagnosis not present

## 2018-03-03 DIAGNOSIS — M546 Pain in thoracic spine: Secondary | ICD-10-CM | POA: Diagnosis not present

## 2018-03-09 DIAGNOSIS — M546 Pain in thoracic spine: Secondary | ICD-10-CM | POA: Diagnosis not present

## 2018-03-10 DIAGNOSIS — M546 Pain in thoracic spine: Secondary | ICD-10-CM | POA: Diagnosis not present

## 2018-03-13 DIAGNOSIS — M5124 Other intervertebral disc displacement, thoracic region: Secondary | ICD-10-CM | POA: Diagnosis not present

## 2018-03-21 DIAGNOSIS — M546 Pain in thoracic spine: Secondary | ICD-10-CM | POA: Diagnosis not present

## 2018-03-24 DIAGNOSIS — M546 Pain in thoracic spine: Secondary | ICD-10-CM | POA: Diagnosis not present

## 2018-03-28 ENCOUNTER — Telehealth: Payer: Self-pay | Admitting: *Deleted

## 2018-03-28 DIAGNOSIS — M546 Pain in thoracic spine: Secondary | ICD-10-CM | POA: Diagnosis not present

## 2018-03-28 NOTE — Telephone Encounter (Signed)
Called patient x3 on 03/27/18 and 03/28/18 and left him a vmail to make him aware that his 03/30/18 Lab/MD appts had to be R/S due to MD being out of the office on 03/30/18. Patient appts. were R/S to 04/07/18. And appt reminder letter will also be mailed out today.

## 2018-03-29 ENCOUNTER — Ambulatory Visit: Payer: 59 | Attending: Nurse Practitioner | Admitting: Nurse Practitioner

## 2018-03-29 ENCOUNTER — Encounter: Payer: Self-pay | Admitting: Nurse Practitioner

## 2018-03-29 VITALS — BP 141/99 | HR 74 | Temp 97.9°F | Resp 16 | Ht 73.0 in | Wt 267.0 lb

## 2018-03-29 DIAGNOSIS — Z79899 Other long term (current) drug therapy: Secondary | ICD-10-CM | POA: Diagnosis not present

## 2018-03-29 DIAGNOSIS — Z79891 Long term (current) use of opiate analgesic: Secondary | ICD-10-CM | POA: Diagnosis not present

## 2018-03-29 DIAGNOSIS — G8929 Other chronic pain: Secondary | ICD-10-CM

## 2018-03-29 DIAGNOSIS — G894 Chronic pain syndrome: Secondary | ICD-10-CM

## 2018-03-29 DIAGNOSIS — M5414 Radiculopathy, thoracic region: Secondary | ICD-10-CM | POA: Insufficient documentation

## 2018-03-29 DIAGNOSIS — G8912 Acute post-thoracotomy pain: Secondary | ICD-10-CM | POA: Diagnosis not present

## 2018-03-29 DIAGNOSIS — Z86718 Personal history of other venous thrombosis and embolism: Secondary | ICD-10-CM | POA: Insufficient documentation

## 2018-03-29 DIAGNOSIS — Z87891 Personal history of nicotine dependence: Secondary | ICD-10-CM | POA: Diagnosis not present

## 2018-03-29 DIAGNOSIS — Z9889 Other specified postprocedural states: Secondary | ICD-10-CM | POA: Insufficient documentation

## 2018-03-29 DIAGNOSIS — G588 Other specified mononeuropathies: Secondary | ICD-10-CM

## 2018-03-29 DIAGNOSIS — Z7901 Long term (current) use of anticoagulants: Secondary | ICD-10-CM | POA: Diagnosis not present

## 2018-03-29 DIAGNOSIS — M546 Pain in thoracic spine: Secondary | ICD-10-CM

## 2018-03-29 DIAGNOSIS — Z833 Family history of diabetes mellitus: Secondary | ICD-10-CM | POA: Insufficient documentation

## 2018-03-29 DIAGNOSIS — E559 Vitamin D deficiency, unspecified: Secondary | ICD-10-CM | POA: Insufficient documentation

## 2018-03-29 MED ORDER — OXYCODONE HCL 5 MG PO TABS: 5.0000 mg | ORAL_TABLET | Freq: Three times a day (TID) | ORAL | 0 refills | Status: DC | PRN

## 2018-03-29 NOTE — Progress Notes (Signed)
Patient's Name: Justin [name]  MRN: [id number]  Referring Provider: Valerie Roys, DO  DOB: 06-20-1965  PCP: Valerie Roys, DO  DOS: 03/29/2018  Note by: Vevelyn Francois NP  Service setting: Ambulatory outpatient  Specialty: Interventional Pain Management  Location: [facility] (AMB) Pain Management Facility    Patient type: Established    Primary Reason(s) for Visit: Encounter for prescription drug management. (Level of risk: moderate)  CC: Abdominal Pain (intercostal right )  HPI  Justin [name] is a 52 y.o. year old, male patient, who comes today for a medication management evaluation. He has Abnormal finding on EKG; Atypical chest pain; Left subclavian vein thrombosis ([place]); Pain of left scapula; Dyspnea; Lupus anticoagulant positive; DVT (deep venous thrombosis) ([place]); Thoracic outlet syndrome; TMJ tenderness, right; Left-sided low back pain with left-sided sciatica; Radiculopathy of thoracic region; Chronic thoracic back pain (Primary Area of Pain) (Right); Thoracic radiculitis (Secondary Area of Pain); Chronic pain syndrome; Long term current use of opiate analgesic; Long term current use of anticoagulant (Lovenox); Pharmacologic therapy; Disorder of skeletal system; Rib pain on right side (Fourth Area of Pain); Right upper quadrant abdominal pain (Tertiary Area of Pain); Elevated C-reactive protein (CRP); Elevated sed rate; Vitamin D deficiency; 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 His primarily concern today is the Abdominal Pain (intercostal right )  Pain Assessment: Location: Right Rib cage Radiating: denies Onset: More than a month ago Duration: Chronic pain Quality: Aching, Constant, Squeezing, Pressure, Discomfort Severity: 6 /10 (subjective, self-reported pain score)  Note: Reported level is compatible with  observation.                          Effect on ADL: unable to lay on his right side.  has to sleep in a 45 degree angle.  hurts more if he is laying flat.  Timing: Constant Modifying factors: procedure gave pain relief x 1 week,  water therapy, medications BP: (!) 141/99  HR: 74  Justin [name] was last scheduled for an appointment on 02/02/2018 for medication management. During today's appointment we reviewed Justin [name] chronic pain status, as well as his outpatient medication regimen. His pain is stable.  He denies any new concerns today.  The patient  reports that he does not use drugs. His body mass index is 35.23 kg/m.  Further details on both, my assessment(s), as well as the proposed treatment plan, please see below.  Controlled Substance Pharmacotherapy Assessment REMS (Risk Evaluation and Mitigation Strategy)  Analgesic:OxycodoneIR5 mg1 tablets3times daily(oxycodone65m/day) MME/day:22.59mday PaJanett BillowRN  03/29/2018  1:08 PM  Sign at close encounter Nursing Pain Medication Assessment:  Safety precautions to be maintained throughout the outpatient stay will include: orient to surroundings, keep bed in low position, maintain call bell within reach at all times, provide assistance with transfer out of bed and ambulation.  Medication Inspection Compliance: Pill count conducted under aseptic conditions, in front of the patient. Neither the pills nor the bottle was removed from the patient's sight at any time. Once count was completed pills were immediately returned to the patient in their original bottle.  Medication: Oxycodone IR Pill/Patch Count: 22 of 90 pills remain Pill/Patch Appearance: Markings consistent with prescribed medication Bottle Appearance: Standard pharmacy container. Clearly labeled. Filled Date: 10 / 01 / 2019 Last Medication intake:  Today   Pharmacokinetics: Liberation and  absorption (onset of action): WNL Distribution (time to peak  effect): WNL Metabolism and excretion (duration of action): WNL         Pharmacodynamics: Desired effects: Analgesia: Mr. Merced reports >50% benefit. Functional ability: Patient reports that medication allows him to accomplish basic ADLs Clinically meaningful improvement in function (CMIF): Sustained CMIF goals met Perceived effectiveness: Described as relatively effective, allowing for increase in activities of daily living (ADL) Undesirable effects: Side-effects or Adverse reactions: None reported Monitoring: [place] PMP: Online review of the past 58-monthperiod conducted. Compliant with practice rules and regulations Last UDS on record: Summary  Date Value Ref Range Status  02/02/2018 FINAL  Final    Comment:    ==================================================================== TOXASSURE SELECT 13 (MW) ==================================================================== Test                             Result       Flag       Units Drug Present and Declared for Prescription Verification   Oxycodone                      1340         EXPECTED   ng/mg creat   Oxymorphone                    127          EXPECTED   ng/mg creat   Noroxycodone                   870          EXPECTED   ng/mg creat   Noroxymorphone                 37           EXPECTED   ng/mg creat    Sources of oxycodone are scheduled prescription medications.    Oxymorphone, noroxycodone, and noroxymorphone are expected    metabolites of oxycodone. Oxymorphone is also available as a    scheduled prescription medication. ==================================================================== Test                      Result    Flag   Units      Ref Range   Creatinine              255              mg/dL      >=20 ==================================================================== Declared Medications:  The flagging and interpretation on this report are based on the  following declared medications.  Unexpected results may  arise from  inaccuracies in the declared medications.  **Note: The testing scope of this panel includes these medications:  Oxycodone  **Note: The testing scope of this panel does not include following  reported medications:  Acetaminophen  Atorvastatin  Calcium carbonate (Calcium Carb/Vitamin D)  Enoxaparin  Gabapentin  Ibuprofen  Magnesium  Vitamin D (Calcium Carb/Vitamin D) ==================================================================== For clinical consultation, please call [phone] ====================================================================    UDS interpretation: Compliant          Medication Assessment Form: Reviewed. Patient indicates being compliant with therapy Treatment compliance: Compliant Risk Assessment Profile: Aberrant behavior: See prior evaluations. None observed or detected today Comorbid factors increasing risk of overdose: See prior notes. No additional risks detected today Opioid risk tool (ORT) (Total Score): 3 Personal History of Substance Abuse (SUD-Substance use disorder):  Alcohol: Positive Male or Male  Illegal Drugs:    Rx Drugs:    ORT Risk Level calculation: Low Risk Risk of substance use disorder (SUD): Low Opioid Risk Tool - 03/29/18 1437      Personal History of Substance Abuse   Alcohol  Positive Male or Male      Age   Age between 22-45 years   No      History of Preadolescent Sexual Abuse   History of Preadolescent Sexual Abuse  Negative or Male      Psychological Disease   Psychological Disease  Negative    Depression  Negative      Total Score   Opioid Risk Tool Scoring  3    Opioid Risk Interpretation  Low Risk      ORT Scoring interpretation table:  Score <3 = Low Risk for SUD  Score between 4-7 = Moderate Risk for SUD  Score >8 = High Risk for Opioid Abuse   Risk Mitigation Strategies:  Patient Counseling: Covered Patient-Prescriber Agreement (PPA): Present and active  Notification to other  healthcare providers: Done  Pharmacologic Plan: No change in therapy, at this time.             Laboratory Chemistry  Inflammation Markers (CRP: Acute Phase) (ESR: Chronic Phase) Lab Results  Component Value Date   CRP 11.4 (H) 07/28/2017   ESRSEDRATE 40 (H) 07/28/2017   LATICACIDVEN 0.9 07/20/2016                         Rheumatology Markers Lab Results  Component Value Date   ANA Negative 07/28/2017                        Renal Function Markers Lab Results  Component Value Date   BUN 11 12/26/2017   CREATININE 1.29 (H) 12/26/2017   BCR 9 09/08/2017   GFRAA >60 12/26/2017   GFRNONAA >60 12/26/2017                             Hepatic Function Markers Lab Results  Component Value Date   AST 32 12/26/2017   ALT 40 12/26/2017   ALBUMIN 3.8 12/26/2017   ALKPHOS 75 12/26/2017   LIPASE 90 03/02/2014                        Electrolytes Lab Results  Component Value Date   NA 136 12/26/2017   K 3.9 12/26/2017   CL 105 12/26/2017   CALCIUM 9.1 12/26/2017   MG 2.0 07/28/2017                        Neuropathy Markers Lab Results  Component Value Date   VITAMINB12 391 07/28/2017                        CNS Tests No results found for: COLORCSF, APPEARCSF, RBCCOUNTCSF, WBCCSF, POLYSCSF, LYMPHSCSF, EOSCSF, PROTEINCSF, GLUCCSF, JCVIRUS, CSFOLI, IGGCSF                      Bone Pathology Markers Lab Results  Component Value Date   25OHVITD1 10 (L) 07/28/2017   25OHVITD2 3.2 07/28/2017   25OHVITD3 6.8 07/28/2017  Coagulation Parameters Lab Results  Component Value Date   INR 1.03 07/20/2016   LABPROT 13.5 07/20/2016   APTT 45 (H) 07/20/2016   PLT 273 12/26/2017                        Cardiovascular Markers Lab Results  Component Value Date   BNP 18.0 07/20/2016   TROPONINI <0.03 12/27/2016   HGB 13.6 12/26/2017   HCT 41.0 12/26/2017                         CA Markers No results found for: CEA, CA125, LABCA2                       Note: Lab results reviewed.  Recent Diagnostic Imaging Results  DG C-Arm 1-60 Min-No Report Fluoroscopy was utilized by the requesting physician.  No radiographic  interpretation.   Complexity Note: Imaging results reviewed. Results shared with Justin [name], using Layman's terms.                         Meds   Current Outpatient Medications:  .  acetaminophen (TYLENOL) 500 MG tablet, Take 1,000 mg by mouth every 6 (six) hours as needed., Disp: , Rfl:  .  atorvastatin (LIPITOR) 10 MG tablet, Take 1 tablet (10 mg total) by mouth daily., Disp: 90 tablet, Rfl: 1 .  enoxaparin (LOVENOX) 150 MG/ML injection, INJECT 0.74 MLS (110 MG TOTAL) INTO THE SKIN 2 (TWO) TIMES DAILY, Disp: 60 Syringe, Rfl: 0 .  gabapentin (NEURONTIN) 300 MG capsule, Take 1 capsule (300 mg total) by mouth 3 (three) times daily. Take 1 tab TID, can take an extra tab at bedtime (Patient taking differently: Take 300 mg by mouth 3 (three) times daily. Take 1 tab TID, can take an extra tab at bedtime), Disp: 120 capsule, Rfl: 1 .  ibuprofen (ADVIL,MOTRIN) 800 MG tablet, Take 800 mg by mouth every 8 (eight) hours as needed., Disp: , Rfl:  .  Calcium Carbonate-Vit D-Min (GNP CALCIUM 1200) 1200-1000 MG-UNIT CHEW, Chew 1,200 mg by mouth daily with breakfast. Take in combination with vitamin D and magnesium., Disp: 30 tablet, Rfl: 5 .  [START ON 06/05/2018] oxyCODONE (OXY IR/ROXICODONE) 5 MG immediate release tablet, Take 1 tablet (5 mg total) by mouth every 8 (eight) hours as needed for severe pain., Disp: 90 tablet, Rfl: 0 .  [START ON 05/06/2018] oxyCODONE (OXY IR/ROXICODONE) 5 MG immediate release tablet, Take 1 tablet (5 mg total) by mouth every 8 (eight) hours as needed for severe pain., Disp: 90 tablet, Rfl: 0 .  [START ON 04/06/2018] oxyCODONE (OXY IR/ROXICODONE) 5 MG immediate release tablet, Take 1 tablet (5 mg total) by mouth every 8 (eight) hours as needed for severe pain., Disp: 90 tablet, Rfl: 0  ROS  Constitutional:  Denies any fever or chills Gastrointestinal: No reported hemesis, hematochezia, vomiting, or acute GI distress Musculoskeletal: Denies any acute onset joint swelling, redness, loss of ROM, or weakness Neurological: No reported episodes of acute onset apraxia, aphasia, dysarthria, agnosia, amnesia, paralysis, loss of coordination, or loss of consciousness  Allergies  Justin [name] has No Known Allergies.  [place]  Drug: Justin [name]  reports that he does not use drugs. Alcohol:  reports that he does not drink alcohol. Tobacco:  reports that he has quit smoking. He smoked 0.50 packs per day. He has never used smokeless tobacco. Medical:  has a past medical history of Arm vein blood clot, DVT (deep venous thrombosis) ([place]) (2012; January 2016), GERD (gastroesophageal reflux disease), Rib pain on right side (08/04/2016), and Vascular thoracic outlet syndrome. Surgical: Mr. Mayorquin  has a past surgical history that includes Knee cartilage surgery; Carotid angiogram; Scalenotomy w/o resection cervical rib (2012); and Right upper extremity thrombectomy (2012). Family: family history includes Breast cancer (age of onset: 30) in his father; Cancer in his maternal grandmother; Diabetes in his father; Heart attack in his father; Heart disease in his father; Leukemia in his maternal grandfather; Prostate cancer in his maternal uncle.  Constitutional Exam  General appearance: Well nourished, well developed, and well hydrated. In no apparent acute distress Vitals:   03/29/18 1309  BP: (!) 141/99  Pulse: 74  Resp: 16  Temp: 97.9 F (36.6 C)  TempSrc: Oral  SpO2: 100%  Weight: 267 lb (121.1 kg)  Height: _0  (1.854 m)  Psych/Mental status: Alert, oriented x 3 (person, place, & time)       Eyes: PERLA Respiratory: No evidence of acute respiratory distress  Thoracic Spine Area Exam  Skin & Axial Inspection: No masses, redness, or swelling Alignment: Symmetrical Functional ROM: Unrestricted  ROM Stability: No instability detected Muscle Tone/Strength: Functionally intact. No obvious neuro-muscular anomalies detected. Sensory (Neurological): Unimpaired Muscle strength & Tone: Tender   Gait & Posture Assessment  Ambulation: Unassisted Gait: Relatively normal for age and body habitus Posture: WNL   Assessment  Primary Diagnosis & Pertinent Problem List: The primary encounter diagnosis was Chronic thoracic back pain (Primary Area of Pain) (Right). 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  Status Diagnosis  Stable Stable Stable 1. Chronic thoracic back pain (Primary Area of Pain) (Right)   2. Thoracic radiculitis (Secondary Area of Pain)   3. Intercostal neuralgia (T6, T7) (Right)   4. 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 Chronic pain syndrome     Problems updated and reviewed during this visit: No problems updated. Plan of Care  Pharmacotherapy (Medications Ordered): Meds ordered this encounter  Medications  . oxyCODONE (OXY IR/ROXICODONE) 5 MG immediate release tablet    Sig: Take 1 tablet (5 mg total) by mouth every 8 (eight) hours as needed for severe pain.    Dispense:  90 tablet    Refill:  0    Do not place this medication, or any other prescription from our practice, on "Automatic Refill". Patient may have prescription filled one day early if pharmacy is closed on scheduled refill date.    Order Specific Question:   Supervising Provider    Answer:   Milinda Pointer [phone]  . oxyCODONE (OXY IR/ROXICODONE) 5 MG immediate release tablet    Sig: Take 1 tablet (5 mg total) by mouth every 8 (eight) hours as needed for severe pain.    Dispense:  90 tablet    Refill:  0    Do not place this medication, or any other prescription from our practice, on  "Automatic Refill". Patient may have prescription filled one day early if pharmacy is closed on scheduled refill date.    Order Specific Question:   Supervising Provider    Answer:   Milinda Pointer [phone]  . oxyCODONE (OXY IR/ROXICODONE) 5 MG immediate release tablet    Sig: Take 1 tablet (5 mg total) by mouth every 8 (eight)  hours as needed for severe pain.    Dispense:  90 tablet    Refill:  0    Do not place this medication on "Automatic Refill". Patient may have prescription filled one day early if pharmacy is closed on scheduled refill date.    Order Specific Question:   Supervising Provider    Answer:   Milinda Pointer [phone]   New Prescriptions   OXYCODONE (OXY IR/ROXICODONE) 5 MG IMMEDIATE RELEASE TABLET    Take 1 tablet (5 mg total) by mouth every 8 (eight) hours as needed for severe pain.   Medications administered today: Justin [name] had no medications administered during this visit. Lab-work, procedure(s), and/or referral(s): No orders of the defined types were placed in this encounter.  Imaging and/or referral(s): None  Interventional therapies: Planned, scheduled, and/or pending:   Not at this time.   Considering:   Diagnostic right-sided T6, T7 intercostal nerve blocks#2 Possible right-sided intercostal RFA Diagnostic right-sided thoracicSNRB Diagnosticright-sided thoracicfacetmedial nerveblock Possible right sided thoracic facet medial branch RFA   Palliative PRN treatment(s):   None at this time    Provider-requested follow-up: Return in about 3 months (around 06/29/2018) for MedMgmt.  Future Appointments  Date Time Provider [place]  04/07/2018  1:00 PM [facility] None  04/07/2018  1:30 PM Lequita Asal, MD [facility] None  06/29/2018 10:30 AM Vevelyn Francois, NP [facility] None  09/14/2018  1:00 PM Volney American, PA-C [facility]   Primary Care Physician: Valerie Roys, DO Location: [facility]  Outpatient Pain Management Facility Note by: Vevelyn Francois NP Date: 03/29/2018; Time: 4:04 PM  Pain Score Disclaimer: We use the NRS-11 scale. This is a self-reported, subjective measurement of pain severity with only modest accuracy. It is used primarily to identify changes within a particular patient. It must be understood that outpatient pain scales are significantly less accurate that those used for research, where they can be applied under ideal controlled circumstances with minimal exposure to variables. In reality, the score is likely to be a combination of pain intensity and pain affect, where pain affect describes the degree of emotional arousal or changes in action readiness caused by the sensory experience of pain. Factors such as social and work situation, setting, emotional state, anxiety levels, expectation, and prior pain experience may influence pain perception and show large inter-individual differences that may also be affected by time variables.  Patient instructions provided during this appointment: Patient Instructions   ____________________________________________________________________________________________  Medication Rules  Applies to: All patients receiving prescriptions (written or electronic).  Pharmacy of record: Pharmacy where electronic prescriptions will be sent. If written prescriptions are taken to a different pharmacy, please inform the nursing staff. The pharmacy listed in the electronic medical record should be the one where you would like electronic prescriptions to be sent.  Prescription refills: Only during scheduled appointments. Applies to both, written and electronic prescriptions.  NOTE: The following applies primarily to controlled substances (Opioid* Pain Medications).   Patient's responsibilities: 1. Pain Pills: Bring all pain pills to every appointment (except for procedure appointments). 2. Pill Bottles: Bring pills in original pharmacy  bottle. Always bring newest bottle. Bring bottle, even if empty. 3. Medication refills: You are responsible for knowing and keeping track of what medications you need refilled. The day before your appointment, write a list of all prescriptions that need to be refilled. Bring that list to your appointment and give it to the admitting nurse. Prescriptions will be written only during  appointments. If you forget a medication, it will not be "Called in", "Faxed", or "electronically sent". You will need to get another appointment to get these prescribed. 4. Prescription Accuracy: You are responsible for carefully inspecting your prescriptions before leaving our office. Have the discharge nurse carefully go over each prescription with you, before taking them home. Make sure that your name is accurately spelled, that your address is correct. Check the name and dose of your medication to make sure it is accurate. Check the number of pills, and the written instructions to make sure they are clear and accurate. Make sure that you are given enough medication to last until your next medication refill appointment. 5. Taking Medication: Take medication as prescribed. Never take more pills than instructed. Never take medication more frequently than prescribed. Taking less pills or less frequently is permitted and encouraged, when it comes to controlled substances (written prescriptions).  6. Inform other Doctors: Always inform, all of your healthcare providers, of all the medications you take. 7. Pain Medication from other Providers: You are not allowed to accept any additional pain medication from any other Doctor or Healthcare provider. There are two exceptions to this rule. (see below) In the event that you require additional pain medication, you are responsible for notifying us, as stated below. 8. Medication Agreement: You are responsible for carefully reading and following our Medication Agreement. This must be signed  before receiving any prescriptions from our practice. Safely store a copy of your signed Agreement. Violations to the Agreement will result in no further prescriptions. (Additional copies of our Medication Agreement are available upon request.) 9. Laws, Rules, & Regulations: All patients are expected to follow all Federal and [employer], Rules, [employer]. Ignorance of the Laws does not constitute a valid excuse. The use of any illegal substances is prohibited. 10. Adopted CDC guidelines & recommendations: Target dosing levels will be at or below 60 MME/day. Use of benzodiazepines** is not recommended.  Exceptions: There are only two exceptions to the rule of not receiving pain medications from other Healthcare Providers. 1. Exception #1 (Emergencies): In the event of an emergency (i.e.: accident requiring emergency care), you are allowed to receive additional pain medication. However, you are responsible for: As soon as you are able, call our office [phone], at any time of the day or night, and leave a message stating your name, the date and nature of the emergency, and the name and dose of the medication prescribed. In the event that your call is answered by a member of our staff, make sure to document and save the date, time, and the name of the person that took your information.  2. Exception #2 (Planned Surgery): In the event that you are scheduled by another doctor or dentist to have any type of surgery or procedure, you are allowed (for a period no longer than 30 days), to receive additional pain medication, for the acute post-op pain. However, in this case, you are responsible for picking up a copy of our "Post-op Pain Management for Surgeons" handout, and giving it to your surgeon or dentist. This document is available at our office, and does not require an appointment to obtain it. Simply go to our office during business hours (Monday-Thursday from 8:00 AM to 4:00 PM) (Friday 8:00  AM to 12:00 Noon) or if you have a scheduled appointment with [place], prior to your surgery, and ask for it by name. In addition, you will need to provide [place] with  your name, name of your surgeon, type of surgery, and date of procedure or surgery.  *Opioid medications include: morphine, codeine, oxycodone, oxymorphone, hydrocodone, hydromorphone, meperidine, tramadol, tapentadol, buprenorphine, fentanyl, methadone. **Benzodiazepine medications include: diazepam (Valium), alprazolam (Xanax), clonazepam (Klonopine), lorazepam (Ativan), clorazepate (Tranxene), chlordiazepoxide (Librium), estazolam (Prosom), oxazepam (Serax), temazepam (Restoril), triazolam (Halcion) (Last updated: 08/04/2017) ____________________________________________________________________________________________   BMI Assessment: Estimated body mass index is 35.23 kg/m as calculated from the following:   Height as of this encounter: _0  (1.854 m).   Weight as of this encounter: 267 lb (121.1 kg).  BMI interpretation table: BMI level Category Range association with higher incidence of chronic pain  <18 kg/m2 Underweight   18.5-24.9 kg/m2 Ideal body weight   25-29.9 kg/m2 Overweight Increased incidence by 20%  30-34.9 kg/m2 Obese (Class I) Increased incidence by 68%  35-39.9 kg/m2 Severe obesity (Class II) Increased incidence by 136%  >40 kg/m2 Extreme obesity (Class III) Increased incidence by 254%   Patient's current BMI Ideal Body weight  Body mass index is 35.23 kg/m. Ideal body weight: 79.9 kg (176 lb 2.4 oz) Adjusted ideal body weight: 96.4 kg (212 lb 7.8 oz)   BMI Readings from Last 4 Encounters:  03/29/18 35.23 kg/m  02/02/18 35.76 kg/m  01/25/18 37.32 kg/m  12/29/17 36.26 kg/m   Wt Readings from Last 4 Encounters:  03/29/18 267 lb (121.1 kg)  02/02/18 260 lb (117.9 kg)  01/25/18 267 lb 9.6 oz (121.4 kg)  12/29/17 260 lb (117.9 kg)   Oxycodone 5 mg x 3 months to begin filling on 04/06/18 escribed to  pharmacy

## 2018-03-29 NOTE — Patient Instructions (Addendum)
____________________________________________________________________________________________  Medication Rules  Applies to: All patients receiving prescriptions (written or electronic).  Pharmacy of record: Pharmacy where electronic prescriptions will be sent. If written prescriptions are taken to a different pharmacy, please inform the nursing staff. The pharmacy listed in the electronic medical record should be the one where you would like electronic prescriptions to be sent.  Prescription refills: Only during scheduled appointments. Applies to both, written and electronic prescriptions.  NOTE: The following applies primarily to controlled substances (Opioid* Pain Medications).   Patient's responsibilities: 1. Pain Pills: Bring all pain pills to every appointment (except for procedure appointments). 2. Pill Bottles: Bring pills in original pharmacy bottle. Always bring newest bottle. Bring bottle, even if empty. 3. Medication refills: You are responsible for knowing and keeping track of what medications you need refilled. The day before your appointment, write a list of all prescriptions that need to be refilled. Bring that list to your appointment and give it to the admitting nurse. Prescriptions will be written only during appointments. If you forget a medication, it will not be "Called in", "Faxed", or "electronically sent". You will need to get another appointment to get these prescribed. 4. Prescription Accuracy: You are responsible for carefully inspecting your prescriptions before leaving our office. Have the discharge nurse carefully go over each prescription with you, before taking them home. Make sure that your name is accurately spelled, that your address is correct. Check the name and dose of your medication to make sure it is accurate. Check the number of pills, and the written instructions to make sure they are clear and accurate. Make sure that you are given enough medication to last  until your next medication refill appointment. 5. Taking Medication: Take medication as prescribed. Never take more pills than instructed. Never take medication more frequently than prescribed. Taking less pills or less frequently is permitted and encouraged, when it comes to controlled substances (written prescriptions).  6. Inform other Doctors: Always inform, all of your healthcare providers, of all the medications you take. 7. Pain Medication from other Providers: You are not allowed to accept any additional pain medication from any other Doctor or Healthcare provider. There are two exceptions to this rule. (see below) In the event that you require additional pain medication, you are responsible for notifying us, as stated below. 8. Medication Agreement: You are responsible for carefully reading and following our Medication Agreement. This must be signed before receiving any prescriptions from our practice. Safely store a copy of your signed Agreement. Violations to the Agreement will result in no further prescriptions. (Additional copies of our Medication Agreement are available upon request.) 9. Laws, Rules, & Regulations: All patients are expected to follow all Federal and State Laws, Statutes, Rules, & Regulations. Ignorance of the Laws does not constitute a valid excuse. The use of any illegal substances is prohibited. 10. Adopted CDC guidelines & recommendations: Target dosing levels will be at or below 60 MME/day. Use of benzodiazepines** is not recommended.  Exceptions: There are only two exceptions to the rule of not receiving pain medications from other Healthcare Providers. 1. Exception #1 (Emergencies): In the event of an emergency (i.e.: accident requiring emergency care), you are allowed to receive additional pain medication. However, you are responsible for: As soon as you are able, call our office [phone], at any time of the day or night, and leave a message stating your name, the  date and nature of the emergency, and the name and dose of the medication   prescribed. In the event that your call is answered by a member of our staff, make sure to document and save the date, time, and the name of the person that took your information.  2. Exception #2 (Planned Surgery): In the event that you are scheduled by another doctor or dentist to have any type of surgery or procedure, you are allowed (for a period no longer than 30 days), to receive additional pain medication, for the acute post-op pain. However, in this case, you are responsible for picking up a copy of our "Post-op Pain Management for Surgeons" handout, and giving it to your surgeon or dentist. This document is available at our office, and does not require an appointment to obtain it. Simply go to our office during business hours (Monday-Thursday from 8:00 AM to 4:00 PM) (Friday 8:00 AM to 12:00 Noon) or if you have a scheduled appointment with [place], prior to your surgery, and ask for it by name. In addition, you will need to provide [place] with your name, name of your surgeon, type of surgery, and date of procedure or surgery.  *Opioid medications include: morphine, codeine, oxycodone, oxymorphone, hydrocodone, hydromorphone, meperidine, tramadol, tapentadol, buprenorphine, fentanyl, methadone. **Benzodiazepine medications include: diazepam (Valium), alprazolam (Xanax), clonazepam (Klonopine), lorazepam (Ativan), clorazepate (Tranxene), chlordiazepoxide (Librium), estazolam (Prosom), oxazepam (Serax), temazepam (Restoril), triazolam (Halcion) (Last updated: 08/04/2017) ____________________________________________________________________________________________   BMI Assessment: Estimated body mass index is 35.23 kg/m as calculated from the following:   Height as of this encounter: 6\' 1"  (1.854 m).   Weight as of this encounter: 267 lb (121.1 kg).  BMI interpretation table: BMI level Category Range association with higher  incidence of chronic pain  <18 kg/m2 Underweight   18.5-24.9 kg/m2 Ideal body weight   25-29.9 kg/m2 Overweight Increased incidence by 20%  30-34.9 kg/m2 Obese (Class I) Increased incidence by 68%  35-39.9 kg/m2 Severe obesity (Class II) Increased incidence by 136%  >40 kg/m2 Extreme obesity (Class III) Increased incidence by 254%   Patient's current BMI Ideal Body weight  Body mass index is 35.23 kg/m. Ideal body weight: 79.9 kg (176 lb 2.4 oz) Adjusted ideal body weight: 96.4 kg (212 lb 7.8 oz)   BMI Readings from Last 4 Encounters:  03/29/18 35.23 kg/m  02/02/18 35.76 kg/m  01/25/18 37.32 kg/m  12/29/17 36.26 kg/m   Wt Readings from Last 4 Encounters:  03/29/18 267 lb (121.1 kg)  02/02/18 260 lb (117.9 kg)  01/25/18 267 lb 9.6 oz (121.4 kg)  12/29/17 260 lb (117.9 kg)   Oxycodone 5 mg x 3 months to begin filling on 04/06/18 escribed to pharmacy

## 2018-03-29 NOTE — Progress Notes (Signed)
Nursing Pain Medication Assessment:  Safety precautions to be maintained throughout the outpatient stay will include: orient to surroundings, keep bed in low position, maintain call bell within reach at all times, provide assistance with transfer out of bed and ambulation.  Medication Inspection Compliance: Pill count conducted under aseptic conditions, in front of the patient. Neither the pills nor the bottle was removed from the patient's sight at any time. Once count was completed pills were immediately returned to the patient in their original bottle.  Medication: Oxycodone IR Pill/Patch Count: 22 of 90 pills remain Pill/Patch Appearance: Markings consistent with prescribed medication Bottle Appearance: Standard pharmacy container. Clearly labeled. Filled Date: 10 / 01 / 2019 Last Medication intake:  Today

## 2018-03-30 ENCOUNTER — Inpatient Hospital Stay: Payer: 59

## 2018-03-30 ENCOUNTER — Inpatient Hospital Stay: Payer: 59 | Admitting: Hematology and Oncology

## 2018-04-04 DIAGNOSIS — M546 Pain in thoracic spine: Secondary | ICD-10-CM | POA: Diagnosis not present

## 2018-04-06 DIAGNOSIS — M546 Pain in thoracic spine: Secondary | ICD-10-CM | POA: Diagnosis not present

## 2018-04-07 ENCOUNTER — Encounter: Payer: Self-pay | Admitting: Hematology and Oncology

## 2018-04-07 ENCOUNTER — Inpatient Hospital Stay: Payer: 59 | Attending: Hematology and Oncology

## 2018-04-07 ENCOUNTER — Other Ambulatory Visit: Payer: Self-pay

## 2018-04-07 ENCOUNTER — Inpatient Hospital Stay (HOSPITAL_BASED_OUTPATIENT_CLINIC_OR_DEPARTMENT_OTHER): Payer: 59 | Admitting: Hematology and Oncology

## 2018-04-07 VITALS — BP 115/76 | HR 67 | Temp 96.2°F | Resp 18 | Wt 265.2 lb

## 2018-04-07 DIAGNOSIS — Z7901 Long term (current) use of anticoagulants: Secondary | ICD-10-CM | POA: Insufficient documentation

## 2018-04-07 DIAGNOSIS — Z79899 Other long term (current) drug therapy: Secondary | ICD-10-CM | POA: Insufficient documentation

## 2018-04-07 DIAGNOSIS — I82511 Chronic embolism and thrombosis of right femoral vein: Secondary | ICD-10-CM

## 2018-04-07 DIAGNOSIS — Z87891 Personal history of nicotine dependence: Secondary | ICD-10-CM

## 2018-04-07 DIAGNOSIS — R760 Raised antibody titer: Secondary | ICD-10-CM

## 2018-04-07 DIAGNOSIS — Z86718 Personal history of other venous thrombosis and embolism: Secondary | ICD-10-CM

## 2018-04-07 DIAGNOSIS — I82B12 Acute embolism and thrombosis of left subclavian vein: Secondary | ICD-10-CM

## 2018-04-07 LAB — COMPREHENSIVE METABOLIC PANEL
ALT: 35 U/L (ref 0–44)
AST: 25 U/L (ref 15–41)
Albumin: 4.1 g/dL (ref 3.5–5.0)
Alkaline Phosphatase: 72 U/L (ref 38–126)
Anion gap: 8 (ref 5–15)
BUN: 12 mg/dL (ref 6–20)
CO2: 26 mmol/L (ref 22–32)
Calcium: 9.5 mg/dL (ref 8.9–10.3)
Chloride: 103 mmol/L (ref 98–111)
Creatinine, Ser: 1.36 mg/dL — ABNORMAL HIGH (ref 0.61–1.24)
GFR calc Af Amer: >60 mL/min (ref >60)
GFR calc non Af Amer: 58 mL/min — ABNORMAL LOW (ref >60)
Glucose, Bld: 121 mg/dL — ABNORMAL HIGH (ref 70–99)
Potassium: 4.2 mmol/L (ref 3.5–5.1)
Sodium: 137 mmol/L (ref 135–145)
Total Bilirubin: 0.3 mg/dL (ref 0.3–1.2)
Total Protein: 7.6 g/dL (ref 6.5–8.1)

## 2018-04-07 LAB — CBC WITH DIFFERENTIAL/PLATELET
Abs Immature Granulocytes: 0.05 10*3/uL (ref 0.00–0.07)
Basophils Absolute: 0 10*3/uL (ref 0.0–0.1)
Basophils Relative: 1 %
Eosinophils Absolute: 0.2 10*3/uL (ref 0.0–0.5)
Eosinophils Relative: 2 %
HCT: 45.8 % (ref 39.0–52.0)
Hemoglobin: 14.6 g/dL (ref 13.0–17.0)
Immature Granulocytes: 1 %
Lymphocytes Relative: 22 %
Lymphs Abs: 1.6 10*3/uL (ref 0.7–4.0)
MCH: 29.7 pg (ref 26.0–34.0)
MCHC: 31.9 g/dL (ref 30.0–36.0)
MCV: 93.3 fL (ref 80.0–100.0)
Monocytes Absolute: 0.8 10*3/uL (ref 0.1–1.0)
Monocytes Relative: 11 %
Neutro Abs: 4.7 10*3/uL (ref 1.7–7.7)
Neutrophils Relative %: 63 %
Platelets: 272 10*3/uL (ref 150–400)
RBC: 4.91 MIL/uL (ref 4.22–5.81)
RDW: 12.6 % (ref 11.5–15.5)
WBC: 7.4 10*3/uL (ref 4.0–10.5)
nRBC: 0 % (ref 0.0–0.2)

## 2018-04-07 NOTE — Progress Notes (Signed)
[facility] day:  04/07/2018   Chief Complaint: Justin [name] is a 52 y.o. male with recurrent thrombosis who is seen for 3 month assessment on Lovenox.    HPI:  The patient was last seen in the hematology clinic on 12/26/2017.  At that time, he was doing well. He was scheduled for radiofrequency nerve ablation procedure later that week. He had undergone a total of 4 costal nerve blocks as treatment for his postoperative intercostal neuralgia.  He continued on Lovenox injections twice daily.  Patient denied shortness of breath, and was no longer using supplemental oxygen at home.  Exam was grossly unremarkable.  CBC was normal.  BUN was 11 and creatinine 1.29 (CrCl 88.4 mL/min).  During the interim, patient underwent radiofrequency nerve ablation since he was last seen. He notes some improvement with the procedure "for about 2 months". Patient notes that he had to have his "rib shaved down" during the procedure. He developed a post-operative infection 1 month after surgery.   Patient continues on enoxaparin injections. Patient denies bleeding; no hematochezia, melena, or gross hematuria. He denies any shortness of breath or hemoptysis. He has not experienced any claudication pain or swelling in his upper or lower extremities. Patient complains of pain in his LEFT hip that causes him to limp at times. Patient denies that he has experienced any B symptoms. He denies any interval infections.   Patient advises that he maintains an adequate appetite. He is eating well. Weight today is 265 lb 4 oz (120.3 kg), which compared to his last visit to the clinic, represents a stable weight.   Patient complains of pain rated 6/10 in the clinic today.   Past Medical History:  Diagnosis Date  . Arm vein blood clot    right  . DVT (deep venous thrombosis) ([place]) 2012; January 2016   History of right arm; now right lower shotty  . GERD (gastroesophageal reflux  disease)   . Rib pain on right side 08/04/2016   RT rib pain that radiates around to the back. Level 8 of 10.  Constant. Started 1 mo ago, 1 week after DVT.  Marland Kitchen Vascular thoracic outlet syndrome    Right     Past Surgical History:  Procedure Laterality Date  . CAROTID ANGIOGRAM    . KNEE CARTILAGE SURGERY     Performed for cracked patella and torn meniscus  . Right upper extremity thrombectomy  2012  . SCALENOTOMY W/O RESECTION CERVICAL RIB  2012   4 right-sided thoracic outlet syndrome    Family History  Problem Relation Age of Onset  . Heart disease Father   . Breast cancer Father 33  . Diabetes Father   . Heart attack Father   . Cancer Maternal Grandmother   . Leukemia Maternal Grandfather   . Prostate cancer Maternal Uncle     Social History:  reports that he has quit smoking. He smoked 0.50 packs per day. He has never used smokeless tobacco. He reports that he does not drink alcohol or use drugs.  He previously smoked 1/2 pack a day for 10 years.  He stopped smoking in his 108s.  He lives in [place].  He is a Glass blower/designer.  He denies any exposure to radiation or toxins.  He has a son.  The patient's wife is [name].  He is alone today.  Allergies: No Known Allergies  Current Medications: Current Outpatient Medications  Medication Sig Dispense Refill  .  acetaminophen (TYLENOL) 500 MG tablet Take 1,000 mg by mouth every 6 (six) hours as needed.    Marland Kitchen atorvastatin (LIPITOR) 10 MG tablet Take 1 tablet (10 mg total) by mouth daily. 90 tablet 1  . Calcium Carbonate-Vit D-Min (GNP CALCIUM 1200) 1200-1000 MG-UNIT CHEW Chew 1,200 mg by mouth daily with breakfast. Take in combination with vitamin D and magnesium. 30 tablet 5  . enoxaparin (LOVENOX) 150 MG/ML injection INJECT 0.74 MLS (110 MG TOTAL) INTO THE SKIN 2 (TWO) TIMES DAILY 60 Syringe 0  . gabapentin (NEURONTIN) 300 MG capsule Take 1 capsule (300 mg total) by mouth 3 (three) times daily. Take 1 tab TID, can take an extra tab at  bedtime (Patient taking differently: Take 300 mg by mouth 3 (three) times daily. Take 1 tab TID, can take an extra tab at bedtime) 120 capsule 1  . ibuprofen (ADVIL,MOTRIN) 800 MG tablet Take 800 mg by mouth every 8 (eight) hours as needed.    Derrill Memo ON 06/05/2018] oxyCODONE (OXY IR/ROXICODONE) 5 MG immediate release tablet Take 1 tablet (5 mg total) by mouth every 8 (eight) hours as needed for severe pain. 90 tablet 0  . oxyCODONE (OXY IR/ROXICODONE) 5 MG immediate release tablet Take 1 tablet (5 mg total) by mouth every 8 (eight) hours as needed for severe pain. 90 tablet 0  . [START ON 05/06/2018] oxyCODONE (OXY IR/ROXICODONE) 5 MG immediate release tablet Take 1 tablet (5 mg total) by mouth every 8 (eight) hours as needed for severe pain. (Patient not taking: Reported on 04/07/2018) 90 tablet 0   No current facility-administered medications for this visit.     Review of Systems  Constitutional: Negative for diaphoresis, fever, malaise/fatigue and weight loss (stable).       "I am feeling better"  HENT: Negative.   Eyes: Negative.   Respiratory: Negative for cough, hemoptysis, sputum production and shortness of breath.   Cardiovascular: Negative for chest pain, palpitations, orthopnea, leg swelling and PND.  Gastrointestinal: Negative for abdominal pain, blood in stool, constipation, diarrhea, melena, nausea and vomiting.  Genitourinary: Negative for dysuria, frequency, hematuria and urgency.  Musculoskeletal: Positive for joint pain (LEFT hip). Negative for back pain, falls and myalgias.       Recent nerve ablation to RIGHT chest wall  Skin: Negative for itching and rash.  Neurological: Negative for dizziness, tremors, weakness and headaches.  Endo/Heme/Allergies: Bruises/bleeds easily (on enoxaparin injections).  Psychiatric/Behavioral: Negative for depression, memory loss and suicidal ideas. The patient is not nervous/anxious and does not have insomnia.   All other systems reviewed and  are negative.  Performance status (ECOG): 1 - Symptomatic but completely ambulatory  Vital Signs BP 115/76 (BP Location: Left Arm, Patient Position: Sitting)   Pulse 67   Temp (!) 96.2 F (35.7 C) (Tympanic)   Resp 18   Wt 265 lb 4 oz (120.3 kg)   BMI 35.00 kg/m   Physical Exam  Constitutional: He is oriented to person, place, and time and well-developed, well-nourished, and in no distress. No distress.  HENT:  Head: Normocephalic and atraumatic.  Mouth/Throat: Oropharynx is clear and moist and mucous membranes are normal. No oropharyngeal exudate.  Eyes: Pupils are equal, round, and reactive to light. Conjunctivae and EOM are normal. No scleral icterus.  Neck: Normal range of motion. Neck supple. No JVD present.  Cardiovascular: Normal rate, regular rhythm, normal heart sounds and intact distal pulses. Exam reveals no gallop and no friction rub.  No murmur heard. Pulmonary/Chest: Effort normal and  breath sounds normal. No respiratory distress. He has no wheezes. He has no rales.  Abdominal: Soft. Bowel sounds are normal. He exhibits no distension and no mass. There is no abdominal tenderness. There is no rebound and no guarding.  Musculoskeletal: Normal range of motion. He exhibits no edema or tenderness.  Lymphadenopathy:    He has no cervical adenopathy.  Neurological: He is alert and oriented to person, place, and time. Gait normal.  Skin: Skin is warm and dry. No rash noted. He is not diaphoretic. No erythema.  Psychiatric: Mood, affect and judgment normal.  Nursing note and vitals reviewed.   Orders Only on 04/07/2018  Component Date Value Ref Range Status  . Sodium 04/07/2018 137  135 - 145 mmol/L Final  . Potassium 04/07/2018 4.2  3.5 - 5.1 mmol/L Final  . Chloride 04/07/2018 103  98 - 111 mmol/L Final  . CO2 04/07/2018 26  22 - 32 mmol/L Final  . Glucose, Bld 04/07/2018 121* 70 - 99 mg/dL Final  . BUN 04/07/2018 12  6 - 20 mg/dL Final  . Creatinine, Ser 04/07/2018  1.36* 0.61 - 1.24 mg/dL Final  . Calcium 04/07/2018 9.5  8.9 - 10.3 mg/dL Final  . Total Protein 04/07/2018 7.6  6.5 - 8.1 g/dL Final  . Albumin 04/07/2018 4.1  3.5 - 5.0 g/dL Final  . AST 04/07/2018 25  15 - 41 U/L Final  . ALT 04/07/2018 35  0 - 44 U/L Final  . Alkaline Phosphatase 04/07/2018 72  38 - 126 U/L Final  . Total Bilirubin 04/07/2018 0.3  0.3 - 1.2 mg/dL Final  . GFR calc non Af Amer 04/07/2018 58* >60 mL/min Final  . GFR calc Af Amer 04/07/2018 >60  >60 mL/min Final   Comment: (NOTE) The eGFR has been calculated using the CKD EPI equation. This calculation has not been validated in all clinical situations. eGFR's persistently <60 mL/min signify possible Chronic Kidney Disease.   Georgiann Hahn gap 04/07/2018 8  5 - 15 Final   Performed at [facility], [address] [place], [place] [address]  . WBC 04/07/2018 7.4  4.0 - 10.5 K/uL Final  . RBC 04/07/2018 4.91  4.22 - 5.81 MIL/uL Final  . Hemoglobin 04/07/2018 14.6  13.0 - 17.0 g/dL Final  . HCT 04/07/2018 45.8  39.0 - 52.0 % Final  . MCV 04/07/2018 93.3  80.0 - 100.0 fL Final  . MCH 04/07/2018 29.7  26.0 - 34.0 pg Final  . MCHC 04/07/2018 31.9  30.0 - 36.0 g/dL Final  . RDW 04/07/2018 12.6  11.5 - 15.5 % Final  . Platelets 04/07/2018 272  150 - 400 K/uL Final  . nRBC 04/07/2018 0.0  0.0 - 0.2 % Final   Performed at [facility], [address] [place], [place] [address]  . Neutrophils Relative % 04/07/2018 PENDING  % Incomplete  . Neutro Abs 04/07/2018 PENDING  1.7 - 7.7 K/uL Incomplete  . Band Neutrophils 04/07/2018 PENDING  % Incomplete  . Lymphocytes Relative 04/07/2018 PENDING  % Incomplete  . Lymphs Abs 04/07/2018 PENDING  0.7 - 4.0 K/uL Incomplete  . Monocytes Relative 04/07/2018 PENDING  % Incomplete  . Monocytes Absolute 04/07/2018 PENDING  0.1 - 1.0 K/uL Incomplete  . Eosinophils Relative 04/07/2018 PENDING  % Incomplete  . Eosinophils Absolute 04/07/2018 PENDING  0.0 - 0.5 K/uL Incomplete  .  Basophils Relative 04/07/2018 PENDING  % Incomplete  . Basophils Absolute 04/07/2018 PENDING  0.0 - 0.1 K/uL Incomplete  .  WBC Morphology 04/07/2018 PENDING   Incomplete  . RBC Morphology 04/07/2018 PENDING   Incomplete  . Smear Review 04/07/2018 PENDING   Incomplete  . Other 04/07/2018 PENDING  % Incomplete  . nRBC 04/07/2018 PENDING  0 /100 WBC Incomplete  . Metamyelocytes Relative 04/07/2018 PENDING  % Incomplete  . Myelocytes 04/07/2018 PENDING  % Incomplete  . Promyelocytes Relative 04/07/2018 PENDING  % Incomplete  . Blasts 04/07/2018 PENDING  % Incomplete    Assessment:  Justin [name] is a 52 y.o. male with recurrent thrombosis x 4.  He developed a right upper extremity clot in 2010. He underwent thrombolysis.  He was on Coumadin x 6 months.  He developed a recurrent clot in the right upper extremity in 2011.  He underwent thoracic outlet decompression.  He was on Coumadin for 6-9 months.  He developed a right lower extremity DVT in 2016.  He was started on Xarelto.  He developed a left upper extremity DVT on 06/07/2016 while on Xarelto.  Duplex revealed a near occlusive DVT in the lateral aspect of the left subclavian vein and nonocclusive thrombus extending into the adjacent axillary vein.  He was started on Lovenox.  Hypercoagulable work-up on 06/29/2016 revealed the following normal studies:  CBC with diff, Factor V Leiden, prothrombin gene mutation, protein C antigen and activity, protein S antigen and activity, ATIII antigen and activity, anticardiolipin antibodies and beta-2 glycoprotein antibodies.  Lupus anticoagulant panel was positive on Xarelto and Lovenox.  Chest CT angiogram on 07/02/2016 revealed no evidence of pulmonary embolism.  There were small pulmonary nodules, largest 3 mm, of unclear significance.  Bone scan on 08/04/2016 revealed a focus of moderately increased uptake at approximately the T9 right paravertebral region. Chest CT on 07/20/2016 revealed a large  right sided osteophyte at the T9-T10 level which was felt to explain the increased right paravertebral uptake uptake.  There  was also mildly increased uptake in the body of L2. Review of the lumbar spine on the abdominal CT scan of the same date revealed no definite radiographic abnormality in the body of L2 to explain the mildly increased uptake.  There was no abnormal uptake within the ribs or elsewhere within the skeleton.  Thoracic spine MRI on 09/14/2016 revealed mild degenerative disc disease in the thoracic spine with non-compressive disc bulges at T5-6 and below.  There was no disc herniation or compressive stenosis of the canal or foramina.  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 On the right at T9-10, there was fairly bulky osteophytes emanating from the right costovertebral articulation. There was no evidence of neural compression secondary to this or the other bony degenerative changes.  There was ordinary mild facet osteoarthritis without edematous change or significant hypertrophy.  He underwent right lateral mini-thoracotomy for resection of a bony rib lesion on 01/14/2017.  Notes indicate he underwent thoracic discectomy with decompression by anterior approach.  Pathology revealed an "exophytic lesion".  He has had right sided rib pain post-operatively.  Bone density on 12/06/2017 revealed a normal T score of -0.6 in the right femoral neck.  Symptomatically, patient is doing well. Pain has improved in his RIGHT ribs following radiofrequency nerve ablation procedure. He experienced a post-procedural infection; resolved. No B symptoms. No shortness of breath or chest pain. Eating well; weight stable.  He is on enoxaparin injections twice daily.  Exam is grossly unremarkable.  BUN 12 and creatinine 1.36 mg/dL (CrCl 86.4 mL/min).  Plan: 1. Labs today:  CBC with diff, CMP. 2. Recurrent thrombosis  Continues on  enoxaparin injections twice daily.  Patient again wanting to discuss bridging back to oral anticoagulation.    Developed clot on rivaroxaban.    Likelihood of failing apixaban is high.  Reviewed monitoring associated with warfarin therapy, which patient is unwilling to consider.   Elects to remain on enoxaparin as previously prescribed.  Discussed need for repeat lupus anticoagulant testing.  Patient will need to be off of anticoagulation for this testing.  We have been trying to coordinate this since 10/2017 with procedural department.  We have discussed several times with Dr. Adalberto Cole office regarding the need for this testing during his washout period for their procedures.  Orders have been entered into [facility], and we have communicated with the pain clinic staff, however sample for testing has not been sent.   Written prescription sent with patient following last RTC visit for the lupus anticoagulant testing, however sample for testing was not collected and sent. 3. Intercostal neuralgia  Patient has undergone multiple intercostal nerve blocks and a recent radiofrequency nerve ablation.  Pain has improved somewhat.  Pain currently being managed by Dr. Dossie Arbour (pain management).  Continue to follow-up with pain clinic and surgery as previously scheduled. 4. RTC in 3 months for MD assessment and labs (CBC with differential, CMP).   Honor Loh, NP 04/07/18, 2:35 PM  I saw and evaluated the patient, participating in the key portions of the service and reviewing pertinent diagnostic studies and records.  I reviewed the nurse practitioner's note and agree with the findings and the plan.  The assessment and plan were discussed with the patient.  Multiple questions were asked by the patient and answered.   Nolon Stalls, MD 04/07/18, 2:35 PM

## 2018-04-07 NOTE — Progress Notes (Signed)
Patient states he continues to have pain in his right ribcage.  Had follow up at [facility] October 7. He is doing PT. Otherwise, offers no complaints.

## 2018-04-11 DIAGNOSIS — M546 Pain in thoracic spine: Secondary | ICD-10-CM | POA: Diagnosis not present

## 2018-04-25 DIAGNOSIS — M546 Pain in thoracic spine: Secondary | ICD-10-CM | POA: Diagnosis not present

## 2018-04-28 DIAGNOSIS — M546 Pain in thoracic spine: Secondary | ICD-10-CM | POA: Diagnosis not present

## 2018-05-09 DIAGNOSIS — M546 Pain in thoracic spine: Secondary | ICD-10-CM | POA: Diagnosis not present

## 2018-05-11 DIAGNOSIS — M546 Pain in thoracic spine: Secondary | ICD-10-CM | POA: Diagnosis not present

## 2018-06-22 DIAGNOSIS — M546 Pain in thoracic spine: Secondary | ICD-10-CM | POA: Diagnosis not present

## 2018-06-26 DIAGNOSIS — M546 Pain in thoracic spine: Secondary | ICD-10-CM | POA: Diagnosis not present

## 2018-06-29 ENCOUNTER — Other Ambulatory Visit: Payer: Self-pay

## 2018-06-29 ENCOUNTER — Encounter: Payer: Self-pay | Admitting: Nurse Practitioner

## 2018-06-29 ENCOUNTER — Encounter: Payer: Self-pay | Admitting: Emergency Medicine

## 2018-06-29 ENCOUNTER — Emergency Department
Admission: EM | Admit: 2018-06-29 | Discharge: 2018-06-29 | Disposition: A | Discharge status: Home / Self Care | Payer: 59 | Attending: Emergency Medicine | Admitting: Emergency Medicine

## 2018-06-29 ENCOUNTER — Emergency Department: Payer: 59

## 2018-06-29 ENCOUNTER — Ambulatory Visit: Payer: 59 | Admitting: Nurse Practitioner

## 2018-06-29 VITALS — BP 130/74 | HR 77 | Temp 98.0°F | Ht 71.0 in | Wt 248.0 lb

## 2018-06-29 DIAGNOSIS — G8929 Other chronic pain: Secondary | ICD-10-CM | POA: Insufficient documentation

## 2018-06-29 DIAGNOSIS — M5414 Radiculopathy, thoracic region: Secondary | ICD-10-CM | POA: Insufficient documentation

## 2018-06-29 DIAGNOSIS — R700 Elevated erythrocyte sedimentation rate: Secondary | ICD-10-CM

## 2018-06-29 DIAGNOSIS — I2510 Atherosclerotic heart disease of native coronary artery without angina pectoris: Secondary | ICD-10-CM | POA: Diagnosis not present

## 2018-06-29 DIAGNOSIS — M7989 Other specified soft tissue disorders: Secondary | ICD-10-CM | POA: Diagnosis not present

## 2018-06-29 DIAGNOSIS — Z79891 Long term (current) use of opiate analgesic: Secondary | ICD-10-CM | POA: Insufficient documentation

## 2018-06-29 DIAGNOSIS — R7982 Elevated C-reactive protein (CRP): Secondary | ICD-10-CM | POA: Diagnosis not present

## 2018-06-29 DIAGNOSIS — G894 Chronic pain syndrome: Secondary | ICD-10-CM

## 2018-06-29 DIAGNOSIS — E559 Vitamin D deficiency, unspecified: Secondary | ICD-10-CM | POA: Insufficient documentation

## 2018-06-29 DIAGNOSIS — M1711 Unilateral primary osteoarthritis, right knee: Secondary | ICD-10-CM | POA: Diagnosis not present

## 2018-06-29 DIAGNOSIS — Z79899 Other long term (current) drug therapy: Secondary | ICD-10-CM | POA: Insufficient documentation

## 2018-06-29 DIAGNOSIS — M79604 Pain in right leg: Secondary | ICD-10-CM | POA: Diagnosis not present

## 2018-06-29 DIAGNOSIS — Z87891 Personal history of nicotine dependence: Secondary | ICD-10-CM | POA: Diagnosis not present

## 2018-06-29 DIAGNOSIS — M546 Pain in thoracic spine: Secondary | ICD-10-CM | POA: Insufficient documentation

## 2018-06-29 HISTORY — DX: Atherosclerotic heart disease of native coronary artery without angina pectoris: I25.10

## 2018-06-29 MED ORDER — OXYCODONE HCL 5 MG PO TABS: 5.0000 mg | ORAL_TABLET | Freq: Three times a day (TID) | ORAL | 0 refills | Status: DC | PRN

## 2018-06-29 MED ORDER — OXYCODONE HCL 5 MG PO TABS
5.0000 mg | ORAL_TABLET | Freq: Three times a day (TID) | ORAL | 0 refills | Status: DC | PRN
Start: 2018-08-04 — End: 2018-09-26

## 2018-06-29 MED ORDER — NAPROXEN 500 MG PO TABS: 500.0000 mg | ORAL_TABLET | Freq: Two times a day (BID) | ORAL | 0 refills | Status: DC

## 2018-06-29 NOTE — ED Notes (Signed)
AAOx3.  Skin warm and dry.  NAD 

## 2018-06-29 NOTE — Discharge Instructions (Addendum)
Continue your medications including the Lovenox injections.  Use a knee brace or Ace wraps and take anti-inflammatory medicine such as Aleve for the next week.  If symptoms persist, you may need to follow-up with an orthopedist for further evaluation of the knee.

## 2018-06-29 NOTE — Progress Notes (Signed)
Patient's Name: Justin [name]  MRN: [id number]  Referring Provider: Valerie Roys, DO  DOB: Oct 07, 1965  PCP: Valerie Roys, DO  DOS: 06/29/2018  Note by: Vevelyn Francois NP  Service setting: Ambulatory outpatient  Specialty: Interventional Pain Management  Location: [facility] (AMB) Pain Management Facility    Patient type: Established    Primary Reason(s) for Visit: Encounter for prescription drug management. (Level of risk: moderate)  CC: Chest Pain  HPI  Justin [name] is a 53 y.o. year old, male patient, who comes today for a medication management evaluation. He has Abnormal finding on EKG; Atypical chest pain; Left subclavian vein thrombosis ([place]); Pain of left scapula; Dyspnea; Lupus anticoagulant positive; DVT (deep venous thrombosis) ([place]); Thoracic outlet syndrome; TMJ tenderness, right; Left-sided low back pain with left-sided sciatica; Radiculopathy of thoracic region; Chronic thoracic back pain (Primary Area of Pain) (Right); Thoracic radiculitis (Secondary Area of Pain); Chronic pain syndrome; Long term current use of opiate analgesic; Long term current use of anticoagulant (Lovenox); Pharmacologic therapy; Disorder of skeletal system; Rib pain on right side (Fourth Area of Pain); Right upper quadrant abdominal pain (Tertiary Area of Pain); Elevated C-reactive protein (CRP); Elevated sed rate; Vitamin D deficiency; 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 His primarily concern today is the Chest Pain  Pain Assessment: Location: Right Rib cage Radiating: Denies Onset: More than a month ago Duration: Chronic pain Quality: Grimacing, Aching, Pressure Severity: 7 /10 (subjective, self-reported pain score)  Note: Reported level is compatible with observation. Clinically the patient looks like a 2/10 A 2/10 is viewed as "Mild  to Moderate" and described as noticeable and distracting. Impossible to hide from other people. More frequent flare-ups. Still possible to adapt and function close to normal. It can be very annoying and may have occasional stronger flare-ups. With discipline, patients may get used to it and adapt. Discrepancy may suggest symptom exaggeration. When using our objective Pain Scale, levels between 6 and 10/10 are said to belong in an emergency room, as it progressively worsens from a 6/10, described as severely limiting, requiring emergency care not usually available at an outpatient pain management facility. At a 6/10 level, communication becomes difficult and requires great effort. Assistance to reach the emergency department may be required. Facial flushing and profuse sweating along with potentially dangerous increases in heart rate and blood pressure will be evident. Effect on ADL: limits my daily activities Timing: Constant Modifying factors: medication BP: 130/74  HR: 77  [name] was last scheduled for an appointment on 03/29/2018 for medication management. During today's appointment we reviewed Justin [name] chronic pain status, as well as his outpatient medication regimen. He has stated PT to help with is pain management. He denies any new pain related concerns today. He did put his Vitamin D on hold for a few weeks. He was have some stomach issues.   The patient  reports no history of drug use. His body mass index is 34.59 kg/m.  Further details on both, my assessment(s), as well as the proposed treatment plan, please see below.  Controlled Substance Pharmacotherapy Assessment REMS (Risk Evaluation and Mitigation Strategy)  Analgesic:OxycodoneIR5 mg1 tablets3times daily(oxycodone29m/day) MME/day:22.576mday BrChauncey FischerRN  06/29/2018  8:32 AM  Sign when Signing Visit Nursing Pain Medication Assessment:  Safety precautions to be maintained throughout the outpatient stay  will include: orient to surroundings, keep bed  in low position, maintain call bell within reach at all times, provide assistance with transfer out of bed and ambulation.  Medication Inspection Compliance: Pill count conducted under aseptic conditions, in front of the patient. Neither the pills nor the bottle was removed from the patient's sight at any time. Once count was completed pills were immediately returned to the patient in their original bottle.  Medication: Oxycodone IR Pill/Patch Count: 15 of 90 pills remain Pill/Patch Appearance: Markings consistent with prescribed medication Bottle Appearance: Standard pharmacy container. Clearly labeled. Filled Date: 25 / 31 / 2019 Last Medication intake:  Today   Pharmacokinetics: Liberation and absorption (onset of action): WNL Distribution (time to peak effect): WNL Metabolism and excretion (duration of action): WNL         Pharmacodynamics: Desired effects: Analgesia: Justin [name] reports >50% benefit. Functional ability: Patient reports that medication allows him to accomplish basic ADLs Clinically meaningful improvement in function (CMIF): Sustained CMIF goals met Perceived effectiveness: Described as relatively effective, allowing for increase in activities of daily living (ADL) Undesirable effects: Side-effects or Adverse reactions: None reported Monitoring: [place] PMP: Online review of the past 20-monthperiod conducted. Compliant with practice rules and regulations Last UDS on record: Summary  Date Value Ref Range Status  02/02/2018 FINAL  Final    Comment:    ==================================================================== TOXASSURE SELECT 13 (MW) ==================================================================== Test                             Result       Flag       Units Drug Present and Declared for Prescription Verification   Oxycodone                      1340         EXPECTED   ng/mg creat   Oxymorphone                     127          EXPECTED   ng/mg creat   Noroxycodone                   870          EXPECTED   ng/mg creat   Noroxymorphone                 37           EXPECTED   ng/mg creat    Sources of oxycodone are scheduled prescription medications.    Oxymorphone, noroxycodone, and noroxymorphone are expected    metabolites of oxycodone. Oxymorphone is also available as a    scheduled prescription medication. ==================================================================== Test                      Result    Flag   Units      Ref Range   Creatinine              255              mg/dL      >=20 ==================================================================== Declared Medications:  The flagging and interpretation on this report are based on the  following declared medications.  Unexpected results may arise from  inaccuracies in the declared medications.  **Note: The testing scope of this panel includes these medications:  Oxycodone  **Note: The testing scope of this panel does not include following  reported medications:  Acetaminophen  Atorvastatin  Calcium carbonate (Calcium Carb/Vitamin D)  Enoxaparin  Gabapentin  Ibuprofen  Magnesium  Vitamin D (Calcium Carb/Vitamin D) ==================================================================== For clinical consultation, please call [phone]. ====================================================================    UDS interpretation: Compliant          Medication Assessment Form: Reviewed. Patient indicates being compliant with therapy Treatment compliance: Compliant Risk Assessment Profile: Aberrant behavior: See prior evaluations. None observed or detected today Comorbid factors increasing risk of overdose: See prior notes. No additional risks detected today Opioid risk tool (ORT) (Total Score): 0 Personal History of Substance Abuse (SUD-Substance use disorder):  Alcohol: Negative  Illegal Drugs: Negative  Rx Drugs:  Negative  ORT Risk Level calculation: Low Risk Risk of substance use disorder (SUD): Low Opioid Risk Tool - 06/29/18 0843      Family History of Substance Abuse   Alcohol  Negative    Illegal Drugs  Negative    Rx Drugs  Negative      Personal History of Substance Abuse   Alcohol  Negative    Illegal Drugs  Negative    Rx Drugs  Negative      Age   Age between 55-45 years   No      History of Preadolescent Sexual Abuse   History of Preadolescent Sexual Abuse  Negative or Male      Psychological Disease   Psychological Disease  Negative    Depression  Negative      Total Score   Opioid Risk Tool Scoring  0    Opioid Risk Interpretation  Low Risk      ORT Scoring interpretation table:  Score <3 = Low Risk for SUD  Score between 4-7 = Moderate Risk for SUD  Score >8 = High Risk for Opioid Abuse   Risk Mitigation Strategies:  Patient Counseling: Covered Patient-Prescriber Agreement (PPA): Present and active  Notification to other healthcare providers: Done  Pharmacologic Plan: No change in therapy, at this time.             Laboratory Chemistry  Inflammation Markers (CRP: Acute Phase) (ESR: Chronic Phase) Lab Results  Component Value Date   CRP 11.4 (H) 07/28/2017   ESRSEDRATE 40 (H) 07/28/2017   LATICACIDVEN 0.9 07/20/2016                         Rheumatology Markers Lab Results  Component Value Date   ANA Negative 07/28/2017                        Renal Function Markers Lab Results  Component Value Date   BUN 12 04/07/2018   CREATININE 1.36 (H) 04/07/2018   BCR 9 09/08/2017   GFRAA >60 04/07/2018   GFRNONAA 58 (L) 04/07/2018                             Hepatic Function Markers Lab Results  Component Value Date   AST 25 04/07/2018   ALT 35 04/07/2018   ALBUMIN 4.1 04/07/2018   ALKPHOS 72 04/07/2018   LIPASE 90 03/02/2014                        Electrolytes Lab Results  Component Value Date   NA 137 04/07/2018   K 4.2 04/07/2018   CL 103  04/07/2018   CALCIUM 9.5 04/07/2018   MG 2.0  07/28/2017                        Neuropathy Markers Lab Results  Component Value Date   VITAMINB12 391 07/28/2017                        CNS Tests No results found for: COLORCSF, APPEARCSF, RBCCOUNTCSF, WBCCSF, POLYSCSF, LYMPHSCSF, EOSCSF, PROTEINCSF, GLUCCSF, JCVIRUS, CSFOLI, IGGCSF                      Bone Pathology Markers Lab Results  Component Value Date   25OHVITD1 10 (L) 07/28/2017   25OHVITD2 3.2 07/28/2017   25OHVITD3 6.8 07/28/2017                         Coagulation Parameters Lab Results  Component Value Date   INR 1.03 07/20/2016   LABPROT 13.5 07/20/2016   APTT 45 (H) 07/20/2016   PLT 272 04/07/2018                        Cardiovascular Markers Lab Results  Component Value Date   BNP 18.0 07/20/2016   TROPONINI <0.03 12/27/2016   HGB 14.6 04/07/2018   HCT 45.8 04/07/2018                         CA Markers No results found for: CEA, CA125, LABCA2                      Note: Lab results reviewed.  Recent Diagnostic Imaging Results  DG C-Arm 1-60 Min-No Report Fluoroscopy was utilized by the requesting physician.  No radiographic  interpretation.   Complexity Note: Imaging results reviewed. Results shared with Mr. Latin, using Layman's terms.                         Meds   Current Outpatient Medications:  .  acetaminophen (TYLENOL) 500 MG tablet, Take 1,000 mg by mouth every 6 (six) hours as needed., Disp: , Rfl:  .  atorvastatin (LIPITOR) 10 MG tablet, Take 1 tablet (10 mg total) by mouth daily., Disp: 90 tablet, Rfl: 1 .  enoxaparin (LOVENOX) 150 MG/ML injection, INJECT 0.74 MLS (110 MG TOTAL) INTO THE SKIN 2 (TWO) TIMES DAILY, Disp: 60 Syringe, Rfl: 0 .  gabapentin (NEURONTIN) 300 MG capsule, Take 1 capsule (300 mg total) by mouth 3 (three) times daily. Take 1 tab TID, can take an extra tab at bedtime (Patient taking differently: Take 300 mg by mouth 3 (three) times daily. Take 1 tab TID, can take  an extra tab at bedtime), Disp: 120 capsule, Rfl: 1 .  ibuprofen (ADVIL,MOTRIN) 800 MG tablet, Take 800 mg by mouth every 8 (eight) hours as needed., Disp: , Rfl:  .  [START ON 09/03/2018] oxyCODONE (OXY IR/ROXICODONE) 5 MG immediate release tablet, Take 1 tablet (5 mg total) by mouth every 8 (eight) hours as needed for up to 30 days for severe pain., Disp: 90 tablet, Rfl: 0 .  Calcium Carbonate-Vit D-Min (GNP CALCIUM 1200) 1200-1000 MG-UNIT CHEW, Chew 1,200 mg by mouth daily with breakfast. Take in combination with vitamin D and magnesium., Disp: 30 tablet, Rfl: 5 .  [START ON 08/04/2018] oxyCODONE (OXY IR/ROXICODONE) 5 MG immediate release tablet, Take 1 tablet (5 mg total) by mouth every 8 (eight)  hours as needed for up to 30 days for severe pain., Disp: 90 tablet, Rfl: 0 .  [START ON 07/05/2018] oxyCODONE (OXY IR/ROXICODONE) 5 MG immediate release tablet, Take 1 tablet (5 mg total) by mouth every 8 (eight) hours as needed for up to 30 days for severe pain., Disp: 90 tablet, Rfl: 0  ROS  Constitutional: Denies any fever or chills Gastrointestinal: No reported hemesis, hematochezia, vomiting, or acute GI distress Musculoskeletal: Denies any acute onset joint swelling, redness, loss of ROM, or weakness Neurological: No reported episodes of acute onset apraxia, aphasia, dysarthria, agnosia, amnesia, paralysis, loss of coordination, or loss of consciousness  Allergies  Mr. Maret has No Known Allergies.  [place]  Drug: Mr. Lurz  reports no history of drug use. Alcohol:  reports no history of alcohol use. Tobacco:  reports that he has quit smoking. He smoked 0.50 packs per day. He has never used smokeless tobacco. Medical:  has a past medical history of Arm vein blood clot, DVT (deep venous thrombosis) ([place]) (2012; January 2016), GERD (gastroesophageal reflux disease), Rib pain on right side (08/04/2016), and Vascular thoracic outlet syndrome. Surgical: Mr. Fults  has a past surgical history that  includes Knee cartilage surgery; Carotid angiogram; Scalenotomy w/o resection cervical rib (2012); and Right upper extremity thrombectomy (2012). Family: family history includes Breast cancer (age of onset: 54) in his father; Cancer in his maternal grandmother; Diabetes in his father; Heart attack in his father; Heart disease in his father; Leukemia in his maternal grandfather; Prostate cancer in his maternal uncle.  Constitutional Exam  General appearance: Well nourished, well developed, and well hydrated. In no apparent acute distress Vitals:   06/29/18 0832  BP: 130/74  Pulse: 77  Temp: 98 F (36.7 C)  SpO2: 94%  Weight: 248 lb (112.5 kg)  Height: _0  (1.803 m)  Psych/Mental status: Alert, oriented x 3 (person, place, & time)       Eyes: PERLA Respiratory: No evidence of acute respiratory distress  Upper Extremity (UE) Exam    Side: Right upper extremity  Side: Left upper extremity  Skin & Extremity Inspection: Skin color, temperature, and hair growth are WNL. No peripheral edema or cyanosis. No masses, redness, swelling, asymmetry, or associated skin lesions. No contractures.  Skin & Extremity Inspection: Skin color, temperature, and hair growth are WNL. No peripheral edema or cyanosis. No masses, redness, swelling, asymmetry, or associated skin lesions. No contractures.  Functional ROM: Unrestricted ROM          Functional ROM: Unrestricted ROM          Muscle Tone/Strength: Functionally intact. No obvious neuro-muscular anomalies detected.  Muscle Tone/Strength: Functionally intact. No obvious neuro-muscular anomalies detected.  Sensory (Neurological): Unimpaired          Sensory (Neurological): Unimpaired          Palpation: No palpable anomalies              Palpation: No palpable anomalies              Provocative Test(s):  Phalen's test: deferred Tinel's test: deferred Apley's scratch test (touch opposite shoulder):  Action 1 (Across chest): deferred Action 2 (Overhead):  deferred Action 3 (LB reach): deferred   Provocative Test(s):  Phalen's test: deferred Tinel's test: deferred Apley's scratch test (touch opposite shoulder):  Action 1 (Across chest): deferred Action 2 (Overhead): deferred Action 3 (LB reach): deferred    Thoracic Spine Area Exam  Skin & Axial Inspection: Well healed scar from  previous spine surgery detected Alignment: Symmetrical Functional ROM: Unrestricted ROM Stability: No instability detected Muscle Tone/Strength: Increased muscle tone over affected area Sensory (Neurological): Movement associated pain Muscle strength & Tone: Tender  Gait & Posture Assessment  Ambulation: Unassisted Gait: Relatively normal for age and body habitus Posture: WNL   Assessment  Primary Diagnosis & Pertinent Problem List: The primary encounter diagnosis was Chronic thoracic back pain (Primary Area of Pain) (Right). Diagnoses of Thoracic radiculitis (Secondary Area of Pain), Chronic pain syndrome, Long term current use of opiate analgesic, Elevated C-reactive protein (CRP), Elevated sed rate, and Vitamin D deficiency were also pertinent to this visit.  Status Diagnosis  Persistent Persistent Persistent 1. Chronic thoracic back pain (Primary Area of Pain) (Right)   2. Thoracic radiculitis (Secondary Area of Pain)   3. Chronic pain syndrome   4. Long term current use of opiate analgesic   5. Elevated C-reactive protein (CRP)   6. Elevated sed rate   7. Vitamin D deficiency     Problems updated and reviewed during this visit: No problems updated. Plan of Care  Pharmacotherapy (Medications Ordered): Meds ordered this encounter  Medications  . oxyCODONE (OXY IR/ROXICODONE) 5 MG immediate release tablet    Sig: Take 1 tablet (5 mg total) by mouth every 8 (eight) hours as needed for up to 30 days for severe pain.    Dispense:  90 tablet    Refill:  0    Do not place this medication, or any other prescription from our practice, on "Automatic  Refill". Patient may have prescription filled one day early if pharmacy is closed on scheduled refill date.    Order Specific Question:   Supervising Provider    Answer:   Milinda Pointer [phone]  . oxyCODONE (OXY IR/ROXICODONE) 5 MG immediate release tablet    Sig: Take 1 tablet (5 mg total) by mouth every 8 (eight) hours as needed for up to 30 days for severe pain.    Dispense:  90 tablet    Refill:  0    Do not place this medication, or any other prescription from our practice, on "Automatic Refill". Patient may have prescription filled one day early if pharmacy is closed on scheduled refill date.    Order Specific Question:   Supervising Provider    Answer:   Milinda Pointer [phone]  . oxyCODONE (OXY IR/ROXICODONE) 5 MG immediate release tablet    Sig: Take 1 tablet (5 mg total) by mouth every 8 (eight) hours as needed for up to 30 days for severe pain.    Dispense:  90 tablet    Refill:  0    Do not place this medication on "Automatic Refill". Patient may have prescription filled one day early if pharmacy is closed on scheduled refill date.    Order Specific Question:   Supervising Provider    Answer:   Milinda Pointer [[id number]]   New Prescriptions   No medications on file   Medications administered today: [name] had no medications administered during this visit. Lab-work, procedure(s), and/or referral(s): Orders Placed This Encounter  Procedures  . ToxASSURE Select 13 (MW), Urine  . Comp. Metabolic Panel (12)  . Sedimentation rate  . C-reactive protein  . 25-Hydroxyvitamin D Lcms D2+D3   Imaging and/or referral(s): None   Interventional therapies: Planned, scheduled, and/or pending:   Not at this time.   Considering: Diagnostic right-sided T6, T7 intercostal nerve blocks#2 Possible right-sided intercostal RFA Diagnostic right-sided thoracicSNRB Diagnosticright-sided thoracicfacetmedial nerveblock Possible  right sided thoracic  facet medial branch RFA   Palliative PRN treatment(s): None at this time    Provider-requested follow-up: Return in about 3 months (around 09/28/2018) for MedMgmt.  Future Appointments  Date Time Provider Kevin  07/14/2018  2:00 PM [facility] None  07/14/2018  2:30 PM Lequita Asal, MD [facility] None  09/14/2018  1:00 PM Volney American, PA-C [facility]   Primary Care Physician: Valerie Roys, DO Location: [facility] Out[facility] Management [facility] Note by: Vevelyn Francois NP Date: 06/29/2018; Time: 9:10 AM  Pain Score Disclaimer: We use the NRS-11 scale. This is a self-reported, subjective measurement of pain severity with only modest accuracy. It is used primarily to identify changes within a particular patient. It must be understood that outpatient pain scales are significantly less accurate that those used for research, where they can be applied under ideal controlled circumstances with minimal exposure to variables. In reality, the score is likely to be a combination of pain intensity and pain affect, where pain affect describes the degree of emotional arousal or changes in action readiness caused by the sensory experience of pain. Factors such as social and work situation, setting, emotional state, anxiety levels, expectation, and prior pain experience may influence pain perception and show large inter-individual differences that may also be affected by time variables.  Patient instructions provided during this appointment: Patient Instructions   ____________________________________________________________________________________________  Medication Rules  Purpose: To inform patients, and their family members, of our rules and regulations.  Applies to: All patients receiving prescriptions (written or electronic).  Pharmacy of record: Pharmacy where electronic prescriptions will be sent. If written prescriptions are taken to a different  pharmacy, please inform the nursing staff. The pharmacy listed in the electronic medical record should be the one where you would like electronic prescriptions to be sent.  Electronic prescriptions: In compliance with the [place] (STOP) Act of 2017 (Session Lanny Cramp [phone]), effective June 07, 2018, all controlled substances must be electronically prescribed. Calling prescriptions to the pharmacy will cease to exist.  Prescription refills: Only during scheduled appointments. Applies to all prescriptions.  NOTE: The following applies primarily to controlled substances (Opioid* Pain Medications).   Patient's responsibilities: 1. Pain Pills: Bring all pain pills to every appointment (except for procedure appointments). 2. Pill Bottles: Bring pills in original pharmacy bottle. Always bring the newest bottle. Bring bottle, even if empty. 3. Medication refills: You are responsible for knowing and keeping track of what medications you take and those you need refilled. The day before your appointment: write a list of all prescriptions that need to be refilled. The day of the appointment: give the list to the admitting nurse. Prescriptions will be written only during appointments. If you forget a medication: it will not be "Called in", "Faxed", or "electronically sent". You will need to get another appointment to get these prescribed. No early refills. Do not call asking to have your prescription filled early. 4. Prescription Accuracy: You are responsible for carefully inspecting your prescriptions before leaving our office. Have the discharge nurse carefully go over each prescription with you, before taking them home. Make sure that your name is accurately spelled, that your address is correct. Check the name and dose of your medication to make sure it is accurate. Check the number of pills, and the written instructions to make sure they are clear and accurate.  Make sure that you are given enough medication to last until your  next medication refill appointment. 5. Taking Medication: Take medication as prescribed. When it comes to controlled substances, taking less pills or less frequently than prescribed is permitted and encouraged. Never take more pills than instructed. Never take medication more frequently than prescribed.  6. Inform other Doctors: Always inform, all of your healthcare providers, of all the medications you take. 7. Pain Medication from other Providers: You are not allowed to accept any additional pain medication from any other Doctor or Healthcare provider. There are two exceptions to this rule. (see below) In the event that you require additional pain medication, you are responsible for notifying us, as stated below. 8. Medication Agreement: You are responsible for carefully reading and following our Medication Agreement. This must be signed before receiving any prescriptions from our practice. Safely store a copy of your signed Agreement. Violations to the Agreement will result in no further prescriptions. (Additional copies of our Medication Agreement are available upon request.) 9. Laws, Rules, & Regulations: All patients are expected to follow all Federal and [employer], [employer], Rules, [employer]. Ignorance of the Laws does not constitute a valid excuse. The use of any illegal substances is prohibited. 10. Adopted CDC guidelines & recommendations: Target dosing levels will be at or below 60 MME/day. Use of benzodiazepines** is not recommended.  Exceptions: There are only two exceptions to the rule of not receiving pain medications from other Healthcare Providers. 1. Exception #1 (Emergencies): In the event of an emergency (i.e.: accident requiring emergency care), you are allowed to receive additional pain medication. However, you are responsible for: As soon as you are able, call our office [phone], at any time of the day or  night, and leave a message stating your name, the date and nature of the emergency, and the name and dose of the medication prescribed. In the event that your call is answered by a member of our staff, make sure to document and save the date, time, and the name of the person that took your information.  2. Exception #2 (Planned Surgery): In the event that you are scheduled by another doctor or dentist to have any type of surgery or procedure, you are allowed (for a period no longer than 30 days), to receive additional pain medication, for the acute post-op pain. However, in this case, you are responsible for picking up a copy of our "Post-op Pain Management for Surgeons" handout, and giving it to your surgeon or dentist. This document is available at our office, and does not require an appointment to obtain it. Simply go to our office during business hours (Monday-Thursday from 8:00 AM to 4:00 PM) (Friday 8:00 AM to 12:00 Noon) or if you have a scheduled appointment with [place], prior to your surgery, and ask for it by name. In addition, you will need to provide [place] with your name, name of your surgeon, type of surgery, and date of procedure or surgery.  *Opioid medications include: morphine, codeine, oxycodone, oxymorphone, hydrocodone, hydromorphone, meperidine, tramadol, tapentadol, buprenorphine, fentanyl, methadone. **Benzodiazepine medications include: diazepam (Valium), alprazolam (Xanax), clonazepam (Klonopine), lorazepam (Ativan), clorazepate (Tranxene), chlordiazepoxide (Librium), estazolam (Prosom), oxazepam (Serax), temazepam (Restoril), triazolam (Halcion) (Last updated: 08/04/2017) ____________________________________________________________________________________________    BMI Assessment: Estimated body mass index is 34.59 kg/m as calculated from the following:   Height as of this encounter: _0  (1.803 m).   Weight as of this encounter: 248 lb (112.5 kg).  BMI interpretation table: BMI  level Category Range association with higher incidence of chronic pain  <  18 kg/m2 Underweight   18.5-24.9 kg/m2 Ideal body weight   25-29.9 kg/m2 Overweight Increased incidence by 20%  30-34.9 kg/m2 Obese (Class I) Increased incidence by 68%  35-39.9 kg/m2 Severe obesity (Class II) Increased incidence by 136%  >40 kg/m2 Extreme obesity (Class III) Increased incidence by 254%   Patient's current BMI Ideal Body weight  Body mass index is 34.59 kg/m. Ideal body weight: 75.3 kg (166 lb 0.1 oz) Adjusted ideal body weight: 90.2 kg (198 lb 12.9 oz)   BMI Readings from Last 4 Encounters:  06/29/18 34.59 kg/m  04/07/18 35.00 kg/m  03/29/18 35.23 kg/m  02/02/18 35.76 kg/m   Wt Readings from Last 4 Encounters:  06/29/18 248 lb (112.5 kg)  04/07/18 265 lb 4 oz (120.3 kg)  03/29/18 267 lb (121.1 kg)  02/02/18 260 lb (117.9 kg)

## 2018-06-29 NOTE — ED Provider Notes (Signed)
[facility] Emergency Department Provider Note  ____________________________________________  Time seen: Approximately 5:09 PM  I have reviewed the triage vital signs and the nursing notes.   HISTORY  Chief Complaint DVT    HPI Justin [name] is a 53 y.o. male with a history of DVT GERD and CAD who comes the ED complaining of right knee pain in the posterior area, radiating into the calf, gradual onset about 4 or 5 days ago, waxing and waning, no aggravating relieving intensity.  No chest pain shortness of breath fevers or chills.  He is currently on Lovenox injections which he has been compliant with.  Denies any slips trips falls injuries does note that he started some physical therapy 2 days ago.  Also the weather the last 2 to 3 days has been very cold and he has a history of an old meniscus injury in the right knee.      Past Medical History:  Diagnosis Date  . Arm vein blood clot    right  . Coronary artery disease   . DVT (deep venous thrombosis) ([place]) 2012; January 2016   History of right arm; now right lower shotty  . GERD (gastroesophageal reflux disease)   . Rib pain on right side 08/04/2016   RT rib pain that radiates around to the back. Level 8 of 10.  Constant. Started 1 mo ago, 1 week after DVT.  Marland Kitchen Vascular thoracic outlet syndrome    Right      Patient Active Problem List   Diagnosis Date Noted  . Acute postoperative pain 12/29/2017  . Chronic low back pain (Left) with sciatica (Left) 09/01/2017  . Vitamin D deficiency 08/22/2017  . 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 Intercostal neuralgia (T6, T7) (Right) 08/22/2017  . Elevated C-reactive protein (CRP) 08/01/2017  . Elevated sed rate 08/01/2017  . Chronic thoracic back pain (Primary Area of Pain) (Right) 07/28/2017  . Thoracic radiculitis (Secondary Area of Pain) 07/28/2017  . Chronic pain syndrome 07/28/2017  . Long term current use of  opiate analgesic 07/28/2017  . Long term current use of anticoagulant (Lovenox) 07/28/2017  . Pharmacologic therapy 07/28/2017  . Disorder of skeletal system 07/28/2017  . Rib pain on right side (Fourth Area of Pain) 07/28/2017  . Right upper quadrant abdominal pain (Tertiary Area of Pain) 07/28/2017  . TMJ tenderness, right 12/14/2016  . Radiculopathy of thoracic region 10/12/2016  . Lupus anticoagulant positive 07/08/2016  . DVT (deep venous thrombosis) ([place]) 07/08/2016  . Thoracic outlet syndrome 07/08/2016  . Pain of left scapula 07/05/2016  . Dyspnea 07/05/2016  . 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 Abnormal finding on EKG 10/04/2014  . Atypical chest pain 10/04/2014  . Left-sided low back pain with left-sided sciatica 06/05/2014     Past Surgical History:  Procedure Laterality Date  . CAROTID ANGIOGRAM    . KNEE CARTILAGE SURGERY     Performed for cracked patella and torn meniscus  . Right upper extremity thrombectomy  2012  . SCALENOTOMY W/O RESECTION CERVICAL RIB  2012   4 right-sided thoracic outlet syndrome     Prior to Admission medications   Medication Sig Start Date End Date Taking? Authorizing Provider  acetaminophen (TYLENOL) 500 MG tablet Take 1,000 mg by mouth every 6 (six) hours as needed.    [provider]  atorvastatin (LIPITOR) 10 MG tablet Take 1 tablet (10 mg total) by mouth daily. 09/09/17   Volney American,  PA-C  Calcium Carbonate-Vit D-Min (GNP CALCIUM 1200) 1200-1000 MG-UNIT CHEW Chew 1,200 mg by mouth daily with breakfast. Take in combination with vitamin D and magnesium. 08/22/17 04/07/18  Milinda Pointer, MD  enoxaparin (LOVENOX) 150 MG/ML injection INJECT 0.74 MLS (110 MG TOTAL) INTO THE SKIN 2 (TWO) TIMES DAILY 12/09/17 12/09/18  Lequita Asal, MD  gabapentin (NEURONTIN) 300 MG capsule Take 1 capsule (300 mg total) by mouth 3 (three) times daily. Take 1 tab TID, can take an extra tab at bedtime Patient taking  differently: Take 300 mg by mouth 3 (three) times daily. Take 1 tab TID, can take an extra tab at bedtime 06/24/17   Johnson, Megan P, DO  ibuprofen (ADVIL,MOTRIN) 800 MG tablet Take 800 mg by mouth every 8 (eight) hours as needed.    [provider]  naproxen (NAPROSYN) 500 MG tablet Take 1 tablet (500 mg total) by mouth 2 (two) times daily with a meal. 06/29/18   Carrie Mew, MD  oxyCODONE (OXY IR/ROXICODONE) 5 MG immediate release tablet Take 1 tablet (5 mg total) by mouth every 8 (eight) hours as needed for up to 30 days for severe pain. 09/03/18 10/03/18  Vevelyn Francois, NP  oxyCODONE (OXY IR/ROXICODONE) 5 MG immediate release tablet Take 1 tablet (5 mg total) by mouth every 8 (eight) hours as needed for up to 30 days for severe pain. 08/04/18 09/03/18  Vevelyn Francois, NP  oxyCODONE (OXY IR/ROXICODONE) 5 MG immediate release tablet Take 1 tablet (5 mg total) by mouth every 8 (eight) hours as needed for up to 30 days for severe pain. 07/05/18 08/04/18  Vevelyn Francois, NP     Allergies Patient has no known allergies.   Family History  Problem Relation Age of Onset  . Heart disease Father   . Breast cancer Father 40  . Diabetes Father   . Heart attack Father   . Cancer Maternal Grandmother   . Leukemia Maternal Grandfather   . Prostate cancer Maternal Uncle     Social History Social History   Tobacco Use  . Smoking status: Former Smoker    Packs/day: 0.50  . Smokeless tobacco: Never Used  . Tobacco comment: quit smoking in 2010  Substance Use Topics  . Alcohol use: No  . Drug use: No    Review of Systems  Constitutional:   No fever or chills.  ENT:   No sore throat. No rhinorrhea. Cardiovascular:   No chest pain or syncope. Respiratory:   No dyspnea or cough. Gastrointestinal:   Negative for abdominal pain, vomiting and diarrhea.  Musculoskeletal: Right knee pain and swelling All other systems reviewed and are negative except as documented above in ROS and  HPI.  ____________________________________________   PHYSICAL EXAM:  VITAL SIGNS: ED Triage Vitals  Enc Vitals Group     BP 06/29/18 1535 137/78     Pulse Rate 06/29/18 1535 77     Resp 06/29/18 1535 16     Temp 06/29/18 1535 98.2 F (36.8 C)     Temp Source 06/29/18 1535 Oral     SpO2 06/29/18 1535 97 %     Weight 06/29/18 1536 248 lb (112.5 kg)     Height 06/29/18 1536 5\' 11"  (1.803 m)     Head Circumference --      Peak Flow --      Pain Score 06/29/18 1536 8     Pain Loc --      Pain Edu? --  Excl. in [place]? --     Vital signs reviewed, nursing assessments reviewed.   Constitutional:   Alert and oriented. Non-toxic appearance. Eyes:   Conjunctivae are normal. EOMI. ENT      Head:   Normocephalic and atraumatic.         Neck:    Full ROM. Cardiovascular:   RRR. Symmetric bilateral radial and DP pulses.  No murmurs. Cap refill less than 2 seconds. Respiratory:   Normal respiratory effort without tachypnea/retractions. Breath sounds are clear and equal bilaterally. No wheezes/rales/rhonchi. Musculoskeletal:   Normal range of motion in all extremities. No joint effusions.  Slightly increased calf circumference on the right compared to left.  There is tenderness around the right knee, centered around the right MCL.  Joint is stable, no joint effusion.  No inflammatory changes.  Angular stress of the knee provokes pain, consistent with old meniscus injury.  No catch. Neurologic:   Normal speech and language.  Motor grossly intact. No acute focal neurologic deficits are appreciated.  Skin:    Skin is warm, dry and intact. No rash noted.  No petechiae, purpura, or bullae.  ____________________________________________    LABS (pertinent positives/negatives) (all labs ordered are listed, but only abnormal results are displayed) Labs Reviewed - No data to display ____________________________________________   EKG    ____________________________________________     RADIOLOGY  US Venous Img Lower Unilateral Right  Result Date: 06/29/2018 CLINICAL DATA:  Right leg pain and swelling for 3 days EXAM: RIGHT LOWER EXTREMITY VENOUS DOPPLER ULTRASOUND TECHNIQUE: Gray-scale sonography with graded compression, as well as color Doppler and duplex ultrasound were performed to evaluate the lower extremity deep venous systems from the level of the common femoral vein and including the common femoral, femoral, profunda femoral, popliteal and calf veins including the posterior tibial, peroneal and gastrocnemius veins when visible. The superficial great saphenous vein was also interrogated. Spectral Doppler was utilized to evaluate flow at rest and with distal augmentation maneuvers in the common femoral, femoral and popliteal veins. COMPARISON:  None. FINDINGS: Contralateral Common Femoral Vein: Respiratory phasicity is normal and symmetric with the symptomatic side. No evidence of thrombus. Normal compressibility. Common Femoral Vein: No evidence of thrombus. Normal compressibility, respiratory phasicity and response to augmentation. Saphenofemoral Junction: No evidence of thrombus. Normal compressibility and flow on color Doppler imaging. Profunda Femoral Vein: No evidence of thrombus. Normal compressibility and flow on color Doppler imaging. Femoral Vein: No evidence of thrombus. Normal compressibility, respiratory phasicity and response to augmentation. Popliteal Vein: No evidence of thrombus. Normal compressibility, respiratory phasicity and response to augmentation. Calf Veins: No evidence of thrombus. Normal compressibility and flow on color Doppler imaging. Superficial Great Saphenous Vein: No evidence of thrombus. Normal compressibility. Venous Reflux:  None. Other Findings:  None. IMPRESSION: No evidence of deep venous thrombosis. Electronically Signed   By: Inez Catalina M.D.   On: 06/29/2018 16:30     ____________________________________________   PROCEDURES Procedures  ____________________________________________    CLINICAL IMPRESSION / ASSESSMENT AND PLAN / ED COURSE  Pertinent labs & imaging results that were available during my care of the patient were reviewed by me and considered in my medical decision making (see chart for details).      Clinical Course as of Jun 30 1707  Thu Jun 29, 2018  1615 US Venous Img Lower Unilateral Right [PS]  1659 Presents with right knee pain which appears musculoskeletal related to [facility] and possibly aggravation of old meniscus injury.  Possibly related to recent  PT or recent cold weather.  Ultrasound negative, on exam not suspicious for DVT.  Compliant with Lovenox.  Recommend continue medications, Aleve, Ace wrap, follow-up with primary care.   [PS]    Clinical Course User Index [PS] Carrie Mew, MD     ____________________________________________   FINAL CLINICAL IMPRESSION(S) / ED DIAGNOSES    Final diagnoses:  Arthritis of right knee     ED Discharge Orders         Ordered    naproxen (NAPROSYN) 500 MG tablet  2 times daily with meals     06/29/18 1708          Portions of this note were generated with dragon dictation software. Dictation errors may occur despite best attempts at proofreading.   Carrie Mew, MD 06/29/18 [phone]

## 2018-06-29 NOTE — Progress Notes (Signed)
Nursing Pain Medication Assessment:  Safety precautions to be maintained throughout the outpatient stay will include: orient to surroundings, keep bed in low position, maintain call bell within reach at all times, provide assistance with transfer out of bed and ambulation.  Medication Inspection Compliance: Pill count conducted under aseptic conditions, in front of the patient. Neither the pills nor the bottle was removed from the patient's sight at any time. Once count was completed pills were immediately returned to the patient in their original bottle.  Medication: Oxycodone IR Pill/Patch Count: 15 of 90 pills remain Pill/Patch Appearance: Markings consistent with prescribed medication Bottle Appearance: Standard pharmacy container. Clearly labeled. Filled Date: 10 / 31 / 2019 Last Medication intake:  Today

## 2018-06-29 NOTE — ED Triage Notes (Signed)
Pt in via POV, reports pain and swelling to right posterior knee with radiation down into calf.  Pt reports hx of DVT's and expresses concern for the same.  Pt is currently on Lovenox injections.  Vitals WDL, NAD noted at this time.

## 2018-06-29 NOTE — Patient Instructions (Addendum)
____________________________________________________________________________________________  Medication Rules  Purpose: To inform patients, and their family members, of our rules and regulations.  Applies to: All patients receiving prescriptions (written or electronic).  Pharmacy of record: Pharmacy where electronic prescriptions will be sent. If written prescriptions are taken to a different pharmacy, please inform the nursing staff. The pharmacy listed in the electronic medical record should be the one where you would like electronic prescriptions to be sent.  Electronic prescriptions: In compliance with the [place] Strengthen Opioid Misuse Prevention (STOP) Act of 2017 (Session Law 2017-74/H243), effective June 07, 2018, all controlled substances must be electronically prescribed. Calling prescriptions to the pharmacy will cease to exist.  Prescription refills: Only during scheduled appointments. Applies to all prescriptions.  NOTE: The following applies primarily to controlled substances (Opioid* Pain Medications).   Patient's responsibilities: 1. Pain Pills: Bring all pain pills to every appointment (except for procedure appointments). 2. Pill Bottles: Bring pills in original pharmacy bottle. Always bring the newest bottle. Bring bottle, even if empty. 3. Medication refills: You are responsible for knowing and keeping track of what medications you take and those you need refilled. The day before your appointment: write a list of all prescriptions that need to be refilled. The day of the appointment: give the list to the admitting nurse. Prescriptions will be written only during appointments. If you forget a medication: it will not be "Called in", "Faxed", or "electronically sent". You will need to get another appointment to get these prescribed. No early refills. Do not call asking to have your prescription filled early. 4. Prescription Accuracy: You are responsible for  carefully inspecting your prescriptions before leaving our office. Have the discharge nurse carefully go over each prescription with you, before taking them home. Make sure that your name is accurately spelled, that your address is correct. Check the name and dose of your medication to make sure it is accurate. Check the number of pills, and the written instructions to make sure they are clear and accurate. Make sure that you are given enough medication to last until your next medication refill appointment. 5. Taking Medication: Take medication as prescribed. When it comes to controlled substances, taking less pills or less frequently than prescribed is permitted and encouraged. Never take more pills than instructed. Never take medication more frequently than prescribed.  6. Inform other Doctors: Always inform, all of your healthcare providers, of all the medications you take. 7. Pain Medication from other Providers: You are not allowed to accept any additional pain medication from any other Doctor or Healthcare provider. There are two exceptions to this rule. (see below) In the event that you require additional pain medication, you are responsible for notifying us, as stated below. 8. Medication Agreement: You are responsible for carefully reading and following our Medication Agreement. This must be signed before receiving any prescriptions from our practice. Safely store a copy of your signed Agreement. Violations to the Agreement will result in no further prescriptions. (Additional copies of our Medication Agreement are available upon request.) 9. Laws, Rules, & Regulations: All patients are expected to follow all Federal and State Laws, Statutes, Rules, & Regulations. Ignorance of the Laws does not constitute a valid excuse. The use of any illegal substances is prohibited. 10. Adopted CDC guidelines & recommendations: Target dosing levels will be at or below 60 MME/day. Use of benzodiazepines** is not  recommended.  Exceptions: There are only two exceptions to the rule of not receiving pain medications from other Healthcare Providers. 1.   Exception #1 (Emergencies): In the event of an emergency (i.e.: accident requiring emergency care), you are allowed to receive additional pain medication. However, you are responsible for: As soon as you are able, call our office [phone], at any time of the day or night, and leave a message stating your name, the date and nature of the emergency, and the name and dose of the medication prescribed. In the event that your call is answered by a member of our staff, make sure to document and save the date, time, and the name of the person that took your information.  2. Exception #2 (Planned Surgery): In the event that you are scheduled by another doctor or dentist to have any type of surgery or procedure, you are allowed (for a period no longer than 30 days), to receive additional pain medication, for the acute post-op pain. However, in this case, you are responsible for picking up a copy of our "Post-op Pain Management for Surgeons" handout, and giving it to your surgeon or dentist. This document is available at our office, and does not require an appointment to obtain it. Simply go to our office during business hours (Monday-Thursday from 8:00 AM to 4:00 PM) (Friday 8:00 AM to 12:00 Noon) or if you have a scheduled appointment with [place], prior to your surgery, and ask for it by name. In addition, you will need to provide [place] with your name, name of your surgeon, type of surgery, and date of procedure or surgery.  *Opioid medications include: morphine, codeine, oxycodone, oxymorphone, hydrocodone, hydromorphone, meperidine, tramadol, tapentadol, buprenorphine, fentanyl, methadone. **Benzodiazepine medications include: diazepam (Valium), alprazolam (Xanax), clonazepam (Klonopine), lorazepam (Ativan), clorazepate (Tranxene), chlordiazepoxide (Librium), estazolam (Prosom),  oxazepam (Serax), temazepam (Restoril), triazolam (Halcion) (Last updated: 08/04/2017) ____________________________________________________________________________________________    BMI Assessment: Estimated body mass index is 34.59 kg/m as calculated from the following:   Height as of this encounter: 5\' 11"  (1.803 m).   Weight as of this encounter: 248 lb (112.5 kg).  BMI interpretation table: BMI level Category Range association with higher incidence of chronic pain  <18 kg/m2 Underweight   18.5-24.9 kg/m2 Ideal body weight   25-29.9 kg/m2 Overweight Increased incidence by 20%  30-34.9 kg/m2 Obese (Class I) Increased incidence by 68%  35-39.9 kg/m2 Severe obesity (Class II) Increased incidence by 136%  >40 kg/m2 Extreme obesity (Class III) Increased incidence by 254%   Patient's current BMI Ideal Body weight  Body mass index is 34.59 kg/m. Ideal body weight: 75.3 kg (166 lb 0.1 oz) Adjusted ideal body weight: 90.2 kg (198 lb 12.9 oz)   BMI Readings from Last 4 Encounters:  06/29/18 34.59 kg/m  04/07/18 35.00 kg/m  03/29/18 35.23 kg/m  02/02/18 35.76 kg/m   Wt Readings from Last 4 Encounters:  06/29/18 248 lb (112.5 kg)  04/07/18 265 lb 4 oz (120.3 kg)  03/29/18 267 lb (121.1 kg)  02/02/18 260 lb (117.9 kg)

## 2018-07-04 DIAGNOSIS — M546 Pain in thoracic spine: Secondary | ICD-10-CM | POA: Diagnosis not present

## 2018-07-04 LAB — COMP. METABOLIC PANEL (12)
AST: 17 IU/L (ref 0–40)
Albumin/Globulin Ratio: 1.4 (ref 1.2–2.2)
Albumin: 4.2 g/dL (ref 3.8–4.9)
Alkaline Phosphatase: 86 IU/L (ref 39–117)
BUN/Creatinine Ratio: 11 (ref 9–20)
BUN: 13 mg/dL (ref 6–24)
Bilirubin Total: <0.2 mg/dL (ref 0.0–1.2)
Calcium: 9.7 mg/dL (ref 8.7–10.2)
Chloride: 101 mmol/L (ref 96–106)
Creatinine, Ser: 1.23 mg/dL (ref 0.76–1.27)
GFR calc Af Amer: 77 mL/min/{1.73_m2} (ref >59)
GFR calc non Af Amer: 67 mL/min/{1.73_m2} (ref >59)
Globulin, Total: 3.1 g/dL (ref 1.5–4.5)
Glucose: 112 mg/dL — ABNORMAL HIGH (ref 65–99)
Potassium: 4.3 mmol/L (ref 3.5–5.2)
Sodium: 139 mmol/L (ref 134–144)
Total Protein: 7.3 g/dL (ref 6.0–8.5)

## 2018-07-04 LAB — 25-HYDROXY VITAMIN D LCMS D2+D3
25-Hydroxy, Vitamin D-2: 8.4 ng/mL
25-Hydroxy, Vitamin D-3: 6.8 ng/mL
25-Hydroxy, Vitamin D: 15 ng/mL — ABNORMAL LOW

## 2018-07-04 LAB — SEDIMENTATION RATE: Sed Rate: 27 mm/hr (ref 0–30)

## 2018-07-04 LAB — TOXASSURE SELECT 13 (MW), URINE

## 2018-07-04 LAB — C-REACTIVE PROTEIN: CRP: 12 mg/L — ABNORMAL HIGH (ref 0–10)

## 2018-07-07 DIAGNOSIS — M546 Pain in thoracic spine: Secondary | ICD-10-CM | POA: Diagnosis not present

## 2018-07-11 DIAGNOSIS — M546 Pain in thoracic spine: Secondary | ICD-10-CM | POA: Diagnosis not present

## 2018-07-13 ENCOUNTER — Other Ambulatory Visit: Payer: Self-pay

## 2018-07-13 DIAGNOSIS — R760 Raised antibody titer: Secondary | ICD-10-CM

## 2018-07-13 DIAGNOSIS — I82511 Chronic embolism and thrombosis of right femoral vein: Secondary | ICD-10-CM

## 2018-07-14 ENCOUNTER — Inpatient Hospital Stay (HOSPITAL_BASED_OUTPATIENT_CLINIC_OR_DEPARTMENT_OTHER): Payer: 59 | Admitting: Hematology and Oncology

## 2018-07-14 ENCOUNTER — Inpatient Hospital Stay: Payer: 59 | Attending: Hematology and Oncology

## 2018-07-14 ENCOUNTER — Encounter: Payer: Self-pay | Admitting: Hematology and Oncology

## 2018-07-14 VITALS — BP 118/80 | HR 65 | Temp 97.6°F | Resp 18 | Ht 71.0 in | Wt 266.0 lb

## 2018-07-14 DIAGNOSIS — Z86718 Personal history of other venous thrombosis and embolism: Secondary | ICD-10-CM | POA: Insufficient documentation

## 2018-07-14 DIAGNOSIS — G8929 Other chronic pain: Secondary | ICD-10-CM | POA: Insufficient documentation

## 2018-07-14 DIAGNOSIS — Z87891 Personal history of nicotine dependence: Secondary | ICD-10-CM

## 2018-07-14 DIAGNOSIS — Z79899 Other long term (current) drug therapy: Secondary | ICD-10-CM

## 2018-07-14 DIAGNOSIS — I82B12 Acute embolism and thrombosis of left subclavian vein: Secondary | ICD-10-CM

## 2018-07-14 DIAGNOSIS — M546 Pain in thoracic spine: Secondary | ICD-10-CM | POA: Insufficient documentation

## 2018-07-14 DIAGNOSIS — Z7901 Long term (current) use of anticoagulants: Secondary | ICD-10-CM | POA: Diagnosis not present

## 2018-07-14 DIAGNOSIS — I82511 Chronic embolism and thrombosis of right femoral vein: Secondary | ICD-10-CM

## 2018-07-14 DIAGNOSIS — R760 Raised antibody titer: Secondary | ICD-10-CM

## 2018-07-14 LAB — CBC WITH DIFFERENTIAL/PLATELET
Abs Immature Granulocytes: 0.03 10*3/uL (ref 0.00–0.07)
Basophils Absolute: 0 10*3/uL (ref 0.0–0.1)
Basophils Relative: 0 %
Eosinophils Absolute: 0.3 10*3/uL (ref 0.0–0.5)
Eosinophils Relative: 4 %
HCT: 44.8 % (ref 39.0–52.0)
Hemoglobin: 14.4 g/dL (ref 13.0–17.0)
Immature Granulocytes: 0 %
Lymphocytes Relative: 28 %
Lymphs Abs: 2 10*3/uL (ref 0.7–4.0)
MCH: 29.6 pg (ref 26.0–34.0)
MCHC: 32.1 g/dL (ref 30.0–36.0)
MCV: 92.2 fL (ref 80.0–100.0)
Monocytes Absolute: 0.8 10*3/uL (ref 0.1–1.0)
Monocytes Relative: 11 %
Neutro Abs: 4.1 10*3/uL (ref 1.7–7.7)
Neutrophils Relative %: 57 %
Platelets: 303 10*3/uL (ref 150–400)
RBC: 4.86 MIL/uL (ref 4.22–5.81)
RDW: 12.8 % (ref 11.5–15.5)
WBC: 7.2 10*3/uL (ref 4.0–10.5)
nRBC: 0 % (ref 0.0–0.2)

## 2018-07-14 LAB — COMPREHENSIVE METABOLIC PANEL
ALT: 39 U/L (ref 0–44)
AST: 25 U/L (ref 15–41)
Albumin: 4 g/dL (ref 3.5–5.0)
Alkaline Phosphatase: 77 U/L (ref 38–126)
Anion gap: 5 (ref 5–15)
BUN: 12 mg/dL (ref 6–20)
CO2: 26 mmol/L (ref 22–32)
Calcium: 8.9 mg/dL (ref 8.9–10.3)
Chloride: 105 mmol/L (ref 98–111)
Creatinine, Ser: 1.27 mg/dL — ABNORMAL HIGH (ref 0.61–1.24)
GFR calc Af Amer: >60 mL/min (ref >60)
GFR calc non Af Amer: >60 mL/min (ref >60)
Glucose, Bld: 114 mg/dL — ABNORMAL HIGH (ref 70–99)
Potassium: 4.1 mmol/L (ref 3.5–5.1)
Sodium: 136 mmol/L (ref 135–145)
Total Bilirubin: 0.4 mg/dL (ref 0.3–1.2)
Total Protein: 7.5 g/dL (ref 6.5–8.1)

## 2018-07-14 NOTE — Progress Notes (Signed)
[facility] day:  07/14/2018   Chief Complaint: Justin [name] is a 53 y.o. male with recurrent thrombosis who is seen for 3 month assessment on Lovenox.    HPI:  The patient was last seen in the hematology clinic on 04/07/2018.  At that time, he was doing well. Pain had improved in his RIGHT ribs following radiofrequency nerve ablation procedure.  Post-procedural infection had resolved. He denied any B symptoms, shortness of breath or chest pain. Eating well; weight was stable.  He was on enoxaparin injections twice daily.  Exam was grossly unremarkable.  BUN was 12 and creatinine 1.36 (CrCl 86.4 mL/min).  He was seen in the [facility] ER on 06/29/2018.   Notes reviewed.  He complained of right knee pain in the posterior area, radiating into the calf, gradual onset about 4 or 5 days prior, waxing and waning, no aggravating relieving intensity.  He denied any chest pain, shortness of breath, fevers or chills.  He was compliant on Lovenox.  Right lower extremity duplex revealed no evidence of DVT.  Etiology of his knee pain was felt musculoskeletal related to MCL and possibly aggravation of an old meniscus injury, recent PT or the weather.  Aleve, ACE wrap and follow-up with PC were recommended.  During the interim, patient continues to have RIGHT lateral rib pain. He states, "they told me the white meat between by bones got stiff because I didn't twist enough at first".   Patient denies any shortness of breath or retrosternal chest pain. Previous claudication pain on 06/29/2018 was found to not be related to a DVT. He states, "I think it really was a clot though and the shots dissolved it. It felt just like it did when I had my blood clot before". Patient remains on enoxaparin injections. He denies any excess bruising or bleeding.   Patient advises that he maintains an adequate appetite. He is eating well. Weight today is 266 lb (120.7 kg), which compared to his  last visit to the clinic, represents a 1 pound increase.   Patient complains of chronic RIGHT thoracic pain rated 7/10 in the clinic today.   Past Medical History:  Diagnosis Date  . Arm vein blood clot    right  . Coronary artery disease   . DVT (deep venous thrombosis) ([place]) 2012; January 2016   History of right arm; now right lower shotty  . GERD (gastroesophageal reflux disease)   . Rib pain on right side 08/04/2016   RT rib pain that radiates around to the back. Level 8 of 10.  Constant. Started 1 mo ago, 1 week after DVT.  Marland Kitchen Vascular thoracic outlet syndrome    Right     Past Surgical History:  Procedure Laterality Date  . CAROTID ANGIOGRAM    . KNEE CARTILAGE SURGERY     Performed for cracked patella and torn meniscus  . Right upper extremity thrombectomy  2012  . SCALENOTOMY W/O RESECTION CERVICAL RIB  2012   4 right-sided thoracic outlet syndrome    Family History  Problem Relation Age of Onset  . Heart disease Father   . Breast cancer Father 6  . Diabetes Father   . Heart attack Father   . Cancer Maternal Grandmother   . Leukemia Maternal Grandfather   . Prostate cancer Maternal Uncle     Social History:  reports that he has quit smoking. He smoked 0.50 packs per day. He has never used smokeless tobacco.  He reports that he does not drink alcohol or use drugs.  He previously smoked 1/2 pack a day for 10 years.  He stopped smoking in his 8s.  He lives in [place].  He is a Glass blower/designer.  He denies any exposure to radiation or toxins.  He has a son.  The patient's wife is [name].  He is alone today.  Allergies: No Known Allergies  Current Medications: Current Outpatient Medications  Medication Sig Dispense Refill  . atorvastatin (LIPITOR) 10 MG tablet Take 1 tablet (10 mg total) by mouth daily. 90 tablet 1  . Calcium Carbonate-Vit D-Min (GNP CALCIUM 1200) 1200-1000 MG-UNIT CHEW Chew 1,200 mg by mouth daily with breakfast. Take in combination with vitamin D and  magnesium. 30 tablet 5  . enoxaparin (LOVENOX) 150 MG/ML injection INJECT 0.74 MLS (110 MG TOTAL) INTO THE SKIN 2 (TWO) TIMES DAILY 60 Syringe 0  . gabapentin (NEURONTIN) 300 MG capsule Take 1 capsule (300 mg total) by mouth 3 (three) times daily. Take 1 tab TID, can take an extra tab at bedtime (Patient taking differently: Take 300 mg by mouth 3 (three) times daily. Take 1 tab TID, can take an extra tab at bedtime) 120 capsule 1  . [START ON 09/03/2018] oxyCODONE (OXY IR/ROXICODONE) 5 MG immediate release tablet Take 1 tablet (5 mg total) by mouth every 8 (eight) hours as needed for up to 30 days for severe pain. 90 tablet 0  . [START ON 08/04/2018] oxyCODONE (OXY IR/ROXICODONE) 5 MG immediate release tablet Take 1 tablet (5 mg total) by mouth every 8 (eight) hours as needed for up to 30 days for severe pain. 90 tablet 0  . acetaminophen (TYLENOL) 500 MG tablet Take 1,000 mg by mouth every 6 (six) hours as needed.    Marland Kitchen ibuprofen (ADVIL,MOTRIN) 800 MG tablet Take 800 mg by mouth every 8 (eight) hours as needed.    . naproxen (NAPROSYN) 500 MG tablet Take 1 tablet (500 mg total) by mouth 2 (two) times daily with a meal. (Patient not taking: Reported on 07/14/2018) 20 tablet 0  . oxyCODONE (OXY IR/ROXICODONE) 5 MG immediate release tablet Take 1 tablet (5 mg total) by mouth every 8 (eight) hours as needed for up to 30 days for severe pain. 90 tablet 0   No current facility-administered medications for this visit.     Review of Systems  Constitutional: Negative.  Negative for chills, diaphoresis, fever, malaise/fatigue and weight loss (up 1 pound).       Feels "ok".  HENT: Negative.  Negative for congestion, ear pain, nosebleeds, sinus pain and sore throat.   Eyes: Negative.  Negative for blurred vision, double vision and photophobia.  Respiratory: Negative.  Negative for hemoptysis, sputum production and shortness of breath.   Cardiovascular: Negative.  Negative for leg swelling and PND.   Gastrointestinal: Negative.  Negative for abdominal pain, blood in stool, constipation, diarrhea, heartburn, melena, nausea and vomiting.  Genitourinary: Negative.  Negative for dysuria, frequency, hematuria and urgency.  Musculoskeletal: Positive for joint pain (LEFT hip). Negative for back pain, falls, myalgias and neck pain.       RIGHT lateral chest wall pain, chronic.  Skin: Negative.  Negative for itching and rash.  Neurological: Negative.  Negative for dizziness, tremors, focal weakness, weakness and headaches.  Endo/Heme/Allergies: Bruises/bleeds easily (on enoxaparin injections).  Psychiatric/Behavioral: Negative.  Negative for depression and memory loss. The patient is not nervous/anxious and does not have insomnia.   All other systems reviewed and are  negative.  Performance status (ECOG): 1  Vital Signs BP 118/80   Pulse 65   Temp 97.6 F (36.4 C) (Tympanic)   Resp 18   Ht 5\' 11"  (1.803 m)   Wt 266 lb (120.7 kg)   SpO2 96%   BMI 37.10 kg/m   Physical Exam  Constitutional: He is oriented to person, place, and time and well-developed, well-nourished, and in no distress. No distress.  HENT:  Head: Normocephalic and atraumatic.  Mouth/Throat: Oropharynx is clear and moist and mucous membranes are normal. No oropharyngeal exudate.  Wearing a light blue [employer] cap.  Short graying hair.  Eyes: Pupils are equal, round, and reactive to light. Conjunctivae and EOM are normal. No scleral icterus.  Glasses.  Brown eyes.  Neck: Normal range of motion. Neck supple. No JVD present.  Cardiovascular: Normal rate, regular rhythm, normal heart sounds and intact distal pulses. Exam reveals no gallop and no friction rub.  No murmur heard. Pulmonary/Chest: Effort normal and breath sounds normal. No respiratory distress. He has no wheezes. He has no rales.  Abdominal: Soft. Bowel sounds are normal. He exhibits no distension and no mass. There is no abdominal tenderness. There is no rebound and  no guarding.  Musculoskeletal: Normal range of motion.        General: No tenderness or edema.  Lymphadenopathy:    He has no cervical adenopathy.  Neurological: He is alert and oriented to person, place, and time. Gait normal.  Skin: Skin is warm and dry. No rash noted. He is not diaphoretic. No erythema. No pallor.  Psychiatric: Mood, affect and judgment normal.  Nursing note and vitals reviewed.   Appointment on 07/14/2018  Component Date Value Ref Range Status  . Sodium 07/14/2018 136  135 - 145 mmol/L Final  . Potassium 07/14/2018 4.1  3.5 - 5.1 mmol/L Final  . Chloride 07/14/2018 105  98 - 111 mmol/L Final  . CO2 07/14/2018 26  22 - 32 mmol/L Final  . Glucose, Bld 07/14/2018 114* 70 - 99 mg/dL Final  . BUN 07/14/2018 12  6 - 20 mg/dL Final  . Creatinine, Ser 07/14/2018 1.27* 0.61 - 1.24 mg/dL Final  . Calcium 07/14/2018 8.9  8.9 - 10.3 mg/dL Final  . Total Protein 07/14/2018 7.5  6.5 - 8.1 g/dL Final  . Albumin 07/14/2018 4.0  3.5 - 5.0 g/dL Final  . AST 07/14/2018 25  15 - 41 U/L Final  . ALT 07/14/2018 39  0 - 44 U/L Final  . Alkaline Phosphatase 07/14/2018 77  38 - 126 U/L Final  . Total Bilirubin 07/14/2018 0.4  0.3 - 1.2 mg/dL Final  . GFR calc non Af Amer 07/14/2018 >60  >60 mL/min Final  . GFR calc Af Amer 07/14/2018 >60  >60 mL/min Final  . Anion gap 07/14/2018 5  5 - 15 Final   Performed at [facility], [address], [place], [place] [address]  . WBC 07/14/2018 7.2  4.0 - 10.5 K/uL Final   WHITE COUNT CONFIRMED ON SMEAR  . RBC 07/14/2018 4.86  4.22 - 5.81 MIL/uL Final  . Hemoglobin 07/14/2018 14.4  13.0 - 17.0 g/dL Final  . HCT 07/14/2018 44.8  39.0 - 52.0 % Final  . MCV 07/14/2018 92.2  80.0 - 100.0 fL Final  . MCH 07/14/2018 29.6  26.0 - 34.0 pg Final  . MCHC 07/14/2018 32.1  30.0 - 36.0 g/dL Final  . RDW 07/14/2018 12.8  11.5 - 15.5 % Final  . Platelets  07/14/2018 303  150 - 400 K/uL Final  . nRBC 07/14/2018 0.0  0.0 - 0.2 % Final   Performed at  [facility], [address] [place], [place] [address]  . Neutrophils Relative % 07/14/2018 PENDING  % Incomplete  . Neutro Abs 07/14/2018 PENDING  1.7 - 7.7 K/uL Incomplete  . Band Neutrophils 07/14/2018 PENDING  % Incomplete  . Lymphocytes Relative 07/14/2018 PENDING  % Incomplete  . Lymphs Abs 07/14/2018 PENDING  0.7 - 4.0 K/uL Incomplete  . Monocytes Relative 07/14/2018 PENDING  % Incomplete  . Monocytes Absolute 07/14/2018 PENDING  0.1 - 1.0 K/uL Incomplete  . Eosinophils Relative 07/14/2018 PENDING  % Incomplete  . Eosinophils Absolute 07/14/2018 PENDING  0.0 - 0.5 K/uL Incomplete  . Basophils Relative 07/14/2018 PENDING  % Incomplete  . Basophils Absolute 07/14/2018 PENDING  0.0 - 0.1 K/uL Incomplete  . WBC Morphology 07/14/2018 PENDING   Incomplete  . RBC Morphology 07/14/2018 PENDING   Incomplete  . Smear Review 07/14/2018 PENDING   Incomplete  . Other 07/14/2018 PENDING  % Incomplete  . nRBC 07/14/2018 PENDING  0 /100 WBC Incomplete  . Metamyelocytes Relative 07/14/2018 PENDING  % Incomplete  . Myelocytes 07/14/2018 PENDING  % Incomplete  . Promyelocytes Relative 07/14/2018 PENDING  % Incomplete  . Blasts 07/14/2018 PENDING  % Incomplete    Assessment:  Justin [name] is a 53 y.o. male with recurrent thrombosis x 4.  He developed a right upper extremity clot in 2010. He underwent thrombolysis.  He was on Coumadin x 6 months.  He developed a recurrent clot in the right upper extremity in 2011.  He underwent thoracic outlet decompression.  He was on Coumadin for 6-9 months.  He developed a right lower extremity DVT in 2016.  He was started on Xarelto.  He developed a left upper extremity DVT on 06/07/2016 while on Xarelto.  Duplex revealed a near occlusive DVT in the lateral aspect of the left subclavian vein and nonocclusive thrombus extending into the adjacent axillary vein.  He was started on Lovenox.  Hypercoagulable work-up on 06/29/2016 revealed the following  normal studies:  CBC with diff, Factor V Leiden, prothrombin gene mutation, protein C antigen and activity, protein S antigen and activity, ATIII antigen and activity, anticardiolipin antibodies and beta-2 glycoprotein antibodies.  Lupus anticoagulant panel was positive on Xarelto and Lovenox.  Chest CT angiogram on 07/02/2016 revealed no evidence of pulmonary embolism.  There were small pulmonary nodules, largest 3 mm, of unclear significance.  Bone scan on 08/04/2016 revealed a focus of moderately increased uptake at approximately the T9 right paravertebral region. Chest CT on 07/20/2016 revealed a large right sided osteophyte at the T9-T10 level which was felt to explain the increased right paravertebral uptake uptake.  There  was also mildly increased uptake in the body of L2. Review of the lumbar spine on the abdominal CT scan of the same date revealed no definite radiographic abnormality in the body of L2 to explain the mildly increased uptake.  There was no abnormal uptake within the ribs or elsewhere within the skeleton.  Thoracic spine MRI on 09/14/2016 revealed mild degenerative disc disease in the thoracic spine with non-compressive disc bulges at T5-6 and below.  There was no disc herniation or compressive stenosis of the canal or foramina.  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 On the right at T9-10, there was fairly bulky osteophytes emanating from the right  costovertebral articulation. There was no evidence of neural compression secondary to this or the other bony degenerative changes.  There was ordinary mild facet osteoarthritis without edematous change or significant hypertrophy.  He underwent right lateral mini-thoracotomy for resection of a bony rib lesion on 01/14/2017.  Notes indicate he underwent thoracic discectomy with decompression by anterior approach.  Pathology revealed an "exophytic lesion".  He has  had right sided rib pain post-operatively.  Bone density on 12/06/2017 revealed a normal T score of -0.6 in the right femoral neck.  Symptomatically, he continues to have right rib pain.  Exam is stable.  Hemoglobin is 14.4.  Creatinine is 1.27.  Plan: 1. Labs today:  CBC with diff, CMP. 2. Recurrent thrombosis Clinically doing well. Continue Lovenox q 12 hours. Review plan for follow-up lupus anticoagulant testing. 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 Pain managed by Dr. Dossie Arbour (pain management). Patient notes follow-up visit with surgery every 6 months 4. RTC in 3 months for MD assessment and labs (CBC with diff, CMP).    Honor Loh, NP 07/14/18, 2:36 PM  I saw and evaluated the patient, participating in the key portions of the service and reviewing pertinent diagnostic studies and records.  I reviewed the nurse practitioner's note and agree with the findings and the plan.  The assessment and plan were discussed with the patient.  Several questions were asked by the patient and answered.   Nolon Stalls, MD 07/14/18, 2:36 PM

## 2018-07-18 DIAGNOSIS — M546 Pain in thoracic spine: Secondary | ICD-10-CM | POA: Diagnosis not present

## 2018-07-21 DIAGNOSIS — M546 Pain in thoracic spine: Secondary | ICD-10-CM | POA: Diagnosis not present

## 2018-08-08 ENCOUNTER — Other Ambulatory Visit: Payer: Self-pay | Admitting: Hematology and Oncology

## 2018-08-08 DIAGNOSIS — R918 Other nonspecific abnormal finding of lung field: Secondary | ICD-10-CM

## 2018-08-08 DIAGNOSIS — M898X1 Other specified disorders of bone, shoulder: Secondary | ICD-10-CM

## 2018-08-08 DIAGNOSIS — R760 Raised antibody titer: Secondary | ICD-10-CM

## 2018-08-08 DIAGNOSIS — I82B12 Acute embolism and thrombosis of left subclavian vein: Secondary | ICD-10-CM

## 2018-08-14 ENCOUNTER — Telehealth: Payer: Self-pay | Admitting: Family Medicine

## 2018-08-14 ENCOUNTER — Ambulatory Visit: Payer: Self-pay

## 2018-08-14 NOTE — Telephone Encounter (Signed)
Pt c/o urinary burning x 3 times today. Pt stated that his urine has blood in it (pink) in color. No fever. Pt stated he may have had side pain and scrotal pain 2 days ago. Pain is moderate.  Care advice given and pt verbalized understanding. Pt has appt already for tomorrow.  Reason for Disposition . Blood in urine (red, pink, or tea-colored)  Answer Assessment - Initial Assessment Questions 1. SEVERITY: "How bad is the pain?"  (e.g., Scale 1-10; mild, moderate, or severe)   - MILD (1-3): complains slightly about urination hurting   - MODERATE (4-7): interferes with normal activities     - SEVERE (8-10): excruciating, unwilling or unable to urinate because of the pain      moderate  2. FREQUENCY: "How many times have you had painful urination today?"      3 times 3. PATTERN: "Is pain present every time you urinate or just sometimes?"      Every time 4. ONSET: "When did the painful urination start?"      today 5. FEVER: "Do you have a fever?" If so, ask: "What is your temperature, how was it measured, and when did it start?"     no 6. PAST UTI: "Have you had a urine infection before?" If so, ask: "When was the last time?" and "What happened that time?"      no 7. CAUSE: "What do you think is causing the painful urination?"      UTI 8. OTHER SYMPTOMS: "Do you have any other symptoms?" (e.g., flank pain, penile discharge, scrotal pain, blood in urine)     Blood in urine, flank pain yesterday, scrotal pain 2 days ago  Protocols used: URINATION PAIN - MALE-A-AH

## 2018-08-14 NOTE — Telephone Encounter (Signed)
Ok to switch  Copied from [employer] [id number]. Topic: Appointment Scheduling - Transfer of Care >> Aug 14, 2018  2:54 PM Justin [name] wrote: Pt is requesting to transfer FROM:  Justin [name] Pt is requesting to transfer TO:   Justin [name] Reason for requested transfer: Patient stated he does not know why he was put with Justin [name], because all of his appts have been with Justin [name], and he would like to stay with Justin [name].  Send CRM to patient's current PCP (transferring FROM). >> Aug 14, 2018  3:48 PM Jill Side wrote: Is this ok with you?

## 2018-08-15 ENCOUNTER — Encounter: Payer: Self-pay | Admitting: Family Medicine

## 2018-08-15 ENCOUNTER — Ambulatory Visit (INDEPENDENT_AMBULATORY_CARE_PROVIDER_SITE_OTHER): Payer: 59 | Admitting: Family Medicine

## 2018-08-15 VITALS — BP 112/72 | HR 85 | Temp 97.8°F | Ht 71.0 in | Wt 267.1 lb

## 2018-08-15 DIAGNOSIS — R319 Hematuria, unspecified: Secondary | ICD-10-CM | POA: Diagnosis not present

## 2018-08-15 DIAGNOSIS — R300 Dysuria: Secondary | ICD-10-CM | POA: Diagnosis not present

## 2018-08-15 LAB — UA/M W/RFLX CULTURE, ROUTINE
Bilirubin, UA: NEGATIVE
Glucose, UA: NEGATIVE
Ketones, UA: NEGATIVE
Leukocytes, UA: NEGATIVE
Nitrite, UA: NEGATIVE
Protein, UA: NEGATIVE
Specific Gravity, UA: 1.015 (ref 1.005–1.030)
Urobilinogen, Ur: 0.2 mg/dL (ref 0.2–1.0)
pH, UA: 6 (ref 5.0–7.5)

## 2018-08-15 LAB — MICROSCOPIC EXAMINATION
Bacteria, UA: NONE SEEN
WBC, UA: NONE SEEN /hpf (ref 0–5)

## 2018-08-15 MED ORDER — TAMSULOSIN HCL 0.4 MG PO CAPS: 0.4000 mg | ORAL_CAPSULE | Freq: Every day | ORAL | 1 refills | Status: DC

## 2018-08-15 NOTE — Progress Notes (Signed)
BP 112/72 (BP Location: Left Arm, Patient Position: Sitting, Cuff Size: Normal)   Pulse 85   Temp 97.8 F (36.6 C) (Oral)   Ht 5\' 11"  (1.803 m)   Wt 267 lb 1.6 oz (121.2 kg)   SpO2 98%   BMI 37.25 kg/m    Subjective:    Patient ID: Justin [name], male    DOB: 1965/07/29, 53 y.o.   MRN: [id number]  HPI: Justin [name] is a 53 y.o. male  Chief Complaint  Patient presents with  . Flank Pain    Right sided. Ongoing off and on for 2 weeks. Patient stated yesterday symptoms became worse with dysuria and hematuria.   . Testicle Pain  . Dysuria  . Hematuria   2 weeks of scrotal pain, lower back pain on the right (now resolved for most part), dysuria, hematuria. Known stones in right kidney. Denies fever, chills, constipation, nausea, vomiting. Followed by pain management and on chronic oxycodone, states this is helping ease the pain of his current issue. Otherwise, not trying anything for sxs.   Relevant past medical, surgical, family and social history reviewed and updated as indicated. Interim medical history since our last visit reviewed. Allergies and medications reviewed and updated.  Review of Systems  Per HPI unless specifically indicated above     Objective:    BP 112/72 (BP Location: Left Arm, Patient Position: Sitting, Cuff Size: Normal)   Pulse 85   Temp 97.8 F (36.6 C) (Oral)   Ht 5\' 11"  (1.803 m)   Wt 267 lb 1.6 oz (121.2 kg)   SpO2 98%   BMI 37.25 kg/m   Wt Readings from Last 3 Encounters:  08/15/18 267 lb 1.6 oz (121.2 kg)  07/14/18 266 lb (120.7 kg)  06/29/18 248 lb (112.5 kg)    Physical Exam Vitals signs and nursing note reviewed.  Constitutional:      Appearance: Normal appearance.  HENT:     Head: Atraumatic.  Eyes:     Extraocular Movements: Extraocular movements intact.     Conjunctiva/sclera: Conjunctivae normal.  Neck:     Musculoskeletal: Normal range of motion and neck supple.  Cardiovascular:     Rate and Rhythm: Normal rate  and regular rhythm.  Pulmonary:     Effort: Pulmonary effort is normal.     Breath sounds: Normal breath sounds.  Abdominal:     General: Bowel sounds are normal.     Palpations: Abdomen is soft.     Tenderness: There is no abdominal tenderness. There is no right CVA tenderness, left CVA tenderness or guarding.  Genitourinary:    Comments: Declines genital exam Musculoskeletal: Normal range of motion.  Skin:    General: Skin is warm and dry.  Neurological:     General: No focal deficit present.     Mental Status: He is oriented to person, place, and time.  Psychiatric:        Mood and Affect: Mood normal.        Thought Content: Thought content normal.        Judgment: Judgment normal.     Results for orders placed or performed in visit on 08/15/18  Microscopic Examination  Result Value Ref Range   WBC, UA None seen 0 - 5 /hpf   RBC, UA 3-10 (A) 0 - 2 /hpf   Epithelial Cells (non renal) 0-10 0 - 10 /hpf   Bacteria, UA None seen None seen/Few  UA/M w/rflx Culture, Routine  Result Value  Ref Range   Specific Gravity, UA 1.015 1.005 - 1.030   pH, UA 6.0 5.0 - 7.5   Color, UA Yellow Yellow   Appearance Ur Clear Clear   Leukocytes, UA Negative Negative   Protein, UA Negative Negative/Trace   Glucose, UA Negative Negative   Ketones, UA Negative Negative   RBC, UA 2+ (A) Negative   Bilirubin, UA Negative Negative   Urobilinogen, Ur 0.2 0.2 - 1.0 mg/dL   Nitrite, UA Negative Negative   Microscopic Examination See below:       Assessment & Plan:   Problem List Items Addressed This Visit    None    Visit Diagnoses    Hematuria, unspecified type    -  Primary   U/A neg for infection, pos for RBCs. Given sxs, suspect kidney stone. Flomax given, cont pain regimen, push fluids. F/u if worsening for CT or Urology referral   Relevant Orders   UA/M w/rflx Culture, Routine (Completed)       Follow up plan: Return for as scheduled.

## 2018-09-06 ENCOUNTER — Other Ambulatory Visit: Payer: Self-pay | Admitting: Family Medicine

## 2018-09-07 ENCOUNTER — Telehealth: Payer: Self-pay | Admitting: Family Medicine

## 2018-09-07 NOTE — Telephone Encounter (Signed)
Called pt to reschedule physical. Pt wanted me to let you know that he still hasn't seen any signs of passing kidney stone and that he's still in a little pain but not as much as he was. Please advise.

## 2018-09-07 NOTE — Telephone Encounter (Signed)
At this point we can continue to watch and wait or go ahead and get some imaging to see if there are stones/get measurements if so

## 2018-09-07 NOTE — Telephone Encounter (Signed)
Spoke with patient, he does not want to do imaging at this time, he will call back if the pain gets worse.

## 2018-09-14 ENCOUNTER — Encounter: Payer: 59 | Admitting: Family Medicine

## 2018-09-26 ENCOUNTER — Other Ambulatory Visit: Payer: Self-pay

## 2018-09-26 ENCOUNTER — Ambulatory Visit: Payer: 59 | Attending: Nurse Practitioner | Admitting: Nurse Practitioner

## 2018-09-26 DIAGNOSIS — M5414 Radiculopathy, thoracic region: Secondary | ICD-10-CM

## 2018-09-26 DIAGNOSIS — G588 Other specified mononeuropathies: Secondary | ICD-10-CM

## 2018-09-26 DIAGNOSIS — M546 Pain in thoracic spine: Secondary | ICD-10-CM | POA: Diagnosis not present

## 2018-09-26 DIAGNOSIS — G8929 Other chronic pain: Secondary | ICD-10-CM

## 2018-09-26 DIAGNOSIS — G894 Chronic pain syndrome: Secondary | ICD-10-CM | POA: Diagnosis not present

## 2018-09-26 MED ORDER — OXYCODONE HCL 5 MG PO TABS: 5.0000 mg | ORAL_TABLET | Freq: Three times a day (TID) | ORAL | 0 refills | Status: DC | PRN

## 2018-09-26 NOTE — Progress Notes (Signed)
Pain Management Encounter Note - Virtual Visit via Telephone Telehealth (real-time audio visits between healthcare provider and patient).  Patient's Phone No. & Preferred Pharmacy:  [phone] (home); [phone] (mobile); (Preferred) [phone]  [facility], [place] [address] Phone: [phone] Fax: [phone]  [facility] [place] [facility] [address] [place] [address] Phone: [phone] Fax: [phone]   Pre-screening note:  Our staff contacted Justin [name] and offered him an "in person", "face-to-face" appointment versus a telephone encounter. He indicated preferring the telephone encounter, at this time.  Reason for Virtual Visit: COVID-19*  Social distancing based on CDC and AMA recommendations.   I contacted Justin [name] on 09/26/2018 at 9:00 AM by telephone and clearly identified myself as Dionisio David, NP. I verified that I was speaking with the correct person using two identifiers (Name and date of birth: December 08, 1965).  Advanced Informed Consent I sought verbal advanced consent from Justin [name] for telemedicine interactions and virtual visit. I informed Justin [name] of the security and privacy concerns, risks, and limitations associated with performing an evaluation and management service by telephone. I also informed Justin [name] of the availability of "in person" appointments and I informed him of the possibility of a patient responsible charge related to this service. Justin [name] expressed understanding and agreed to proceed.   Historic Elements   Justin [name] is a 53 y.o. year old, male patient evaluated today after his last encounter by our practice on 06/29/2018. Justin [name]  has a past medical history of Arm vein blood clot, Coronary artery disease, DVT (deep venous thrombosis) ([place]) (2012; January  2016), GERD (gastroesophageal reflux disease), Rib pain on right side (08/04/2016), and Vascular thoracic outlet syndrome. He also  has a past surgical history that includes Knee cartilage surgery; Carotid angiogram; Scalenotomy w/o resection cervical rib (2012); and Right upper extremity thrombectomy (2012). Justin [name] has a current medication list which includes the following prescription(s): acetaminophen, atorvastatin, gnp calcium 1200, enoxaparin, gabapentin, ibuprofen, oxycodone, oxycodone, oxycodone, and tamsulosin. He  reports that he has quit smoking. He smoked 0.50 packs per day. He has never used smokeless tobacco. He reports that he does not drink alcohol or use drugs. Justin [name] has No Known Allergies.   HPI  I last saw him on 06/29/2018. He is being evaluated for medication management. He having 7/10 right middle rib area. He denies any radiating pain. He feels like it may radiate to the back a little but mostly stays in that area. He denies numbness or tingling. He describes it as a constant aching and throbbing pain. He feels like the pain is about the same. He denies any new concerns related to your pain. He does not feel like he is the need of any procedures at this time. He called with Dr Windy Fast office to have an ultrasound for kidney stone. He states that the Oxycodone has helped with the pain, however his is waiting for the ultrasound for further evaluation.   Pharmacotherapy Assessment  Analgesic:OxycodoneIR5 mg1 tablets3times daily(oxycodone15mg /day) MME/day:22.5mg /day  Monitoring: Pharmacotherapy: No side-effects or adverse reactions reported. [place] PMP: PDMP reviewed during this encounter.       Compliance: No problems identified. Plan: Refer to "POC".  Review of recent tests  US Venous Img Lower Unilateral Right CLINICAL DATA:  Right leg pain and swelling for 3 days  EXAM: RIGHT LOWER EXTREMITY VENOUS DOPPLER ULTRASOUND  TECHNIQUE: Gray-scale sonography with  graded compression, as well as color Doppler and duplex ultrasound were performed to evaluate the lower extremity deep venous systems from the level of the common femoral vein and including the common femoral, femoral, profunda femoral, popliteal and calf veins including the posterior tibial, peroneal and gastrocnemius veins when visible. The superficial great saphenous vein was also interrogated. Spectral Doppler was utilized to evaluate flow at rest and with distal augmentation maneuvers in the common femoral, femoral and popliteal veins.  COMPARISON:  None.  FINDINGS: Contralateral Common Femoral Vein: Respiratory phasicity is normal and symmetric with the symptomatic side. No evidence of thrombus. Normal compressibility.  Common Femoral Vein: No evidence of thrombus. Normal compressibility, respiratory phasicity and response to augmentation.  Saphenofemoral Junction: No evidence of thrombus. Normal compressibility and flow on color Doppler imaging.  Profunda Femoral Vein: No evidence of thrombus. Normal compressibility and flow on color Doppler imaging.  Femoral Vein: No evidence of thrombus. Normal compressibility, respiratory phasicity and response to augmentation.  Popliteal Vein: No evidence of thrombus. Normal compressibility, respiratory phasicity and response to augmentation.  Calf Veins: No evidence of thrombus. Normal compressibility and flow on color Doppler imaging.  Superficial Great Saphenous Vein: No evidence of thrombus. Normal compressibility.  Venous Reflux:  None.  Other Findings:  None.  IMPRESSION: No evidence of deep venous thrombosis.  Electronically Signed   By: Inez Catalina M.D.   On: 06/29/2018 16:30   Office Visit on 08/15/2018  Component Date Value Ref Range Status  . Specific Gravity, UA 08/15/2018 1.015  1.005 - 1.030 Final  . pH, UA 08/15/2018 6.0  5.0 - 7.5 Final  . Color, UA 08/15/2018 Yellow  Yellow Final  . Appearance Ur  08/15/2018 Clear  Clear Final  . Leukocytes, UA 08/15/2018 Negative  Negative Final  . Protein, UA 08/15/2018 Negative  Negative/Trace Final  . Glucose, UA 08/15/2018 Negative  Negative Final  . Ketones, UA 08/15/2018 Negative  Negative Final  . RBC, UA 08/15/2018 2+* Negative Final  . Bilirubin, UA 08/15/2018 Negative  Negative Final  . Urobilinogen, Ur 08/15/2018 0.2  0.2 - 1.0 mg/dL Final  . Nitrite, UA 08/15/2018 Negative  Negative Final  . Microscopic Examination 08/15/2018 See below:   Final  . WBC, UA 08/15/2018 None seen  0 - 5 /hpf Final  . RBC, UA 08/15/2018 3-10* 0 - 2 /hpf Final  . Epithelial Cells (non renal) 08/15/2018 0-10  0 - 10 /hpf Final  . Bacteria, UA 08/15/2018 None seen  None seen/Few Final   Assessment  The primary encounter diagnosis was Chronic thoracic back pain (Primary Area of Pain) (Right). Diagnoses of Chronic pain syndrome, Thoracic radiculitis (Secondary Area of Pain), and Intercostal neuralgia (T6, T7) (Right) were also pertinent to this visit.  Plan of Care  I am having Justin [name] maintain his gabapentin, GNP Calcium 1200, atorvastatin, acetaminophen, ibuprofen, enoxaparin, tamsulosin, oxyCODONE, oxyCODONE, and oxyCODONE.  Pharmacotherapy (Medications Ordered): Meds ordered this encounter  Medications  . oxyCODONE (OXY IR/ROXICODONE) 5 MG immediate release tablet    Sig: Take 1 tablet (5 mg total) by mouth every 8 (eight) hours as needed for up to 30 days for severe pain.    Dispense:  90 tablet    Refill:  0    Do not place this medication, or any other prescription from our practice, on "Automatic Refill". Patient may have prescription filled one day early if pharmacy is closed on  scheduled refill date.    Order Specific Question:   Supervising Provider    Answer:   Milinda Pointer [phone]  . oxyCODONE (OXY IR/ROXICODONE) 5 MG immediate release tablet    Sig: Take 1 tablet (5 mg total) by mouth every 8 (eight) hours as needed for up to  30 days for severe pain.    Dispense:  90 tablet    Refill:  0    Do not place this medication, or any other prescription from our practice, on "Automatic Refill". Patient may have prescription filled one day early if pharmacy is closed on scheduled refill date.    Order Specific Question:   Supervising Provider    Answer:   Milinda Pointer [phone]  . oxyCODONE (OXY IR/ROXICODONE) 5 MG immediate release tablet    Sig: Take 1 tablet (5 mg total) by mouth every 8 (eight) hours as needed for up to 30 days for severe pain.    Dispense:  90 tablet    Refill:  0    Do not place this medication on "Automatic Refill". Patient may have prescription filled one day early if pharmacy is closed on scheduled refill date.    Order Specific Question:   Supervising Provider    Answer:   Milinda Pointer [phone]   Orders:  No orders of the defined types were placed in this encounter.  Follow-up plan:   Return in about 3 months (around 12/26/2018) for MedMgmt.   I discussed the assessment and treatment plan with the patient. The patient was provided an opportunity to ask questions and all were answered. The patient agreed with the plan and demonstrated an understanding of the instructions.  Patient advised to call back or seek an in-person evaluation if the symptoms or condition worsens.  Total duration of non-face-to-face encounter: 12 minutes.  Note by: Dionisio David, NP Date: 09/26/2018; Time: 9:13 AM  Disclaimer:  * Given the special circumstances of the COVID-19 pandemic, the federal government has announced that the Office for Civil Rights (OCR) will exercise its enforcement discretion and will not impose penalties on physicians using telehealth in the event of noncompliance with regulatory requirements under the [place] (HIPAA) in connection with the good faith provision of telehealth during the QVZDG-38 national public health emergency. ([place])

## 2018-09-26 NOTE — Patient Instructions (Signed)
____________________________________________________________________________________________  Medication Rules  Purpose: To inform patients, and their family members, of our rules and regulations.  Applies to: All patients receiving prescriptions (written or electronic).  Pharmacy of record: Pharmacy where electronic prescriptions will be sent. If written prescriptions are taken to a different pharmacy, please inform the nursing staff. The pharmacy listed in the electronic medical record should be the one where you would like electronic prescriptions to be sent.  Electronic prescriptions: In compliance with the [place] Strengthen Opioid Misuse Prevention (STOP) Act of 2017 (Session Law 2017-74/H243), effective June 07, 2018, all controlled substances must be electronically prescribed. Calling prescriptions to the pharmacy will cease to exist.  Prescription refills: Only during scheduled appointments. Applies to all prescriptions.  NOTE: The following applies primarily to controlled substances (Opioid* Pain Medications).   Patient's responsibilities: 1. Pain Pills: Bring all pain pills to every appointment (except for procedure appointments). 2. Pill Bottles: Bring pills in original pharmacy bottle. Always bring the newest bottle. Bring bottle, even if empty. 3. Medication refills: You are responsible for knowing and keeping track of what medications you take and those you need refilled. The day before your appointment: write a list of all prescriptions that need to be refilled. The day of the appointment: give the list to the admitting nurse. Prescriptions will be written only during appointments. No prescriptions will be written on procedure days. If you forget a medication: it will not be "Called in", "Faxed", or "electronically sent". You will need to get another appointment to get these prescribed. No early refills. Do not call asking to have your prescription filled  early. 4. Prescription Accuracy: You are responsible for carefully inspecting your prescriptions before leaving our office. Have the discharge nurse carefully go over each prescription with you, before taking them home. Make sure that your name is accurately spelled, that your address is correct. Check the name and dose of your medication to make sure it is accurate. Check the number of pills, and the written instructions to make sure they are clear and accurate. Make sure that you are given enough medication to last until your next medication refill appointment. 5. Taking Medication: Take medication as prescribed. When it comes to controlled substances, taking less pills or less frequently than prescribed is permitted and encouraged. Never take more pills than instructed. Never take medication more frequently than prescribed.  6. Inform other Doctors: Always inform, all of your healthcare providers, of all the medications you take. 7. Pain Medication from other Providers: You are not allowed to accept any additional pain medication from any other Doctor or Healthcare provider. There are two exceptions to this rule. (see below) In the event that you require additional pain medication, you are responsible for notifying us, as stated below. 8. Medication Agreement: You are responsible for carefully reading and following our Medication Agreement. This must be signed before receiving any prescriptions from our practice. Safely store a copy of your signed Agreement. Violations to the Agreement will result in no further prescriptions. (Additional copies of our Medication Agreement are available upon request.) 9. Laws, Rules, & Regulations: All patients are expected to follow all Federal and State Laws, Statutes, Rules, & Regulations. Ignorance of the Laws does not constitute a valid excuse. The use of any illegal substances is prohibited. 10. Adopted CDC guidelines & recommendations: Target dosing levels will be  at or below 60 MME/day. Use of benzodiazepines** is not recommended.  Exceptions: There are only two exceptions to the rule of not   receiving pain medications from other Healthcare Providers. 1. Exception #1 (Emergencies): In the event of an emergency (i.e.: accident requiring emergency care), you are allowed to receive additional pain medication. However, you are responsible for: As soon as you are able, call our office [phone], at any time of the day or night, and leave a message stating your name, the date and nature of the emergency, and the name and dose of the medication prescribed. In the event that your call is answered by a member of our staff, make sure to document and save the date, time, and the name of the person that took your information.  2. Exception #2 (Planned Surgery): In the event that you are scheduled by another doctor or dentist to have any type of surgery or procedure, you are allowed (for a period no longer than 30 days), to receive additional pain medication, for the acute post-op pain. However, in this case, you are responsible for picking up a copy of our "Post-op Pain Management for Surgeons" handout, and giving it to your surgeon or dentist. This document is available at our office, and does not require an appointment to obtain it. Simply go to our office during business hours (Monday-Thursday from 8:00 AM to 4:00 PM) (Friday 8:00 AM to 12:00 Noon) or if you have a scheduled appointment with us, prior to your surgery, and ask for it by name. In addition, you will need to provide us with your name, name of your surgeon, type of surgery, and date of procedure or surgery.  *Opioid medications include: morphine, codeine, oxycodone, oxymorphone, hydrocodone, hydromorphone, meperidine, tramadol, tapentadol, buprenorphine, fentanyl, methadone. **Benzodiazepine medications include: diazepam (Valium), alprazolam (Xanax), clonazepam (Klonopine), lorazepam (Ativan), clorazepate  (Tranxene), chlordiazepoxide (Librium), estazolam (Prosom), oxazepam (Serax), temazepam (Restoril), triazolam (Halcion) (Last updated: 08/04/2017) ____________________________________________________________________________________________    

## 2018-10-01 ENCOUNTER — Other Ambulatory Visit: Payer: Self-pay | Admitting: Family Medicine

## 2018-10-12 ENCOUNTER — Other Ambulatory Visit: Payer: Self-pay

## 2018-10-12 ENCOUNTER — Ambulatory Visit: Payer: Self-pay | Admitting: Hematology and Oncology

## 2018-10-15 ENCOUNTER — Other Ambulatory Visit: Payer: Self-pay | Admitting: Family Medicine

## 2018-10-17 ENCOUNTER — Other Ambulatory Visit: Payer: Self-pay | Admitting: Family Medicine

## 2018-10-26 ENCOUNTER — Other Ambulatory Visit: Payer: Self-pay

## 2018-10-26 ENCOUNTER — Ambulatory Visit (INDEPENDENT_AMBULATORY_CARE_PROVIDER_SITE_OTHER): Payer: 59 | Admitting: Family Medicine

## 2018-10-26 ENCOUNTER — Encounter: Payer: Self-pay | Admitting: Family Medicine

## 2018-10-26 VITALS — BP 120/80 | HR 76 | Temp 98.4°F

## 2018-10-26 DIAGNOSIS — R300 Dysuria: Secondary | ICD-10-CM

## 2018-10-26 DIAGNOSIS — R319 Hematuria, unspecified: Secondary | ICD-10-CM | POA: Diagnosis not present

## 2018-10-26 DIAGNOSIS — M25511 Pain in right shoulder: Secondary | ICD-10-CM

## 2018-10-26 LAB — UA/M W/RFLX CULTURE, ROUTINE
Bilirubin, UA: NEGATIVE
Glucose, UA: NEGATIVE
Ketones, UA: NEGATIVE
Leukocytes,UA: NEGATIVE
Nitrite, UA: NEGATIVE
Protein,UA: NEGATIVE
Specific Gravity, UA: 1.01 (ref 1.005–1.030)
Urobilinogen, Ur: 0.2 mg/dL (ref 0.2–1.0)
pH, UA: 6 (ref 5.0–7.5)

## 2018-10-26 LAB — MICROSCOPIC EXAMINATION
Bacteria, UA: NONE SEEN
Epithelial Cells (non renal): NONE SEEN /hpf (ref 0–10)
WBC, UA: NONE SEEN /hpf (ref 0–5)

## 2018-10-26 MED ORDER — MELOXICAM 15 MG PO TABS: 15.0000 mg | ORAL_TABLET | Freq: Every day | ORAL | 0 refills | Status: DC

## 2018-10-26 NOTE — Progress Notes (Signed)
BP 120/80   Pulse 76   Temp 98.4 F (36.9 C) (Oral)   SpO2 97%    Subjective:    Patient ID: Justin [name], male    DOB: Mar 23, 1966, 52 y.o.   MRN: [id number]  HPI: Justin [name] is a 53 y.o. male  Chief Complaint  Patient presents with  . Urinary Tract Infection    pt states he is still having pain with urination    Dysuria and penile pain that feels like something is stuck. Has been dealing with this for 2 months, started with right flank pain and its been gradually moving downward. Thinks he passed a few tiny black dots the other day but still feels like something is lodged inside his penis. Some intermittent hematuria still. Has been on flomax which hasn't seemed to help much. Denies fevers, chills, abdominal pain, N/V.   RIght shoulder pain x 1.5 months, using heat and ice. Started as a feeling like a crick in his neck but now has moved downward toward shoulder and aches down into the entire right arm. No numbness, tingling, weakness, known injury. Followed by pain clinic and on oxycodone regimen for chronic thoracic pain. Has been taking 2-3 daily per their recommendation during this time.   Relevant past medical, surgical, family and social history reviewed and updated as indicated. Interim medical history since our last visit reviewed. Allergies and medications reviewed and updated.  Review of Systems  Per HPI unless specifically indicated above     Objective:    BP 120/80   Pulse 76   Temp 98.4 F (36.9 C) (Oral)   SpO2 97%   Wt Readings from Last 3 Encounters:  08/15/18 267 lb 1.6 oz (121.2 kg)  07/14/18 266 lb (120.7 kg)  06/29/18 248 lb (112.5 kg)    Physical Exam Vitals signs and nursing note reviewed.  Constitutional:      Appearance: Normal appearance.  HENT:     Head: Atraumatic.  Eyes:     Extraocular Movements: Extraocular movements intact.     Conjunctiva/sclera: Conjunctivae normal.  Neck:     Musculoskeletal: Normal range of motion  and neck supple.  Cardiovascular:     Rate and Rhythm: Normal rate and regular rhythm.     Pulses: Normal pulses.  Pulmonary:     Effort: Pulmonary effort is normal.     Breath sounds: Normal breath sounds.  Abdominal:     General: Bowel sounds are normal.     Palpations: Abdomen is soft.     Tenderness: There is no abdominal tenderness. There is no right CVA tenderness, left CVA tenderness or guarding.  Genitourinary:    Prostate: Normal.  Musculoskeletal: Normal range of motion.        General: Tenderness (mild right trapezius ttp) present. No swelling.  Skin:    General: Skin is warm and dry.  Neurological:     General: No focal deficit present.     Mental Status: He is oriented to person, place, and time.  Psychiatric:        Mood and Affect: Mood normal.        Thought Content: Thought content normal.        Judgment: Judgment normal.     Results for orders placed or performed in visit on 08/15/18  Microscopic Examination  Result Value Ref Range   WBC, UA None seen 0 - 5 /hpf   RBC, UA 3-10 (A) 0 - 2 /hpf   Epithelial Cells (  non renal) 0-10 0 - 10 /hpf   Bacteria, UA None seen None seen/Few  UA/M w/rflx Culture, Routine  Result Value Ref Range   Specific Gravity, UA 1.015 1.005 - 1.030   pH, UA 6.0 5.0 - 7.5   Color, UA Yellow Yellow   Appearance Ur Clear Clear   Leukocytes, UA Negative Negative   Protein, UA Negative Negative/Trace   Glucose, UA Negative Negative   Ketones, UA Negative Negative   RBC, UA 2+ (A) Negative   Bilirubin, UA Negative Negative   Urobilinogen, Ur 0.2 0.2 - 1.0 mg/dL   Nitrite, UA Negative Negative   Microscopic Examination See below:       Assessment & Plan:   Problem List Items Addressed This Visit    None    Visit Diagnoses    Hematuria, unspecified type    -  Primary   Will place urgent referral to urology given worsening pain and probable lodged stone. Continue flomax and good fluid intake   Relevant Orders   UA/M w/rflx  Culture, Routine   Ambulatory referral to Urology   Dysuria       Will place urgent referral to urology given worsening pain and probable lodged stone. Continue flomax and good fluid intake   Acute pain of right shoulder       Lidocaine patches, heat/ice, postural exercises. Meloxicam sent for prn use in addition to tylenol and oxycodone regimen. F/u if not improving       Follow up plan: Return if symptoms worsen or fail to improve.

## 2018-10-31 ENCOUNTER — Telehealth: Payer: Self-pay

## 2018-10-31 NOTE — Telephone Encounter (Signed)
He has been taking more medicine due to having a kidney stone and is going to run out of medicines a few days early. He wants to know if he can pick up his script this month on the 26 instead of the 29.

## 2018-10-31 NOTE — Telephone Encounter (Signed)
Spoke with Justin [name] to let him know that I can't approve any early fills for his pain medications.  I did explain chronic pain vs acute pain and that it is within his contract to be prescribed something more for acute pain if the provider caring for him feels it is necessary.  Also, that we don't want him to take chronic pain meds for acute pain.  Patient verbalizes u/o but was told by his provider that they could not prescribe for him d/t pain contract.  He will be seeing a urologist on Thursday d/t kidney stone.  I told him in the future if something like this happens we will be glad to help him with situations like this.

## 2018-11-02 ENCOUNTER — Ambulatory Visit: Payer: 59 | Admitting: Urology

## 2018-11-02 ENCOUNTER — Encounter: Payer: Self-pay | Admitting: Urology

## 2018-11-02 ENCOUNTER — Other Ambulatory Visit: Payer: Self-pay

## 2018-11-02 VITALS — BP 117/77 | HR 83 | Ht 71.5 in | Wt 265.4 lb

## 2018-11-02 DIAGNOSIS — R319 Hematuria, unspecified: Secondary | ICD-10-CM

## 2018-11-02 DIAGNOSIS — B379 Candidiasis, unspecified: Secondary | ICD-10-CM | POA: Diagnosis not present

## 2018-11-02 LAB — URINALYSIS, COMPLETE
Bilirubin, UA: NEGATIVE
Glucose, UA: NEGATIVE
Ketones, UA: NEGATIVE
Leukocytes,UA: NEGATIVE
Nitrite, UA: NEGATIVE
Protein,UA: NEGATIVE
Specific Gravity, UA: 1.025 (ref 1.005–1.030)
Urobilinogen, Ur: 0.2 mg/dL (ref 0.2–1.0)
pH, UA: 6 (ref 5.0–7.5)

## 2018-11-02 LAB — MICROSCOPIC EXAMINATION

## 2018-11-02 MED ORDER — FLUCONAZOLE 200 MG PO TABS: 200.0000 mg | ORAL_TABLET | Freq: Every day | ORAL | 0 refills | Status: AC

## 2018-11-02 NOTE — Progress Notes (Signed)
11/02/2018 2:03 PM   [name] 03-12-1966 [id number]  Referring provider: Volney American, PA-C [place], [place] [address]  CC: Dysuria  HPI: I saw Mr. Gentile in urology clinic today in consultation for dysuria from [place], [place].  He is a 53 year old male with chronic pain who reportedly approximately 6 weeks ago had flank pain, dysuria, and some mild hematuria.  He felt that he was passing a kidney stone.  He never underwent any cross-sectional imaging for this.  This resolved spontaneously.  He does report some mild persistent dysuria, but his hematuria has completely resolved.  His urinalysis today in clinic is notable for yeast infection, with no microscopic hematuria.  He is a non-smoker.  He previously worked as a Glass blower/designer.  He denies any other carcinogenic exposures.  He denies any asymptomatic gross hematuria.  No aggravating or alleviating factors.  Severity is mild.   PMH: Past Medical History:  Diagnosis Date  . Arm vein blood clot    right  . Coronary artery disease   . DVT (deep venous thrombosis) ([place]) 2012; January 2016   History of right arm; now right lower shotty  . GERD (gastroesophageal reflux disease)   . Rib pain on right side 08/04/2016   RT rib pain that radiates around to the back. Level 8 of 10.  Constant. Started 1 mo ago, 1 week after DVT.  Marland Kitchen Vascular thoracic outlet syndrome    Right     Surgical History: Past Surgical History:  Procedure Laterality Date  . CAROTID ANGIOGRAM    . KNEE CARTILAGE SURGERY     Performed for cracked patella and torn meniscus  . Right upper extremity thrombectomy  2012  . SCALENOTOMY W/O RESECTION CERVICAL RIB  2012   4 right-sided thoracic outlet syndrome    Allergies: No Known Allergies  Family History: Family History  Problem Relation Age of Onset  . Heart disease Father   . Breast cancer Father 67  . Diabetes Father   . Heart attack Father   . Cancer Maternal  Grandmother   . Leukemia Maternal Grandfather   . Prostate cancer Maternal Uncle     Social History:  reports that he has quit smoking. He smoked 0.50 packs per day. He has never used smokeless tobacco. He reports that he does not drink alcohol or use drugs.  ROS: Please see flowsheet from today's date for complete review of systems.  Physical Exam: BP 117/77 (BP Location: Left Arm, Patient Position: Sitting, Cuff Size: Large)   Pulse 83   Ht 5' 11.5" (1.816 m)   Wt 265 lb 6.4 oz (120.4 kg)   BMI 36.50 kg/m    Constitutional:  Alert and oriented, No acute distress. Cardiovascular: No clubbing, cyanosis, or edema. Respiratory: Normal respiratory effort, no increased work of breathing. GI: Abdomen is soft, nontender, nondistended, no abdominal masses GU: No CVA tenderness, phallus without lesions, widely patent meatus Lymph: No cervical or inguinal lymphadenopathy. Skin: No rashes, bruises or suspicious lesions. Neurologic: Grossly intact, no focal deficits, moving all 4 extremities. Psychiatric: Normal mood and affect.  Laboratory Data: Reviewed  Urinalysis today 0-5 WBCs, 0-2 RBCs, few bacteria, yeast present, nitrite negative  Pertinent Imaging: None to review  Assessment & Plan:   In summary, the patient is a 53 year old man with mild hematuria at the time of flank pain and dysuria consistent with likely spontaneously passed kidney stone.  He does not meet criteria for asymptomatic gross hematuria work-up  with CT urogram and cystoscopy.  He has no microscopic hematuria today, however his urine does show yeast consistent with a urinary infection.  We discussed the need of further follow-up or investigation if he developed asymptomatic gross hematuria.  Fluconazole x7 days for yeast infection RTC as needed  [place], [place] [address] [place], [place] [address] [phone]

## 2018-11-02 NOTE — Patient Instructions (Signed)

## 2018-11-13 ENCOUNTER — Telehealth: Payer: Self-pay | Admitting: Hematology and Oncology

## 2018-11-13 ENCOUNTER — Other Ambulatory Visit: Payer: Self-pay

## 2018-11-13 ENCOUNTER — Inpatient Hospital Stay: Payer: 59 | Attending: Hematology and Oncology

## 2018-11-13 DIAGNOSIS — I82511 Chronic embolism and thrombosis of right femoral vein: Secondary | ICD-10-CM | POA: Diagnosis not present

## 2018-11-13 DIAGNOSIS — I82B12 Acute embolism and thrombosis of left subclavian vein: Secondary | ICD-10-CM | POA: Insufficient documentation

## 2018-11-13 DIAGNOSIS — Z791 Long term (current) use of non-steroidal anti-inflammatories (NSAID): Secondary | ICD-10-CM | POA: Diagnosis not present

## 2018-11-13 DIAGNOSIS — R760 Raised antibody titer: Secondary | ICD-10-CM | POA: Diagnosis not present

## 2018-11-13 DIAGNOSIS — Z803 Family history of malignant neoplasm of breast: Secondary | ICD-10-CM | POA: Insufficient documentation

## 2018-11-13 DIAGNOSIS — Z87891 Personal history of nicotine dependence: Secondary | ICD-10-CM | POA: Insufficient documentation

## 2018-11-13 DIAGNOSIS — Z79899 Other long term (current) drug therapy: Secondary | ICD-10-CM | POA: Insufficient documentation

## 2018-11-13 DIAGNOSIS — Z7901 Long term (current) use of anticoagulants: Secondary | ICD-10-CM | POA: Insufficient documentation

## 2018-11-13 LAB — COMPREHENSIVE METABOLIC PANEL
ALT: 43 U/L (ref 0–44)
AST: 28 U/L (ref 15–41)
Albumin: 4.2 g/dL (ref 3.5–5.0)
Alkaline Phosphatase: 76 U/L (ref 38–126)
Anion gap: 6 (ref 5–15)
BUN: 13 mg/dL (ref 6–20)
CO2: 24 mmol/L (ref 22–32)
Calcium: 8.9 mg/dL (ref 8.9–10.3)
Chloride: 105 mmol/L (ref 98–111)
Creatinine, Ser: 1.29 mg/dL — ABNORMAL HIGH (ref 0.61–1.24)
GFR calc Af Amer: >60 mL/min (ref >60)
GFR calc non Af Amer: >60 mL/min (ref >60)
Glucose, Bld: 134 mg/dL — ABNORMAL HIGH (ref 70–99)
Potassium: 3.9 mmol/L (ref 3.5–5.1)
Sodium: 135 mmol/L (ref 135–145)
Total Bilirubin: 0.5 mg/dL (ref 0.3–1.2)
Total Protein: 8 g/dL (ref 6.5–8.1)

## 2018-11-13 LAB — CBC WITH DIFFERENTIAL/PLATELET
Abs Immature Granulocytes: 0.02 10*3/uL (ref 0.00–0.07)
Basophils Absolute: 0 10*3/uL (ref 0.0–0.1)
Basophils Relative: 1 %
Eosinophils Absolute: 0.3 10*3/uL (ref 0.0–0.5)
Eosinophils Relative: 4 %
HCT: 45.8 % (ref 39.0–52.0)
Hemoglobin: 15.1 g/dL (ref 13.0–17.0)
Immature Granulocytes: 0 %
Lymphocytes Relative: 34 %
Lymphs Abs: 2.3 10*3/uL (ref 0.7–4.0)
MCH: 30.8 pg (ref 26.0–34.0)
MCHC: 33 g/dL (ref 30.0–36.0)
MCV: 93.3 fL (ref 80.0–100.0)
Monocytes Absolute: 0.7 10*3/uL (ref 0.1–1.0)
Monocytes Relative: 10 %
Neutro Abs: 3.4 10*3/uL (ref 1.7–7.7)
Neutrophils Relative %: 51 %
Platelets: 255 10*3/uL (ref 150–400)
RBC: 4.91 MIL/uL (ref 4.22–5.81)
RDW: 13 % (ref 11.5–15.5)
WBC: 6.8 10*3/uL (ref 4.0–10.5)
nRBC: 0 % (ref 0.0–0.2)

## 2018-11-13 NOTE — Progress Notes (Signed)
[facility]  [address] [place], [place] [address] Phone: [phone]  Fax: [phone]   Telemedicine Office Visit:  11/14/2018  Referring physician: Valerie Roys, DO  I connected with Justin [name] on 11/14/2018 at 12:51 PM by videoconferencing and verified that I was speaking with the correct person using 2 identifiers.  The patient was at home.  I discussed the limitations, risk, security and privacy concerns of performing an evaluation and management service by videoconferencing and the availability of in person appointments.  I also discussed with the patient that there may be a patient responsible charge related to this service.  The patient expressed understanding and agreed to proceed.   Chief Complaint: Justin [name] is a 53 y.o. male with recurrent thrombosis who is seen for 4 month assessment on Lovenox.    HPI: The patient was last seen in the hematology clinic on 07/11/2018. At that time, he continued to have right rib pain. Exam was stable. Hemoglobin was 14.4. Creatinine was 1.27.  He was seen by his PCP, Merrie Roof, PA-C, on 08/15/2018 for hematuria. Kidney stone was suspected due to urinalysis positive for RBCs. He was placed on Flomax. He was again seen on 10/26/2018 and given an urgent referral to Urology due to worsening pain.   He was seen by Dionisio David, NP for management of chronic pain and continued on oxycodone.   He was seen in Urology on 11/02/2018 by Dr. Nickolas Madrid. Urine was negative for blood and positive for yeast consistent with a urinary infection. He did not meet the criteria for a work-up.  He was given fluconazole for 7 days for a yeast infection. He was to return as needed.   Labs followed: 07/14/2018: WBC 7,200, hemoglobin 14.4, hematocrit 44.8, platelets 303,000. Creatinine 1.27.  11/13/2018: WBC 6,800, hemoglobin 15.1, hematocrit 45.8, platelets 255,000. Creatinine 1.29.   During the interim, the  patient is doing "okay." He reports having chronic pain around the base of his neck, radiating down his right shoulder and arm. He is still having right rib pain following after surgery, which he attributes to inflammation.  He reports having to sleep at a 45 degree angle. He denies any hematuria, now that he has passed his kidney stone.   He continues on Lovenox and denies any excess bruising or bleeding.     Past Medical History:  Diagnosis Date  . Arm vein blood clot    right  . Coronary artery disease   . DVT (deep venous thrombosis) ([place]) 2012; January 2016   History of right arm; now right lower shotty  . GERD (gastroesophageal reflux disease)   . Rib pain on right side 08/04/2016   RT rib pain that radiates around to the back. Level 8 of 10.  Constant. Started 1 mo ago, 1 week after DVT.  Marland Kitchen Vascular thoracic outlet syndrome    Right     Past Surgical History:  Procedure Laterality Date  . CAROTID ANGIOGRAM    . KNEE CARTILAGE SURGERY     Performed for cracked patella and torn meniscus  . Right upper extremity thrombectomy  2012  . SCALENOTOMY W/O RESECTION CERVICAL RIB  2012   4 right-sided thoracic outlet syndrome    Family History  Problem Relation Age of Onset  . Heart disease Father   . Breast cancer Father 83  . Diabetes Father   . Heart attack Father   . Cancer Maternal Grandmother   . Leukemia Maternal  Grandfather   . Prostate cancer Maternal Uncle     Social History:  reports that he has quit smoking. He smoked 0.50 packs per day. He has never used smokeless tobacco. He reports that he does not drink alcohol or use drugs. He previously smoked 1/2 pack a day for 10 years.  He stopped smoking in his 26s.  He lives in [place].  He is a Glass blower/designer.  He denies any exposure to radiation or toxins.  He has a son.  The patient's wife is [name].  He is alone today.  Participants in the patient's visit and their role in the encounter included the patient, and  Vito Berger, CMA, today.  The intake visit was provided by Vito Berger, CMA.  Allergies: No Known Allergies  Current Medications: Current Outpatient Medications  Medication Sig Dispense Refill  . atorvastatin (LIPITOR) 10 MG tablet Take 1 tablet (10 mg total) by mouth daily. 90 tablet 1  . Calcium Carbonate-Vit D-Min (GNP CALCIUM 1200) 1200-1000 MG-UNIT CHEW Chew 1,200 mg by mouth daily with breakfast. Take in combination with vitamin D and magnesium. 30 tablet 5  . enoxaparin (LOVENOX) 150 MG/ML injection INJECT 0.74 MLS (110 MG TOTAL) INTO THE SKIN 2 (TWO) TIMES DAILY 60 Syringe 0  . gabapentin (NEURONTIN) 300 MG capsule Take 1 capsule (300 mg total) by mouth 3 (three) times daily. Take 1 tab TID, can take an extra tab at bedtime (Patient taking differently: Take 300 mg by mouth 3 (three) times daily. Take 1 tab TID, can take an extra tab at bedtime) 120 capsule 1  . meloxicam (MOBIC) 15 MG tablet Take 1 tablet (15 mg total) by mouth daily. 30 tablet 0  . [START ON 12/02/2018] oxyCODONE (OXY IR/ROXICODONE) 5 MG immediate release tablet Take 1 tablet (5 mg total) by mouth every 8 (eight) hours as needed for up to 30 days for severe pain. 90 tablet 0  . tamsulosin (FLOMAX) 0.4 MG CAPS capsule TAKE 1 CAPSULE BY MOUTH EVERY DAY 90 capsule 1  . acetaminophen (TYLENOL) 500 MG tablet Take 1,000 mg by mouth every 6 (six) hours as needed.    Marland Kitchen ibuprofen (ADVIL,MOTRIN) 800 MG tablet Take 800 mg by mouth every 8 (eight) hours as needed.    Marland Kitchen oxyCODONE (OXY IR/ROXICODONE) 5 MG immediate release tablet Take 1 tablet (5 mg total) by mouth every 8 (eight) hours as needed for up to 30 days for severe pain. 90 tablet 0  . oxyCODONE (OXY IR/ROXICODONE) 5 MG immediate release tablet Take 1 tablet (5 mg total) by mouth every 8 (eight) hours as needed for up to 30 days for severe pain. 90 tablet 0   No current facility-administered medications for this visit.     Review of Systems  Constitutional:  Negative.  Negative for chills, diaphoresis, fever, malaise/fatigue and weight loss.       Feels "ok".  HENT: Negative.  Negative for congestion, ear pain, nosebleeds, sinus pain and sore throat.   Eyes: Negative.  Negative for blurred vision, double vision and photophobia.  Respiratory: Negative.  Negative for hemoptysis, sputum production and shortness of breath.   Cardiovascular: Negative.  Negative for leg swelling and PND.  Gastrointestinal: Negative.  Negative for abdominal pain, blood in stool, constipation, diarrhea, heartburn, melena, nausea and vomiting.  Genitourinary: Negative.  Negative for dysuria, frequency, hematuria and urgency.  Musculoskeletal: Positive for joint pain (LEFT hip), myalgias (right shoulder and arm) and neck pain (base). Negative for back pain and falls.  RIGHT lateral chest wall pain, chronic.  Skin: Negative.  Negative for itching and rash.  Neurological: Negative.  Negative for dizziness, tremors, focal weakness, weakness and headaches.  Endo/Heme/Allergies: Bruises/bleeds easily (on enoxaparin injections).  Psychiatric/Behavioral: Negative.  Negative for depression and memory loss. The patient is not nervous/anxious and does not have insomnia.   All other systems reviewed and are negative.   Performance status (ECOG): 1  Physical Exam  Constitutional: He is oriented to person, place, and time. He appears well-developed and well-nourished. No distress.  HENT:  Head: Normocephalic and atraumatic.  Wearing a light blue [facility] cap.  Short graying hair.   Eyes: Conjunctivae and EOM are normal. No scleral icterus.  Glasses. Brown eyes.  Neurological: He is alert and oriented to person, place, and time.  Skin: He is not diaphoretic.  Psychiatric: He has a normal mood and affect. His behavior is normal. Judgment and thought content normal.  Nursing note reviewed.   Appointment on 11/13/2018  Component Date Value Ref Range Status  . Sodium 11/13/2018 135   135 - 145 mmol/L Final  . Potassium 11/13/2018 3.9  3.5 - 5.1 mmol/L Final  . Chloride 11/13/2018 105  98 - 111 mmol/L Final  . CO2 11/13/2018 24  22 - 32 mmol/L Final  . Glucose, Bld 11/13/2018 134* 70 - 99 mg/dL Final  . BUN 11/13/2018 13  6 - 20 mg/dL Final  . Creatinine, Ser 11/13/2018 1.29* 0.61 - 1.24 mg/dL Final  . Calcium 11/13/2018 8.9  8.9 - 10.3 mg/dL Final  . Total Protein 11/13/2018 8.0  6.5 - 8.1 g/dL Final  . Albumin 11/13/2018 4.2  3.5 - 5.0 g/dL Final  . AST 11/13/2018 28  15 - 41 U/L Final  . ALT 11/13/2018 43  0 - 44 U/L Final  . Alkaline Phosphatase 11/13/2018 76  38 - 126 U/L Final  . Total Bilirubin 11/13/2018 0.5  0.3 - 1.2 mg/dL Final  . GFR calc non Af Amer 11/13/2018 >60  >60 mL/min Final  . GFR calc Af Amer 11/13/2018 >60  >60 mL/min Final  . Anion gap 11/13/2018 6  5 - 15 Final   Performed at [facility], [address] [place], [place] [address]  . WBC 11/13/2018 6.8  4.0 - 10.5 K/uL Final  . RBC 11/13/2018 4.91  4.22 - 5.81 MIL/uL Final  . Hemoglobin 11/13/2018 15.1  13.0 - 17.0 g/dL Final  . HCT 11/13/2018 45.8  39.0 - 52.0 % Final  . MCV 11/13/2018 93.3  80.0 - 100.0 fL Final  . MCH 11/13/2018 30.8  26.0 - 34.0 pg Final  . MCHC 11/13/2018 33.0  30.0 - 36.0 g/dL Final  . RDW 11/13/2018 13.0  11.5 - 15.5 % Final  . Platelets 11/13/2018 255  150 - 400 K/uL Final  . nRBC 11/13/2018 0.0  0.0 - 0.2 % Final  . Neutrophils Relative % 11/13/2018 51  % Final  . Neutro Abs 11/13/2018 3.4  1.7 - 7.7 K/uL Final  . Lymphocytes Relative 11/13/2018 34  % Final  . Lymphs Abs 11/13/2018 2.3  0.7 - 4.0 K/uL Final  . Monocytes Relative 11/13/2018 10  % Final  . Monocytes Absolute 11/13/2018 0.7  0.1 - 1.0 K/uL Final  . Eosinophils Relative 11/13/2018 4  % Final  . Eosinophils Absolute 11/13/2018 0.3  0.0 - 0.5 K/uL Final  . Basophils Relative 11/13/2018 1  % Final  . Basophils Absolute 11/13/2018 0.0  0.0 - 0.1 K/uL Final  .  Immature Granulocytes  11/13/2018 0  % Final  . Abs Immature Granulocytes 11/13/2018 0.02  0.00 - 0.07 K/uL Final   Performed at [facility], [address], [place], [place] [address]    Assessment:  Justin [name] is a 53 y.o. male with recurrent thrombosis x 4.  He developed a right upper extremity clot in 2010. He underwent thrombolysis.  He was on Coumadin x 6 months.  He developed a recurrent clot in the right upper extremity in 2011.  He underwent thoracic outlet decompression.  He was on Coumadin for 6-9 months.  He developed a right lower extremity DVT in 2016.  He was started on Xarelto.  He developed a left upper extremity DVT on 06/07/2016 while on Xarelto.  Duplex revealed a near occlusive DVT in the lateral aspect of the left subclavian vein and nonocclusive thrombus extending into the adjacent axillary vein.  He was started on Lovenox.  Hypercoagulable work-up on 06/29/2016 revealed the following normal studies:  CBC with diff, Factor V Leiden, prothrombin gene mutation, protein C antigen and activity, protein S antigen and activity, ATIII antigen and activity, anticardiolipin antibodies and beta-2 glycoprotein antibodies.  Lupus anticoagulant panel was positive on Xarelto and Lovenox.  Chest CT angiogram on 07/02/2016 revealed no evidence of pulmonary embolism.  There were small pulmonary nodules, largest 3 mm, of unclear significance.  Bone scan on 08/04/2016 revealed a focus of moderately increased uptake at approximately the T9 right paravertebral region. Chest CT on 07/20/2016 revealed a large right sided osteophyte at the T9-T10 level which was felt to explain the increased right paravertebral uptake uptake.  There  was also mildly increased uptake in the body of L2. Review of the lumbar spine on the abdominal CT scan of the same date revealed no definite radiographic abnormality in the body of L2 to explain the mildly increased uptake.  There was no abnormal uptake within the ribs or  elsewhere within the skeleton.  Thoracic spine MRI on 09/14/2016 revealed mild degenerative disc disease in the thoracic spine with non-compressive disc bulges at T5-6 and below.  There was no disc herniation or compressive stenosis of the canal or foramina.  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 On the right at T9-10, there was fairly bulky osteophytes emanating from the right costovertebral articulation. There was no evidence of neural compression secondary to this or the other bony degenerative changes.  There was ordinary mild facet osteoarthritis without edematous change or significant hypertrophy.  He underwent right lateral mini-thoracotomy for resection of a bony rib lesion on 01/14/2017.  Notes indicate he underwent thoracic discectomy with decompression by anterior approach.  Pathology revealed an "exophytic lesion".  He has had right sided rib pain post-operatively.  Bone density on 12/06/2017 revealed a normal T score of -0.6 in the right femoral neck.  Symptomatically, he is doing well.  He has chronic rib pain following his surgery.  Plan: 1. Review labs from 11/13/2018. 2. Recurrent thrombosis Clinically, he continues to do well. He continues Lovenox q 12 hours. Repeat lupus anticoagulant work-up with next blood draw. Follow-up bone density in 12/2019 (2 years from baseline) secondary to risk of osteopenia/osteoporosis with long term Lovenox use. 3.   RTC in 3 months for MD assessment and labs (CBC with diff, CMP, lupus anticoagulant, anticardiolipin antibodies, beta2-glycoprotein antibodies).   I discussed the assessment and treatment plan with the patient.  The patient was  provided an opportunity to ask questions and all were answered.  The patient agreed with the plan and demonstrated an understanding of the instructions.  The patient was advised to call back or seek an in person evaluation if the  symptoms worsen or if the condition fails to improve as anticipated.  I provided 16 minutes (12:51 PM - 1:07 PM) of face-to-face video visit time during this this encounter and > 50% was spent counseling as documented under my assessment and plan.  I provided these services from the [facility] office.   Nolon Stalls, MD, PhD  11/14/2018, 12:51 PM  I, Molly Dorshimer, am acting as Education administrator for [employer]. Mike Gip, MD, PhD.  I, Melissa C. Mike Gip, MD, have reviewed the above documentation for accuracy and completeness, and I agree with the above.

## 2018-11-14 ENCOUNTER — Inpatient Hospital Stay (HOSPITAL_BASED_OUTPATIENT_CLINIC_OR_DEPARTMENT_OTHER): Payer: 59 | Admitting: Hematology and Oncology

## 2018-11-14 ENCOUNTER — Other Ambulatory Visit: Payer: 59

## 2018-11-14 ENCOUNTER — Encounter: Payer: Self-pay | Admitting: Hematology and Oncology

## 2018-11-14 DIAGNOSIS — I82511 Chronic embolism and thrombosis of right femoral vein: Secondary | ICD-10-CM | POA: Diagnosis not present

## 2018-11-14 DIAGNOSIS — Z7901 Long term (current) use of anticoagulants: Secondary | ICD-10-CM | POA: Diagnosis not present

## 2018-11-14 DIAGNOSIS — I82B12 Acute embolism and thrombosis of left subclavian vein: Secondary | ICD-10-CM | POA: Diagnosis not present

## 2018-11-14 DIAGNOSIS — R760 Raised antibody titer: Secondary | ICD-10-CM

## 2018-11-14 NOTE — Progress Notes (Signed)
The patient c/o increased pain noted to his lower back (pain level today 7). The patient name and DOB has been verified by phone today.

## 2018-12-11 ENCOUNTER — Encounter: Payer: 59 | Admitting: Family Medicine

## 2018-12-21 ENCOUNTER — Encounter: Payer: Self-pay | Admitting: Pain Medicine

## 2018-12-24 NOTE — Progress Notes (Signed)
Pain Management Virtual Encounter Note - Virtual Visit via Telephone Telehealth (real-time audio visits between healthcare provider and patient).   Patient's Phone No. & Preferred Pharmacy:  [phone] (home); [phone] (mobile); (Preferred) [phone] [email or web address]  [id number] [address], [address] [place] [address] Phone: [phone] Fax: [phone]    Pre-screening note:  Our staff contacted Justin [name] and offered him an "in person", "face-to-face" appointment versus a telephone encounter. He indicated preferring the telephone encounter, at this time.   Reason for Virtual Visit: COVID-19*  Social distancing based on CDC and AMA recommendations.   I contacted Justin [name] on 12/25/2018 via telephone.      I clearly identified myself as Gaspar Cola, MD. I verified that I was speaking with the correct person using two identifiers (Name: Justin [name], and date of birth: [birth year]).  Advanced Informed Consent I sought verbal advanced consent from Justin [name] for virtual visit interactions. I informed Justin [name] of possible security and privacy concerns, risks, and limitations associated with providing "not-in-person" medical evaluation and management services. I also informed Justin [name] of the availability of "in-person" appointments. Finally, I informed him that there would be a charge for the virtual visit and that he could be  personally, fully or partially, financially responsible for it. Justin [name] expressed understanding and agreed to proceed.   Historic Elements   Justin [name] is a 53 y.o. year old, male patient evaluated today after his last encounter by our practice on 10/31/2018. Justin [name]  has a past medical history of Arm vein blood clot, Coronary artery disease, DVT (deep venous thrombosis) ([place]) (2012; January 2016), GERD  (gastroesophageal reflux disease), Rib pain on right side (08/04/2016), and Vascular thoracic outlet syndrome. He also  has a past surgical history that includes Knee cartilage surgery; Carotid angiogram; Scalenotomy w/o resection cervical rib (2012); and Right upper extremity thrombectomy (2012). Justin [name] has a current medication list which includes the following prescription(s): acetaminophen, atorvastatin, gnp calcium 1200, enoxaparin, gabapentin, ibuprofen, oxycodone, oxycodone, oxycodone, and tamsulosin. He  reports that he has quit smoking. He smoked 0.50 packs per day. He has never used smokeless tobacco. He reports that he does not drink alcohol or use drugs. Justin [name] has No Known Allergies.   HPI  Today, he is being contacted for medication management.  Pharmacotherapy Assessment  Analgesic: OxycodoneIR5 mg,1 tab PO q 8 hrs(15mg /day of oxycodone) MME/day:22.5mg /day.   Monitoring: Pharmacotherapy: No side-effects or adverse reactions reported. [place] PMP: PDMP reviewed during this encounter.       Compliance: No problems identified. Effectiveness: Clinically acceptable. Plan: Refer to "POC".  Pertinent Labs   SAFETY SCREENING Profile No results found for: SARSCOV2NAA, COVIDSOURCE, STAPHAUREUS, MRSAPCR, HCVAB, HIV, PREGTESTUR Renal Function Lab Results  Component Value Date   BUN 13 11/13/2018   CREATININE 1.29 (H) 11/13/2018   BCR 11 06/29/2018   GFRAA >60 11/13/2018   GFRNONAA >60 11/13/2018   Hepatic Function Lab Results  Component Value Date   AST 28 11/13/2018   ALT 43 11/13/2018   ALBUMIN 4.2 11/13/2018   UDS Summary  Date Value Ref Range Status  06/29/2018 FINAL  Final    Comment:    ==================================================================== TOXASSURE SELECT 13 (MW) ==================================================================== Test  Result       Flag       Units Drug Present and Declared for Prescription  Verification   Oxycodone                      1316         EXPECTED   ng/mg creat   Oxymorphone                    681          EXPECTED   ng/mg creat   Noroxycodone                   942          EXPECTED   ng/mg creat   Noroxymorphone                 98           EXPECTED   ng/mg creat    Sources of oxycodone are scheduled prescription medications.    Oxymorphone, noroxycodone, and noroxymorphone are expected    metabolites of oxycodone. Oxymorphone is also available as a    scheduled prescription medication. ==================================================================== Test                      Result    Flag   Units      Ref Range   Creatinine              345              mg/dL      >=20 ==================================================================== Declared Medications:  The flagging and interpretation on this report are based on the  following declared medications.  Unexpected results may arise from  inaccuracies in the declared medications.  **Note: The testing scope of this panel includes these medications:  Oxycodone  **Note: The testing scope of this panel does not include following  reported medications:  Acetaminophen (Tylenol)  Atorvastatin (Lipitor)  Calcium  Enoxaparin (Lovenox)  Gabapentin (Neurontin)  Ibuprofen ==================================================================== For clinical consultation, please call [phone]. ====================================================================    Note: Above Lab results reviewed.  Recent imaging  US Venous Img Lower Unilateral Right CLINICAL DATA:  Right leg pain and swelling for 3 days  EXAM: RIGHT LOWER EXTREMITY VENOUS DOPPLER ULTRASOUND  TECHNIQUE: Gray-scale sonography with graded compression, as well as color Doppler and duplex ultrasound were performed to evaluate the lower extremity deep venous systems from the level of the common femoral vein and including the common femoral,  femoral, profunda femoral, popliteal and calf veins including the posterior tibial, peroneal and gastrocnemius veins when visible. The superficial great saphenous vein was also interrogated. Spectral Doppler was utilized to evaluate flow at rest and with distal augmentation maneuvers in the common femoral, femoral and popliteal veins.  COMPARISON:  None.  FINDINGS: Contralateral Common Femoral Vein: Respiratory phasicity is normal and symmetric with the symptomatic side. No evidence of thrombus. Normal compressibility.  Common Femoral Vein: No evidence of thrombus. Normal compressibility, respiratory phasicity and response to augmentation.  Saphenofemoral Junction: No evidence of thrombus. Normal compressibility and flow on color Doppler imaging.  Profunda Femoral Vein: No evidence of thrombus. Normal compressibility and flow on color Doppler imaging.  Femoral Vein: No evidence of thrombus. Normal compressibility, respiratory phasicity and response to augmentation.  Popliteal Vein: No evidence of thrombus. Normal compressibility, respiratory phasicity and response to augmentation.  Calf Veins: No evidence of thrombus. Normal compressibility  and flow on color Doppler imaging.  Superficial Great Saphenous Vein: No evidence of thrombus. Normal compressibility.  Venous Reflux:  None.  Other Findings:  None.  IMPRESSION: No evidence of deep venous thrombosis.  Electronically Signed   By: Inez Catalina M.D.   On: 06/29/2018 16:30  Assessment  The primary encounter diagnosis was Chronic pain syndrome. Diagnoses of Chronic thoracic back pain (Primary Area of Pain) (Right), Thoracic radiculitis (Secondary Area of Pain), Right upper quadrant abdominal pain (Tertiary Area of Pain), Rib pain on right side (Fourth Area of Pain), and Vitamin D deficiency were also pertinent to this visit.  Plan of Care  I have discontinued Justin [name]. Justin [name]'s oxyCODONE, oxyCODONE, and meloxicam. I  have also changed his oxyCODONE. Additionally, I am having him start on oxyCODONE and oxyCODONE. Lastly, I am having him maintain his gabapentin, atorvastatin, acetaminophen, ibuprofen, enoxaparin, tamsulosin, and GNP Calcium 1200.  Pharmacotherapy (Medications Ordered): Meds ordered this encounter  Medications  . Calcium Carbonate-Vit D-Min (GNP CALCIUM 1200) 1200-1000 MG-UNIT CHEW    Sig: Chew 1,200 mg by mouth daily with breakfast. Take in combination with vitamin D and magnesium.    Dispense:  90 tablet    Refill:  2    Fill one day early if pharmacy is closed on scheduled refill date. May substitute for generic if available.  Marland Kitchen oxyCODONE (OXY IR/ROXICODONE) 5 MG immediate release tablet    Sig: Take 1 tablet (5 mg total) by mouth every 8 (eight) hours as needed for severe pain. Must last 30 days    Dispense:  90 tablet    Refill:  0    Chronic Pain: STOP Act (Not applicable) Fill 1 day early if closed on refill date. Do not fill until: 01/01/2019. To last until: 01/31/2019. Avoid benzodiazepines within 8 hours of opioids  . oxyCODONE (OXY IR/ROXICODONE) 5 MG immediate release tablet    Sig: Take 1 tablet (5 mg total) by mouth every 8 (eight) hours as needed for severe pain. Must last 30 days    Dispense:  90 tablet    Refill:  0    Chronic Pain: STOP Act (Not applicable) Fill 1 day early if closed on refill date. Do not fill until: 01/31/2019. To last until: 03/02/2019. Avoid benzodiazepines within 8 hours of opioids  . oxyCODONE (OXY IR/ROXICODONE) 5 MG immediate release tablet    Sig: Take 1 tablet (5 mg total) by mouth every 8 (eight) hours as needed for severe pain. Must last 30 days    Dispense:  90 tablet    Refill:  0    Chronic Pain: STOP Act (Not applicable) Fill 1 day early if closed on refill date. Do not fill until: 03/02/2019. To last until: 04/01/2019. Avoid benzodiazepines within 8 hours of opioids   Orders:  No orders of the defined types were placed in this  encounter.  Follow-up plan:   No follow-ups on file.      Interventional management options:  Considering:   Diagnostic right-sided thoracicSNRB Diagnosticright-sided thoracicfacetmedial nerveblock Possible right sided thoracic facet medial branch RFA   Palliative PRN treatment(s):   Palliative right T6, T7, T8, &T9intercostal nerve block  Palliative right T6, T7, T8, &T9intercostal nerve RFA #2 (Last done 12/29/17)    Recent Visits Date Type Provider Dept  09/26/18 Office Visit Vevelyn Francois, NP [facility]  Showing recent visits within past 90 days and meeting all other requirements   Today's Visits Date Type Provider Dept  12/25/18 Office Visit  Milinda Pointer, MD [facility]  Showing today's visits and meeting all other requirements   Future Appointments No visits were found meeting these conditions.  Showing future appointments within next 90 days and meeting all other requirements   I discussed the assessment and treatment plan with the patient. The patient was provided an opportunity to ask questions and all were answered. The patient agreed with the plan and demonstrated an understanding of the instructions.  Patient advised to call back or seek an in-person evaluation if the symptoms or condition worsens.  Total duration of non-face-to-face encounter: 12 minutes.  Note by: Gaspar Cola, MD Date: 12/25/2018; Time: 10:15 AM  Note: This dictation was prepared with Dragon dictation. Any transcriptional errors that may result from this process are unintentional.  Disclaimer:  * Given the special circumstances of the COVID-19 pandemic, the federal government has announced that the Office for Civil Rights (OCR) will exercise its enforcement discretion and will not impose penalties on physicians using telehealth in the event of noncompliance with regulatory requirements under the [place]  (HIPAA) in connection with the good faith provision of telehealth during the OZDGU-44 national public health emergency. ([place])

## 2018-12-25 ENCOUNTER — Ambulatory Visit: Payer: 59 | Attending: Nurse Practitioner | Admitting: Pain Medicine

## 2018-12-25 ENCOUNTER — Other Ambulatory Visit: Payer: Self-pay

## 2018-12-25 DIAGNOSIS — R1011 Right upper quadrant pain: Secondary | ICD-10-CM | POA: Diagnosis not present

## 2018-12-25 DIAGNOSIS — M546 Pain in thoracic spine: Secondary | ICD-10-CM | POA: Diagnosis not present

## 2018-12-25 DIAGNOSIS — G8929 Other chronic pain: Secondary | ICD-10-CM

## 2018-12-25 DIAGNOSIS — M5414 Radiculopathy, thoracic region: Secondary | ICD-10-CM | POA: Diagnosis not present

## 2018-12-25 DIAGNOSIS — G894 Chronic pain syndrome: Secondary | ICD-10-CM

## 2018-12-25 DIAGNOSIS — E559 Vitamin D deficiency, unspecified: Secondary | ICD-10-CM

## 2018-12-25 DIAGNOSIS — R0781 Pleurodynia: Secondary | ICD-10-CM

## 2018-12-25 MED ORDER — GNP CALCIUM 1200 1200-1000 MG-UNIT PO CHEW: 1200.0000 mg | CHEWABLE_TABLET | Freq: Every day | ORAL | 2 refills | Status: DC

## 2018-12-25 MED ORDER — OXYCODONE HCL 5 MG PO TABS: 5.0000 mg | ORAL_TABLET | Freq: Three times a day (TID) | ORAL | 0 refills | Status: DC | PRN

## 2018-12-25 NOTE — Patient Instructions (Signed)
____________________________________________________________________________________________  Medication Recommendations and Reminders  Applies to: All patients receiving prescriptions (written and/or electronic).  Medication Rules & Regulations: These rules and regulations exist for your safety and that of others. They are not flexible and neither are we. Dismissing or ignoring them will be considered "non-compliance" with medication therapy, resulting in complete and irreversible termination of such therapy. (See document titled "Medication Rules" for more details.) In all conscience, because of safety reasons, we cannot continue providing a therapy where the patient does not follow instructions.  Pharmacy of record:   Definition: This is the pharmacy where your electronic prescriptions will be sent.   We do not endorse any particular pharmacy.  You are not restricted in your choice of pharmacy.  The pharmacy listed in the electronic medical record should be the one where you want electronic prescriptions to be sent.  If you choose to change pharmacy, simply notify our nursing staff of your choice of new pharmacy.  Recommendations:  Keep all of your pain medications in a safe place, under lock and key, even if you live alone.   After you fill your prescription, take 1 week's worth of pills and put them away in a safe place. You should keep a separate, properly labeled bottle for this purpose. The remainder should be kept in the original bottle. Use this as your primary supply, until it runs out. Once it's gone, then you know that you have 1 week's worth of medicine, and it is time to come in for a prescription refill. If you do this correctly, it is unlikely that you will ever run out of medicine.  To make sure that the above recommendation works, it is very important that you make sure your medication refill appointments are scheduled at least 1 week before you run out of medicine. To do  this in an effective manner, make sure that you do not leave the office without scheduling your next medication management appointment. Always ask the nursing staff to show you in your prescription , when your medication will be running out. Then arrange for the receptionist to get you a return appointment, at least 7 days before you run out of medicine. Do not wait until you have 1 or 2 pills left, to come in. This is very poor planning and does not take into consideration that we may need to cancel appointments due to bad weather, sickness, or emergencies affecting our staff.  "Partial Fill": If for any reason your pharmacy does not have enough pills/tablets to completely fill or refill your prescription, do not allow for a "partial fill". You will need a separate prescription to fill the remaining amount, which we will not provide. If the reason for the partial fill is your insurance, you will need to talk to the pharmacist about payment alternatives for the remaining tablets, but again, do not accept a partial fill.  Prescription refills and/or changes in medication(s):   Prescription refills, and/or changes in dose or medication, will be conducted only during scheduled medication management appointments. (Applies to both, written and electronic prescriptions.)  No refills on procedure days. No medication will be changed or started on procedure days. No changes, adjustments, and/or refills will be conducted on a procedure day. Doing so will interfere with the diagnostic portion of the procedure.  No phone refills. No medications will be "called into the pharmacy".  No Fax refills.  No weekend refills.  No Holliday refills.  No after hours refills.  Remember:  Business   hours are:  Monday to Thursday 8:00 AM to 4:00 PM Provider's Schedule: Crystal King, NP - Appointments are:  Medication management: Monday to Thursday 8:00 AM to 4:00 PM Hakeem Frazzini, MD - Appointments are:   Medication management: Monday and Wednesday 8:00 AM to 4:00 PM Procedure day: Tuesday and Thursday 7:30 AM to 4:00 PM Bilal Lateef, MD - Appointments are:  Medication management: Tuesday and Thursday 8:00 AM to 4:00 PM Procedure day: Monday and Wednesday 7:30 AM to 4:00 PM (Last update: 08/04/2017) ____________________________________________________________________________________________    

## 2018-12-26 ENCOUNTER — Encounter: Payer: Self-pay | Admitting: Nurse Practitioner

## 2018-12-30 ENCOUNTER — Other Ambulatory Visit: Payer: Self-pay | Admitting: Hematology and Oncology

## 2018-12-30 DIAGNOSIS — R918 Other nonspecific abnormal finding of lung field: Secondary | ICD-10-CM

## 2018-12-30 DIAGNOSIS — M898X1 Other specified disorders of bone, shoulder: Secondary | ICD-10-CM

## 2018-12-30 DIAGNOSIS — R760 Raised antibody titer: Secondary | ICD-10-CM

## 2018-12-30 DIAGNOSIS — I82B12 Acute embolism and thrombosis of left subclavian vein: Secondary | ICD-10-CM

## 2019-01-03 ENCOUNTER — Other Ambulatory Visit: Payer: Self-pay

## 2019-01-03 ENCOUNTER — Ambulatory Visit (INDEPENDENT_AMBULATORY_CARE_PROVIDER_SITE_OTHER): Payer: 59 | Admitting: Family Medicine

## 2019-01-03 ENCOUNTER — Encounter: Payer: Self-pay | Admitting: Family Medicine

## 2019-01-03 VITALS — BP 112/74 | HR 82 | Temp 98.5°F | Ht 71.0 in | Wt 263.4 lb

## 2019-01-03 DIAGNOSIS — I82511 Chronic embolism and thrombosis of right femoral vein: Secondary | ICD-10-CM | POA: Diagnosis not present

## 2019-01-03 DIAGNOSIS — Z0000 Encounter for general adult medical examination without abnormal findings: Secondary | ICD-10-CM

## 2019-01-03 DIAGNOSIS — G8912 Acute post-thoracotomy pain: Secondary | ICD-10-CM | POA: Diagnosis not present

## 2019-01-03 DIAGNOSIS — E782 Mixed hyperlipidemia: Secondary | ICD-10-CM

## 2019-01-03 LAB — UA/M W/RFLX CULTURE, ROUTINE
Bilirubin, UA: NEGATIVE
Glucose, UA: NEGATIVE
Leukocytes,UA: NEGATIVE
Nitrite, UA: NEGATIVE
Protein,UA: NEGATIVE
Specific Gravity, UA: 1.025 (ref 1.005–1.030)
Urobilinogen, Ur: 0.2 mg/dL (ref 0.2–1.0)
pH, UA: 5.5 (ref 5.0–7.5)

## 2019-01-03 LAB — MICROSCOPIC EXAMINATION
Bacteria, UA: NONE SEEN
WBC, UA: NONE SEEN /hpf (ref 0–5)

## 2019-01-03 MED ORDER — ATORVASTATIN CALCIUM 10 MG PO TABS: 10.0000 mg | ORAL_TABLET | Freq: Every day | ORAL | 1 refills | Status: DC

## 2019-01-03 MED ORDER — GABAPENTIN 600 MG PO TABS: 600.0000 mg | ORAL_TABLET | Freq: Two times a day (BID) | ORAL | 1 refills | Status: DC

## 2019-01-03 NOTE — Progress Notes (Signed)
BP 112/74 (BP Location: Left Arm, Patient Position: Sitting, Cuff Size: Normal)   Pulse 82   Temp 98.5 F (36.9 C) (Oral)   Ht 5\' 11"  (1.803 m)   Wt 263 lb 6.4 oz (119.5 kg)   SpO2 99%   BMI 36.74 kg/m    Subjective:    Patient ID: Justin [name], male    DOB: 07-12-1965, 53 y.o.   MRN: [id number]  HPI: Justin [name] is a 53 y.o. male presenting on 01/03/2019 for comprehensive medical examination. Current medical complaints include:see below  Still having some significant right rib pain from his surgical removal of a bony rib mass. Following with orthopedic surgery and pain management for this. Also taking gabapentin, which he currently takes about 300 mg in the AM and 600 in the PM. Interested in increasing this a bit as it does seem to help. Has not yet been cleared by specialist to return to work given his slow progress in healing from this procedure and continued pain.   On lipitor for HLD, tolerating well. Trying to eat well, unable to exercise given his chronic rib pain.   Follows with Hematology for hx of multiple DVTs, on lovenox injections for this.   He currently lives with: Interim Problems from his last visit: no  Depression Screen done today and results listed below:  Depression screen [facility] 2/9 12/21/2018 02/02/2018 12/29/2017 12/05/2017 10/11/2017  Decreased Interest 0 0 0 0 0  Down, Depressed, Hopeless 0 0 0 0 -  PHQ - 2 Score 0 0 0 0 0  Altered sleeping - - - - -  Tired, decreased energy - - - - -  Change in appetite - - - - -  Feeling bad or failure about yourself  - - - - -  Trouble concentrating - - - - -  Moving slowly or fidgety/restless - - - - -  Suicidal thoughts - - - - -  PHQ-9 Score - - - - -    The patient does not have a history of falls. I did not complete a risk assessment for falls. A plan of care for falls was not documented.   Past Medical History:  Past Medical History:  Diagnosis Date  . Arm vein blood clot    right  . Coronary  artery disease   . DVT (deep venous thrombosis) ([place]) 2012; January 2016   History of right arm; now right lower shotty  . GERD (gastroesophageal reflux disease)   . Rib pain on right side 08/04/2016   RT rib pain that radiates around to the back. Level 8 of 10.  Constant. Started 1 mo ago, 1 week after DVT.  Marland Kitchen Vascular thoracic outlet syndrome    Right     Surgical History:  Past Surgical History:  Procedure Laterality Date  . CAROTID ANGIOGRAM    . KNEE CARTILAGE SURGERY     Performed for cracked patella and torn meniscus  . Right upper extremity thrombectomy  2012  . SCALENOTOMY W/O RESECTION CERVICAL RIB  2012   4 right-sided thoracic outlet syndrome    Medications:  Current Outpatient Medications on File Prior to Visit  Medication Sig  . acetaminophen (TYLENOL) 500 MG tablet Take 1,000 mg by mouth every 6 (six) hours as needed.  . Calcium Carbonate-Vit D-Min (GNP CALCIUM 1200) 1200-1000 MG-UNIT CHEW Chew 1,200 mg by mouth daily with breakfast. Take in combination with vitamin D and magnesium.  Marland Kitchen enoxaparin (LOVENOX) 150 MG/ML injection INJECT  0.74 MLS (110 MG TOTAL) INTO THE SKIN 2 (TWO) TIMES DAILY  . ibuprofen (ADVIL,MOTRIN) 800 MG tablet Take 800 mg by mouth every 8 (eight) hours as needed.  Marland Kitchen oxyCODONE (OXY IR/ROXICODONE) 5 MG immediate release tablet Take 1 tablet (5 mg total) by mouth every 8 (eight) hours as needed for severe pain. Must last 30 days  . [START ON 01/31/2019] oxyCODONE (OXY IR/ROXICODONE) 5 MG immediate release tablet Take 1 tablet (5 mg total) by mouth every 8 (eight) hours as needed for severe pain. Must last 30 days  . [START ON 03/02/2019] oxyCODONE (OXY IR/ROXICODONE) 5 MG immediate release tablet Take 1 tablet (5 mg total) by mouth every 8 (eight) hours as needed for severe pain. Must last 30 days   No current facility-administered medications on file prior to visit.     Allergies:  No Known Allergies  Social History:  Social History    Socioeconomic History  . Marital status: Married    Spouse name: Not on file  . Number of children: Not on file  . Years of education: Not on file  . Highest education level: Not on file  Occupational History    Comment: [employer]  Social Needs  . Financial resource strain: Not on file  . Food insecurity    Worry: Not on file    Inability: Not on file  . Transportation needs    Medical: Not on file    Non-medical: Not on file  Tobacco Use  . Smoking status: Former Smoker    Packs/day: 0.50  . Smokeless tobacco: Never Used  . Tobacco comment: quit smoking in 2010  Substance and Sexual Activity  . Alcohol use: No  . Drug use: No  . Sexual activity: Yes    Birth control/protection: None  Lifestyle  . Physical activity    Days per week: Not on file    Minutes per session: Not on file  . Stress: Not on file  Relationships  . Social Herbalist on phone: Not on file    Gets together: Not on file    Attends religious service: Not on file    Active member of club or organization: Not on file    Attends meetings of clubs or organizations: Not on file    Relationship status: Not on file  . Intimate partner violence    Fear of current or ex partner: Not on file    Emotionally abused: Not on file    Physically abused: Not on file    Forced sexual activity: Not on file  Other Topics Concern  . Not on file  Social History Narrative   He works full-time at [employer].    He is married. Nonsmoker who is not currently. Alcohol.   Social History   Tobacco Use  Smoking Status Former Smoker  . Packs/day: 0.50  Smokeless Tobacco Never Used  Tobacco Comment   quit smoking in 2010   Social History   Substance and Sexual Activity  Alcohol Use No    Family History:  Family History  Problem Relation Age of Onset  . Heart disease Father   . Breast cancer Father 42  . Diabetes Father   . Heart attack Father   . Cancer Maternal Grandmother   . Leukemia Maternal Grandfather    . Prostate cancer Maternal Uncle     Past medical history, surgical history, medications, allergies, family history and social history reviewed with patient today and changes made to appropriate  areas of the chart.   Review of Systems - General ROS: negative Psychological ROS: negative Ophthalmic ROS: negative ENT ROS: negative Allergy and Immunology ROS: negative Hematological and Lymphatic ROS: negative Endocrine ROS: negative Breast ROS: negative for breast lumps Respiratory ROS: no cough, shortness of breath, or wheezing Cardiovascular ROS: no chest pain or dyspnea on exertion Gastrointestinal ROS: no abdominal pain, change in bowel habits, or black or bloody stools Genito-Urinary ROS: no dysuria, trouble voiding, or hematuria Musculoskeletal ROS: chronic right rib pain Neurological ROS: no TIA or stroke symptoms Dermatological ROS: negative All other ROS negative except what is listed above and in the HPI.      Objective:    BP 112/74 (BP Location: Left Arm, Patient Position: Sitting, Cuff Size: Normal)   Pulse 82   Temp 98.5 F (36.9 C) (Oral)   Ht 5\' 11"  (1.803 m)   Wt 263 lb 6.4 oz (119.5 kg)   SpO2 99%   BMI 36.74 kg/m   Wt Readings from Last 3 Encounters:  01/03/19 263 lb 6.4 oz (119.5 kg)  11/02/18 265 lb 6.4 oz (120.4 kg)  08/15/18 267 lb 1.6 oz (121.2 kg)    Physical Exam Vitals signs and nursing note reviewed.  Constitutional:      General: He is not in acute distress.    Appearance: He is well-developed.  HENT:     Head: Atraumatic.     Right Ear: Tympanic membrane and external ear normal.     Left Ear: Tympanic membrane and external ear normal.     Nose: Nose normal.     Mouth/Throat:     Mouth: Mucous membranes are moist.     Pharynx: Oropharynx is clear.  Eyes:     General: No scleral icterus.    Conjunctiva/sclera: Conjunctivae normal.     Pupils: Pupils are equal, round, and reactive to light.  Neck:     Musculoskeletal: Normal range of  motion and neck supple.  Cardiovascular:     Rate and Rhythm: Normal rate and regular rhythm.     Heart sounds: Normal heart sounds. No murmur.  Pulmonary:     Effort: Pulmonary effort is normal. No respiratory distress.     Breath sounds: Normal breath sounds.  Abdominal:     General: Bowel sounds are normal. There is no distension.     Palpations: Abdomen is soft. There is no mass.     Tenderness: There is no abdominal tenderness. There is no guarding.  Genitourinary:    Comments: GU exam recently performed through Urology Musculoskeletal: Normal range of motion.        General: Tenderness (right lateral rib ttp in area of previous surgery) present.  Skin:    General: Skin is warm and dry.     Findings: No rash.  Neurological:     General: No focal deficit present.     Mental Status: He is alert and oriented to person, place, and time.     Deep Tendon Reflexes: Reflexes are normal and symmetric.  Psychiatric:        Mood and Affect: Mood normal.        Behavior: Behavior normal.        Thought Content: Thought content normal.        Judgment: Judgment normal.     Results for orders placed or performed in visit on 01/03/19  Microscopic Examination   URINE  Result Value Ref Range   WBC, UA None seen 0 - 5 /hpf  RBC 0-2 0 - 2 /hpf   Epithelial Cells (non renal) 0-10 0 - 10 /hpf   Mucus, UA Present Not Estab.   Bacteria, UA None seen None seen/Few  CBC with Differential/Platelet  Result Value Ref Range   WBC 8.6 3.4 - 10.8 x10E3/uL   RBC 4.94 4.14 - 5.80 x10E6/uL   Hemoglobin 15.0 13.0 - 17.7 g/dL   Hematocrit 43.4 37.5 - 51.0 %   MCV 88 79 - 97 fL   MCH 30.4 26.6 - 33.0 pg   MCHC 34.6 31.5 - 35.7 g/dL   RDW 12.9 11.6 - 15.4 %   Platelets 274 150 - 450 x10E3/uL   Neutrophils 57 Not Estab. %   Lymphs 28 Not Estab. %   Monocytes 11 Not Estab. %   Eos 2 Not Estab. %   Basos 1 Not Estab. %   Neutrophils Absolute 5.0 1.4 - 7.0 x10E3/uL   Lymphocytes Absolute 2.4 0.7  - 3.1 x10E3/uL   Monocytes Absolute 0.9 0.1 - 0.9 x10E3/uL   EOS (ABSOLUTE) 0.2 0.0 - 0.4 x10E3/uL   Basophils Absolute 0.1 0.0 - 0.2 x10E3/uL   Immature Granulocytes 1 Not Estab. %   Immature Grans (Abs) 0.0 0.0 - 0.1 x10E3/uL  Comprehensive metabolic panel  Result Value Ref Range   Glucose 91 65 - 99 mg/dL   BUN 10 6 - 24 mg/dL   Creatinine, Ser 1.17 0.76 - 1.27 mg/dL   GFR calc non Af Amer 71 >59 mL/min/1.73   GFR calc Af Amer 82 >59 mL/min/1.73   BUN/Creatinine Ratio 9 9 - 20   Sodium 140 134 - 144 mmol/L   Potassium 5.0 3.5 - 5.2 mmol/L   Chloride 102 96 - 106 mmol/L   CO2 19 (L) 20 - 29 mmol/L   Calcium 9.5 8.7 - 10.2 mg/dL   Total Protein 7.1 6.0 - 8.5 g/dL   Albumin 4.2 3.8 - 4.9 g/dL   Globulin, Total 2.9 1.5 - 4.5 g/dL   Albumin/Globulin Ratio 1.4 1.2 - 2.2   Bilirubin Total <0.2 0.0 - 1.2 mg/dL   Alkaline Phosphatase 91 39 - 117 IU/L   AST 23 0 - 40 IU/L   ALT 36 0 - 44 IU/L  Lipid Panel w/o Chol/HDL Ratio  Result Value Ref Range   Cholesterol, Total 258 (H) 100 - 199 mg/dL   Triglycerides 330 (H) 0 - 149 mg/dL   HDL 54 >39 mg/dL   VLDL Cholesterol Cal 66 (H) 5 - 40 mg/dL   LDL Calculated 138 (H) 0 - 99 mg/dL  UA/M w/rflx Culture, Routine   Specimen: Urine   URINE  Result Value Ref Range   Specific Gravity, UA 1.025 1.005 - 1.030   pH, UA 5.5 5.0 - 7.5   Color, UA Yellow Yellow   Appearance Ur Clear Clear   Leukocytes,UA Negative Negative   Protein,UA Negative Negative/Trace   Glucose, UA Negative Negative   Ketones, UA Trace (A) Negative   RBC, UA 1+ (A) Negative   Bilirubin, UA Negative Negative   Urobilinogen, Ur 0.2 0.2 - 1.0 mg/dL   Nitrite, UA Negative Negative   Microscopic Examination See below:       Assessment & Plan:   Problem List Items Addressed This Visit      Cardiovascular and Mediastinum   DVT (deep venous thrombosis) (H[facility])    Following with Hematology, on lovenox injections        Other   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 Continue current regimen and work on lifestyle modifications      Relevant Orders   Lipid Panel w/o Chol/HDL Ratio (Completed)    Other Visit Diagnoses    Annual physical exam       Relevant Orders   CBC with Differential/Platelet (Completed)   Comprehensive metabolic panel (Completed)   UA/M w/rflx Culture, Routine (Completed)       Discussed aspirin prophylaxis for myocardial infarction prevention and decision was -  on lovenox through Hematology  LABORATORY TESTING:  Health maintenance labs ordered today as discussed above.   The natural history of prostate cancer and ongoing controversy regarding screening and potential treatment outcomes of prostate cancer has been discussed with the patient. The meaning of a false positive PSA and a false negative PSA has been discussed. He indicates understanding of the limitations of this screening test and wishes not to proceed with screening PSA testing.   IMMUNIZATIONS:   - Tdap: Tetanus vaccination status reviewed: last tetanus booster within 10 years. - Influenza: Postponed to flu season  SCREENING: - Colonoscopy: Refused  Discussed with patient purpose of the colonoscopy is to detect colon cancer at curable precancerous or early stages   PATIENT COUNSELING:    Sexuality: Discussed sexually transmitted diseases, partner selection, use of condoms, avoidance of unintended pregnancy  and contraceptive alternatives.   Advised to avoid cigarette smoking.  I discussed with the patient that most people either abstain from alcohol or drink within safe limits (<=14/week and <=4 drinks/occasion for males, <=7/weeks and <= 3 drinks/occasion for females) and that the risk for alcohol disorders and other health effects rises  proportionally with the number of drinks per week and how often a drinker exceeds daily limits.  Discussed cessation/primary prevention of drug use and availability of treatment for abuse.   Diet: Encouraged to adjust caloric intake to maintain  or achieve ideal body weight, to reduce intake of dietary saturated fat and total fat, to limit sodium intake by avoiding high sodium foods and not adding table salt, and to maintain adequate dietary potassium and calcium preferably from fresh fruits, vegetables, and low-fat dairy products.    stressed the importance of regular exercise  Injury prevention: Discussed safety belts, safety helmets, smoke detector, smoking near bedding or upholstery.   Dental health: Discussed importance of regular tooth brushing, flossing, and dental visits.   Follow up plan: NEXT PREVENTATIVE PHYSICAL DUE IN 1 YEAR. Return in about 6 months (around 07/06/2019) for 6 month f/u.

## 2019-01-04 ENCOUNTER — Other Ambulatory Visit: Payer: Self-pay | Admitting: Family Medicine

## 2019-01-04 LAB — COMPREHENSIVE METABOLIC PANEL
ALT: 36 IU/L (ref 0–44)
AST: 23 IU/L (ref 0–40)
Albumin/Globulin Ratio: 1.4 (ref 1.2–2.2)
Albumin: 4.2 g/dL (ref 3.8–4.9)
Alkaline Phosphatase: 91 IU/L (ref 39–117)
BUN/Creatinine Ratio: 9 (ref 9–20)
BUN: 10 mg/dL (ref 6–24)
Bilirubin Total: <0.2 mg/dL (ref 0.0–1.2)
CO2: 19 mmol/L — ABNORMAL LOW (ref 20–29)
Calcium: 9.5 mg/dL (ref 8.7–10.2)
Chloride: 102 mmol/L (ref 96–106)
Creatinine, Ser: 1.17 mg/dL (ref 0.76–1.27)
GFR calc Af Amer: 82 mL/min/{1.73_m2} (ref >59)
GFR calc non Af Amer: 71 mL/min/{1.73_m2} (ref >59)
Globulin, Total: 2.9 g/dL (ref 1.5–4.5)
Glucose: 91 mg/dL (ref 65–99)
Potassium: 5 mmol/L (ref 3.5–5.2)
Sodium: 140 mmol/L (ref 134–144)
Total Protein: 7.1 g/dL (ref 6.0–8.5)

## 2019-01-04 LAB — CBC WITH DIFFERENTIAL/PLATELET
Basophils Absolute: 0.1 10*3/uL (ref 0.0–0.2)
Basos: 1 %
EOS (ABSOLUTE): 0.2 10*3/uL (ref 0.0–0.4)
Eos: 2 %
Hematocrit: 43.4 % (ref 37.5–51.0)
Hemoglobin: 15 g/dL (ref 13.0–17.7)
Immature Grans (Abs): 0 10*3/uL (ref 0.0–0.1)
Immature Granulocytes: 1 %
Lymphocytes Absolute: 2.4 10*3/uL (ref 0.7–3.1)
Lymphs: 28 %
MCH: 30.4 pg (ref 26.6–33.0)
MCHC: 34.6 g/dL (ref 31.5–35.7)
MCV: 88 fL (ref 79–97)
Monocytes Absolute: 0.9 10*3/uL (ref 0.1–0.9)
Monocytes: 11 %
Neutrophils Absolute: 5 10*3/uL (ref 1.4–7.0)
Neutrophils: 57 %
Platelets: 274 10*3/uL (ref 150–450)
RBC: 4.94 x10E6/uL (ref 4.14–5.80)
RDW: 12.9 % (ref 11.6–15.4)
WBC: 8.6 10*3/uL (ref 3.4–10.8)

## 2019-01-04 LAB — LIPID PANEL W/O CHOL/HDL RATIO
Cholesterol, Total: 258 mg/dL — ABNORMAL HIGH (ref 100–199)
HDL: 54 mg/dL (ref >39)
LDL Calculated: 138 mg/dL — ABNORMAL HIGH (ref 0–99)
Triglycerides: 330 mg/dL — ABNORMAL HIGH (ref 0–149)
VLDL Cholesterol Cal: 66 mg/dL — ABNORMAL HIGH (ref 5–40)

## 2019-01-04 MED ORDER — ATORVASTATIN CALCIUM 20 MG PO TABS: 20.0000 mg | ORAL_TABLET | Freq: Every day | ORAL | 1 refills | Status: DC

## 2019-01-08 DIAGNOSIS — E782 Mixed hyperlipidemia: Secondary | ICD-10-CM | POA: Insufficient documentation

## 2019-01-08 NOTE — Assessment & Plan Note (Signed)
Recheck lipids, adjust as needed. Continue current regimen and work on lifestyle modifications 

## 2019-01-08 NOTE — Assessment & Plan Note (Signed)
Increase gabapentin to 600 mg BID and continue following with Pain Management and Orthopedic Surgery

## 2019-01-08 NOTE — Assessment & Plan Note (Signed)
Following with Hematology, on lovenox injections

## 2019-02-13 ENCOUNTER — Inpatient Hospital Stay: Payer: 59

## 2019-02-13 ENCOUNTER — Inpatient Hospital Stay: Payer: 59 | Attending: Hematology and Oncology | Admitting: Hematology and Oncology

## 2019-03-27 ENCOUNTER — Encounter: Payer: Self-pay | Admitting: Pain Medicine

## 2019-03-28 ENCOUNTER — Ambulatory Visit: Payer: 59 | Attending: Pain Medicine | Admitting: Pain Medicine

## 2019-03-28 ENCOUNTER — Other Ambulatory Visit: Payer: Self-pay

## 2019-03-28 DIAGNOSIS — G894 Chronic pain syndrome: Secondary | ICD-10-CM

## 2019-03-28 DIAGNOSIS — R1011 Right upper quadrant pain: Secondary | ICD-10-CM

## 2019-03-28 DIAGNOSIS — M546 Pain in thoracic spine: Secondary | ICD-10-CM | POA: Diagnosis not present

## 2019-03-28 DIAGNOSIS — E559 Vitamin D deficiency, unspecified: Secondary | ICD-10-CM

## 2019-03-28 DIAGNOSIS — M5414 Radiculopathy, thoracic region: Secondary | ICD-10-CM | POA: Diagnosis not present

## 2019-03-28 DIAGNOSIS — R0781 Pleurodynia: Secondary | ICD-10-CM

## 2019-03-28 DIAGNOSIS — G8929 Other chronic pain: Secondary | ICD-10-CM

## 2019-03-28 MED ORDER — OXYCODONE HCL 5 MG PO TABS: 5.0000 mg | ORAL_TABLET | Freq: Three times a day (TID) | ORAL | 0 refills | Status: DC | PRN

## 2019-03-28 MED ORDER — GNP CALCIUM 1200 1200-1000 MG-UNIT PO CHEW: 1200.0000 mg | CHEWABLE_TABLET | Freq: Every day | ORAL | 3 refills | Status: DC

## 2019-03-28 NOTE — Progress Notes (Signed)
Pain Management Virtual Encounter Note - Virtual Visit via Telephone Telehealth (real-time audio visits between healthcare provider and patient).   Patient's Phone No. & Preferred Pharmacy:  [phone] (home); [phone] (mobile); (Preferred) [phone] [email or web address]  [facility] [id number] [place] [address] Phone: [phone] Fax: [phone]    Pre-screening note:  Our staff contacted Mr. Fama and offered him an "in person", "face-to-face" appointment versus a telephone encounter. He indicated preferring the telephone encounter, at this time.   Reason for Virtual Visit: COVID-19*  Social distancing based on CDC and AMA recommendations.   I contacted [name] on 03/28/2019 via telephone.      I clearly identified myself as Gaspar Cola, MD. I verified that I was speaking with the correct person using two identifiers (Name: HOY LYDAY, and date of birth: [birth year]).  Advanced Informed Consent I sought verbal advanced consent from [name] for virtual visit interactions. I informed Mr. Bustillos of possible security and privacy concerns, risks, and limitations associated with providing "not-in-person" medical evaluation and management services. I also informed Mr. Labombard of the availability of "in-person" appointments. Finally, I informed him that there would be a charge for the virtual visit and that he could be  personally, fully or partially, financially responsible for it. Mr. Felgar expressed understanding and agreed to proceed.   Historic Elements   Justin [name] is a 53 y.o. year old, male patient evaluated today after his last encounter by our practice on 12/25/2018. Mr. Fearnley  has a past medical history of Arm vein blood clot, Coronary artery disease, DVT (deep venous thrombosis) ([place]) (2012; January 2016), GERD  (gastroesophageal reflux disease), Rib pain on right side (08/04/2016), and Vascular thoracic outlet syndrome. He also  has a past surgical history that includes Knee cartilage surgery; Carotid angiogram; Scalenotomy w/o resection cervical rib (2012); and Right upper extremity thrombectomy (2012). Mr. Paisley has a current medication list which includes the following prescription(s): acetaminophen, atorvastatin, gnp calcium 1200, enoxaparin, gabapentin, ibuprofen, oxycodone, oxycodone, and oxycodone. He  reports that he has quit smoking. He smoked 0.50 packs per day. He has never used smokeless tobacco. He reports that he does not drink alcohol or use drugs. Mr. Abend has No Known Allergies.   HPI  Today, he is being contacted for medication management.  The patient indicates doing well with the current medication regimen. No adverse reactions or side effects reported to the medications.   Pharmacotherapy Assessment  Analgesic: OxycodoneIR5 mg,1 tab PO q 8 hrs(15mg /day of oxycodone) MME/day:22.5mg /day.   Monitoring: Pharmacotherapy: No side-effects or adverse reactions reported. [place] PMP: PDMP reviewed during this encounter.       Compliance: No problems identified. Effectiveness: Clinically acceptable. Plan: Refer to "POC".  UDS:  Summary  Date Value Ref Range Status  06/29/2018 FINAL  Final    Comment:    ==================================================================== TOXASSURE SELECT 13 (MW) ==================================================================== Test                             Result       Flag       Units Drug Present and Declared for Prescription Verification   Oxycodone                      1316  EXPECTED   ng/mg creat   Oxymorphone                    681          EXPECTED   ng/mg creat   Noroxycodone                   942          EXPECTED   ng/mg creat   Noroxymorphone                 98           EXPECTED   ng/mg creat    Sources of oxycodone  are scheduled prescription medications.    Oxymorphone, noroxycodone, and noroxymorphone are expected    metabolites of oxycodone. Oxymorphone is also available as a    scheduled prescription medication. ==================================================================== Test                      Result    Flag   Units      Ref Range   Creatinine              345              mg/dL      >=20 ==================================================================== Declared Medications:  The flagging and interpretation on this report are based on the  following declared medications.  Unexpected results may arise from  inaccuracies in the declared medications.  **Note: The testing scope of this panel includes these medications:  Oxycodone  **Note: The testing scope of this panel does not include following  reported medications:  Acetaminophen (Tylenol)  Atorvastatin (Lipitor)  Calcium  Enoxaparin (Lovenox)  Gabapentin (Neurontin)  Ibuprofen ==================================================================== For clinical consultation, please call [phone]. ====================================================================    Laboratory Chemistry Profile (12 mo)  Renal: 01/03/2019: BUN 10; BUN/Creatinine Ratio 9; Creatinine, Ser 1.17  Lab Results  Component Value Date   GFRAA 82 01/03/2019   GFRNONAA 71 01/03/2019   Hepatic: 01/03/2019: Albumin 4.2 Lab Results  Component Value Date   AST 23 01/03/2019   ALT 36 01/03/2019   Other: 06/29/2018: 25-Hydroxy, Vitamin D 15; 25-Hydroxy, Vitamin D-2 8.4; 25-Hydroxy, Vitamin D-3 6.8; CRP 12; Sed Rate 27 Note: Above Lab results reviewed.  Imaging  Last 90 days:  No results found.  Assessment  The primary encounter diagnosis was Chronic pain syndrome. Diagnoses of Chronic thoracic back pain (Primary Area of Pain) (Right), Thoracic radiculitis (Secondary Area of Pain), Right upper quadrant abdominal pain (Tertiary Area of Pain), Rib  pain on right side (Fourth Area of Pain), and Vitamin D deficiency were also pertinent to this visit.  Plan of Care  I am having [name] start on oxyCODONE and oxyCODONE. I am also having him maintain his acetaminophen, ibuprofen, enoxaparin, gabapentin, atorvastatin, GNP Calcium 1200, and oxyCODONE.  Pharmacotherapy (Medications Ordered): Meds ordered this encounter  Medications  . Calcium Carbonate-Vit D-Min (GNP CALCIUM 1200) 1200-1000 MG-UNIT CHEW    Sig: Chew 1,200 mg by mouth daily with breakfast. Take in combination with vitamin D and magnesium.    Dispense:  90 tablet    Refill:  3    Fill one day early if pharmacy is closed on scheduled refill date. May substitute for generic if available.  Marland Kitchen oxyCODONE (OXY IR/ROXICODONE) 5 MG immediate release tablet    Sig: Take 1 tablet (5 mg total) by mouth every 8 (eight) hours as needed for severe pain.  Must last 30 days    Dispense:  90 tablet    Refill:  0    Chronic Pain: STOP Act (Not applicable) Fill 1 day early if closed on refill date. Do not fill until: 04/05/2019. To last until: 05/05/2019. Avoid benzodiazepines within 8 hours of opioids  . oxyCODONE (OXY IR/ROXICODONE) 5 MG immediate release tablet    Sig: Take 1 tablet (5 mg total) by mouth every 8 (eight) hours as needed for severe pain. Must last 30 days    Dispense:  90 tablet    Refill:  0    Chronic Pain: STOP Act (Not applicable) Fill 1 day early if closed on refill date. Do not fill until: 05/05/2019. To last until: 06/04/2019. Avoid benzodiazepines within 8 hours of opioids  . oxyCODONE (OXY IR/ROXICODONE) 5 MG immediate release tablet    Sig: Take 1 tablet (5 mg total) by mouth every 8 (eight) hours as needed for severe pain. Must last 30 days    Dispense:  90 tablet    Refill:  0    Chronic Pain: STOP Act (Not applicable) Fill 1 day early if closed on refill date. Do not fill until: 06/04/2019. To last until: 07/04/2019. Avoid benzodiazepines within 8 hours of  opioids   Orders:  No orders of the defined types were placed in this encounter.  Follow-up plan:   Return in about 14 weeks (around 07/04/2019) for (VV), (MM).      Interventional management options:  Considering:   Diagnostic right thoracicSNRB Diagnosticright thoracicfacetblock Possible right thoracic facet medial branch RFA   Palliative PRN treatment(s):   Palliative right T6, T7, T8, &T9intercostal nerve block  Palliative right T6, T7, T8, &T9intercostal nerve RFA #2 (Last done 12/29/17)     Recent Visits No visits were found meeting these conditions.  Showing recent visits within past 90 days and meeting all other requirements   Today's Visits Date Type Provider Dept  03/28/19 Office Visit Milinda Pointer, MD [facility]  Showing today's visits and meeting all other requirements   Future Appointments No visits were found meeting these conditions.  Showing future appointments within next 90 days and meeting all other requirements   I discussed the assessment and treatment plan with the patient. The patient was provided an opportunity to ask questions and all were answered. The patient agreed with the plan and demonstrated an understanding of the instructions.  Patient advised to call back or seek an in-person evaluation if the symptoms or condition worsens.  Total duration of non-face-to-face encounter: 13 minutes.  Note by: Gaspar Cola, MD Date: 03/28/2019; Time: 2:39 PM  Note: This dictation was prepared with Dragon dictation. Any transcriptional errors that may result from this process are unintentional.  Disclaimer:  * Given the special circumstances of the COVID-19 pandemic, the federal government has announced that the Office for Civil Rights (OCR) will exercise its enforcement discretion and will not impose penalties on physicians using telehealth in the event of noncompliance with regulatory requirements under the [place] (HIPAA) in connection with the good faith provision of telehealth during the XX123456 national public health emergency. ([place])

## 2019-03-28 NOTE — Patient Instructions (Signed)
____________________________________________________________________________________________  Medication Rules  Purpose: To inform patients, and their family members, of our rules and regulations.  Applies to: All patients receiving prescriptions (written or electronic).  Pharmacy of record: Pharmacy where electronic prescriptions will be sent. If written prescriptions are taken to a different pharmacy, please inform the nursing staff. The pharmacy listed in the electronic medical record should be the one where you would like electronic prescriptions to be sent.  Electronic prescriptions: In compliance with the [place] Strengthen Opioid Misuse Prevention (STOP) Act of 2017 (Session Law 2017-74/H243), effective June 07, 2018, all controlled substances must be electronically prescribed. Calling prescriptions to the pharmacy will cease to exist.  Prescription refills: Only during scheduled appointments. Applies to all prescriptions.  NOTE: The following applies primarily to controlled substances (Opioid* Pain Medications).   Patient's responsibilities: 1. Pain Pills: Bring all pain pills to every appointment (except for procedure appointments). 2. Pill Bottles: Bring pills in original pharmacy bottle. Always bring the newest bottle. Bring bottle, even if empty. 3. Medication refills: You are responsible for knowing and keeping track of what medications you take and those you need refilled. The day before your appointment: write a list of all prescriptions that need to be refilled. The day of the appointment: give the list to the admitting nurse. Prescriptions will be written only during appointments. No prescriptions will be written on procedure days. If you forget a medication: it will not be "Called in", "Faxed", or "electronically sent". You will need to get another appointment to get these prescribed. No early refills. Do not call asking to have your prescription filled  early. 4. Prescription Accuracy: You are responsible for carefully inspecting your prescriptions before leaving our office. Have the discharge nurse carefully go over each prescription with you, before taking them home. Make sure that your name is accurately spelled, that your address is correct. Check the name and dose of your medication to make sure it is accurate. Check the number of pills, and the written instructions to make sure they are clear and accurate. Make sure that you are given enough medication to last until your next medication refill appointment. 5. Taking Medication: Take medication as prescribed. When it comes to controlled substances, taking less pills or less frequently than prescribed is permitted and encouraged. Never take more pills than instructed. Never take medication more frequently than prescribed.  6. Inform other Doctors: Always inform, all of your healthcare providers, of all the medications you take. 7. Pain Medication from other Providers: You are not allowed to accept any additional pain medication from any other Doctor or Healthcare provider. There are two exceptions to this rule. (see below) In the event that you require additional pain medication, you are responsible for notifying us, as stated below. 8. Medication Agreement: You are responsible for carefully reading and following our Medication Agreement. This must be signed before receiving any prescriptions from our practice. Safely store a copy of your signed Agreement. Violations to the Agreement will result in no further prescriptions. (Additional copies of our Medication Agreement are available upon request.) 9. Laws, Rules, & Regulations: All patients are expected to follow all Federal and State Laws, Statutes, Rules, & Regulations. Ignorance of the Laws does not constitute a valid excuse. The use of any illegal substances is prohibited. 10. Adopted CDC guidelines & recommendations: Target dosing levels will be  at or below 60 MME/day. Use of benzodiazepines** is not recommended.  Exceptions: There are only two exceptions to the rule of not   receiving pain medications from other Healthcare Providers. 1. Exception #1 (Emergencies): In the event of an emergency (i.e.: accident requiring emergency care), you are allowed to receive additional pain medication. However, you are responsible for: As soon as you are able, call our office [phone], at any time of the day or night, and leave a message stating your name, the date and nature of the emergency, and the name and dose of the medication prescribed. In the event that your call is answered by a member of our staff, make sure to document and save the date, time, and the name of the person that took your information.  2. Exception #2 (Planned Surgery): In the event that you are scheduled by another doctor or dentist to have any type of surgery or procedure, you are allowed (for a period no longer than 30 days), to receive additional pain medication, for the acute post-op pain. However, in this case, you are responsible for picking up a copy of our "Post-op Pain Management for Surgeons" handout, and giving it to your surgeon or dentist. This document is available at our office, and does not require an appointment to obtain it. Simply go to our office during business hours (Monday-Thursday from 8:00 AM to 4:00 PM) (Friday 8:00 AM to 12:00 Noon) or if you have a scheduled appointment with us, prior to your surgery, and ask for it by name. In addition, you will need to provide us with your name, name of your surgeon, type of surgery, and date of procedure or surgery.  *Opioid medications include: morphine, codeine, oxycodone, oxymorphone, hydrocodone, hydromorphone, meperidine, tramadol, tapentadol, buprenorphine, fentanyl, methadone. **Benzodiazepine medications include: diazepam (Valium), alprazolam (Xanax), clonazepam (Klonopine), lorazepam (Ativan), clorazepate  (Tranxene), chlordiazepoxide (Librium), estazolam (Prosom), oxazepam (Serax), temazepam (Restoril), triazolam (Halcion) (Last updated: 08/04/2017) ____________________________________________________________________________________________   ____________________________________________________________________________________________  Medication Recommendations and Reminders  Applies to: All patients receiving prescriptions (written and/or electronic).  Medication Rules & Regulations: These rules and regulations exist for your safety and that of others. They are not flexible and neither are we. Dismissing or ignoring them will be considered "non-compliance" with medication therapy, resulting in complete and irreversible termination of such therapy. (See document titled "Medication Rules" for more details.) In all conscience, because of safety reasons, we cannot continue providing a therapy where the patient does not follow instructions.  Pharmacy of record:   Definition: This is the pharmacy where your electronic prescriptions will be sent.   We do not endorse any particular pharmacy.  You are not restricted in your choice of pharmacy.  The pharmacy listed in the electronic medical record should be the one where you want electronic prescriptions to be sent.  If you choose to change pharmacy, simply notify our nursing staff of your choice of new pharmacy.  Recommendations:  Keep all of your pain medications in a safe place, under lock and key, even if you live alone.   After you fill your prescription, take 1 week's worth of pills and put them away in a safe place. You should keep a separate, properly labeled bottle for this purpose. The remainder should be kept in the original bottle. Use this as your primary supply, until it runs out. Once it's gone, then you know that you have 1 week's worth of medicine, and it is time to come in for a prescription refill. If you do this correctly, it  is unlikely that you will ever run out of medicine.  To make sure that the above recommendation works,   it is very important that you make sure your medication refill appointments are scheduled at least 1 week before you run out of medicine. To do this in an effective manner, make sure that you do not leave the office without scheduling your next medication management appointment. Always ask the nursing staff to show you in your prescription , when your medication will be running out. Then arrange for the receptionist to get you a return appointment, at least 7 days before you run out of medicine. Do not wait until you have 1 or 2 pills left, to come in. This is very poor planning and does not take into consideration that we may need to cancel appointments due to bad weather, sickness, or emergencies affecting our staff.  "Partial Fill": If for any reason your pharmacy does not have enough pills/tablets to completely fill or refill your prescription, do not allow for a "partial fill". You will need a separate prescription to fill the remaining amount, which we will not provide. If the reason for the partial fill is your insurance, you will need to talk to the pharmacist about payment alternatives for the remaining tablets, but again, do not accept a partial fill.  Prescription refills and/or changes in medication(s):   Prescription refills, and/or changes in dose or medication, will be conducted only during scheduled medication management appointments. (Applies to both, written and electronic prescriptions.)  No refills on procedure days. No medication will be changed or started on procedure days. No changes, adjustments, and/or refills will be conducted on a procedure day. Doing so will interfere with the diagnostic portion of the procedure.  No phone refills. No medications will be "called into the pharmacy".  No Fax refills.  No weekend refills.  No Holliday refills.  No after hours  refills.  Remember:  Business hours are:  Monday to Thursday 8:00 AM to 4:00 PM Provider's Schedule: Jizelle Conkey, MD - Appointments are:  Medication management: Monday and Wednesday 8:00 AM to 4:00 PM Procedure day: Tuesday and Thursday 7:30 AM to 4:00 PM Bilal Lateef, MD - Appointments are:  Medication management: Tuesday and Thursday 8:00 AM to 4:00 PM Procedure day: Monday and Wednesday 7:30 AM to 4:00 PM (Last update: 08/04/2017) ____________________________________________________________________________________________    

## 2019-06-27 ENCOUNTER — Telehealth: Payer: Self-pay

## 2019-06-27 ENCOUNTER — Encounter: Payer: Self-pay | Admitting: Pain Medicine

## 2019-06-27 NOTE — Telephone Encounter (Signed)
LM for patient to call us for pre virtual appointment questions.    

## 2019-06-28 NOTE — Progress Notes (Signed)
Patient: Justin [name]  Service Category: E/M  Provider: Gaspar Cola, MD  DOB: [birth year]  DOS: 07/02/2019  Location: Office  MRN: [id number]  Setting: Ambulatory outpatient  Referring Provider: Volney American,*  Type: Established Patient  Specialty: Interventional Pain Management  PCP: Volney American, PA-C  Location: Remote location  Delivery: TeleHealth     Virtual Encounter - Pain Management PROVIDER NOTE: Information contained herein reflects review and annotations entered in association with encounter. Interpretation of such information and data should be left to medically-trained personnel. Information provided to patient can be located elsewhere in the medical record under "Patient Instructions". Document created using STT-dictation technology, any transcriptional errors that may result from process are unintentional.    Contact & Pharmacy Preferred: [phone] Home: [phone] (home) Mobile: [phone] (mobile) E-mail: [email or web address]  [facility] [id number] [place] [address] Phone: [phone] Fax: [phone]   Pre-screening  Mr. Terronez offered "in-person" vs "virtual" encounter. He indicated preferring virtual for this encounter.   Reason COVID-19*  Social distancing based on CDC and AMA recommendations.   I contacted [name] on 07/02/2019 via telephone.      I clearly identified myself as Gaspar Cola, MD. I verified that I was speaking with the correct person using two identifiers (Name: BERNERD JEREZ, and date of birth: 1965-11-23).  Consent I sought verbal advanced consent from [name] for virtual visit interactions. I informed Mr. Violett of possible security and privacy concerns, risks, and limitations associated with providing "not-in-person" medical evaluation and management services. I also informed  Mr. Fronheiser of the availability of "in-person" appointments. Finally, I informed him that there would be a charge for the virtual visit and that he could be  personally, fully or partially, financially responsible for it. Mr. Vincente expressed understanding and agreed to proceed.   Historic Elements   Mr. JECOREY FUSILLO is a 54 y.o. year old, male patient evaluated today after his last encounter by our practice on 06/27/2019. Mr. Loy  has a past medical history of Arm vein blood clot, Coronary artery disease, DVT (deep venous thrombosis) ([facility]) (2012; January 2016), GERD (gastroesophageal reflux disease), Rib pain on right side (08/04/2016), and Vascular thoracic outlet syndrome. He also  has a past surgical history that includes Knee cartilage surgery; Carotid angiogram; Scalenotomy w/o resection cervical rib (2012); and Right upper extremity thrombectomy (2012). Mr. Aronhalt has a current medication list which includes the following prescription(s): acetaminophen, atorvastatin, gnp calcium 1200, enoxaparin, gabapentin, ibuprofen, oxycodone, oxycodone, and oxycodone. He  reports that he has quit smoking. He smoked 0.50 packs per day. He has never used smokeless tobacco. He reports that he does not drink alcohol or use drugs. Mr. Rueff has No Known Allergies.   HPI  Today, he is being contacted for medication management. The patient indicates doing well with the current medication regimen. No adverse reactions or side effects reported to the medications.   Pharmacotherapy Assessment  Analgesic: OxycodoneIR5 mg,1 tab PO q 8 hrs(15mg /day of oxycodone) MME/day:22.5mg /day.   Monitoring: Pharmacotherapy: No side-effects or adverse reactions reported. [place] PMP: PDMP reviewed during this encounter.       Compliance: No problems identified. Effectiveness: Clinically acceptable. Plan: Refer to "POC".  UDS:  Summary  Date Value Ref Range Status  06/29/2018 FINAL  Final    Comment:     ==================================================================== TOXASSURE SELECT 47 (MW) ====================================================================  Test                             Result       Flag       Units Drug Present and Declared for Prescription Verification   Oxycodone                      1316         EXPECTED   ng/mg creat   Oxymorphone                    681          EXPECTED   ng/mg creat   Noroxycodone                   942          EXPECTED   ng/mg creat   Noroxymorphone                 98           EXPECTED   ng/mg creat    Sources of oxycodone are scheduled prescription medications.    Oxymorphone, noroxycodone, and noroxymorphone are expected    metabolites of oxycodone. Oxymorphone is also available as a    scheduled prescription medication. ==================================================================== Test                      Result    Flag   Units      Ref Range   Creatinine              345              mg/dL      >=20 ==================================================================== Declared Medications:  The flagging and interpretation on this report are based on the  following declared medications.  Unexpected results may arise from  inaccuracies in the declared medications.  **Note: The testing scope of this panel includes these medications:  Oxycodone  **Note: The testing scope of this panel does not include following  reported medications:  Acetaminophen (Tylenol)  Atorvastatin (Lipitor)  Calcium  Enoxaparin (Lovenox)  Gabapentin (Neurontin)  Ibuprofen ==================================================================== For clinical consultation, please call [phone]. ====================================================================    Laboratory Chemistry Profile (12 mo)  Renal: 01/03/2019: BUN 10; BUN/Creatinine Ratio 9; Creatinine, Ser 1.17  Lab Results  Component Value Date   GFRAA 82 01/03/2019   GFRNONAA 71  01/03/2019   Hepatic: 01/03/2019: Albumin 4.2 Lab Results  Component Value Date   AST 23 01/03/2019   ALT 36 01/03/2019   Other: 06/29/2018: 25-Hydroxy, Vitamin D 15; 25-Hydroxy, Vitamin D-2 8.4; 25-Hydroxy, Vitamin D-3 6.8; CRP 12; Sed Rate 27  Note: Above Lab results reviewed.  Imaging  US Venous Img Lower Unilateral Right CLINICAL DATA:  Right leg pain and swelling for 3 days  EXAM: RIGHT LOWER EXTREMITY VENOUS DOPPLER ULTRASOUND  TECHNIQUE: Gray-scale sonography with graded compression, as well as color Doppler and duplex ultrasound were performed to evaluate the lower extremity deep venous systems from the level of the common femoral vein and including the common femoral, femoral, profunda femoral, popliteal and calf veins including the posterior tibial, peroneal and gastrocnemius veins when visible. The superficial great saphenous vein was also interrogated. Spectral Doppler was utilized to evaluate flow at rest and with distal augmentation maneuvers in the common femoral, femoral and popliteal veins.  COMPARISON:  None.  FINDINGS: Contralateral Common  Femoral Vein: Respiratory phasicity is normal and symmetric with the symptomatic side. No evidence of thrombus. Normal compressibility.  Common Femoral Vein: No evidence of thrombus. Normal compressibility, respiratory phasicity and response to augmentation.  Saphenofemoral Junction: No evidence of thrombus. Normal compressibility and flow on color Doppler imaging.  Profunda Femoral Vein: No evidence of thrombus. Normal compressibility and flow on color Doppler imaging.  Femoral Vein: No evidence of thrombus. Normal compressibility, respiratory phasicity and response to augmentation.  Popliteal Vein: No evidence of thrombus. Normal compressibility, respiratory phasicity and response to augmentation.  Calf Veins: No evidence of thrombus. Normal compressibility and flow on color Doppler imaging.  Superficial  Great Saphenous Vein: No evidence of thrombus. Normal compressibility.  Venous Reflux:  None.  Other Findings:  None.  IMPRESSION: No evidence of deep venous thrombosis.  Electronically Signed   By: Inez Catalina M.D.   On: 06/29/2018 16:30   Assessment  There were no encounter diagnoses.  Plan of Care  Problem-specific:  No problem-specific Assessment & Plan notes found for this encounter.  I am having [name] maintain his acetaminophen, ibuprofen, enoxaparin, gabapentin, atorvastatin, GNP Calcium 1200, oxyCODONE, oxyCODONE, and oxyCODONE.  Pharmacotherapy (Medications Ordered): No orders of the defined types were placed in this encounter.  Orders:  No orders of the defined types were placed in this encounter.  Follow-up plan:   No follow-ups on file.      Interventional management options:  Considering:   Diagnostic right thoracicSNRB Diagnosticright thoracicfacetblock Possible right thoracic facet medial branch RFA   Palliative PRN treatment(s):   Palliative right T6, T7, T8, &T9intercostal nerve block  Palliative right T6, T7, T8, &T9intercostal nerve RFA #2 (Last done 12/29/17)      Recent Visits No visits were found meeting these conditions.  Showing recent visits within past 90 days and meeting all other requirements   Future Appointments Date Type Provider Dept  07/02/19 Telemedicine Milinda Pointer, MD [facility]  Showing future appointments within next 90 days and meeting all other requirements   I discussed the assessment and treatment plan with the patient. The patient was provided an opportunity to ask questions and all were answered. The patient agreed with the plan and demonstrated an understanding of the instructions.  Patient advised to call back or seek an in-person evaluation if the symptoms or condition worsens.  Duration of encounter: 12 minutes.  Note by: Gaspar Cola, MD Date: 07/02/2019; Time:  10:25 AM

## 2019-06-29 ENCOUNTER — Ambulatory Visit (INDEPENDENT_AMBULATORY_CARE_PROVIDER_SITE_OTHER): Payer: 59 | Admitting: Family Medicine

## 2019-06-29 ENCOUNTER — Other Ambulatory Visit: Payer: Self-pay

## 2019-06-29 ENCOUNTER — Ambulatory Visit: Payer: 59 | Attending: Family

## 2019-06-29 ENCOUNTER — Telehealth: Payer: Self-pay

## 2019-06-29 ENCOUNTER — Encounter: Payer: Self-pay | Admitting: Family Medicine

## 2019-06-29 VITALS — Temp 97.8°F | Wt 250.0 lb

## 2019-06-29 DIAGNOSIS — R112 Nausea with vomiting, unspecified: Secondary | ICD-10-CM | POA: Diagnosis not present

## 2019-06-29 DIAGNOSIS — R42 Dizziness and giddiness: Secondary | ICD-10-CM

## 2019-06-29 DIAGNOSIS — R197 Diarrhea, unspecified: Secondary | ICD-10-CM

## 2019-06-29 DIAGNOSIS — Z20822 Contact with and (suspected) exposure to covid-19: Secondary | ICD-10-CM

## 2019-06-29 DIAGNOSIS — J3489 Other specified disorders of nose and nasal sinuses: Secondary | ICD-10-CM | POA: Diagnosis not present

## 2019-06-29 MED ORDER — OMEPRAZOLE 40 MG PO CPDR: 40.0000 mg | DELAYED_RELEASE_CAPSULE | Freq: Every day | ORAL | 0 refills | Status: DC

## 2019-06-29 MED ORDER — ESOMEPRAZOLE MAGNESIUM 40 MG PO CPDR: 40.0000 mg | DELAYED_RELEASE_CAPSULE | Freq: Every day | ORAL | 0 refills | Status: DC

## 2019-06-29 MED ORDER — PREDNISONE 20 MG PO TABS: 40.0000 mg | ORAL_TABLET | Freq: Every day | ORAL | 0 refills | Status: DC

## 2019-06-29 NOTE — Progress Notes (Signed)
Temp 97.8 F (36.6 C) (Temporal)   Wt 250 lb (113.4 kg)   BMI 34.87 kg/m    Subjective:    Patient ID: MAJOUR LAMM, male    DOB: 1966/05/28, 54 y.o.   MRN: [id number]  HPI: TOBE KAN is a 54 y.o. male  Chief Complaint  Patient presents with  . Headache    x about a week  . Dizziness  . Abdominal Pain    cramps    . This visit was completed via WebEx due to the restrictions of the COVID-19 pandemic. All issues as above were discussed and addressed. Physical exam was done as above through visual confirmation on WebEx. If it was felt that the patient should be evaluated in the office, they were directed there. The patient verbally consented to this visit. . Location of the patient: home . Location of the provider: work . Those involved with this call:  . Provider: Merrie Roof, PA-C . CMA: Lesle Chris, [place] . Front Desk/Registration: Jill Side  . Time spent on call: 25 minutes with patient face to face via video conference. More than 50% of this time was spent in counseling and coordination of care. 5 minutes total spent in review of patient's record and preparation of their chart. I verified patient identity using two factors (patient name and date of birth). Patient consents verbally to being seen via telemedicine visit today.   About a week or more of abdominal pain, N/V/D, headache, congestion, fatigue. Some improvement at this point, but still having headache and dizziness with "swimmy headed" feelings. Still also having some sinuses draining. Also having light abdominal cramping and occasional diarrhea. Had been taking cold and sinus medications, imodium and alka seltzer tablets with mild temporary relief. Denies fever, chills, CP, SOB, inability to tolerate PO, sick contacts.   Relevant past medical, surgical, family and social history reviewed and updated as indicated. Interim medical history since our last visit reviewed. Allergies and medications reviewed  and updated.  Review of Systems  Per HPI unless specifically indicated above     Objective:    Temp 97.8 F (36.6 C) (Temporal)   Wt 250 lb (113.4 kg)   BMI 34.87 kg/m   Wt Readings from Last 3 Encounters:  06/29/19 250 lb (113.4 kg)  01/03/19 263 lb 6.4 oz (119.5 kg)  11/02/18 265 lb 6.4 oz (120.4 kg)    Physical Exam Vitals and nursing note reviewed.  Constitutional:      General: He is not in acute distress.    Appearance: Normal appearance.  HENT:     Head: Atraumatic.     Right Ear: External ear normal.     Left Ear: External ear normal.     Nose: Nose normal. No congestion.     Mouth/Throat:     Mouth: Mucous membranes are moist.     Pharynx: Oropharynx is clear.  Eyes:     Extraocular Movements: Extraocular movements intact.     Conjunctiva/sclera: Conjunctivae normal.  Pulmonary:     Effort: Pulmonary effort is normal. No respiratory distress.  Abdominal:     Comments: Unable to perform abdominal exam due to virtual nature of visit  Musculoskeletal:        General: Normal range of motion.     Cervical back: Normal range of motion.  Skin:    General: Skin is dry.     Findings: No erythema or rash.  Neurological:     Mental Status: He is oriented  to person, place, and time.  Psychiatric:        Mood and Affect: Mood normal.        Thought Content: Thought content normal.        Judgment: Judgment normal.     Results for orders placed or performed in visit on 01/03/19  Microscopic Examination   URINE  Result Value Ref Range   WBC, UA None seen 0 - 5 /hpf   RBC 0-2 0 - 2 /hpf   Epithelial Cells (non renal) 0-10 0 - 10 /hpf   Mucus, UA Present Not Estab.   Bacteria, UA None seen None seen/Few  CBC with Differential/Platelet  Result Value Ref Range   WBC 8.6 3.4 - 10.8 x10E3/uL   RBC 4.94 4.14 - 5.80 x10E6/uL   Hemoglobin 15.0 13.0 - 17.7 g/dL   Hematocrit 43.4 37.5 - 51.0 %   MCV 88 79 - 97 fL   MCH 30.4 26.6 - 33.0 pg   MCHC 34.6 31.5 - 35.7  g/dL   RDW 12.9 11.6 - 15.4 %   Platelets 274 150 - 450 x10E3/uL   Neutrophils 57 Not Estab. %   Lymphs 28 Not Estab. %   Monocytes 11 Not Estab. %   Eos 2 Not Estab. %   Basos 1 Not Estab. %   Neutrophils Absolute 5.0 1.4 - 7.0 x10E3/uL   Lymphocytes Absolute 2.4 0.7 - 3.1 x10E3/uL   Monocytes Absolute 0.9 0.1 - 0.9 x10E3/uL   EOS (ABSOLUTE) 0.2 0.0 - 0.4 x10E3/uL   Basophils Absolute 0.1 0.0 - 0.2 x10E3/uL   Immature Granulocytes 1 Not Estab. %   Immature Grans (Abs) 0.0 0.0 - 0.1 x10E3/uL  Comprehensive metabolic panel  Result Value Ref Range   Glucose 91 65 - 99 mg/dL   BUN 10 6 - 24 mg/dL   Creatinine, Ser 1.17 0.76 - 1.27 mg/dL   GFR calc non Af Amer 71 >59 mL/min/1.73   GFR calc Af Amer 82 >59 mL/min/1.73   BUN/Creatinine Ratio 9 9 - 20   Sodium 140 134 - 144 mmol/L   Potassium 5.0 3.5 - 5.2 mmol/L   Chloride 102 96 - 106 mmol/L   CO2 19 (L) 20 - 29 mmol/L   Calcium 9.5 8.7 - 10.2 mg/dL   Total Protein 7.1 6.0 - 8.5 g/dL   Albumin 4.2 3.8 - 4.9 g/dL   Globulin, Total 2.9 1.5 - 4.5 g/dL   Albumin/Globulin Ratio 1.4 1.2 - 2.2   Bilirubin Total <0.2 0.0 - 1.2 mg/dL   Alkaline Phosphatase 91 39 - 117 IU/L   AST 23 0 - 40 IU/L   ALT 36 0 - 44 IU/L  Lipid Panel w/o Chol/HDL Ratio  Result Value Ref Range   Cholesterol, Total 258 (H) 100 - 199 mg/dL   Triglycerides 330 (H) 0 - 149 mg/dL   HDL 54 >39 mg/dL   VLDL Cholesterol Cal 66 (H) 5 - 40 mg/dL   LDL Calculated 138 (H) 0 - 99 mg/dL  UA/M w/rflx Culture, Routine   Specimen: Urine   URINE  Result Value Ref Range   Specific Gravity, UA 1.025 1.005 - 1.030   pH, UA 5.5 5.0 - 7.5   Color, UA Yellow Yellow   Appearance Ur Clear Clear   Leukocytes,UA Negative Negative   Protein,UA Negative Negative/Trace   Glucose, UA Negative Negative   Ketones, UA Trace (A) Negative   RBC, UA 1+ (A) Negative   Bilirubin, UA Negative Negative  Urobilinogen, Ur 0.2 0.2 - 1.0 mg/dL   Nitrite, UA Negative Negative   Microscopic  Examination See below:       Assessment & Plan:   Problem List Items Addressed This Visit    None    Visit Diagnoses    Nausea vomiting and diarrhea    -  Primary   Improving, unclear etiology. Recommended COVID testing, BRAT diet, prilosec, imodium prn.    Dizziness       Possibly related to sinus issues. Tx with prednisone, flonase, antihistamines. F/u if not improving   Sinus drainage       Prednisone, flonase, antihistamines reviewed. F/u if not improving       Follow up plan: Return if symptoms worsen or fail to improve.

## 2019-06-29 NOTE — Telephone Encounter (Signed)
Patient notified that prednisone was sent in for him and that Nexium was sent in as well since omeprazole was not covered.

## 2019-06-29 NOTE — Telephone Encounter (Signed)
Copied from [name] [id number]. Topic: General - Inquiry >> Jun 29, 2019  3:24 PM Justin [name] wrote: Reason for CRM:  Pt states that he was told an antibiotic was going to be sent in for him but pharmacy won't fill.  Pt doesn't know name of medication, but would like help getting this fixed.

## 2019-06-29 NOTE — Telephone Encounter (Signed)
Fax from pharmacy. Omeprazole is not covered under patient's insurance. Covered medication is Esomeprazole. Change to covered medication or attempt PA?

## 2019-06-29 NOTE — Telephone Encounter (Signed)
Rx changed to nexium

## 2019-06-30 LAB — NOVEL CORONAVIRUS, NAA: SARS-CoV-2, NAA: NOT DETECTED

## 2019-07-02 ENCOUNTER — Telehealth: Payer: Self-pay | Admitting: *Deleted

## 2019-07-02 ENCOUNTER — Other Ambulatory Visit: Payer: Self-pay

## 2019-07-02 ENCOUNTER — Telehealth: Payer: Self-pay

## 2019-07-02 ENCOUNTER — Ambulatory Visit: Payer: 59 | Attending: Pain Medicine | Admitting: Pain Medicine

## 2019-07-02 DIAGNOSIS — M5414 Radiculopathy, thoracic region: Secondary | ICD-10-CM

## 2019-07-02 DIAGNOSIS — Z79899 Other long term (current) drug therapy: Secondary | ICD-10-CM

## 2019-07-02 DIAGNOSIS — R0781 Pleurodynia: Secondary | ICD-10-CM

## 2019-07-02 DIAGNOSIS — R1011 Right upper quadrant pain: Secondary | ICD-10-CM

## 2019-07-02 DIAGNOSIS — G894 Chronic pain syndrome: Secondary | ICD-10-CM

## 2019-07-02 DIAGNOSIS — G8929 Other chronic pain: Secondary | ICD-10-CM

## 2019-07-02 MED ORDER — OXYCODONE HCL 5 MG PO TABS: 5.0000 mg | ORAL_TABLET | Freq: Three times a day (TID) | ORAL | 0 refills | Status: DC | PRN

## 2019-07-02 NOTE — Telephone Encounter (Signed)
Prior Authorization initiated via CoverMyMeds for Esomeprazole Magnesium 40MG  dr capsules Key: [id number]

## 2019-07-06 ENCOUNTER — Ambulatory Visit: Payer: 59 | Admitting: Family Medicine

## 2019-07-09 NOTE — Telephone Encounter (Signed)
Received further documentation to provide for PA.

## 2019-07-25 ENCOUNTER — Ambulatory Visit (INDEPENDENT_AMBULATORY_CARE_PROVIDER_SITE_OTHER): Payer: 59 | Admitting: Family Medicine

## 2019-07-25 ENCOUNTER — Encounter: Payer: Self-pay | Admitting: Family Medicine

## 2019-07-25 VITALS — Ht 71.0 in | Wt 250.0 lb

## 2019-07-25 DIAGNOSIS — M5414 Radiculopathy, thoracic region: Secondary | ICD-10-CM

## 2019-07-25 DIAGNOSIS — T402X5A Adverse effect of other opioids, initial encounter: Secondary | ICD-10-CM

## 2019-07-25 DIAGNOSIS — K5903 Drug induced constipation: Secondary | ICD-10-CM | POA: Diagnosis not present

## 2019-07-25 DIAGNOSIS — E782 Mixed hyperlipidemia: Secondary | ICD-10-CM

## 2019-07-25 DIAGNOSIS — I82511 Chronic embolism and thrombosis of right femoral vein: Secondary | ICD-10-CM

## 2019-07-25 NOTE — Progress Notes (Signed)
Ht 5\' 11"  (1.803 m)   Wt 250 lb (113.4 kg)   BMI 34.87 kg/m    Subjective:    Patient ID: Justin [name], male    DOB: 1965-07-02, 54 y.o.   MRN: [id number]  HPI: BREWER [name] is a 54 y.o. male  Chief Complaint  Patient presents with  . Hyperlipidemia    . This visit was completed via MyChart due to the restrictions of the COVID-19 pandemic. All issues as above were discussed and addressed. Physical exam was done as above through visual confirmation on MyChart. If it was felt that the patient should be evaluated in the office, they were directed there. The patient verbally consented to this visit.  .  . Location of the patient: home . Location of the provider: work . Those involved with this call:  . Provider: Merrie Roof, PA-C . CMA: Lesle Chris, [place] . Front Desk/Registration: Jill Side  . Time spent on call: 25 minutes with patient face to face via video conference. More than 50% of this time was spent in counseling and coordination of care. 5 minutes total spent in review of patient's record and preparation of their chart.  . I verified patient identity using two factors (patient name and date of birth). Patient consents verbally to being seen via telemedicine visit today.   Presenting today for 6 month f/u chronic conditions.   Having some intermittent stomach upset the past few months that seems to be related to his pain medication. This is seeming to be related to foods he's eating and also some bouts of constipation. Has not tried anything for relief. Denies fevers, chills, weight loss, N/V.   Following with Hematology for hx of DVT, on lovenox injections. No new issues there, bleeding or bruising problems.   Taking lipitor for HLD without issue. Tolerating well without side effects, denies CP, SOB, claudication, myalgias. Does not exercise, tries to eat well.   Relevant past medical, surgical, family and social history reviewed and updated as indicated.  Interim medical history since our last visit reviewed. Allergies and medications reviewed and updated.  Review of Systems  Per HPI unless specifically indicated above     Objective:    Ht 5\' 11"  (1.803 m)   Wt 250 lb (113.4 kg)   BMI 34.87 kg/m   Wt Readings from Last 3 Encounters:  07/25/19 250 lb (113.4 kg)  06/29/19 250 lb (113.4 kg)  01/03/19 263 lb 6.4 oz (119.5 kg)    Physical Exam Vitals and nursing note reviewed.  Constitutional:      General: He is not in acute distress.    Appearance: Normal appearance.  HENT:     Head: Atraumatic.     Right Ear: External ear normal.     Left Ear: External ear normal.     Nose: Nose normal. No congestion.     Mouth/Throat:     Mouth: Mucous membranes are moist.     Pharynx: Oropharynx is clear.  Eyes:     Extraocular Movements: Extraocular movements intact.     Conjunctiva/sclera: Conjunctivae normal.  Pulmonary:     Effort: Pulmonary effort is normal. No respiratory distress.  Musculoskeletal:        General: Normal range of motion.     Cervical back: Normal range of motion.  Skin:    General: Skin is dry.     Findings: No erythema or rash.  Neurological:     Mental Status: He is oriented to person,  place, and time.  Psychiatric:        Mood and Affect: Mood normal.        Thought Content: Thought content normal.        Judgment: Judgment normal.     Results for orders placed or performed in visit on 06/29/19  Novel Coronavirus, NAA ([facility])   Specimen: Oropharyngeal(OP) collection in vial transport medium   OROPHARYNGEA  TESTING  Result Value Ref Range   SARS-CoV-2, NAA Not Detected Not Detected      Assessment & Plan:   Problem List Items Addressed This Visit      Cardiovascular and Mediastinum   DVT (deep venous thrombosis) ([place])    Followed by Hematology, continue current regimen per their recommendations        Nervous and Auditory   Thoracic radiculitis (Secondary Area of Pain) (Chronic)     Following with Pain Management for long term pain control, continue current regimen per their recommendations        Other   Mixed hyperlipidemia - Primary    Recheck lipids, adjust as needed. Continue current regimen      Relevant Orders   Lipid Panel w/o Chol/HDL Ratio    Other Visit Diagnoses    Therapeutic opioid induced constipation       Suspect this to be the cause of his abdominal pain. Start amitiza, miralax, push fluids. F/u if not improving       Follow up plan: Return in about 6 months (around 01/22/2020) for CPE.

## 2019-08-02 ENCOUNTER — Other Ambulatory Visit: Payer: Self-pay

## 2019-08-02 ENCOUNTER — Other Ambulatory Visit: Payer: 59

## 2019-08-02 ENCOUNTER — Ambulatory Visit: Payer: 59

## 2019-08-02 DIAGNOSIS — E782 Mixed hyperlipidemia: Secondary | ICD-10-CM

## 2019-08-02 DIAGNOSIS — R7309 Other abnormal glucose: Secondary | ICD-10-CM

## 2019-08-03 LAB — COMPREHENSIVE METABOLIC PANEL
ALT: 28 IU/L (ref 0–44)
AST: 17 IU/L (ref 0–40)
Albumin/Globulin Ratio: 1.3 (ref 1.2–2.2)
Albumin: 3.9 g/dL (ref 3.8–4.9)
Alkaline Phosphatase: 89 IU/L (ref 39–117)
BUN/Creatinine Ratio: 8 — ABNORMAL LOW (ref 9–20)
BUN: 9 mg/dL (ref 6–24)
Bilirubin Total: <0.2 mg/dL (ref 0.0–1.2)
CO2: 23 mmol/L (ref 20–29)
Calcium: 9.6 mg/dL (ref 8.7–10.2)
Chloride: 101 mmol/L (ref 96–106)
Creatinine, Ser: 1.2 mg/dL (ref 0.76–1.27)
GFR calc Af Amer: 79 mL/min/{1.73_m2} (ref >59)
GFR calc non Af Amer: 68 mL/min/{1.73_m2} (ref >59)
Globulin, Total: 3 g/dL (ref 1.5–4.5)
Glucose: 118 mg/dL — ABNORMAL HIGH (ref 65–99)
Potassium: 4.6 mmol/L (ref 3.5–5.2)
Sodium: 142 mmol/L (ref 134–144)
Total Protein: 6.9 g/dL (ref 6.0–8.5)

## 2019-08-03 LAB — LIPID PANEL
Chol/HDL Ratio: 3.3 ratio (ref 0.0–5.0)
Cholesterol, Total: 189 mg/dL (ref 100–199)
HDL: 57 mg/dL (ref >39)
LDL Chol Calc (NIH): 94 mg/dL (ref 0–99)
Triglycerides: 224 mg/dL — ABNORMAL HIGH (ref 0–149)
VLDL Cholesterol Cal: 38 mg/dL (ref 5–40)

## 2019-08-05 NOTE — Assessment & Plan Note (Signed)
Following with Pain Management for long term pain control, continue current regimen per their recommendations

## 2019-08-05 NOTE — Assessment & Plan Note (Signed)
Followed by Hematology, continue current regimen per their recommendations

## 2019-08-05 NOTE — Assessment & Plan Note (Signed)
Recheck lipids, adjust as needed. Continue current regimen 

## 2019-08-06 NOTE — Progress Notes (Signed)
Lvm for to make physical for 6 months

## 2019-08-08 ENCOUNTER — Other Ambulatory Visit: Payer: Self-pay | Admitting: Family Medicine

## 2019-08-08 DIAGNOSIS — R7309 Other abnormal glucose: Secondary | ICD-10-CM

## 2019-08-09 ENCOUNTER — Encounter: Payer: Self-pay | Admitting: Family Medicine

## 2019-08-09 DIAGNOSIS — R7301 Impaired fasting glucose: Secondary | ICD-10-CM | POA: Insufficient documentation

## 2019-08-09 LAB — HEMOGLOBIN A1C
Est. average glucose Bld gHb Est-mCnc: 117 mg/dL
Hgb A1c MFr Bld: 5.7 % — ABNORMAL HIGH (ref 4.8–5.6)

## 2019-08-14 ENCOUNTER — Other Ambulatory Visit: Payer: Self-pay | Admitting: Oncology

## 2019-08-14 ENCOUNTER — Telehealth: Payer: Self-pay | Admitting: Oncology

## 2019-08-14 DIAGNOSIS — I82511 Chronic embolism and thrombosis of right femoral vein: Secondary | ICD-10-CM

## 2019-08-14 NOTE — Telephone Encounter (Signed)
Wife [name] called and said Justin [name]'s right leg inside right knee was swollen and hurting. He takes Lovenox twice a day. He missed his appt with Dr Mike Gip in Sept 2020. Talked with Rulon Abide NP and she ordered an US Venous IMG lower uni right and he will follow up with Dr Mike Gip next week.

## 2019-08-14 NOTE — Progress Notes (Signed)
Re: Right Knee Pain  Has history of recurrent DVT.  Currently on Lovenox.  Denies missing any doses.  Unfortunately, missed last appointment and has not been seen since summer 2020.  We will get him rescheduled to see Dr. Mike Gip next week and get a stat ultrasound of right leg to rule out acute DVT.  Patient in agreement.  Faythe Casa, NP 08/14/2019 2:41 PM

## 2019-08-16 ENCOUNTER — Encounter: Payer: Self-pay | Admitting: Family Medicine

## 2019-08-16 NOTE — Progress Notes (Signed)
Lvm to make this apt. 

## 2019-08-16 NOTE — Progress Notes (Signed)
Sent letter

## 2019-08-17 ENCOUNTER — Other Ambulatory Visit: Payer: Self-pay

## 2019-08-17 ENCOUNTER — Ambulatory Visit
Admission: RE | Admit: 2019-08-17 | Discharge: 2019-08-17 | Disposition: A | Discharge status: Home / Self Care | Payer: 59 | Source: Ambulatory Visit | Attending: Oncology | Admitting: Oncology

## 2019-08-17 DIAGNOSIS — I82511 Chronic embolism and thrombosis of right femoral vein: Secondary | ICD-10-CM

## 2019-08-17 DIAGNOSIS — R760 Raised antibody titer: Secondary | ICD-10-CM

## 2019-08-18 NOTE — Progress Notes (Signed)
[facility]  [address], [address] [place], [place] [address] Phone: [phone]  Fax: [phone]   Clinic Day:  08/21/2019  Referring physician: Volney American,*  Chief Complaint: Justin [name] is a 54 y.o. male with recurrent thrombosis who is seen for a 9 month assessment.   HPI: The patient was last seen in the hematology clinic on 11/14/2018 via telemedicine. At that time, he was doing well. He had chronic rib pain following his surgery. Hematocrit was 45.8, hemoglobin 15.1, platelets 255,000, WBC 6,800. Creatinine 1.29. Patient continued Lovenox every 12 hours.   Patient was seen by Merrie Roof, PA-C on 01/03/2019 for a comprehensive medical examination. His gabapentin was increased to 600 mg BID.   Patient was seen by Merrie Roof, PA-C on 06/29/2019 via WebEx. He noted a headache for 1 week. He felt dizzy and had abdominal cramps. Patient was recommended a COVID-19 test, BRAT diet, prilosec and imodium prn. COVID-19 testing was negative. Patient was diagnosed with the stomach flu.   Patient spoke with Faythe Casa, NP on 08/14/2019 secondary to right knee pain.  Right unilateral venous ultrasound on 08/17/2019 showed no evidence of acute or chronic DVT within the right lower extremity.   During the interim, he has been doing "just fine". He reports chronic rib pain. He remains on Lovenox. She notes his right knee is swollen and sore. He reports the swelling in his right knee has decreased some. He notes a few weeks ago his whole right knee was swollen and radiated to his calf. He had associated right elbow pain. He elevated his leg and the swelling went down some. Denies being short or breath or chest pain. He has left hip pain after walking for long distances.     Patient will see his PCP in a few months. I advised patient to have a colonoscopy secondary to his age. Patient agreed to a GI consult.    Past Medical History:  Diagnosis Date    . Arm vein blood clot    right  . Coronary artery disease   . DVT (deep venous thrombosis) ([place]) 2012; January 2016   History of right arm; now right lower shotty  . GERD (gastroesophageal reflux disease)   . Rib pain on right side 08/04/2016   RT rib pain that radiates around to the back. Level 8 of 10.  Constant. Started 1 mo ago, 1 week after DVT.  Marland Kitchen Vascular thoracic outlet syndrome    Right     Past Surgical History:  Procedure Laterality Date  . CAROTID ANGIOGRAM    . KNEE CARTILAGE SURGERY     Performed for cracked patella and torn meniscus  . Right upper extremity thrombectomy  2012  . SCALENOTOMY W/O RESECTION CERVICAL RIB  2012   4 right-sided thoracic outlet syndrome    Family History  Problem Relation Age of Onset  . Heart disease Father   . Breast cancer Father 106  . Diabetes Father   . Heart attack Father   . Cancer Maternal Grandmother   . Leukemia Maternal Grandfather   . Prostate cancer Maternal Uncle     Social History:  reports that he has quit smoking. He smoked 0.50 packs per day. He has never used smokeless tobacco. He reports that he does not drink alcohol or use drugs. He stopped smoking in his 32s. He lives in [place]. He is a Glass blower/designer. He denies any exposure to radiation or toxins. He has a son.  The patient's wife is Justin [name]. The patient is alone today.  Allergies: No Known Allergies  Current Medications: Current Outpatient Medications  Medication Sig Dispense Refill  . acetaminophen (TYLENOL) 500 MG tablet Take 1,000 mg by mouth every 6 (six) hours as needed.    Marland Kitchen atorvastatin (LIPITOR) 20 MG tablet Take 1 tablet (20 mg total) by mouth daily. 90 tablet 1  . Calcium Carbonate-Vit D-Min (GNP CALCIUM 1200) 1200-1000 MG-UNIT CHEW Chew 1,200 mg by mouth daily with breakfast. Take in combination with vitamin D and magnesium. 90 tablet 3  . enoxaparin (LOVENOX) 150 MG/ML injection INJECT 0.74 MLS (110 MG TOTAL) INTO THE SKIN 2 (TWO) TIMES  DAILY 133.2 mL 3  . gabapentin (NEURONTIN) 600 MG tablet Take 1 tablet (600 mg total) by mouth 2 (two) times daily. 180 tablet 1  . ibuprofen (ADVIL,MOTRIN) 800 MG tablet Take 800 mg by mouth every 8 (eight) hours as needed.    Marland Kitchen oxyCODONE (OXY IR/ROXICODONE) 5 MG immediate release tablet Take 1 tablet (5 mg total) by mouth every 8 (eight) hours as needed for severe pain. Must last 30 days 90 tablet 0  . oxyCODONE (OXY IR/ROXICODONE) 5 MG immediate release tablet Take 1 tablet (5 mg total) by mouth every 8 (eight) hours as needed for severe pain. Must last 30 days (Patient not taking: Reported on 07/25/2019) 90 tablet 0  . [START ON 09/02/2019] oxyCODONE (OXY IR/ROXICODONE) 5 MG immediate release tablet Take 1 tablet (5 mg total) by mouth every 8 (eight) hours as needed for severe pain. Must last 30 days (Patient not taking: Reported on 07/25/2019) 90 tablet 0   No current facility-administered medications for this visit.    Review of Systems  Constitutional: Positive for weight loss (9 pounds since 07/14/2018). Negative for chills, diaphoresis, fever and malaise/fatigue.       Doing "just fine".  HENT: Negative.  Negative for congestion, ear pain, nosebleeds, sinus pain and sore throat.   Eyes: Negative.  Negative for blurred vision, double vision and photophobia.  Respiratory: Negative.  Negative for hemoptysis, sputum production and shortness of breath.   Cardiovascular: Positive for leg swelling (right knee radiating to calf; improving). Negative for chest pain, palpitations and PND.  Gastrointestinal: Negative.  Negative for abdominal pain, blood in stool, constipation, diarrhea, heartburn, melena, nausea and vomiting.  Genitourinary: Negative.  Negative for dysuria, frequency, hematuria and urgency.  Musculoskeletal: Positive for joint pain (LEFT hip; when walking long distances; right elbow). Negative for back pain, falls, myalgias and neck pain.       RIGHT lateral chest wall pain, chronic.   Skin: Negative.  Negative for itching and rash.  Neurological: Negative.  Negative for dizziness, tremors, focal weakness, weakness and headaches.  Endo/Heme/Allergies: Bruises/bleeds easily (on enoxaparin injections).  Psychiatric/Behavioral: Negative.  Negative for depression and memory loss. The patient is not nervous/anxious and does not have insomnia.   All other systems reviewed and are negative.  Performance status (ECOG): 1  Vitals Blood pressure 135/73, pulse 77, temperature (!) 96.6 F (35.9 C), temperature source Tympanic, height 5\' 11"  (1.803 m), weight 257 lb 15 oz (117 kg), SpO2 100 %.   Physical Exam Vitals and nursing note reviewed.  Constitutional:      General: He is not in acute distress.    Appearance: Normal appearance. He is well-developed and well-nourished. He is not diaphoretic.  HENT:     Head: Normocephalic and atraumatic.     Mouth/Throat:     Mouth: Oropharynx is clear  and moist.     Pharynx: No oropharyngeal exudate.      Comments: Wearing a light blue [facility] cap.  Short graying hair. Mask. Eyes:     General: No scleral icterus.    Extraocular Movements: EOM normal.     Conjunctiva/sclera: Conjunctivae normal.     Pupils: Pupils are equal, round, and reactive to light.     Comments: Glasses. Brown eyes.  Cardiovascular:     Rate and Rhythm: Normal rate and regular rhythm.     Heart sounds: Normal heart sounds. No murmur heard.   Pulmonary:     Effort: Pulmonary effort is normal. No respiratory distress.     Breath sounds: Normal breath sounds. No wheezing or rales.  Chest:     Chest wall: No tenderness.  Abdominal:     General: Bowel sounds are normal. There is no distension.     Palpations: Abdomen is soft. There is no mass.     Tenderness: There is no abdominal tenderness. There is no guarding or rebound.  Musculoskeletal:        General: No edema. Normal range of motion.     Cervical back: Normal range of motion and neck supple.     Right  knee: Tenderness present.  Lymphadenopathy:     Head:     Right side of head: No preauricular, posterior auricular or occipital adenopathy.     Left side of head: No preauricular, posterior auricular or occipital adenopathy.     Cervical: No cervical adenopathy.     Upper Body:  No axillary adenopathy present.    Right upper body: No supraclavicular adenopathy.     Left upper body: No supraclavicular adenopathy.     Lower Body: No right inguinal adenopathy. No left inguinal adenopathy.  Skin:    General: Skin is warm and dry.     Coloration: Skin is not pale.     Findings: No erythema or rash.  Neurological:     Mental Status: He is alert and oriented to person, place, and time.  Psychiatric:        Mood and Affect: Mood and affect normal.        Behavior: Behavior normal.        Thought Content: Thought content normal.        Judgment: Judgment normal.    Appointment on 08/21/2019  Component Date Value Ref Range Status  . WBC 08/21/2019 9.2  4.0 - 10.5 K/uL Final  . RBC 08/21/2019 4.88  4.22 - 5.81 MIL/uL Final  . Hemoglobin 08/21/2019 14.7  13.0 - 17.0 g/dL Final  . HCT 08/21/2019 46.3  39.0 - 52.0 % Final  . MCV 08/21/2019 94.9  80.0 - 100.0 fL Final  . MCH 08/21/2019 30.1  26.0 - 34.0 pg Final  . MCHC 08/21/2019 31.7  30.0 - 36.0 g/dL Final  . RDW 08/21/2019 12.9  11.5 - 15.5 % Final  . Platelets 08/21/2019 266  150 - 400 K/uL Final  . nRBC 08/21/2019 0.0  0.0 - 0.2 % Final  . Neutrophils Relative % 08/21/2019 56  % Final  . Neutro Abs 08/21/2019 5.1  1.7 - 7.7 K/uL Final  . Lymphocytes Relative 08/21/2019 33  % Final  . Lymphs Abs 08/21/2019 3.0  0.7 - 4.0 K/uL Final  . Monocytes Relative 08/21/2019 8  % Final  . Monocytes Absolute 08/21/2019 0.7  0.1 - 1.0 K/uL Final  . Eosinophils Relative 08/21/2019 3  % Final  . Eosinophils Absolute  08/21/2019 0.3  0.0 - 0.5 K/uL Final  . Basophils Relative 08/21/2019 0  % Final  . Basophils Absolute 08/21/2019 0.0  0.0 - 0.1  K/uL Final  . Immature Granulocytes 08/21/2019 0  % Final  . Abs Immature Granulocytes 08/21/2019 0.04  0.00 - 0.07 K/uL Final   Performed at [facility], [address], [place], [place] [address]    Assessment:  JAYDIEL HOLTHAUS is a 54 y.o. male with recurrent thrombosisx 4. He developed a right upper extremity clotin 2010. He underwent thrombolysis. He was on Coumadin x 6 months. He developed a recurrent clot in the right upper extremityin 2011. He underwent thoracic outlet decompression. He was on Coumadin for 6-9 months. He developed a right lower extremity DVTin 2016. He was started on Xarelto.  He developed aleft upper extremity DVTon 06/07/2016 while on Xarelto. Duplex revealed a near occlusive DVT in the lateral aspect of the left subclavian vein and nonocclusive thrombus extending into the adjacent axillary vein. He was started on Lovenox.  Hypercoagulable work-upon 06/29/2016 revealed the following normal studies: CBC with diff, Factor V Leiden, prothrombin gene mutation, protein C antigen and activity, protein S antigen and activity, ATIII antigen and activity, anticardiolipin antibodies and beta-2 glycoprotein antibodies. Lupus anticoagulant panelwas positive on Xarelto and Lovenox.  Chest CT angiogramon 07/02/2016 revealed no evidence of pulmonary embolism. There were small pulmonary nodules, largest 3 mm, of unclear significance.  Bone scan on 08/04/2016 revealed a focus of moderately increased uptakeat approximately the T9right paravertebral region. Chest CTon 07/20/2016 revealed a large right sided osteophyte at the T9-T10 level which was felt to explain the increased right paravertebral uptake uptake. There was also mildly increased uptake in the body of L2. Review of the lumbar spine on the abdominal CT scan of the same date revealed no definite radiographic abnormality in the body of L2 to explain the mildly increased uptake. There was  no abnormal uptake within the ribs or elsewhere within the skeleton.  Thoracic spine MRIon 09/14/2016 revealed mild degenerative disc disease in the thoracic spine with non-compressive disc bulges at T5-6 and below. There was no disc herniation or compressive stenosis of the canal or foramina. 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 On the right at T9-10, there was fairly bulky osteophytesemanating from the right costovertebral articulation. There was no evidence of neural compression secondary to this or the other bony degenerative changes. There was ordinary mild facet osteoarthritis without edematous change or significant hypertrophy.  He underwent right lateral mini-thoracotomyfor resection of a bony rib lesion on 01/14/2017. Notes indicate he underwent thoracic discectomy with decompression by anterior approach. Pathology revealed an "exophytic lesion". He has had right sided rib pain post-operatively.  Bone densityon 12/06/2017 revealed a normal T score of -0.6 in the right femoral neck.  Symptomatically, he feels "fine".  He has had some issues with knee pain.  Lower extremity duplex revealed no DVT.  Exam is stable.  Plan: 1.   Labs today: CBC with diff, CMP, lupus anticoagulant, anticardiolipin antibodies, beta2-glycoprotein antibodies. 2.   Recurrent thrombosis Clinically, he is doing well without recurrent thrombosis. He continues on Lovenox SQ every 12 hours.  Additional hypercoagulable testing today.  Review need for bone density testing every 2 years given risk of bone thinning with long-term Lovenox use  3.   Patient to contact clinic if recurrent right lower extremity swelling, any unusual pain or GI symptoms. 4.  Bone density 12/07/2019. 5.   GI consult- colonoscopy. 6.   RTC in 3 months for MD assessment and labs (CBC with diff, CMP).  I discussed the assessment and treatment plan with  the patient.  The patient was provided an opportunity to ask questions and all were answered.  The patient agreed with the plan and demonstrated an understanding of the instructions.  The patient was advised to call back if the symptoms worsen or if the condition fails to improve as anticipated.  I provided 16 minutes of face-to-face time during this encounter and > 50% was spent counseling as documented under my assessment and plan.  An additional 5-8 minutes were spent reviewing his chart (Epic and Care Everywhere) including notes, labs, and imaging studies.    Lequita Asal, MD, PhD    08/21/2019, 9:06 AM  I, Selena Batten, am acting as scribe for [employer]. Mike Gip, MD, PhD.  I, Magdelyn Roebuck C. Mike Gip, MD, have reviewed the above documentation for accuracy and completeness, and I agree with the above.

## 2019-08-20 ENCOUNTER — Other Ambulatory Visit: Payer: Self-pay

## 2019-08-20 NOTE — Progress Notes (Signed)
Confirmed Name and DOB. Patient reports approx. 2 weeks. He started having edema and pain in right knee cap area. Reports he elevated area and applied heat pack. [place] was completed 1 week later and patient states it was negative for DVT. Reports left of knee is still slightly edematous and painful but denies any other concerns.

## 2019-08-21 ENCOUNTER — Encounter: Payer: Self-pay | Admitting: Hematology and Oncology

## 2019-08-21 ENCOUNTER — Inpatient Hospital Stay: Payer: 59 | Attending: Hematology and Oncology | Admitting: Hematology and Oncology

## 2019-08-21 ENCOUNTER — Inpatient Hospital Stay: Payer: 59

## 2019-08-21 VITALS — BP 135/73 | HR 77 | Temp 96.6°F | Ht 71.0 in | Wt 257.9 lb

## 2019-08-21 DIAGNOSIS — I82A21 Chronic embolism and thrombosis of right axillary vein: Secondary | ICD-10-CM

## 2019-08-21 DIAGNOSIS — R760 Raised antibody titer: Secondary | ICD-10-CM

## 2019-08-21 DIAGNOSIS — Z86711 Personal history of pulmonary embolism: Secondary | ICD-10-CM | POA: Insufficient documentation

## 2019-08-21 DIAGNOSIS — Z86718 Personal history of other venous thrombosis and embolism: Secondary | ICD-10-CM | POA: Diagnosis not present

## 2019-08-21 DIAGNOSIS — Z7901 Long term (current) use of anticoagulants: Secondary | ICD-10-CM

## 2019-08-21 DIAGNOSIS — Z0000 Encounter for general adult medical examination without abnormal findings: Secondary | ICD-10-CM | POA: Diagnosis not present

## 2019-08-21 DIAGNOSIS — I82B12 Acute embolism and thrombosis of left subclavian vein: Secondary | ICD-10-CM

## 2019-08-21 DIAGNOSIS — I82511 Chronic embolism and thrombosis of right femoral vein: Secondary | ICD-10-CM

## 2019-08-21 LAB — CBC WITH DIFFERENTIAL/PLATELET
Abs Immature Granulocytes: 0.04 10*3/uL (ref 0.00–0.07)
Basophils Absolute: 0 10*3/uL (ref 0.0–0.1)
Basophils Relative: 0 %
Eosinophils Absolute: 0.3 10*3/uL (ref 0.0–0.5)
Eosinophils Relative: 3 %
HCT: 46.3 % (ref 39.0–52.0)
Hemoglobin: 14.7 g/dL (ref 13.0–17.0)
Immature Granulocytes: 0 %
Lymphocytes Relative: 33 %
Lymphs Abs: 3 10*3/uL (ref 0.7–4.0)
MCH: 30.1 pg (ref 26.0–34.0)
MCHC: 31.7 g/dL (ref 30.0–36.0)
MCV: 94.9 fL (ref 80.0–100.0)
Monocytes Absolute: 0.7 10*3/uL (ref 0.1–1.0)
Monocytes Relative: 8 %
Neutro Abs: 5.1 10*3/uL (ref 1.7–7.7)
Neutrophils Relative %: 56 %
Platelets: 266 10*3/uL (ref 150–400)
RBC: 4.88 MIL/uL (ref 4.22–5.81)
RDW: 12.9 % (ref 11.5–15.5)
WBC: 9.2 10*3/uL (ref 4.0–10.5)
nRBC: 0 % (ref 0.0–0.2)

## 2019-08-21 LAB — COMPREHENSIVE METABOLIC PANEL
ALT: 35 U/L (ref 0–44)
AST: 21 U/L (ref 15–41)
Albumin: 3.9 g/dL (ref 3.5–5.0)
Alkaline Phosphatase: 81 U/L (ref 38–126)
Anion gap: 13 (ref 5–15)
BUN: 11 mg/dL (ref 6–20)
CO2: 22 mmol/L (ref 22–32)
Calcium: 9.2 mg/dL (ref 8.9–10.3)
Chloride: 103 mmol/L (ref 98–111)
Creatinine, Ser: 1.36 mg/dL — ABNORMAL HIGH (ref 0.61–1.24)
GFR calc Af Amer: >60 mL/min (ref >60)
GFR calc non Af Amer: 59 mL/min — ABNORMAL LOW (ref >60)
Glucose, Bld: 141 mg/dL — ABNORMAL HIGH (ref 70–99)
Potassium: 3.8 mmol/L (ref 3.5–5.1)
Sodium: 138 mmol/L (ref 135–145)
Total Bilirubin: 0.3 mg/dL (ref 0.3–1.2)
Total Protein: 7.4 g/dL (ref 6.5–8.1)

## 2019-08-22 LAB — BETA-2-GLYCOPROTEIN I ABS, IGG/M/A
Beta-2 Glyco I IgG: <9 GPI IgG units (ref 0–20)
Beta-2-Glycoprotein I IgA: <9 GPI IgA units (ref 0–25)
Beta-2-Glycoprotein I IgM: <9 GPI IgM units (ref 0–32)

## 2019-08-22 LAB — CARDIOLIPIN ANTIBODIES, IGG, IGM, IGA
Anticardiolipin IgA: <9 APL U/mL (ref 0–11)
Anticardiolipin IgG: <9 GPL U/mL (ref 0–14)
Anticardiolipin IgM: <9 MPL U/mL (ref 0–12)

## 2019-08-23 ENCOUNTER — Telehealth: Payer: Self-pay

## 2019-08-23 LAB — LUPUS ANTICOAGULANT PANEL
DRVVT: 50.3 s — ABNORMAL HIGH (ref 0.0–47.0)
PTT Lupus Anticoagulant: 41.8 s (ref 0.0–51.9)

## 2019-08-23 LAB — DRVVT CONFIRM: dRVVT Confirm: 1.3 ratio — ABNORMAL HIGH (ref 0.8–1.2)

## 2019-08-23 LAB — DRVVT MIX: dRVVT Mix: 40.5 s — ABNORMAL HIGH (ref 0.0–40.4)

## 2019-08-23 NOTE — Telephone Encounter (Signed)
Returned patients call to schedule his colonoscopy.  LVM for him to call back.  Thanks, [employer]

## 2019-09-04 ENCOUNTER — Encounter: Payer: Self-pay | Admitting: Nurse Practitioner

## 2019-09-04 ENCOUNTER — Ambulatory Visit (INDEPENDENT_AMBULATORY_CARE_PROVIDER_SITE_OTHER): Payer: 59 | Admitting: Nurse Practitioner

## 2019-09-04 ENCOUNTER — Other Ambulatory Visit: Payer: Self-pay

## 2019-09-04 VITALS — BP 110/74 | HR 74 | Temp 98.6°F | Wt 258.0 lb

## 2019-09-04 DIAGNOSIS — M5442 Lumbago with sciatica, left side: Secondary | ICD-10-CM | POA: Diagnosis not present

## 2019-09-04 MED ORDER — PREDNISONE 10 MG PO TABS: ORAL_TABLET | ORAL | 0 refills | Status: DC

## 2019-09-04 MED ORDER — CYCLOBENZAPRINE HCL 10 MG PO TABS: 10.0000 mg | ORAL_TABLET | Freq: Three times a day (TID) | ORAL | 0 refills | Status: DC | PRN

## 2019-09-04 MED ORDER — KETOROLAC TROMETHAMINE 60 MG/2ML IM SOLN
60.0000 mg | Freq: Once | INTRAMUSCULAR | Status: AC
  Administered 2019-09-04: 60 mg via INTRAMUSCULAR

## 2019-09-04 NOTE — Assessment & Plan Note (Signed)
Acute on chronic back pain today, sciatic flare.  Will order imaging of lumbar spine and recommend he obtain.  Toradol shot in office today.  Scripts for Prednisone taper and Flexeril sent.  Recommend he continue alternating heat and ice at home + ensure plenty of rest.  Currently followed by pain management for chronic pain, may benefit from addition of Duloxetine in future for ongoing sciatic discomfort, currently takes Gabapentin.  Discussed with patient.  Return for worsening or ongoing symptoms.  Discussed red flag symptoms to be seen immediately for in ER setting.

## 2019-09-04 NOTE — Progress Notes (Signed)
BP 110/74   Pulse 74   Temp 98.6 F (37 C) (Oral)   Wt 258 lb (117 kg)   SpO2 93%   BMI 35.98 kg/m    Subjective:    Patient ID: Justin [name], male    DOB: September 17, 1965, 54 y.o.   MRN: [id number]  HPI: Justin [name] is a 54 y.o. male  Chief Complaint  Patient presents with  . Back Pain    left lower back since last Tuesday. pt states what bending over to get his shoes on  . Leg Pain    left leg   BACK PAIN Happened last Tuesday.  Got up around 9 am and moved a few boxes, laid back down and felt fine, but when he bent over to put on socks and shoes he tweaked his back.  Has had similar before, last two months ago.  Has been treated in past with muscle relaxer, Prednisone, and "shot in the office".   Pain is in left lower back, radiates around into groin, and then goes down left leg.  Similar to past episodes of lower back pain.  No recent imaging noted of lumbar spine  On review does see chronic pain management last in October 2020, this is for chronic thoracic back pain.  Has Oxycodone at home which helps pain to lower back get to 7-8/10 for a couple hours.  Takes Gabapentin 600 MG BID. Duration: days Mechanism of injury: lifting Location: left lower back Onset: gradual Severity: 10/10 Quality: aching, throbbing Frequency: constant Radiation: L leg above the knee Aggravating factors: lifting, movement and bending Alleviating factors: rest, heat, NSAIDs and narcotics Status: fluctuating Treatments attempted: rest, heat, ibuprofen and aleve  Relief with NSAIDs?: mild Nighttime pain:  no Paresthesias / decreased sensation:  no Bowel / bladder incontinence:  no Fevers:  no Dysuria / urinary frequency:  no  Relevant past medical, surgical, family and social history reviewed and updated as indicated. Interim medical history since our last visit reviewed. Allergies and medications reviewed and updated.  Review of Systems  Constitutional: Negative for activity  change, diaphoresis, fatigue and fever.  Respiratory: Negative for cough, chest tightness, shortness of breath and wheezing.   Cardiovascular: Negative for chest pain, palpitations and leg swelling.  Gastrointestinal: Negative.   Musculoskeletal: Positive for back pain.  Neurological: Negative.   Psychiatric/Behavioral: Negative.     Per HPI unless specifically indicated above     Objective:    BP 110/74   Pulse 74   Temp 98.6 F (37 C) (Oral)   Wt 258 lb (117 kg)   SpO2 93%   BMI 35.98 kg/m   Wt Readings from Last 3 Encounters:  09/04/19 258 lb (117 kg)  08/21/19 257 lb 15 oz (117 kg)  07/25/19 250 lb (113.4 kg)    Physical Exam Vitals and nursing note reviewed.  Constitutional:      General: He is awake. He is not in acute distress.    Appearance: He is well-developed. He is obese. He is not ill-appearing.  HENT:     Head: Normocephalic and atraumatic.     Right Ear: Hearing normal. No drainage.     Left Ear: Hearing normal. No drainage.  Eyes:     General: Lids are normal.        Right eye: No discharge.        Left eye: No discharge.     Conjunctiva/sclera: Conjunctivae normal.     Pupils: Pupils are equal, round,  and reactive to light.  Cardiovascular:     Rate and Rhythm: Normal rate and regular rhythm.     Heart sounds: Normal heart sounds, S1 normal and S2 normal. No murmur. No gallop.   Pulmonary:     Effort: Pulmonary effort is normal. No accessory muscle usage or respiratory distress.     Breath sounds: Normal breath sounds.  Abdominal:     General: Bowel sounds are normal.     Palpations: Abdomen is soft.  Musculoskeletal:     Cervical back: Normal range of motion and neck supple.     Lumbar back: Tenderness present. No swelling, edema, signs of trauma or bony tenderness. Decreased range of motion.     Right lower leg: No edema.     Left lower leg: No edema.     Comments: Antalgic gait present.  Skin:    General: Skin is warm and dry.      Capillary Refill: Capillary refill takes less than 2 seconds.     Findings: No rash.  Neurological:     Mental Status: He is alert and oriented to person, place, and time.     Deep Tendon Reflexes: Reflexes are normal and symmetric.     Reflex Scores:      Brachioradialis reflexes are 2+ on the right side and 2+ on the left side.      Patellar reflexes are 2+ on the right side and 2+ on the left side. Psychiatric:        Attention and Perception: Attention normal.        Mood and Affect: Mood normal.        Speech: Speech normal.        Behavior: Behavior normal. Behavior is cooperative.        Thought Content: Thought content normal.    Back Exam:    Inspection:  Normal spinal curvature.  No deformity, ecchymosis, erythema, or lesions   Curvature: Normal   Deformity: no  Ecchymosis: none  Erythema: none  Lesions: none    Palpation:     Midline spinal tenderness: none              Paralumbar tenderness: yes Left     Parathoracic tenderness: none     Buttocks tenderness: none     Range of Motion:      Flexion: Fingers to Knees     Extension:Decreased     Lateral bending:Decreased    Rotation:Decreased    Neuro Exam:Lower extremity DTRs normal & symmetric.  Strength and sensation intact.     Patellar DTRs: normal  Patient unable to lie down for exam on table, in discomfort with movement.  Results for orders placed or performed in visit on 08/21/19  Cardiolipin antibodies, IgG, IgM, IgA  Result Value Ref Range   Anticardiolipin IgG <9 0 - 14 GPL U/mL   Anticardiolipin IgM <9 0 - 12 MPL U/mL   Anticardiolipin IgA <9 0 - 11 APL U/mL  Beta-2-glycoprotein i abs, IgG/M/A  Result Value Ref Range   Beta-2 Glyco I IgG <9 0 - 20 GPI IgG units   Beta-2-Glycoprotein I IgM <9 0 - 32 GPI IgM units   Beta-2-Glycoprotein I IgA <9 0 - 25 GPI IgA units  Lupus anticoagulant panel  Result Value Ref Range   PTT Lupus Anticoagulant 41.8 0.0 - 51.9 sec   DRVVT 50.3 (H) 0.0 - 47.0 sec    Lupus Anticoag Interp Comment:   Comprehensive metabolic panel  Result Value  Ref Range   Sodium 138 135 - 145 mmol/L   Potassium 3.8 3.5 - 5.1 mmol/L   Chloride 103 98 - 111 mmol/L   CO2 22 22 - 32 mmol/L   Glucose, Bld 141 (H) 70 - 99 mg/dL   BUN 11 6 - 20 mg/dL   Creatinine, Ser 1.36 (H) 0.61 - 1.24 mg/dL   Calcium 9.2 8.9 - 10.3 mg/dL   Total Protein 7.4 6.5 - 8.1 g/dL   Albumin 3.9 3.5 - 5.0 g/dL   AST 21 15 - 41 U/L   ALT 35 0 - 44 U/L   Alkaline Phosphatase 81 38 - 126 U/L   Total Bilirubin 0.3 0.3 - 1.2 mg/dL   GFR calc non Af Amer 59 (L) >60 mL/min   GFR calc Af Amer >60 >60 mL/min   Anion gap 13 5 - 15  CBC with Differential/Platelet  Result Value Ref Range   WBC 9.2 4.0 - 10.5 K/uL   RBC 4.88 4.22 - 5.81 MIL/uL   Hemoglobin 14.7 13.0 - 17.0 g/dL   HCT 46.3 39.0 - 52.0 %   MCV 94.9 80.0 - 100.0 fL   MCH 30.1 26.0 - 34.0 pg   MCHC 31.7 30.0 - 36.0 g/dL   RDW 12.9 11.5 - 15.5 %   Platelets 266 150 - 400 K/uL   nRBC 0.0 0.0 - 0.2 %   Neutrophils Relative % 56 %   Neutro Abs 5.1 1.7 - 7.7 K/uL   Lymphocytes Relative 33 %   Lymphs Abs 3.0 0.7 - 4.0 K/uL   Monocytes Relative 8 %   Monocytes Absolute 0.7 0.1 - 1.0 K/uL   Eosinophils Relative 3 %   Eosinophils Absolute 0.3 0.0 - 0.5 K/uL   Basophils Relative 0 %   Basophils Absolute 0.0 0.0 - 0.1 K/uL   Immature Granulocytes 0 %   Abs Immature Granulocytes 0.04 0.00 - 0.07 K/uL  dRVVT Mix  Result Value Ref Range   dRVVT Mix 40.5 (H) 0.0 - 40.4 sec  dRVVT Confirm  Result Value Ref Range   dRVVT Confirm 1.3 (H) 0.8 - 1.2 ratio      Assessment & Plan:   Problem List Items Addressed This Visit      Nervous and Auditory   Left-sided low back pain with left-sided sciatica - Primary    Acute on chronic back pain today, sciatic flare.  Will order imaging of lumbar spine and recommend he obtain.  Toradol shot in office today.  Scripts for Prednisone taper and Flexeril sent.  Recommend he continue alternating heat and  ice at home + ensure plenty of rest.  Currently followed by pain management for chronic pain, may benefit from addition of Duloxetine in future for ongoing sciatic discomfort, currently takes Gabapentin.  Discussed with patient.  Return for worsening or ongoing symptoms.  Discussed red flag symptoms to be seen immediately for in ER setting.      Relevant Medications   predniSONE (DELTASONE) 10 MG tablet   cyclobenzaprine (FLEXERIL) 10 MG tablet   Other Relevant Orders   DG Lumbar Spine Complete       Follow up plan: Return if symptoms worsen or fail to improve.

## 2019-09-04 NOTE — Patient Instructions (Addendum)
IMAGING: [id number], [place], [place] [address]  DULOXETINE   Chronic Back Pain When back pain lasts longer than 3 months, it is called chronic back pain. Pain may get worse at certain times (flare-ups). There are things you can do at home to manage your pain. Follow these instructions at home: Activity      Avoid bending and other activities that make pain worse.  When standing: ? Keep your upper back and neck straight. ? Keep your shoulders pulled back. ? Avoid slouching.  When sitting: ? Keep your back straight. ? Relax your shoulders. Do not round your shoulders or pull them backward.  Do not sit or stand in one place for long periods of time.  Take short rest breaks during the day. Lying down or standing is usually better than sitting. Resting can help relieve pain.  When sitting or lying down for a long time, do some mild activity or stretching. This will help to prevent stiffness and pain.  Get regular exercise. Ask your doctor what activities are safe for you.  Do not lift anything that is heavier than 10 lb (4.5 kg). To prevent injury when you lift things: ? Bend your knees. ? Keep the weight close to your body. ? Avoid twisting. Managing pain  If told, put ice on the painful area. Your doctor may tell you to use ice for 24-48 hours after a flare-up starts. ? Put ice in a plastic bag. ? Place a towel between your skin and the bag. ? Leave the ice on for 20 minutes, 2-3 times a day.  If told, put heat on the painful area as often as told by your doctor. Use the heat source that your doctor recommends, such as a moist heat pack or a heating pad. ? Place a towel between your skin and the heat source. ? Leave the heat on for 20-30 minutes. ? Remove the heat if your skin turns bright red. This is especially important if you are unable to feel pain, heat, or cold. You may have a greater risk of getting burned.  Soak in a warm bath. This can help relieve  pain.  Take over-the-counter and prescription medicines only as told by your doctor. General instructions  Sleep on a firm mattress. Try lying on your side with your knees slightly bent. If you lie on your back, put a pillow under your knees.  Keep all follow-up visits as told by your doctor. This is important. Contact a doctor if:  You have pain that does not get better with rest or medicine. Get help right away if:  One or both of your arms or legs feel weak.  One or both of your arms or legs lose feeling (numbness).  You have trouble controlling when you poop (bowel movement) or pee (urinate).  You feel sick to your stomach (nauseous).  You throw up (vomit).  You have belly (abdominal) pain.  You have shortness of breath.  You pass out (faint). Summary  When back pain lasts longer than 3 months, it is called chronic back pain.  Pain may get worse at certain times (flare-ups).  Use ice and heat as told by your doctor. Your doctor may tell you to use ice after flare-ups. This information is not intended to replace advice given to you by your health care provider. Make sure you discuss any questions you have with your health care provider. Document Revised: 09/14/2018 Document Reviewed: 01/06/2017 Elsevier Patient Education  [phone]  [employer].

## 2019-09-12 ENCOUNTER — Ambulatory Visit (INDEPENDENT_AMBULATORY_CARE_PROVIDER_SITE_OTHER): Payer: Self-pay | Admitting: Gastroenterology

## 2019-09-12 DIAGNOSIS — Z1211 Encounter for screening for malignant neoplasm of colon: Secondary | ICD-10-CM

## 2019-09-12 MED ORDER — PEG 3350-KCL-NA BICARB-NACL 420 G PO SOLR: 4000.0000 mL | Freq: Once | ORAL | 0 refills | Status: AC

## 2019-09-12 NOTE — Progress Notes (Signed)
Gastroenterology Pre-Procedure Review  Request Date: Wed 10/10/19 Requesting Physician: Dr. Vicente Males  PATIENT REVIEW QUESTIONS: The patient responded to the following health history questions as indicated:    1. Are you having any GI issues? yes (frequent upset stomach) 2. Do you have a personal history of Polyps? no 3. Do you have a family history of Colon Cancer or Polyps? no 4. Diabetes Mellitus? no 5. Joint replacements in the past 12 months?no 6. Major health problems in the past 3 months?yes (blood clots) 7. Any artificial heart valves, MVP, or defibrillator?no    MEDICATIONS & ALLERGIES:    Patient reports the following regarding taking any anticoagulation/antiplatelet therapy:   Plavix, Coumadin, Eliquis, Xarelto, Lovenox, Pradaxa, Brilinta, or Effient? Lovenox Aspirin? yes (Lovenox Blood thinner request sent to Dr. Mike Gip)  Patient confirms/reports the following medications:  Current Outpatient Medications  Medication Sig Dispense Refill  . acetaminophen (TYLENOL) 500 MG tablet Take 1,000 mg by mouth every 6 (six) hours as needed.    Marland Kitchen atorvastatin (LIPITOR) 20 MG tablet Take 1 tablet (20 mg total) by mouth daily. 90 tablet 1  . Calcium Carbonate-Vit D-Min (GNP CALCIUM 1200) 1200-1000 MG-UNIT CHEW Chew 1,200 mg by mouth daily with breakfast. Take in combination with vitamin D and magnesium. 90 tablet 3  . cyclobenzaprine (FLEXERIL) 10 MG tablet Take 1 tablet (10 mg total) by mouth 3 (three) times daily as needed for muscle spasms. 30 tablet 0  . enoxaparin (LOVENOX) 150 MG/ML injection INJECT 0.74 MLS (110 MG TOTAL) INTO THE SKIN 2 (TWO) TIMES DAILY 133.2 mL 3  . gabapentin (NEURONTIN) 600 MG tablet Take 1 tablet (600 mg total) by mouth 2 (two) times daily. 180 tablet 1  . ibuprofen (ADVIL,MOTRIN) 800 MG tablet Take 800 mg by mouth every 8 (eight) hours as needed.    Marland Kitchen oxyCODONE (OXY IR/ROXICODONE) 5 MG immediate release tablet Take 1 tablet (5 mg total) by mouth every 8 (eight)  hours as needed for severe pain. Must last 30 days 90 tablet 0  . oxyCODONE (OXY IR/ROXICODONE) 5 MG immediate release tablet Take 1 tablet (5 mg total) by mouth every 8 (eight) hours as needed for severe pain. Must last 30 days (Patient not taking: Reported on 07/25/2019) 90 tablet 0  . oxyCODONE (OXY IR/ROXICODONE) 5 MG immediate release tablet Take 1 tablet (5 mg total) by mouth every 8 (eight) hours as needed for severe pain. Must last 30 days 90 tablet 0  . predniSONE (DELTASONE) 10 MG tablet Take 6 tablets by mouth daily for 2 days, then reduce by 1 tablet every 2 days until gone 42 tablet 0   No current facility-administered medications for this visit.    Patient confirms/reports the following allergies:  No Known Allergies  No orders of the defined types were placed in this encounter.   AUTHORIZATION INFORMATION Primary Insurance: 1D#: Group #:  Secondary Insurance: 1D#: Group #:  SCHEDULE INFORMATION: Date: Wed 10/10/19 Time: Location:[facility]

## 2019-09-27 ENCOUNTER — Telehealth: Payer: Self-pay | Admitting: *Deleted

## 2019-09-27 ENCOUNTER — Telehealth: Payer: Self-pay | Admitting: Pain Medicine

## 2019-09-27 NOTE — Telephone Encounter (Signed)
Attempted to call for pre appointment review of allergies/meds. Message left. 

## 2019-09-27 NOTE — Telephone Encounter (Signed)
Returned call for Monday appt. Chart update

## 2019-09-28 ENCOUNTER — Telehealth: Payer: Self-pay | Admitting: Pain Medicine

## 2019-09-28 NOTE — Telephone Encounter (Signed)
Attempted to call patient back to go over pre virtual appointment questions.  LM to call us back.

## 2019-09-28 NOTE — Progress Notes (Signed)
Patient: Justin [name]  Service Category: E/M  Provider: Gaspar Cola, MD  DOB: 1966-03-18  DOS: 10/01/2019  Location: Office  MRN: [id number]  Setting: Ambulatory outpatient  Referring Provider: Volney American,*  Type: Established Patient  Specialty: Interventional Pain Management  PCP: Volney American, PA-C  Location: Remote location  Delivery: TeleHealth     Virtual Encounter - Pain Management PROVIDER NOTE: Information contained herein reflects review and annotations entered in association with encounter. Interpretation of such information and data should be left to medically-trained personnel. Information provided to patient can be located elsewhere in the medical record under "Patient Instructions". Document created using STT-dictation technology, any transcriptional errors that may result from process are unintentional.    Contact & Pharmacy Preferred: [phone] Home: [phone] (home) Mobile: [phone] (mobile) E-mail: [email or web address]  [facility] [place], [place] [address] [place] [address] Phone: [phone] Fax: [phone]   Pre-screening  Mr. Bures offered "in-person" vs "virtual" encounter. He indicated preferring virtual for this encounter.   Reason COVID-19*  Social distancing based on CDC and AMA recommendations.   I contacted [name] on 10/01/2019 via telephone.      I clearly identified myself as Gaspar Cola, MD. I verified that I was speaking with the correct person using two identifiers (Name: AQUAN KOPE, and date of birth: 10-06-1965).  Consent I sought verbal advanced consent from [name] for virtual visit interactions. I informed Mr. Abruzzo of possible security and privacy concerns, risks, and limitations associated with providing "not-in-person" medical evaluation and management services. I also informed Mr. Wilson of the availability of "in-person"  appointments. Finally, I informed him that there would be a charge for the virtual visit and that he could be  personally, fully or partially, financially responsible for it. Mr. Rudin expressed understanding and agreed to proceed.   Historic Elements   Mr. RYKKER COVIELLO is a 54 y.o. year old, male patient evaluated today after his last contact with our practice on 09/27/2019. Mr. Sedano  has a past medical history of Arm vein blood clot, Coronary artery disease, DVT (deep venous thrombosis) ([place]) (2012; January 2016), GERD (gastroesophageal reflux disease), Rib pain on right side (08/04/2016), and Vascular thoracic outlet syndrome. He also  has a past surgical history that includes Knee cartilage surgery; Carotid angiogram; Scalenotomy w/o resection cervical rib (2012); and Right upper extremity thrombectomy (2012). Mr. Gales has a current medication list which includes the following prescription(s): acetaminophen, atorvastatin, gnp calcium 1200, cyclobenzaprine, enoxaparin, gabapentin, ibuprofen, prednisone, [START ON 10/02/2019] oxycodone, [START ON 11/01/2019] oxycodone, and [START ON 12/01/2019] oxycodone. He  reports that he has quit smoking. He smoked 0.50 packs per day. He has never used smokeless tobacco. He reports that he does not drink alcohol or use drugs. Mr. Ludlam has No Known Allergies.   HPI  Today, he is being contacted for medication management.  The patient indicates doing well with the current medication regimen. No adverse reactions or side effects reported to the medications.  Today the patient has elected to [facility], from [facility] that he was using before, due to the unreliability of [facility] in having adequate stocks of opioid analgesics for the community.  Today I also informed the patient that we will be ordering a UDS and that the next refill will probably be a face-to-face refill as we resume normal scheduled  visits.  Pharmacotherapy  Assessment  Analgesic: OxycodoneIR5 mg,1 tab PO q 8 hrs(79m/day of oxycodone) MME/day:22.577mday.   Monitoring: [place] PMP: PDMP reviewed during this encounter.       Pharmacotherapy: No side-effects or adverse reactions reported. Compliance: No problems identified. Effectiveness: Clinically acceptable. Plan: Refer to "POC".  UDS:  Summary  Date Value Ref Range Status  06/29/2018 FINAL  Final    Comment:    ==================================================================== TOXASSURE SELECT 13 (MW) ==================================================================== Test                             Result       Flag       Units Drug Present and Declared for Prescription Verification   Oxycodone                      1316         EXPECTED   ng/mg creat   Oxymorphone                    681          EXPECTED   ng/mg creat   Noroxycodone                   942          EXPECTED   ng/mg creat   Noroxymorphone                 98           EXPECTED   ng/mg creat    Sources of oxycodone are scheduled prescription medications.    Oxymorphone, noroxycodone, and noroxymorphone are expected    metabolites of oxycodone. Oxymorphone is also available as a    scheduled prescription medication. ==================================================================== Test                      Result    Flag   Units      Ref Range   Creatinine              345              mg/dL      >=20 ==================================================================== Declared Medications:  The flagging and interpretation on this report are based on the  following declared medications.  Unexpected results may arise from  inaccuracies in the declared medications.  **Note: The testing scope of this panel includes these medications:  Oxycodone  **Note: The testing scope of this panel does not include following  reported medications:  Acetaminophen (Tylenol)  Atorvastatin  (Lipitor)  Calcium  Enoxaparin (Lovenox)  Gabapentin (Neurontin)  Ibuprofen ==================================================================== For clinical consultation, please call ([phone]====================================================================    Laboratory Chemistry Profile   Renal Lab Results  Component Value Date   BUN 11 08/21/2019   CREATININE 1.36 (H) 08/21/2019   BCR 8 (L) 08/02/2019   GFRAA >60 08/21/2019   GFRNONAA 59 (L) 08/21/2019     Hepatic Lab Results  Component Value Date   AST 21 08/21/2019   ALT 35 08/21/2019   ALBUMIN 3.9 08/21/2019   ALKPHOS 81 08/21/2019   LIPASE 90 03/02/2014     Electrolytes Lab Results  Component Value Date   NA 138 08/21/2019   K 3.8 08/21/2019   CL 103 08/21/2019   CALCIUM 9.2 08/21/2019   MG 2.0 07/28/2017     Bone Lab Results  Component Value Date   25OHVITD1 15 (L)  06/29/2018   25OHVITD2 8.4 06/29/2018   25OHVITD3 6.8 06/29/2018     Inflammation (CRP: Acute Phase) (ESR: Chronic Phase) Lab Results  Component Value Date   CRP 12 (H) 06/29/2018   ESRSEDRATE 27 06/29/2018   LATICACIDVEN 0.9 07/20/2016       Note: Above Lab results reviewed.  Imaging  US Venous Img Lower Unilateral Right CLINICAL DATA:  Right lower extremity pain and edema. History of smoking and previous DVT. Evaluate for acute or chronic DVT.  EXAM: RIGHT LOWER EXTREMITY VENOUS DOPPLER ULTRASOUND  TECHNIQUE: Gray-scale sonography with graded compression, as well as color Doppler and duplex ultrasound were performed to evaluate the lower extremity deep venous systems from the level of the common femoral vein and including the common femoral, femoral, profunda femoral, popliteal and calf veins including the posterior tibial, peroneal and gastrocnemius veins when visible. The superficial great saphenous vein was also interrogated. Spectral Doppler was utilized to evaluate flow at rest and with distal augmentation  maneuvers in the common femoral, femoral and popliteal veins.  COMPARISON:  None.  FINDINGS: Contralateral Common Femoral Vein: Respiratory phasicity is normal and symmetric with the symptomatic side. No evidence of thrombus. Normal compressibility.  Common Femoral Vein: No evidence of thrombus. Normal compressibility, respiratory phasicity and response to augmentation.  Saphenofemoral Junction: No evidence of thrombus. Normal compressibility and flow on color Doppler imaging.  Profunda Femoral Vein: No evidence of thrombus. Normal compressibility and flow on color Doppler imaging.  Femoral Vein: No evidence of thrombus. Normal compressibility, respiratory phasicity and response to augmentation.  Popliteal Vein: No evidence of thrombus. Normal compressibility, respiratory phasicity and response to augmentation.  Calf Veins: No evidence of thrombus. Normal compressibility and flow on color Doppler imaging.  Superficial Great Saphenous Vein: No evidence of thrombus. Normal compressibility.  Venous Reflux:  None.  Other Findings:  None.  IMPRESSION: No evidence of acute or chronic DVT within the right lower extremity.  Electronically Signed   By: Sandi Mariscal M.D.   On: 08/17/2019 15:00  Assessment  The primary encounter diagnosis was Chronic pain syndrome. Diagnoses of Chronic thoracic back pain (Primary Area of Pain) (Right), Thoracic radiculitis (Secondary Area of Pain), Right upper quadrant abdominal pain (Tertiary Area of Pain), Rib pain on right side (Fourth Area of Pain), and Pharmacologic therapy were also pertinent to this visit.  Plan of Care  Problem-specific:  No problem-specific Assessment & Plan notes found for this encounter.  Mr. BIRAN MAYBERRY has a current medication list which includes the following long-term medication(s): atorvastatin, gnp calcium 1200, enoxaparin, gabapentin, [START ON 10/02/2019] oxycodone, [START ON 11/01/2019] oxycodone, and  [START ON 12/01/2019] oxycodone.  Pharmacotherapy (Medications Ordered): Meds ordered this encounter  Medications  . oxyCODONE (OXY IR/ROXICODONE) 5 MG immediate release tablet    Sig: Take 1 tablet (5 mg total) by mouth every 8 (eight) hours as needed for severe pain. Must last 30 days    Dispense:  90 tablet    Refill:  0    Chronic Pain: STOP Act (Not applicable) Fill 1 day early if closed on refill date. Do not fill until: 10/02/2019. To last until: 11/01/2019. Avoid benzodiazepines within 8 hours of opioids  . oxyCODONE (OXY IR/ROXICODONE) 5 MG immediate release tablet    Sig: Take 1 tablet (5 mg total) by mouth every 8 (eight) hours as needed for severe pain. Must last 30 days    Dispense:  90 tablet    Refill:  0    Chronic Pain:  STOP Act (Not applicable) Fill 1 day early if closed on refill date. Do not fill until: 11/01/2019. To last until: 12/01/2019. Avoid benzodiazepines within 8 hours of opioids  . oxyCODONE (OXY IR/ROXICODONE) 5 MG immediate release tablet    Sig: Take 1 tablet (5 mg total) by mouth every 8 (eight) hours as needed for severe pain. Must last 30 days    Dispense:  90 tablet    Refill:  0    Chronic Pain: STOP Act (Not applicable) Fill 1 day early if closed on refill date. Do not fill until: 12/01/2019. To last until: 12/31/2019. Avoid benzodiazepines within 8 hours of opioids   Orders:  Orders Placed This Encounter  Procedures  . ToxASSURE Select 13 (MW), Urine    Volume: 30 ml(s). Minimum 3 ml of urine is needed. Document temperature of fresh sample. Indications: Long term (current) use of opiate analgesic (L45.625)    Order Specific Question:   Release to patient    Answer:   Immediate   Follow-up plan:   Return in about 3 months (around 12/31/2019) for (F2F), (MM).      Interventional management options:  Considering:   Diagnostic right thoracicSNRB Diagnosticright thoracicfacetblock Possible right thoracic facet medial branch RFA   Palliative  PRN treatment(s):   Palliative right T6, T7, T8, &T9intercostal nerve block  Palliative right T6, T7, T8, &T9intercostal nerve RFA #2 (Last done 12/29/17)       Recent Visits No visits were found meeting these conditions.  Showing recent visits within past 90 days and meeting all other requirements   Today's Visits Date Type Provider Dept  10/01/19 Telemedicine Milinda Pointer, MD [facility]  Showing today's visits and meeting all other requirements   Future Appointments No visits were found meeting these conditions.  Showing future appointments within next 90 days and meeting all other requirements   I discussed the assessment and treatment plan with the patient. The patient was provided an opportunity to ask questions and all were answered. The patient agreed with the plan and demonstrated an understanding of the instructions.  Patient advised to call back or seek an in-person evaluation if the symptoms or condition worsens.  Duration of encounter: 12 minutes.  Note by: Gaspar Cola, MD Date: 10/01/2019; Time: 12:54 PM

## 2019-09-28 NOTE — Telephone Encounter (Signed)
Pt returned nurse call to go over meds for vv Monday.

## 2019-09-28 NOTE — Telephone Encounter (Signed)
Patients wife has been advised to have her husband stop Lovenox one day prior to colonoscopy.  Restart one day after as long as cleared by GI.  Per Dr. Kem Parkinson blood thinner request received on 09/12/19.  Thanks,  [place], [place]

## 2019-10-01 ENCOUNTER — Ambulatory Visit: Payer: 59 | Attending: Pain Medicine | Admitting: Pain Medicine

## 2019-10-01 ENCOUNTER — Other Ambulatory Visit: Payer: Self-pay

## 2019-10-01 DIAGNOSIS — G894 Chronic pain syndrome: Secondary | ICD-10-CM | POA: Diagnosis not present

## 2019-10-01 DIAGNOSIS — M546 Pain in thoracic spine: Secondary | ICD-10-CM

## 2019-10-01 DIAGNOSIS — M5414 Radiculopathy, thoracic region: Secondary | ICD-10-CM | POA: Diagnosis not present

## 2019-10-01 DIAGNOSIS — G8929 Other chronic pain: Secondary | ICD-10-CM

## 2019-10-01 DIAGNOSIS — Z79899 Other long term (current) drug therapy: Secondary | ICD-10-CM

## 2019-10-01 DIAGNOSIS — R1011 Right upper quadrant pain: Secondary | ICD-10-CM

## 2019-10-01 DIAGNOSIS — R0781 Pleurodynia: Secondary | ICD-10-CM

## 2019-10-01 MED ORDER — OXYCODONE HCL 5 MG PO TABS: 5.0000 mg | ORAL_TABLET | Freq: Three times a day (TID) | ORAL | 0 refills | Status: DC | PRN

## 2019-10-08 ENCOUNTER — Other Ambulatory Visit: Payer: Self-pay

## 2019-10-08 ENCOUNTER — Other Ambulatory Visit
Admission: RE | Admit: 2019-10-08 | Discharge: 2019-10-08 | Disposition: A | Discharge status: Home / Self Care | Payer: 59 | Source: Ambulatory Visit | Attending: Gastroenterology | Admitting: Gastroenterology

## 2019-10-08 ENCOUNTER — Telehealth: Payer: Self-pay

## 2019-10-08 DIAGNOSIS — Z20822 Contact with and (suspected) exposure to covid-19: Secondary | ICD-10-CM | POA: Diagnosis not present

## 2019-10-08 DIAGNOSIS — Z01812 Encounter for preprocedural laboratory examination: Secondary | ICD-10-CM | POA: Insufficient documentation

## 2019-10-08 LAB — TOXASSURE SELECT 13 (MW), URINE

## 2019-10-08 LAB — SARS CORONAVIRUS 2 (TAT 6-24 HRS): SARS Coronavirus 2: NEGATIVE

## 2019-10-08 NOTE — Telephone Encounter (Signed)
Spoke with the patient to if he has been told to stop the Lovenox today. The patient informed me that he was told by GI to stop the Lovenox after today and doin't start back until Thursday Oct 11, 2019.I also informed the patient , Per Dr Mike Gip she would like to draw Lupus anticoagulant panel.while he is off the Lovenox. The patient was agreeable and has been schedule for tomorrow 10/09/2019 at 1:00pm. The patient was understanding.

## 2019-10-09 ENCOUNTER — Inpatient Hospital Stay: Payer: 59 | Attending: Hematology and Oncology

## 2019-10-09 ENCOUNTER — Other Ambulatory Visit: Payer: Self-pay

## 2019-10-09 DIAGNOSIS — R760 Raised antibody titer: Secondary | ICD-10-CM

## 2019-10-09 DIAGNOSIS — Z86711 Personal history of pulmonary embolism: Secondary | ICD-10-CM | POA: Diagnosis not present

## 2019-10-09 DIAGNOSIS — Z7901 Long term (current) use of anticoagulants: Secondary | ICD-10-CM | POA: Diagnosis not present

## 2019-10-09 DIAGNOSIS — Z86718 Personal history of other venous thrombosis and embolism: Secondary | ICD-10-CM | POA: Insufficient documentation

## 2019-10-10 ENCOUNTER — Ambulatory Visit: Payer: 59 | Admitting: Anesthesiology

## 2019-10-10 ENCOUNTER — Other Ambulatory Visit: Payer: Self-pay

## 2019-10-10 ENCOUNTER — Encounter: Payer: Self-pay | Admitting: Gastroenterology

## 2019-10-10 ENCOUNTER — Encounter
Admission: RE | Disposition: A | Discharge status: Home / Self Care | Payer: Self-pay | Source: Home / Self Care | Attending: Gastroenterology

## 2019-10-10 ENCOUNTER — Ambulatory Visit
Admission: RE | Admit: 2019-10-10 | Discharge: 2019-10-10 | Disposition: A | Discharge status: Home / Self Care | Payer: 59 | Attending: Gastroenterology | Admitting: Gastroenterology

## 2019-10-10 DIAGNOSIS — Z833 Family history of diabetes mellitus: Secondary | ICD-10-CM | POA: Diagnosis not present

## 2019-10-10 DIAGNOSIS — Z79899 Other long term (current) drug therapy: Secondary | ICD-10-CM | POA: Diagnosis not present

## 2019-10-10 DIAGNOSIS — K219 Gastro-esophageal reflux disease without esophagitis: Secondary | ICD-10-CM | POA: Insufficient documentation

## 2019-10-10 DIAGNOSIS — Z7901 Long term (current) use of anticoagulants: Secondary | ICD-10-CM | POA: Insufficient documentation

## 2019-10-10 DIAGNOSIS — Z7952 Long term (current) use of systemic steroids: Secondary | ICD-10-CM | POA: Diagnosis not present

## 2019-10-10 DIAGNOSIS — Z8042 Family history of malignant neoplasm of prostate: Secondary | ICD-10-CM | POA: Diagnosis not present

## 2019-10-10 DIAGNOSIS — I2510 Atherosclerotic heart disease of native coronary artery without angina pectoris: Secondary | ICD-10-CM | POA: Diagnosis not present

## 2019-10-10 DIAGNOSIS — Z803 Family history of malignant neoplasm of breast: Secondary | ICD-10-CM | POA: Insufficient documentation

## 2019-10-10 DIAGNOSIS — G540 Brachial plexus disorders: Secondary | ICD-10-CM | POA: Diagnosis not present

## 2019-10-10 DIAGNOSIS — Z806 Family history of leukemia: Secondary | ICD-10-CM | POA: Diagnosis not present

## 2019-10-10 DIAGNOSIS — G709 Myoneural disorder, unspecified: Secondary | ICD-10-CM | POA: Diagnosis not present

## 2019-10-10 DIAGNOSIS — Z809 Family history of malignant neoplasm, unspecified: Secondary | ICD-10-CM | POA: Insufficient documentation

## 2019-10-10 DIAGNOSIS — Z1211 Encounter for screening for malignant neoplasm of colon: Secondary | ICD-10-CM | POA: Diagnosis not present

## 2019-10-10 DIAGNOSIS — Z791 Long term (current) use of non-steroidal anti-inflammatories (NSAID): Secondary | ICD-10-CM | POA: Insufficient documentation

## 2019-10-10 DIAGNOSIS — Z87891 Personal history of nicotine dependence: Secondary | ICD-10-CM | POA: Insufficient documentation

## 2019-10-10 DIAGNOSIS — Z86718 Personal history of other venous thrombosis and embolism: Secondary | ICD-10-CM | POA: Insufficient documentation

## 2019-10-10 DIAGNOSIS — Z8249 Family history of ischemic heart disease and other diseases of the circulatory system: Secondary | ICD-10-CM | POA: Insufficient documentation

## 2019-10-10 HISTORY — PX: COLONOSCOPY WITH PROPOFOL: SHX5780

## 2019-10-10 SURGERY — COLONOSCOPY WITH PROPOFOL
Anesthesia: General

## 2019-10-10 MED ORDER — SODIUM CHLORIDE 0.9 % IV SOLN: INTRAVENOUS | Status: DC

## 2019-10-10 MED ORDER — PROPOFOL 10 MG/ML IV BOLUS
INTRAVENOUS | Status: DC | PRN
  Administered 2019-10-10: 100 mg via INTRAVENOUS

## 2019-10-10 MED ORDER — PROPOFOL 500 MG/50ML IV EMUL
INTRAVENOUS | Status: DC | PRN
  Administered 2019-10-10: 200 ug/kg/min via INTRAVENOUS

## 2019-10-10 NOTE — Transfer of Care (Signed)
Immediate Anesthesia Transfer of Care Note  Patient: Justin [name]  Procedure(s) Performed: COLONOSCOPY WITH PROPOFOL (N/A )  Patient Location: PACU  Anesthesia Type:General  Level of Consciousness: awake, alert  and oriented  Airway & Oxygen Therapy: Patient Spontanous Breathing and Patient connected to nasal cannula oxygen  Post-op Assessment: Report given to RN and Post -op Vital signs reviewed and stable  Post vital signs: Reviewed and stable  Last Vitals:  Vitals Value Taken Time  BP 113/67 10/10/19 1318  Temp    Pulse 81 10/10/19 1320  Resp 25 10/10/19 1320  SpO2 100 % 10/10/19 1320  Vitals shown include unvalidated device data.  Last Pain:  Vitals:   10/10/19 1230  TempSrc: Tympanic  PainSc: 7          Complications: No apparent anesthesia complications

## 2019-10-10 NOTE — Anesthesia Postprocedure Evaluation (Signed)
Anesthesia Post Note  Patient: Justin [name]  Procedure(s) Performed: COLONOSCOPY WITH PROPOFOL (N/A )  Patient location during evaluation: Endoscopy Anesthesia Type: General Level of consciousness: awake and alert Pain management: pain level controlled Vital Signs Assessment: post-procedure vital signs reviewed and stable Respiratory status: spontaneous breathing, nonlabored ventilation, respiratory function stable and patient connected to nasal cannula oxygen Cardiovascular status: blood pressure returned to baseline and stable Postop Assessment: no apparent nausea or vomiting Anesthetic complications: no     Last Vitals:  Vitals:   10/10/19 1337 10/10/19 1345  BP: 108/89 113/80  Pulse: (!) 56 (!) 55  Resp: 19 19  Temp:    SpO2: 100% 100%    Last Pain:  Vitals:   10/10/19 1345  TempSrc:   PainSc: 0-No pain                 Precious Haws Monico Sudduth

## 2019-10-10 NOTE — Anesthesia Procedure Notes (Signed)
Date/Time: 10/10/2019 1:05 PM Performed by: Nelda Marseille, CRNA Pre-anesthesia Checklist: Patient identified, Emergency Drugs available, Suction available, Patient being monitored and Timeout performed Oxygen Delivery Method: Nasal cannula

## 2019-10-10 NOTE — Anesthesia Preprocedure Evaluation (Signed)
Anesthesia Evaluation  Patient identified by MRN, date of birth, ID band Patient awake    Reviewed: Allergy & Precautions, H&P , NPO status , Patient's Chart, lab work & pertinent test results  History of Anesthesia Complications Negative for: history of anesthetic complications  Airway Mallampati: III  TM Distance: >3 FB Neck ROM: limited    Dental  (+) Chipped, Poor Dentition   Pulmonary neg shortness of breath, former smoker,  Signs and symptoms suggestive of sleep apnea     Pulmonary exam normal        Cardiovascular Exercise Tolerance: Good (-) angina+ CAD  (-) Past MI and (-) DOE Normal cardiovascular exam     Neuro/Psych  Neuromuscular disease negative psych ROS   GI/Hepatic Neg liver ROS, GERD  Medicated and Controlled,  Endo/Other  negative endocrine ROS  Renal/GU negative Renal ROS  negative genitourinary   Musculoskeletal   Abdominal   Peds  Hematology negative hematology ROS (+)   Anesthesia Other Findings Past Medical History: No date: Arm vein blood clot     Comment:  right No date: Coronary artery disease 2012; January 2016: DVT (deep venous thrombosis) ([facility])     Comment:  History of right arm; now right lower shotty No date: GERD (gastroesophageal reflux disease) 08/04/2016: Rib pain on right side     Comment:  RT rib pain that radiates around to the back. Level 8 of              10.  Constant. Started 1 mo ago, 1 week after DVT. No date: Vascular thoracic outlet syndrome     Comment:  Right   Past Surgical History: No date: CAROTID ANGIOGRAM No date: KNEE CARTILAGE SURGERY     Comment:  Performed for cracked patella and torn meniscus 2012: Right upper extremity thrombectomy 2012: SCALENOTOMY W/O RESECTION CERVICAL RIB     Comment:  4 right-sided thoracic outlet syndrome  BMI    Body Mass Index: 34.87 kg/m      Reproductive/Obstetrics negative OB ROS                              Anesthesia Physical Anesthesia Plan  ASA: III  Anesthesia Plan: General   Post-op Pain Management:    Induction: Intravenous  PONV Risk Score and Plan: Propofol infusion and TIVA  Airway Management Planned: Natural Airway and Nasal Cannula  Additional Equipment:   Intra-op Plan:   Post-operative Plan:   Informed Consent: I have reviewed the patients History and Physical, chart, labs and discussed the procedure including the risks, benefits and alternatives for the proposed anesthesia with the patient or authorized representative who has indicated his/her understanding and acceptance.     Dental Advisory Given  Plan Discussed with: Anesthesiologist, CRNA and Surgeon  Anesthesia Plan Comments: (Patient consented for risks of anesthesia including but not limited to:  - adverse reactions to medications - risk of intubation if required - damage to eyes, teeth, lips or other oral mucosa - nerve damage due to positioning  - sore throat or hoarseness - Damage to heart, brain, nerves, lungs or loss of life  Patient voiced understanding.)        Anesthesia Quick Evaluation

## 2019-10-10 NOTE — Op Note (Signed)
[facility] Gastroenterology Patient Name: Justin [name] Procedure Date: 10/10/2019 12:55 PM MRN: [id number] Account #: [id number] Date of Birth: 05/27/1966 Admit Type: Outpatient Age: 54 Room: [facility] ENDO ROOM 2 Gender: Male Note Status: Finalized Procedure:             Colonoscopy Indications:           Screening for colorectal malignant neoplasm Providers:             Jonathon Bellows MD, MD Referring MD:          Volney American (Referring MD) Medicines:             Monitored Anesthesia Care Complications:         No immediate complications. Procedure:             Pre-Anesthesia Assessment:                        - Prior to the procedure, a History and Physical was                         performed, and patient medications, allergies and                         sensitivities were reviewed. The patient's tolerance                         of previous anesthesia was reviewed.                        - The risks and benefits of the procedure and the                         sedation options and risks were discussed with the                         patient. All questions were answered and informed                         consent was obtained.                        - ASA Grade Assessment: II - A patient with mild                         systemic disease.                        After obtaining informed consent, the colonoscope was                         passed under direct vision. Throughout the procedure,                         the patient's blood pressure, pulse, and oxygen                         saturations were monitored continuously. The                         Colonoscope was introduced through the anus and  advanced to the the cecum, identified by appendiceal                         orifice and ileocecal valve. The colonoscopy was                         performed with ease. The patient tolerated the                         procedure  well. The quality of the bowel preparation                         was excellent. Findings:      The perianal and digital rectal examinations were normal.      The entire examined colon appeared normal on direct and retroflexion       views. Impression:            - The entire examined colon is normal on direct and                         retroflexion views.                        - No specimens collected. Recommendation:        - Discharge patient to home (with escort).                        - Resume previous diet.                        - Continue present medications.                        - Repeat colonoscopy in 10 years for screening                         purposes. Procedure Code(s):     --- Professional ---                        [id number], Colonoscopy, flexible; diagnostic, including                         collection of specimen(s) by brushing or washing, when                         performed (separate procedure) Diagnosis Code(s):     --- Professional ---                        Z12.11, Encounter for screening for malignant neoplasm                         of colon CPT copyright 2019 American Medical Association. All rights reserved. The codes documented in this report are preliminary and upon coder review may  be revised to meet current compliance requirements. Jonathon Bellows, MD Jonathon Bellows MD, MD 10/10/2019 1:14:31 PM This report has been signed electronically. Number of Addenda: 0 Note Initiated On: 10/10/2019 12:55 PM Scope Withdrawal Time: 0 hours 8 minutes 42 seconds  Total Procedure Duration: 0 hours 11 minutes 1  second  Estimated Blood Loss:  Estimated blood loss: none.      [facility]

## 2019-10-10 NOTE — H&P (Signed)
Jonathon Bellows, MD [address] [place], [place], [place], [address] [id number], [place], [place], [place], [address] Phone: [phone]  Fax: [phone]  Primary Care Physician:  Volney American, PA-C   Pre-Procedure History & Physical: HPI:  Justin [name] is a 54 y.o. male is here for an colonoscopy.   Past Medical History:  Diagnosis Date  . Arm vein blood clot    right  . Coronary artery disease   . DVT (deep venous thrombosis) ([place]) 2012; January 2016   History of right arm; now right lower shotty  . GERD (gastroesophageal reflux disease)   . Rib pain on right side 08/04/2016   RT rib pain that radiates around to the back. Level 8 of 10.  Constant. Started 1 mo ago, 1 week after DVT.  Marland Kitchen Vascular thoracic outlet syndrome    Right     Past Surgical History:  Procedure Laterality Date  . CAROTID ANGIOGRAM    . KNEE CARTILAGE SURGERY     Performed for cracked patella and torn meniscus  . Right upper extremity thrombectomy  2012  . SCALENOTOMY W/O RESECTION CERVICAL RIB  2012   4 right-sided thoracic outlet syndrome    Prior to Admission medications   Medication Sig Start Date End Date Taking? Authorizing Provider  acetaminophen (TYLENOL) 500 MG tablet Take 1,000 mg by mouth every 6 (six) hours as needed.   Yes [provider]  atorvastatin (LIPITOR) 20 MG tablet Take 1 tablet (20 mg total) by mouth daily. 01/04/19  Yes Volney American, PA-C  Calcium Carbonate-Vit D-Min [facility] CALCIUM 1200) 1200-1000 MG-UNIT CHEW Chew 1,200 mg by mouth daily with breakfast. Take in combination with vitamin D and magnesium. 03/28/19 03/27/20 Yes Milinda Pointer, MD  cyclobenzaprine (FLEXERIL) 10 MG tablet Take 1 tablet (10 mg total) by mouth 3 (three) times daily as needed for muscle spasms. 09/04/19  Yes Cannady, Jolene T, NP  enoxaparin (LOVENOX) 150 MG/ML injection INJECT 0.74 MLS (110 MG TOTAL) INTO THE SKIN 2 (TWO) TIMES DAILY 12/31/18 12/31/19 Yes  Corcoran, Drue Second, MD  gabapentin (NEURONTIN) 600 MG tablet Take 1 tablet (600 mg total) by mouth 2 (two) times daily. 01/03/19  Yes Volney American, PA-C  ibuprofen (ADVIL,MOTRIN) 800 MG tablet Take 800 mg by mouth every 8 (eight) hours as needed.   Yes [provider]  oxyCODONE (OXY IR/ROXICODONE) 5 MG immediate release tablet Take 1 tablet (5 mg total) by mouth every 8 (eight) hours as needed for severe pain. Must last 30 days 10/02/19 11/01/19 Yes Milinda Pointer, MD  oxyCODONE (OXY IR/ROXICODONE) 5 MG immediate release tablet Take 1 tablet (5 mg total) by mouth every 8 (eight) hours as needed for severe pain. Must last 30 days 11/01/19 12/01/19 Yes Milinda Pointer, MD  oxyCODONE (OXY IR/ROXICODONE) 5 MG immediate release tablet Take 1 tablet (5 mg total) by mouth every 8 (eight) hours as needed for severe pain. Must last 30 days 12/01/19 12/31/19 Yes Milinda Pointer, MD  predniSONE (DELTASONE) 10 MG tablet Take 6 tablets by mouth daily for 2 days, then reduce by 1 tablet every 2 days until gone 09/04/19  Yes Cannady, Jolene T, NP    Allergies as of 09/13/2019  . (No Known Allergies)    Family History  Problem Relation Age of Onset  . Heart disease Father   . Breast cancer Father 58  . Diabetes Father   . Heart attack Father   . Cancer Maternal Grandmother   .  Leukemia Maternal Grandfather   . Prostate cancer Maternal Uncle     Social History   Socioeconomic History  . Marital status: Married    Spouse name: Not on file  . Number of children: Not on file  . Years of education: Not on file  . Highest education level: Not on file  Occupational History    Comment: [employer]  Tobacco Use  . Smoking status: Former Smoker    Packs/day: 0.50  . Smokeless tobacco: Never Used  . Tobacco comment: quit smoking in 2010  Substance and Sexual Activity  . Alcohol use: No  . Drug use: No  . Sexual activity: Yes    Birth control/protection: None  Other Topics Concern  .  Not on file  Social History Narrative   He works full-time at [employer].    He is married. Nonsmoker who is not currently. Alcohol.   Social Determinants of Health   Financial Resource Strain:   . Difficulty of Paying Living Expenses:   Food Insecurity:   . Worried About Charity fundraiser in the Last Year:   . Arboriculturist in the Last Year:   Transportation Needs:   . Film/video editor (Medical):   Marland Kitchen Lack of Transportation (Non-Medical):   Physical Activity:   . Days of Exercise per Week:   . Minutes of Exercise per Session:   Stress:   . Feeling of Stress :   Social Connections:   . Frequency of Communication with Friends and Family:   . Frequency of Social Gatherings with Friends and Family:   . Attends Religious Services:   . Active Member of Clubs or Organizations:   . Attends Archivist Meetings:   Marland Kitchen Marital Status:   Intimate Partner Violence:   . Fear of Current or Ex-Partner:   . Emotionally Abused:   Marland Kitchen Physically Abused:   . Sexually Abused:     Review of Systems: See HPI, otherwise negative ROS  Physical Exam: BP 115/80   Pulse 72   Temp 98.1 F (36.7 C) (Tympanic)   Resp 16   Ht 5\' 11"  (1.803 m)   Wt 113.4 kg   SpO2 100%   BMI 34.87 kg/m  General:   Alert,  pleasant and cooperative in NAD Head:  Normocephalic and atraumatic. Neck:  Supple; no masses or thyromegaly. Lungs:  Clear throughout to auscultation, normal respiratory effort.    Heart:  +S1, +S2, Regular rate and rhythm, No edema. Abdomen:  Soft, nontender and nondistended. Normal bowel sounds, without guarding, and without rebound.   Neurologic:  Alert and  oriented x4;  grossly normal neurologically.  Impression/Plan: Justin [name] is here for an colonoscopy to be performed for Screening colonoscopy average risk   Risks, benefits, limitations, and alternatives regarding  colonoscopy have been reviewed with the patient.  Questions have been answered.  All parties  agreeable.   Jonathon Bellows, MD  10/10/2019, 12:52 PM

## 2019-10-11 ENCOUNTER — Encounter: Payer: Self-pay | Admitting: *Deleted

## 2019-10-11 LAB — DRVVT MIX: dRVVT Mix: 41.1 s — ABNORMAL HIGH (ref 0.0–40.4)

## 2019-10-11 LAB — DRVVT CONFIRM: dRVVT Confirm: 1.3 ratio — ABNORMAL HIGH (ref 0.8–1.2)

## 2019-10-11 LAB — LUPUS ANTICOAGULANT PANEL
DRVVT: 48.1 s — ABNORMAL HIGH (ref 0.0–47.0)
PTT Lupus Anticoagulant: 39 s (ref 0.0–51.9)

## 2019-11-19 NOTE — Progress Notes (Signed)
[facility]  [address] [place], [place] [address] Phone: [phone]  Fax: [phone]   Clinic Day:  11/21/2019  Referring physician: Volney American,*  Chief Complaint: Justin [name] is a 54 y.o. male with recurrent thrombosis who is seen for a 3 month assessment.   HPI: The patient was last seen in the hematology clinic on 08/21/2019. At that time, he felt "fine".  He had some issues with knee pain. Lower extremity duplex revealed no DVT. Exam was stable. Hematocrit was 46.3, hemoglobin 14.7, platelets 266,000, WBC 9,200. Creatinine was 1.36. Lupus anticoagulant panel was positive.  Negative studies included:  Anti-cardiolipin antibodies and beta-2-glycoprotein.  I discussed a GI consult for a colonoscopy.   He saw Marnee Guarneri, NP on 09/04/2019. He had left side lower back pain with left sided sciatica. He was given a Toradol injection in office. She prescribed prednisone taper and Flexeril. Recommended heat and ice for at home care.    Labs on 10/09/2019 revealed a positive lupus anticoagulant panel.  Colonoscopy on 10/10/2019 by Dr Jonathon Bellows was normal.  During the interim, patient stated "I'm making it".  Reports ongoing right lateral chest wall pain. He had a lumbar and thoracic spine MRI at [facility] on 11/16/2019.  Imaging revealed enhancing soft tissue adjacent to the right costovertebral junction at T9-T10, likely postsurgical scar from prior partial costovertebral junction resection.  There was a large left foraminal/far lateral disc protrusion at L4-L5 abutting with mass-effect on the exiting left L4 nerve root resulting in moderate left foraminal narrowing.  He has back pain due to a sciatic nerve. His back pain is so severe that he stays in bed for a few days and it feels like his legs are numb and he can not walk. He has left knee pain.  He denies any bruising or bleeding.   His BP was 134/98 today. Patient received his COVID-19  vaccine.   Past Medical History:  Diagnosis Date  . Arm vein blood clot    right  . Coronary artery disease   . DVT (deep venous thrombosis) ([place]) 2012; January 2016   History of right arm; now right lower shotty  . GERD (gastroesophageal reflux disease)   . Rib pain on right side 08/04/2016   RT rib pain that radiates around to the back. Level 8 of 10.  Constant. Started 1 mo ago, 1 week after DVT.  Marland Kitchen Vascular thoracic outlet syndrome    Right     Past Surgical History:  Procedure Laterality Date  . CAROTID ANGIOGRAM    . COLONOSCOPY WITH PROPOFOL N/A 10/10/2019   Procedure: COLONOSCOPY WITH PROPOFOL;  Surgeon: Jonathon Bellows, MD;  Location: [facility] ENDOSCOPY;  Service: Gastroenterology;  Laterality: N/A;  . KNEE CARTILAGE SURGERY     Performed for cracked patella and torn meniscus  . Right upper extremity thrombectomy  2012  . SCALENOTOMY W/O RESECTION CERVICAL RIB  2012   4 right-sided thoracic outlet syndrome    Family History  Problem Relation Age of Onset  . Heart disease Father   . Breast cancer Father 21  . Diabetes Father   . Heart attack Father   . Cancer Maternal Grandmother   . Leukemia Maternal Grandfather   . Prostate cancer Maternal Uncle     Social History:  reports that he has quit smoking. He smoked 0.50 packs per day. He has never used smokeless tobacco. He reports that he does not drink alcohol and does not use  drugs. He stopped smoking in his 22s. He lives in [place]. He is a Glass blower/designer. He denies any exposure to radiation or toxins. He has a son. The patient's wife is [name]. The patient is alone today.  Allergies: No Known Allergies  Current Medications: Current Outpatient Medications  Medication Sig Dispense Refill  . acetaminophen (TYLENOL) 500 MG tablet Take 1,000 mg by mouth every 6 (six) hours as needed.    Marland Kitchen atorvastatin (LIPITOR) 20 MG tablet Take 1 tablet (20 mg total) by mouth daily. 90 tablet 1  . Calcium Carbonate-Vit D-Min (GNP  CALCIUM 1200) 1200-1000 MG-UNIT CHEW Chew 1,200 mg by mouth daily with breakfast. Take in combination with vitamin D and magnesium. 90 tablet 3  . enoxaparin (LOVENOX) 150 MG/ML injection INJECT 0.74 MLS (110 MG TOTAL) INTO THE SKIN 2 (TWO) TIMES DAILY 133.2 mL 3  . gabapentin (NEURONTIN) 600 MG tablet Take 1 tablet (600 mg total) by mouth 2 (two) times daily. 180 tablet 1  . ibuprofen (ADVIL,MOTRIN) 800 MG tablet Take 800 mg by mouth every 8 (eight) hours as needed.    Derrill Memo ON 12/01/2019] oxyCODONE (OXY IR/ROXICODONE) 5 MG immediate release tablet Take 1 tablet (5 mg total) by mouth every 8 (eight) hours as needed for severe pain. Must last 30 days 90 tablet 0  . predniSONE (DELTASONE) 10 MG tablet Take 6 tablets by mouth daily for 2 days, then reduce by 1 tablet every 2 days until gone 42 tablet 0  . cyclobenzaprine (FLEXERIL) 10 MG tablet Take 1 tablet (10 mg total) by mouth 3 (three) times daily as needed for muscle spasms. (Patient not taking: Reported on 11/21/2019) 30 tablet 0   No current facility-administered medications for this visit.    Review of Systems  Constitutional: Positive for weight loss (10 lbs). Negative for chills, diaphoresis, fever and malaise/fatigue.       "I'm making it".  HENT: Negative.  Negative for congestion, ear pain, nosebleeds, sinus pain and sore throat.   Eyes: Negative.  Negative for blurred vision, double vision and photophobia.  Respiratory: Negative.  Negative for hemoptysis, sputum production and shortness of breath.   Cardiovascular: Positive for leg swelling (right knee radiating to calf; improving). Negative for chest pain, palpitations and PND.  Gastrointestinal: Negative.  Negative for abdominal pain, blood in stool, constipation, diarrhea, heartburn, melena, nausea and vomiting.  Genitourinary: Negative.  Negative for dysuria, frequency, hematuria and urgency.  Musculoskeletal: Positive for back pain and joint pain (left knee). Negative for  falls, myalgias and neck pain.       RIGHT lateral chest wall pain, chronic.  Skin: Negative.  Negative for itching and rash.  Neurological: Positive for sensory change (numbness in leg due to back pain). Negative for dizziness, tremors, focal weakness, weakness and headaches.  Endo/Heme/Allergies: Bruises/bleeds easily (on enoxaparin injections).  Psychiatric/Behavioral: Negative.  Negative for depression and memory loss. The patient is not nervous/anxious and does not have insomnia.   All other systems reviewed and are negative.  Performance status (ECOG): 1  Vitals Blood pressure (!) 134/98, pulse 77, temperature (!) 96.8 F (36 C), temperature source Tympanic, resp. rate 16, weight 247 lb 14.5 oz (112.5 kg), SpO2 98 %.   Physical Exam Vitals and nursing note reviewed.  Constitutional:      General: He is not in acute distress.    Appearance: Normal appearance. He is well-developed. He is not diaphoretic.  HENT:     Head: Normocephalic and atraumatic.  Mouth/Throat:     Pharynx: No oropharyngeal exudate.  Eyes:     General: No scleral icterus.    Conjunctiva/sclera: Conjunctivae normal.     Pupils: Pupils are equal, round, and reactive to light.     Comments: Glasses. Brown eyes.  Cardiovascular:     Rate and Rhythm: Normal rate and regular rhythm.     Heart sounds: Normal heart sounds. No murmur heard.   Pulmonary:     Effort: Pulmonary effort is normal. No respiratory distress.     Breath sounds: Normal breath sounds. No wheezing or rales.  Chest:     Chest wall: No tenderness.  Abdominal:     General: Bowel sounds are normal. There is no distension.     Palpations: Abdomen is soft. There is no mass.     Tenderness: There is no abdominal tenderness. There is no guarding or rebound.  Musculoskeletal:        General: Tenderness (near incision site, lower back) present. Normal range of motion.     Cervical back: Normal range of motion and neck supple.     Comments:  Prominent knot on left knee.  Lymphadenopathy:     Head:     Right side of head: No preauricular, posterior auricular or occipital adenopathy.     Left side of head: No preauricular, posterior auricular or occipital adenopathy.     Cervical: No cervical adenopathy.     Upper Body:     Right upper body: No supraclavicular adenopathy.     Left upper body: No supraclavicular adenopathy.     Lower Body: No right inguinal adenopathy. No left inguinal adenopathy.  Skin:    General: Skin is warm and dry.     Coloration: Skin is not pale.     Findings: No erythema or rash.  Neurological:     Mental Status: He is alert and oriented to person, place, and time.  Psychiatric:        Behavior: Behavior normal.        Thought Content: Thought content normal.        Judgment: Judgment normal.    Clinical Support on 11/21/2019  Component Date Value Ref Range Status  . Sodium 11/21/2019 135  135 - 145 mmol/L Final  . Potassium 11/21/2019 3.8  3.5 - 5.1 mmol/L Final  . Chloride 11/21/2019 103  98 - 111 mmol/L Final  . CO2 11/21/2019 24  22 - 32 mmol/L Final  . Glucose, Bld 11/21/2019 124* 70 - 99 mg/dL Final   Glucose reference range applies only to samples taken after fasting for at least 8 hours.  . BUN 11/21/2019 13  6 - 20 mg/dL Final  . Creatinine, Ser 11/21/2019 1.28* 0.61 - 1.24 mg/dL Final  . Calcium 11/21/2019 9.1  8.9 - 10.3 mg/dL Final  . Total Protein 11/21/2019 7.0  6.5 - 8.1 g/dL Final  . Albumin 11/21/2019 3.9  3.5 - 5.0 g/dL Final  . AST 11/21/2019 17  15 - 41 U/L Final  . ALT 11/21/2019 22  0 - 44 U/L Final  . Alkaline Phosphatase 11/21/2019 64  38 - 126 U/L Final  . Total Bilirubin 11/21/2019 0.2* 0.3 - 1.2 mg/dL Final  . GFR calc non Af Amer 11/21/2019 >60  >60 mL/min Final  . GFR calc Af Amer 11/21/2019 >60  >60 mL/min Final  . Anion gap 11/21/2019 8  5 - 15 Final   Performed at [facility], [address] [place],  [place] [address]  . WBC 11/21/2019 8.3   4.0 - 10.5 K/uL Final  . RBC 11/21/2019 4.69  4.22 - 5.81 MIL/uL Final  . Hemoglobin 11/21/2019 14.3  13.0 - 17.0 g/dL Final  . HCT 11/21/2019 43.6  39 - 52 % Final  . MCV 11/21/2019 93.0  80.0 - 100.0 fL Final  . MCH 11/21/2019 30.5  26.0 - 34.0 pg Final  . MCHC 11/21/2019 32.8  30.0 - 36.0 g/dL Final  . RDW 11/21/2019 12.6  11.5 - 15.5 % Final  . Platelets 11/21/2019 265  150 - 400 K/uL Final  . nRBC 11/21/2019 0.0  0.0 - 0.2 % Final  . Neutrophils Relative % 11/21/2019 47  % Final  . Neutro Abs 11/21/2019 3.9  1.7 - 7.7 K/uL Final  . Lymphocytes Relative 11/21/2019 40  % Final  . Lymphs Abs 11/21/2019 3.3  0.7 - 4.0 K/uL Final  . Monocytes Relative 11/21/2019 9  % Final  . Monocytes Absolute 11/21/2019 0.8  0 - 1 K/uL Final  . Eosinophils Relative 11/21/2019 3  % Final  . Eosinophils Absolute 11/21/2019 0.3  0 - 0 K/uL Final  . Basophils Relative 11/21/2019 1  % Final  . Basophils Absolute 11/21/2019 0.0  0 - 0 K/uL Final  . Immature Granulocytes 11/21/2019 0  % Final  . Abs Immature Granulocytes 11/21/2019 0.03  0.00 - 0.07 K/uL Final   Performed at [facility], [address] [place], [place] [address]    Assessment:  Justin [name] is a 54 y.o. male with recurrent thrombosisx 4. He developed a right upper extremity clotin 2010. He underwent thrombolysis. He was on Coumadin x 6 months. He developed a recurrent clot in the right upper extremityin 2011. He underwent thoracic outlet decompression. He was on Coumadin for 6-9 months. He developed a right lower extremity DVTin 2016. He was started on Xarelto.  He developed aleft upper extremity DVTon 06/07/2016 while on Xarelto. Duplex revealed a near occlusive DVT in the lateral aspect of the left subclavian vein and nonocclusive thrombus extending into the adjacent axillary vein. He was started on Lovenox.  Hypercoagulable work-upon 06/29/2016 revealed the following normal studies: CBC with diff,  Factor V Leiden, prothrombin gene mutation, protein C antigen and activity, protein S antigen and activity, ATIII antigen and activity, anticardiolipin antibodies and beta-2 glycoprotein antibodies. Lupus anticoagulant panelwas positive on Xarelto and Lovenox.  Repeat testing was positive on 08/21/2019 and 10/09/2019.  Chest CT angiogramon 07/02/2016 revealed no evidence of pulmonary embolism. There were small pulmonary nodules, largest 3 mm, of unclear significance.  Bone scan on 08/04/2016 revealed a focus of moderately increased uptakeat approximately the T9right paravertebral region. Chest CTon 07/20/2016 revealed a large right sided osteophyte at the T9-T10 level which was felt to explain the increased right paravertebral uptake uptake. There was also mildly increased uptake in the body of L2. Review of the lumbar spine on the abdominal CT scan of the same date revealed no definite radiographic abnormality in the body of L2 to explain the mildly increased uptake. There was no abnormal uptake within the ribs or elsewhere within the skeleton.  Thoracic spine MRIon 09/14/2016 revealed mild degenerative disc disease in the thoracic spine with non-compressive disc bulges at T5-6 and below. There was no disc herniation or compressive stenosis of the canal or foramina. 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 On the right at T9-10, there was  fairly bulky osteophytesemanating from the right costovertebral articulation. There was no evidence of neural compression secondary to this or the other bony degenerative changes. There was ordinary mild facet osteoarthritis without edematous change or significant hypertrophy.  He underwent right lateral mini-thoracotomyfor resection of a bony rib lesion on 01/14/2017. Notes indicate he underwent thoracic discectomy with decompression by anterior approach. Pathology revealed  an "exophytic lesion". He has had right sided rib pain post-operatively.  Bone densityon 12/06/2017 revealed a normal T score of -0.6 in the right femoral neck.  Colonoscopy on 10/10/2019 was normal.  Patient received the COVID-19 vaccine.  Symptomatically, continues to have chest wall pain since his surgery.  Exam is stable.  Plan: 1.   Labs today: CBC with diff, CMP. 2.   Recurrent thrombosis Clinically, he is doing well without recurrent thrombosis. He continues Lovenox SQ every 12 hours.  Continue every 2-year bone density testing while on Lovenox.  Bone density Reginald on 12/07/2019. 3.   Right chest wall pain  Patient continues to have chest wall pain s/p surgery  He notes possible future surgery.  If surgery planned, discussed plan for repeat lupus anticoagulant testing off Lovenox. 4.   Weight loss  Patient has lost 10 pounds since last visit.  Discuss plan for follow-up weight check and investigation if weight loss continues. 5.   RTC in 3 months for weight and labs (CBC with diff, CMP). 6.   RTC in 6 months for MD assessment and labs (CBC with diff, CMP).  I discussed the assessment and treatment plan with the patient.  The patient was provided an opportunity to ask questions and all were answered.  The patient agreed with the plan and demonstrated an understanding of the instructions.  The patient was advised to call back if the symptoms worsen or if the condition fails to improve as anticipated.   Lequita Asal, MD, PhD    11/21/2019, 9:03 AM  I, Selena Batten, am acting as scribe for [employer]. Mike Gip, MD, PhD.  I, Keira Bohlin C. Mike Gip, MD, have reviewed the above documentation for accuracy and completeness, and I agree with the above.

## 2019-11-21 ENCOUNTER — Other Ambulatory Visit: Payer: Self-pay

## 2019-11-21 ENCOUNTER — Inpatient Hospital Stay (HOSPITAL_BASED_OUTPATIENT_CLINIC_OR_DEPARTMENT_OTHER): Payer: 59 | Admitting: Hematology and Oncology

## 2019-11-21 ENCOUNTER — Inpatient Hospital Stay: Payer: 59 | Attending: Hematology and Oncology

## 2019-11-21 ENCOUNTER — Encounter: Payer: Self-pay | Admitting: Hematology and Oncology

## 2019-11-21 VITALS — BP 125/76 | HR 77 | Temp 96.8°F | Resp 16 | Wt 247.9 lb

## 2019-11-21 DIAGNOSIS — I82A21 Chronic embolism and thrombosis of right axillary vein: Secondary | ICD-10-CM

## 2019-11-21 DIAGNOSIS — Z7901 Long term (current) use of anticoagulants: Secondary | ICD-10-CM

## 2019-11-21 DIAGNOSIS — Z86711 Personal history of pulmonary embolism: Secondary | ICD-10-CM | POA: Insufficient documentation

## 2019-11-21 DIAGNOSIS — R760 Raised antibody titer: Secondary | ICD-10-CM | POA: Diagnosis not present

## 2019-11-21 DIAGNOSIS — Z86718 Personal history of other venous thrombosis and embolism: Secondary | ICD-10-CM | POA: Insufficient documentation

## 2019-11-21 DIAGNOSIS — I82B12 Acute embolism and thrombosis of left subclavian vein: Secondary | ICD-10-CM

## 2019-11-21 LAB — COMPREHENSIVE METABOLIC PANEL
ALT: 22 U/L (ref 0–44)
AST: 17 U/L (ref 15–41)
Albumin: 3.9 g/dL (ref 3.5–5.0)
Alkaline Phosphatase: 64 U/L (ref 38–126)
Anion gap: 8 (ref 5–15)
BUN: 13 mg/dL (ref 6–20)
CO2: 24 mmol/L (ref 22–32)
Calcium: 9.1 mg/dL (ref 8.9–10.3)
Chloride: 103 mmol/L (ref 98–111)
Creatinine, Ser: 1.28 mg/dL — ABNORMAL HIGH (ref 0.61–1.24)
GFR calc Af Amer: >60 mL/min (ref >60)
GFR calc non Af Amer: >60 mL/min (ref >60)
Glucose, Bld: 124 mg/dL — ABNORMAL HIGH (ref 70–99)
Potassium: 3.8 mmol/L (ref 3.5–5.1)
Sodium: 135 mmol/L (ref 135–145)
Total Bilirubin: 0.2 mg/dL — ABNORMAL LOW (ref 0.3–1.2)
Total Protein: 7 g/dL (ref 6.5–8.1)

## 2019-11-21 LAB — CBC WITH DIFFERENTIAL/PLATELET
Abs Immature Granulocytes: 0.03 10*3/uL (ref 0.00–0.07)
Basophils Absolute: 0 10*3/uL (ref 0.0–0.1)
Basophils Relative: 1 %
Eosinophils Absolute: 0.3 10*3/uL (ref 0.0–0.5)
Eosinophils Relative: 3 %
HCT: 43.6 % (ref 39.0–52.0)
Hemoglobin: 14.3 g/dL (ref 13.0–17.0)
Immature Granulocytes: 0 %
Lymphocytes Relative: 40 %
Lymphs Abs: 3.3 10*3/uL (ref 0.7–4.0)
MCH: 30.5 pg (ref 26.0–34.0)
MCHC: 32.8 g/dL (ref 30.0–36.0)
MCV: 93 fL (ref 80.0–100.0)
Monocytes Absolute: 0.8 10*3/uL (ref 0.1–1.0)
Monocytes Relative: 9 %
Neutro Abs: 3.9 10*3/uL (ref 1.7–7.7)
Neutrophils Relative %: 47 %
Platelets: 265 10*3/uL (ref 150–400)
RBC: 4.69 MIL/uL (ref 4.22–5.81)
RDW: 12.6 % (ref 11.5–15.5)
WBC: 8.3 10*3/uL (ref 4.0–10.5)
nRBC: 0 % (ref 0.0–0.2)

## 2019-11-21 NOTE — Progress Notes (Signed)
Patient has received his covid vaccines

## 2019-11-21 NOTE — Patient Instructions (Signed)
  Please call clinic if surgery is planned to check lupus anticoagulant panel.

## 2019-11-23 ENCOUNTER — Encounter: Payer: Self-pay | Admitting: Family Medicine

## 2019-11-23 ENCOUNTER — Ambulatory Visit (INDEPENDENT_AMBULATORY_CARE_PROVIDER_SITE_OTHER): Payer: 59 | Admitting: Family Medicine

## 2019-11-23 ENCOUNTER — Other Ambulatory Visit: Payer: Self-pay

## 2019-11-23 VITALS — BP 119/78 | HR 61 | Temp 98.1°F | Wt 246.0 lb

## 2019-11-23 DIAGNOSIS — R300 Dysuria: Secondary | ICD-10-CM | POA: Diagnosis not present

## 2019-11-23 LAB — MICROSCOPIC EXAMINATION
Bacteria, UA: NONE SEEN
WBC, UA: NONE SEEN /hpf (ref 0–5)

## 2019-11-23 LAB — UA/M W/RFLX CULTURE, ROUTINE
Bilirubin, UA: NEGATIVE
Glucose, UA: NEGATIVE
Ketones, UA: NEGATIVE
Leukocytes,UA: NEGATIVE
Nitrite, UA: NEGATIVE
Protein,UA: NEGATIVE
Specific Gravity, UA: 1.02 (ref 1.005–1.030)
Urobilinogen, Ur: 0.2 mg/dL (ref 0.2–1.0)
pH, UA: 5.5 (ref 5.0–7.5)

## 2019-11-23 MED ORDER — PHENAZOPYRIDINE HCL 200 MG PO TABS
200.0000 mg | ORAL_TABLET | Freq: Three times a day (TID) | ORAL | 0 refills | Status: DC | PRN
Start: 2019-11-23 — End: 2020-01-23

## 2019-11-23 MED ORDER — CIPROFLOXACIN HCL 250 MG PO TABS: 250.0000 mg | ORAL_TABLET | Freq: Two times a day (BID) | ORAL | 0 refills | Status: DC

## 2019-11-23 NOTE — Progress Notes (Signed)
BP 119/78   Pulse 61   Temp 98.1 F (36.7 C) (Oral)   Wt 246 lb (111.6 kg)   SpO2 98%   BMI 34.31 kg/m    Subjective:    Patient ID: Justin [name], male    DOB: Jan 04, 1966, 54 y.o.   MRN: [id number]  HPI: Justin [name] is a 54 y.o. male  Chief Complaint  Patient presents with  . Dysuria    x a week now   Progressive worsening of dysuria the past week. Denies hematuria, abdominal pain, flank pain, N/V/D, fevers. Not trying anything OTC for sxs.   Relevant past medical, surgical, family and social history reviewed and updated as indicated. Interim medical history since our last visit reviewed. Allergies and medications reviewed and updated.  Review of Systems  Per HPI unless specifically indicated above     Objective:    BP 119/78   Pulse 61   Temp 98.1 F (36.7 C) (Oral)   Wt 246 lb (111.6 kg)   SpO2 98%   BMI 34.31 kg/m   Wt Readings from Last 3 Encounters:  11/23/19 246 lb (111.6 kg)  11/21/19 247 lb 14.5 oz (112.5 kg)  10/10/19 250 lb (113.4 kg)    Physical Exam Vitals and nursing note reviewed.  Constitutional:      Appearance: Normal appearance.  HENT:     Head: Atraumatic.  Eyes:     Extraocular Movements: Extraocular movements intact.     Conjunctiva/sclera: Conjunctivae normal.  Cardiovascular:     Rate and Rhythm: Normal rate and regular rhythm.  Pulmonary:     Effort: Pulmonary effort is normal.     Breath sounds: Normal breath sounds.  Abdominal:     General: Bowel sounds are normal. There is no distension.     Palpations: Abdomen is soft.     Tenderness: There is no abdominal tenderness. There is no right CVA tenderness, left CVA tenderness or guarding.  Musculoskeletal:        General: Normal range of motion.     Cervical back: Normal range of motion and neck supple.  Skin:    General: Skin is warm and dry.  Neurological:     General: No focal deficit present.     Mental Status: He is oriented to person, place, and time.    Psychiatric:        Mood and Affect: Mood normal.        Thought Content: Thought content normal.        Judgment: Judgment normal.     Results for orders placed or performed in visit on 11/21/19  Comprehensive metabolic panel  Result Value Ref Range   Sodium 135 135 - 145 mmol/L   Potassium 3.8 3.5 - 5.1 mmol/L   Chloride 103 98 - 111 mmol/L   CO2 24 22 - 32 mmol/L   Glucose, Bld 124 (H) 70 - 99 mg/dL   BUN 13 6 - 20 mg/dL   Creatinine, Ser 1.28 (H) 0.61 - 1.24 mg/dL   Calcium 9.1 8.9 - 10.3 mg/dL   Total Protein 7.0 6.5 - 8.1 g/dL   Albumin 3.9 3.5 - 5.0 g/dL   AST 17 15 - 41 U/L   ALT 22 0 - 44 U/L   Alkaline Phosphatase 64 38 - 126 U/L   Total Bilirubin 0.2 (L) 0.3 - 1.2 mg/dL   GFR calc non Af Amer >60 >60 mL/min   GFR calc Af Amer >60 >60 mL/min  Anion gap 8 5 - 15  CBC with Differential/Platelet  Result Value Ref Range   WBC 8.3 4.0 - 10.5 K/uL   RBC 4.69 4.22 - 5.81 MIL/uL   Hemoglobin 14.3 13.0 - 17.0 g/dL   HCT 43.6 39 - 52 %   MCV 93.0 80.0 - 100.0 fL   MCH 30.5 26.0 - 34.0 pg   MCHC 32.8 30.0 - 36.0 g/dL   RDW 12.6 11.5 - 15.5 %   Platelets 265 150 - 400 K/uL   nRBC 0.0 0.0 - 0.2 %   Neutrophils Relative % 47 %   Neutro Abs 3.9 1.7 - 7.7 K/uL   Lymphocytes Relative 40 %   Lymphs Abs 3.3 0.7 - 4.0 K/uL   Monocytes Relative 9 %   Monocytes Absolute 0.8 0 - 1 K/uL   Eosinophils Relative 3 %   Eosinophils Absolute 0.3 0 - 0 K/uL   Basophils Relative 1 %   Basophils Absolute 0.0 0 - 0 K/uL   Immature Granulocytes 0 %   Abs Immature Granulocytes 0.03 0.00 - 0.07 K/uL      Assessment & Plan:   Problem List Items Addressed This Visit    None    Visit Diagnoses    Dysuria    -  Primary   U/A only pos for RBCs, suspect prostatitis. Tx with cipro, pyridium, fluids. Call if not improving   Relevant Orders   UA/M w/rflx Culture, Routine       Follow up plan: Return for as scheduled.

## 2019-12-11 ENCOUNTER — Ambulatory Visit
Admission: RE | Admit: 2019-12-11 | Discharge: 2019-12-11 | Disposition: A | Discharge status: Home / Self Care | Payer: 59 | Source: Ambulatory Visit | Attending: Hematology and Oncology | Admitting: Hematology and Oncology

## 2019-12-11 DIAGNOSIS — Z7901 Long term (current) use of anticoagulants: Secondary | ICD-10-CM | POA: Diagnosis not present

## 2019-12-28 ENCOUNTER — Telehealth: Payer: Self-pay | Admitting: Family Medicine

## 2019-12-28 NOTE — Telephone Encounter (Signed)
Copied from [place] [phone]. Topic: Medicare AWV >> Dec 28, 2019 10:38 AM Cher Nakai R wrote: Reason for CRM:  Left message for patient to call back and schedule Medicare Annual Wellness Visit (AWV) to be done virtually.  No hx of AWV eligible as of 01/06/2020  Please schedule at anytime with [facility]-Nurse Health Advisor.      39 Minutes appointment

## 2019-12-30 NOTE — Progress Notes (Signed)
PROVIDER NOTE: Information contained herein reflects review and annotations entered in association with encounter. Interpretation of such information and data should be left to medically-trained personnel. Information provided to patient can be located elsewhere in the medical record under "Patient Instructions". Document created using STT-dictation technology, any transcriptional errors that may result from process are unintentional.    Patient: Justin [name]  Service Category: E/M  Provider: Gaspar Cola, MD  DOB: 02/03/1966  DOS: 12/31/2019  Specialty: Interventional Pain Management  MRN: [id number]  Setting: Ambulatory outpatient  PCP: Volney American, PA-C  Type: Established Patient    Referring Provider: Volney American,*  Location: Office  Delivery: Face-to-face     HPI  Reason for encounter: [name], a 54 y.o. year old male, is here today for evaluation and management of his Chronic pain syndrome [G89.4]. [name] primary complain today is Pain (right side rib pain) Last encounter: Practice (09/28/2019). My last encounter with him was on 09/28/2019. Pertinent problems: Mr. 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 Pain Assessment: Severity of   is reported as a 7 /10. Location:    / . Onset:  . Quality:  . Timing:  . Modifying factor(s):  Marland Kitchen Vitals:  height is 5' 11"  (1.803  m) and weight is 225 lb (102.1 kg) (abnormal). His temporal temperature is 97.8 F (36.6 C). His blood pressure is 136/88 (abnormal) and his pulse is 67. His respiration is 18 and oxygen saturation is 100%.    The patient indicates doing well with the current medication regimen. No adverse reactions or side effects reported to the medications.  The patient returns today for his medication management.  His urine drug screen test came back within normal limits.  He indicates that he has been experiencing a little bit more pain in the right side of the neck and going down his right shoulder for the past 2 weeks.  At this point this does not qualify as "chronic pain" and therefore I have encouraged him to simply keep an eye on it.  He asked if I could give him something for arthritis, but seen that he is anticoagulated, this is not a good idea.  I did recommend that he try some over-the-counter turmeric and perhaps an Arnica cream, neither which is contraindicated in his condition.  I reminded him that he needs to be very careful not to take any nonsteroidal anti-inflammatory drugs due to the risk of GI upset and bleeding while he is anticoagulated.  He understood and accepted.  Pharmacotherapy Assessment   Analgesic: OxycodoneIR5 mg,1 tab PO q 8 hrs(2m/day of oxycodone) MME/day:22.565mday.   Monitoring: [place] PMP: PDMP reviewed during this encounter.       Pharmacotherapy: No side-effects or adverse reactions reported. Compliance: No problems identified. Effectiveness: Clinically acceptable.  ShHart RochesterRN  12/31/2019  2:47 PM  Sign when Signing Visit Nursing Pain Medication Assessment:  Safety precautions to be maintained throughout the outpatient stay will include: orient to surroundings, keep  bed in low position, maintain call bell within reach at all times, provide assistance with transfer out of bed and ambulation.  Medication Inspection Compliance: Pill count conducted under  aseptic conditions, in front of the patient. Neither the pills nor the bottle was removed from the patient's sight at any time. Once count was completed pills were immediately returned to the patient in their original bottle.  Medication: Oxycodone IR Pill/Patch Count: 0 of 90 pills remain Pill/Patch Appearance: Markings consistent with prescribed medication Bottle Appearance: Standard pharmacy container. Clearly labeled. Filled Date: 06 / 29 / 2021 Last Medication intake:  Today    UDS:  Summary  Date Value Ref Range Status  10/04/2019 Note  Final    Comment:    ==================================================================== [employer] [facility] 13 (MW) ==================================================================== Test                             Result       Flag       Units Drug Present and Declared for Prescription Verification   Oxycodone                      617          EXPECTED   ng/mg creat   Oxymorphone                    312          EXPECTED   ng/mg creat   Noroxycodone                   680          EXPECTED   ng/mg creat   Noroxymorphone                 55           EXPECTED   ng/mg creat    Sources of oxycodone are scheduled prescription medications.    Oxymorphone, noroxycodone, and noroxymorphone are expected    metabolites of oxycodone. Oxymorphone is also available as a    scheduled prescription medication. ==================================================================== Test                      Result    Flag   Units      Ref Range   Creatinine              163              mg/dL      >=20 ==================================================================== Declared Medications:  The flagging and interpretation on this report are based on the  following declared medications.  Unexpected results may arise from  inaccuracies in the declared medications.  **Note: The testing scope of this panel includes these medications:  Oxycodone  **Note: The  testing scope of this panel does not include the  following reported medications:  Acetaminophen  Atorvastatin  Calcium  Cyclobenzaprine  Enoxaparin (Lovenox)  Gabapentin  Ibuprofen  Prednisone  Vitamin D ==================================================================== For clinical consultation, please call [phone]. ====================================================================      ROS  Constitutional: Denies any fever or chills Gastrointestinal: No reported hemesis, hematochezia, vomiting, or acute GI distress Musculoskeletal: Denies any acute onset joint swelling, redness, loss of ROM, or weakness Neurological: No reported episodes of acute onset apraxia, aphasia, dysarthria, agnosia, amnesia, paralysis, loss of coordination, or loss of consciousness  Medication Review  GNP Calcium 1200, Turmeric, acetaminophen, atorvastatin,  enoxaparin, gabapentin, ibuprofen, oxyCODONE, and phenazopyridine  History Review  Allergy: Mr. Wellen has No Known Allergies. Drug: Mr. Milo  reports no history of drug use. Alcohol:  reports no history of alcohol use. Tobacco:  reports that he has quit smoking. He smoked 0.50 packs per day. He has never used smokeless tobacco. Social: Mr. Bostock  reports that he has quit smoking. He smoked 0.50 packs per day. He has never used smokeless tobacco. He reports that he does not drink alcohol and does not use drugs. Medical:  has a past medical history of Arm vein blood clot, Coronary artery disease, DVT (deep venous thrombosis) ([place]) (2012; January 2016), GERD (gastroesophageal reflux disease), Rib pain on right side (08/04/2016), and Vascular thoracic outlet syndrome. Surgical: Mr. Swaney  has a past surgical history that includes Knee cartilage surgery; Carotid angiogram; Scalenotomy w/o resection cervical rib (2012); Right upper extremity thrombectomy (2012); and Colonoscopy with propofol (N/A, 10/10/2019). Family: family history  includes Breast cancer (age of onset: 40) in his father; Cancer in his maternal grandmother; Diabetes in his father; Heart attack in his father; Heart disease in his father; Leukemia in his maternal grandfather; Prostate cancer in his maternal uncle.  Laboratory Chemistry Profile   Renal Lab Results  Component Value Date   BUN 13 11/21/2019   CREATININE 1.28 (H) 11/21/2019   BCR 8 (L) 08/02/2019   GFRAA >60 11/21/2019   GFRNONAA >60 11/21/2019     Hepatic Lab Results  Component Value Date   AST 17 11/21/2019   ALT 22 11/21/2019   ALBUMIN 3.9 11/21/2019   ALKPHOS 64 11/21/2019   LIPASE 90 03/02/2014     Electrolytes Lab Results  Component Value Date   NA 135 11/21/2019   K 3.8 11/21/2019   CL 103 11/21/2019   CALCIUM 9.1 11/21/2019   MG 2.0 07/28/2017     Bone Lab Results  Component Value Date   25OHVITD1 15 (L) 06/29/2018   25OHVITD2 8.4 06/29/2018   25OHVITD3 6.8 06/29/2018     Inflammation (CRP: Acute Phase) (ESR: Chronic Phase) Lab Results  Component Value Date   CRP 12 (H) 06/29/2018   ESRSEDRATE 27 06/29/2018   LATICACIDVEN 0.9 07/20/2016       Note: Above Lab results reviewed.  Recent Imaging Review  DG Bone Density EXAM: DUAL X-RAY ABSORPTIOMETRY (DXA) FOR BONE MINERAL DENSITY  IMPRESSION: Your patient Branndon Tuite completed a BMD test on 12/11/2019 using the [place] (software version: 14.10) manufactured by [employer]. The following summarizes the results of our evaluation. Technologist: [facility] PATIENT BIOGRAPHICAL: Name: Sanjay, Broadfoot Patient ID:  [id number] Birth Date: 1965/11/07 Height:     71.0 in. Gender:      Male      Exam Date:  12/11/2019 Weight:     246.0 lbs. Indications: DVT's Fractures: Treatments: Calcium, Lovenox, Vitamin D DENSITOMETRY RESULTS: Site          Region     Measured Date Measured Age WHO Classification Young Adult T-score BMD         %Change vs. Previous Significant Change  (*) DualFemur Neck Left 12/11/2019 54.6 Normal -0.3 1.030 g/cm2 0.7% - DualFemur Neck Left 12/06/2017 52.5 Normal -0.4 1.023 g/cm2 - -  DualFemur Total Mean 12/11/2019 54.6 Normal -0.3 1.059 g/cm2 3.3% Yes DualFemur Total Mean 12/06/2017 52.5 Normal -0.5 1.025 g/cm2 - -  Right Forearm Radius 33% 12/11/2019 54.6 Normal 0.7 1.064 g/cm2 -0.8% - Right Forearm Radius 33% 12/06/2017 52.5 Normal  0.8 1.073 g/cm2 - - ASSESSMENT: The BMD measured at Femur Neck Left is 1.030 g/cm2 with a T-score of -0.3. This patient is considered normal according to [facility]) criteria. Lumbar spine was not utilized due to advanced degenerative changes. The scan quality is good. Compared with prior study, there has been significant increase in the total hip.  World Pharmacologist [facility]) criteria for post-menopausal, Caucasian Women: Normal:                   T-score at or above -1 SD Osteopenia/low bone mass: T-score between -1 and -2.5 SD Osteoporosis:             T-score at or below -2.5 SD  RECOMMENDATIONS: 1. All patients should optimize calcium and vitamin D intake. 2. Consider FDA-approved medical therapies in postmenopausal women and men aged 5 years and older, based on the following: a. A hip or vertebral(clinical or morphometric) fracture b. T-score < -2.5 at the femoral neck or spine after appropriate evaluation to exclude secondary causes c. Low bone mass (T-score between -1.0 and -2.5 at the femoral neck or spine) and a 10-year probability of a hip fracture > 3% or a 10-year probability of a major osteoporosis-related fracture > 20% based on the [place]-adapted WHO algorithm 3. Clinician judgment and/or patient preferences may indicate treatment for people with 10-year fracture probabilities above or below these levels FOLLOW-UP: People with diagnosed cases of osteoporosis or osteopenia should be regularly tested for bone mineral density. For patients eligible  for Medicare, routine testing is allowed once every 2 years. The testing frequency can be increased to one year for patients who have rapidly progressing disease, or for those who are receiving medical therapy to restore bone mass.  I have reviewed this report, and agree with the above findings.  [facility]  Electronically Signed   By: Lowella Grip III M.D.   On: 12/11/2019 15:21 Note: Reviewed        Physical Exam  General appearance: Well nourished, well developed, and well hydrated. In no apparent acute distress Mental status: Alert, oriented x 3 (person, place, & time)       Respiratory: No evidence of acute respiratory distress Eyes: PERLA Vitals: BP (!) 136/88 (BP Location: Left Arm, Patient Position: Sitting, Cuff Size: Normal)   Pulse 67   Temp 97.8 F (36.6 C) (Temporal)   Resp 18   Ht 5' 11"  (1.803 m)   Wt (!) 225 lb (102.1 kg)   SpO2 100%   BMI 31.38 kg/m  BMI: Estimated body mass index is 31.38 kg/m as calculated from the following:   Height as of this encounter: 5' 11"  (1.803 m).   Weight as of this encounter: 225 lb (102.1 kg). Ideal: Ideal body weight: 75.3 kg (166 lb 0.1 oz) Adjusted ideal body weight: 86 kg (189 lb 9.7 oz)  Assessment   Status Diagnosis  Controlled Controlled Controlled 1. Chronic pain syndrome   2. Chronic thoracic back pain (Primary Area of Pain) (Right)   3. Thoracic radiculitis (Secondary Area of Pain)   4. Right upper quadrant abdominal pain (Tertiary Area of Pain)   5. Rib pain on right side (Fourth Area of Pain)   6. Pharmacologic therapy   7. Acute pain of right shoulder      Updated Problems: Problem  Right Shoulder Pain  Acute Postoperative Pain (Resolved)    Plan of Care  Problem-specific:  No problem-specific Assessment & Plan notes found for  this encounter.  Mr. DEMONTRE PADIN has a current medication list which includes the following long-term medication(s): atorvastatin, gnp calcium  1200, enoxaparin, gabapentin, oxycodone, [START ON 01/30/2020] oxycodone, and [START ON 02/29/2020] oxycodone.  Pharmacotherapy (Medications Ordered): Meds ordered this encounter  Medications  . oxyCODONE (OXY IR/ROXICODONE) 5 MG immediate release tablet    Sig: Take 1 tablet (5 mg total) by mouth every 8 (eight) hours as needed for severe pain. Must last 30 days    Dispense:  90 tablet    Refill:  0    Chronic Pain: STOP Act (Not applicable) Fill 1 day early if closed on refill date. Do not fill until: 12/31/2019. To last until: 01/30/2020. Avoid benzodiazepines within 8 hours of opioids  . oxyCODONE (OXY IR/ROXICODONE) 5 MG immediate release tablet    Sig: Take 1 tablet (5 mg total) by mouth every 8 (eight) hours as needed for severe pain. Must last 30 days    Dispense:  90 tablet    Refill:  0    Chronic Pain: STOP Act (Not applicable) Fill 1 day early if closed on refill date. Do not fill until: 01/30/2020. To last until: 02/29/2020. Avoid benzodiazepines within 8 hours of opioids  . oxyCODONE (OXY IR/ROXICODONE) 5 MG immediate release tablet    Sig: Take 1 tablet (5 mg total) by mouth every 8 (eight) hours as needed for severe pain. Must last 30 days    Dispense:  90 tablet    Refill:  0    Chronic Pain: STOP Act (Not applicable) Fill 1 day early if closed on refill date. Do not fill until: 02/29/2020. To last until: 03/30/2020. Avoid benzodiazepines within 8 hours of opioids  . Turmeric 500 MG CAPS    Sig: Take 500 mg by mouth daily.    Dispense:  90 capsule    Refill:  0    OTC Recommendation. Please provide information on: where to find; how to take; side-effects; adverse reactions; drug-to-drug interactions; and contraindications. If unavailable, recommend similar substitute.   Orders:  No orders of the defined types were placed in this encounter.  Follow-up plan:   Return in about 3 months (around 03/26/2020) for 40-min, F2F, MM (on eval day).      Interventional management  options:  Considering:   Diagnostic right thoracicSNRB Diagnosticright thoracicfacetblock Possible right thoracic facet medial branch RFA   Palliative PRN treatment(s):   Palliative right T6, T7, T8, &T9intercostal nerve block  Palliative right T6, T7, T8, &T9intercostal nerve RFA #2 (Last done 12/29/17)        Recent Visits No visits were found meeting these conditions. Showing recent visits within past 90 days and meeting all other requirements Today's Visits Date Type Provider Dept  12/31/19 Office Visit Milinda Pointer, MD [facility]  Showing today's visits and meeting all other requirements Future Appointments Date Type Provider Dept  03/24/20 Appointment Milinda Pointer, MD [facility]  Showing future appointments within next 90 days and meeting all other requirements  I discussed the assessment and treatment plan with the patient. The patient was provided an opportunity to ask questions and all were answered. The patient agreed with the plan and demonstrated an understanding of the instructions.  Patient advised to call back or seek an in-person evaluation if the symptoms or condition worsens.  Duration of encounter: 30 minutes.  Note by: Gaspar Cola, MD Date: 12/31/2019; Time: 5:18 PM

## 2019-12-31 ENCOUNTER — Encounter: Payer: Self-pay | Admitting: Pain Medicine

## 2019-12-31 ENCOUNTER — Other Ambulatory Visit: Payer: Self-pay

## 2019-12-31 ENCOUNTER — Ambulatory Visit: Payer: 59 | Attending: Pain Medicine | Admitting: Pain Medicine

## 2019-12-31 VITALS — BP 136/88 | HR 67 | Temp 97.8°F | Resp 18 | Ht 71.0 in | Wt 225.0 lb

## 2019-12-31 DIAGNOSIS — R0781 Pleurodynia: Secondary | ICD-10-CM

## 2019-12-31 DIAGNOSIS — M546 Pain in thoracic spine: Secondary | ICD-10-CM

## 2019-12-31 DIAGNOSIS — G8929 Other chronic pain: Secondary | ICD-10-CM | POA: Diagnosis present

## 2019-12-31 DIAGNOSIS — R1011 Right upper quadrant pain: Secondary | ICD-10-CM

## 2019-12-31 DIAGNOSIS — Z79899 Other long term (current) drug therapy: Secondary | ICD-10-CM

## 2019-12-31 DIAGNOSIS — M25511 Pain in right shoulder: Secondary | ICD-10-CM | POA: Diagnosis present

## 2019-12-31 DIAGNOSIS — G894 Chronic pain syndrome: Secondary | ICD-10-CM

## 2019-12-31 DIAGNOSIS — M5414 Radiculopathy, thoracic region: Secondary | ICD-10-CM

## 2019-12-31 MED ORDER — OXYCODONE HCL 5 MG PO TABS: 5.0000 mg | ORAL_TABLET | Freq: Three times a day (TID) | ORAL | 0 refills | Status: DC | PRN

## 2019-12-31 MED ORDER — TURMERIC 500 MG PO CAPS: 500.0000 mg | ORAL_CAPSULE | Freq: Every day | ORAL | 0 refills | Status: DC

## 2019-12-31 NOTE — Patient Instructions (Addendum)
____________________________________________________________________________________________  Drug Holidays (Slow)  What is a "Drug Holiday"? Drug Holiday: is the name given to the period of time during which a patient stops taking a medication(s) for the purpose of eliminating tolerance to the drug.  Benefits . Improved effectiveness of opioids. . Decreased opioid dose needed to achieve benefits. . Improved pain with lesser dose.  What is tolerance? Tolerance: is the progressive decreased in effectiveness of a drug due to its repetitive use. With repetitive use, the body gets use to the medication and as a consequence, it loses its effectiveness. This is a common problem seen with opioid pain medications. As a result, a larger dose of the drug is needed to achieve the same effect that used to be obtained with a smaller dose.  How long should a "Drug Holiday" last? You should stay off of the pain medicine for at least 14 consecutive days. (2 weeks)  Should I stop the medicine "cold turkey"? No. You should always coordinate with your Pain Specialist so that he/she can provide you with the correct medication dose to make the transition as smoothly as possible.  How do I stop the medicine? Slowly. You will be instructed to decrease the daily amount of pills that you take by one (1) pill every seven (7) days. This is called a "slow downward taper" of your dose. For example: if you normally take four (4) pills per day, you will be asked to drop this dose to three (3) pills per day for seven (7) days, then to two (2) pills per day for seven (7) days, then to one (1) per day for seven (7) days, and at the end of those last seven (7) days, this is when the "Drug Holiday" would start.   Will I have withdrawals? By doing a "slow downward taper" like this one, it is unlikely that you will experience any significant withdrawal symptoms. Typically, what triggers withdrawals is the sudden stop of a high  dose opioid therapy. Withdrawals can usually be avoided by slowly decreasing the dose over a prolonged period of time. If you do not follow these instructions and decide to stop your medication abruptly, withdrawals may be possible.  What are withdrawals? Withdrawals: refers to the wide range of symptoms that occur after stopping or dramatically reducing opiate drugs after heavy and prolonged use. Withdrawal symptoms do not occur to patients that use low dose opioids, or those who take the medication sporadically. Contrary to benzodiazepine (example: Valium, Xanax, etc.) or alcohol withdrawals ("Delirium Tremens"), opioid withdrawals are not lethal. Withdrawals are the physical manifestation of the body getting rid of the excess receptors.  Expected Symptoms Early symptoms of withdrawal may include: . Agitation . Anxiety . Muscle aches . Increased tearing . Insomnia . Runny nose . Sweating . Yawning  Late symptoms of withdrawal may include: . Abdominal cramping . Diarrhea . Dilated pupils . Goose bumps . Nausea . Vomiting  Will I experience withdrawals? Due to the slow nature of the taper, it is very unlikely that you will experience any.  What is a slow taper? Taper: refers to the gradual decrease in dose.  (Last update: 12/26/2019) ____________________________________________________________________________________________    ____________________________________________________________________________________________  Medication Rules  Purpose: To inform patients, and their family members, of our rules and regulations.  Applies to: All patients receiving prescriptions (written or electronic).  Pharmacy of record: Pharmacy where electronic prescriptions will be sent. If written prescriptions are taken to a different pharmacy, please inform the nursing staff. The pharmacy   listed in the electronic medical record should be the one where you would like electronic prescriptions  to be sent.  Electronic prescriptions: In compliance with the [place] Strengthen Opioid Misuse Prevention (STOP) Act of 2017 (Session Law 2017-74/H243), effective June 07, 2018, all controlled substances must be electronically prescribed. Calling prescriptions to the pharmacy will cease to exist.  Prescription refills: Only during scheduled appointments. Applies to all prescriptions.  NOTE: The following applies primarily to controlled substances (Opioid* Pain Medications).   Type of encounter (visit): For patients receiving controlled substances, face-to-face visits are required. (Not an option or up to the patient.)  Patient's responsibilities: 1. Pain Pills: Bring all pain pills to every appointment (except for procedure appointments). 2. Pill Bottles: Bring pills in original pharmacy bottle. Always bring the newest bottle. Bring bottle, even if empty. 3. Medication refills: You are responsible for knowing and keeping track of what medications you take and those you need refilled. The day before your appointment: write a list of all prescriptions that need to be refilled. The day of the appointment: give the list to the admitting nurse. Prescriptions will be written only during appointments. No prescriptions will be written on procedure days. If you forget a medication: it will not be "Called in", "Faxed", or "electronically sent". You will need to get another appointment to get these prescribed. No early refills. Do not call asking to have your prescription filled early. 4. Prescription Accuracy: You are responsible for carefully inspecting your prescriptions before leaving our office. Have the discharge nurse carefully go over each prescription with you, before taking them home. Make sure that your name is accurately spelled, that your address is correct. Check the name and dose of your medication to make sure it is accurate. Check the number of pills, and the written instructions to  make sure they are clear and accurate. Make sure that you are given enough medication to last until your next medication refill appointment. 5. Taking Medication: Take medication as prescribed. When it comes to controlled substances, taking less pills or less frequently than prescribed is permitted and encouraged. Never take more pills than instructed. Never take medication more frequently than prescribed.  6. Inform other Doctors: Always inform, all of your healthcare providers, of all the medications you take. 7. Pain Medication from other Providers: You are not allowed to accept any additional pain medication from any other Doctor or Healthcare provider. There are two exceptions to this rule. (see below) In the event that you require additional pain medication, you are responsible for notifying us, as stated below. 8. Medication Agreement: You are responsible for carefully reading and following our Medication Agreement. This must be signed before receiving any prescriptions from our practice. Safely store a copy of your signed Agreement. Violations to the Agreement will result in no further prescriptions. (Additional copies of our Medication Agreement are available upon request.) 9. Laws, Rules, & Regulations: All patients are expected to follow all Federal and State Laws, Statutes, Rules, & Regulations. Ignorance of the Laws does not constitute a valid excuse.  10. Illegal drugs and Controlled Substances: The use of illegal substances (including, but not limited to marijuana and its derivatives) and/or the illegal use of any controlled substances is strictly prohibited. Violation of this rule may result in the immediate and permanent discontinuation of any and all prescriptions being written by our practice. The use of any illegal substances is prohibited. 11. Adopted CDC guidelines & recommendations: Target dosing levels will be at or   below 60 MME/day. Use of benzodiazepines** is not  recommended.  Exceptions: There are only two exceptions to the rule of not receiving pain medications from other Healthcare Providers. 1. Exception #1 (Emergencies): In the event of an emergency (i.e.: accident requiring emergency care), you are allowed to receive additional pain medication. However, you are responsible for: As soon as you are able, call our office [phone], at any time of the day or night, and leave a message stating your name, the date and nature of the emergency, and the name and dose of the medication prescribed. In the event that your call is answered by a member of our staff, make sure to document and save the date, time, and the name of the person that took your information.  2. Exception #2 (Planned Surgery): In the event that you are scheduled by another doctor or dentist to have any type of surgery or procedure, you are allowed (for a period no longer than 30 days), to receive additional pain medication, for the acute post-op pain. However, in this case, you are responsible for picking up a copy of our "Post-op Pain Management for Surgeons" handout, and giving it to your surgeon or dentist. This document is available at our office, and does not require an appointment to obtain it. Simply go to our office during business hours (Monday-Thursday from 8:00 AM to 4:00 PM) (Friday 8:00 AM to 12:00 Noon) or if you have a scheduled appointment with us, prior to your surgery, and ask for it by name. In addition, you will need to provide us with your name, name of your surgeon, type of surgery, and date of procedure or surgery.  *Opioid medications include: morphine, codeine, oxycodone, oxymorphone, hydrocodone, hydromorphone, meperidine, tramadol, tapentadol, buprenorphine, fentanyl, methadone. **Benzodiazepine medications include: diazepam (Valium), alprazolam (Xanax), clonazepam (Klonopine), lorazepam (Ativan), clorazepate (Tranxene), chlordiazepoxide (Librium), estazolam (Prosom),  oxazepam (Serax), temazepam (Restoril), triazolam (Halcion) (Last updated: 08/04/2017) ____________________________________________________________________________________________   ____________________________________________________________________________________________  Medication Recommendations and Reminders  Applies to: All patients receiving prescriptions (written and/or electronic).  Medication Rules & Regulations: These rules and regulations exist for your safety and that of others. They are not flexible and neither are we. Dismissing or ignoring them will be considered "non-compliance" with medication therapy, resulting in complete and irreversible termination of such therapy. (See document titled "Medication Rules" for more details.) In all conscience, because of safety reasons, we cannot continue providing a therapy where the patient does not follow instructions.  Pharmacy of record:   Definition: This is the pharmacy where your electronic prescriptions will be sent.   We do not endorse any particular pharmacy, however, we have experienced problems with [facility] not securing enough medication supply for the community.  We do not restrict you in your choice of pharmacy. However, once we write for your prescriptions, we will NOT be re-sending more prescriptions to fix restricted supply problems created by your pharmacy, or your insurance.   The pharmacy listed in the electronic medical record should be the one where you want electronic prescriptions to be sent.  If you choose to change pharmacy, simply notify our nursing staff.  Recommendations:  Keep all of your pain medications in a safe place, under lock and key, even if you live alone. We will NOT replace lost, stolen, or damaged medication.  After you fill your prescription, take 1 week's worth of pills and put them away in a safe place. You should keep a separate, properly labeled bottle for this purpose. The remainder    should be kept in the original bottle. Use this as your primary supply, until it runs out. Once it's gone, then you know that you have 1 week's worth of medicine, and it is time to come in for a prescription refill. If you do this correctly, it is unlikely that you will ever run out of medicine.  To make sure that the above recommendation works, it is very important that you make sure your medication refill appointments are scheduled at least 1 week before you run out of medicine. To do this in an effective manner, make sure that you do not leave the office without scheduling your next medication management appointment. Always ask the nursing staff to show you in your prescription , when your medication will be running out. Then arrange for the receptionist to get you a return appointment, at least 7 days before you run out of medicine. Do not wait until you have 1 or 2 pills left, to come in. This is very poor planning and does not take into consideration that we may need to cancel appointments due to bad weather, sickness, or emergencies affecting our staff.  DO NOT ACCEPT A "Partial Fill": If for any reason your pharmacy does not have enough pills/tablets to completely fill or refill your prescription, do not allow for a "partial fill". The law allows the pharmacy to complete that prescription within 72 hours, without requiring a new prescription. If they do not fill the rest of your prescription within those 72 hours, you will need a separate prescription to fill the remaining amount, which we will NOT provide. If the reason for the partial fill is your insurance, you will need to talk to the pharmacist about payment alternatives for the remaining tablets, but again, DO NOT ACCEPT A PARTIAL FILL, unless you can trust your pharmacist to obtain the remainder of the pills within 72 hours.  Prescription refills and/or changes in medication(s):   Prescription refills, and/or changes in dose or medication,  will be conducted only during scheduled medication management appointments. (Applies to both, written and electronic prescriptions.)  No refills on procedure days. No medication will be changed or started on procedure days. No changes, adjustments, and/or refills will be conducted on a procedure day. Doing so will interfere with the diagnostic portion of the procedure.  No phone refills. No medications will be "called into the pharmacy".  No Fax refills.  No weekend refills.  No Holliday refills.  No after hours refills.  Remember:  Business hours are:  Monday to Thursday 8:00 AM to 4:00 PM Provider's Schedule: Charleton Deyoung, MD - Appointments are:  Medication management: Monday and Wednesday 8:00 AM to 4:00 PM Procedure day: Tuesday and Thursday 7:30 AM to 4:00 PM Bilal Lateef, MD - Appointments are:  Medication management: Tuesday and Thursday 8:00 AM to 4:00 PM Procedure day: Monday and Wednesday 7:30 AM to 4:00 PM (Last update: 12/26/2019) ____________________________________________________________________________________________   ____________________________________________________________________________________________  CANNABIDIOL (AKA: CBD Oil or Pills)  Applies to: All patients receiving prescriptions of controlled substances (written and/or electronic).  General Information: Cannabidiol (CBD), a derivative of Marijuana, was discovered in 1940. It is one of some 113 identified cannabinoids in cannabis (Marijuana) plants, accounting for up to 40% of the plant's extract. As of 2018, preliminary clinical research on cannabidiol included studies of anxiety, cognition, movement disorders, and pain.  Cannabidiol is consummed in multiple ways, including inhalation of cannabis smoke or vapor, as an aerosol spray into the cheek, and by mouth. It   may be supplied as CBD oil containing CBD as the active ingredient (no added tetrahydrocannabinol (THC) or terpenes), a full-plant  CBD-dominant hemp extract oil, capsules, dried cannabis, or as a liquid solution. CBD is thought not have the same psychoactivity as THC, and may affect the actions of THC. Studies suggest that CBD may interact with different biological targets, including cannabinoid receptors and other neurotransmitter receptors. As of 2018 the mechanism of action for its biological effects has not been determined.  In the [place], cannabidiol has a limited approval by the Food and Drug Administration (FDA) for treatment of only two types of epilepsy disorders. The side effects of long-term use of the drug include somnolence, decreased appetite, diarrhea, fatigue, malaise, weakness, sleeping problems, and others.  CBD remains a Schedule I drug prohibited for any use.  Legality: Some manufacturers ship CBD products nationally, an illegal action which the FDA has not enforced in 2018, with CBD remaining the subject of an FDA investigational new drug evaluation, and is not considered legal as a dietary supplement or food ingredient as of December 2018. Federal illegality has made it difficult historically to conduct research on CBD. CBD is openly sold in head shops and health food stores in some states where such sales have not been explicitly legalized.  Warning: Because it is not FDA approved for general use or treatment of pain, it is not required to undergo the same manufacturing controls as prescription drugs.  This means that the available cannabidiol (CBD) may be contaminated with THC.  If this is the case, it will trigger a positive urine drug screen (UDS) test for cannabinoids (Marijuana).  Because a positive UDS for illicit substances is a violation of our medication agreement, your opioid analgesics (pain medicine) may be permanently discontinued. (Last update: 12/26/2019) ____________________________________________________________________________________________   Calcium 1200 mg/day Vit D 10,000  IU/day Magnesium 500 mg, 1-2/day

## 2019-12-31 NOTE — Progress Notes (Signed)
Nursing Pain Medication Assessment:  Safety precautions to be maintained throughout the outpatient stay will include: orient to surroundings, keep bed in low position, maintain call bell within reach at all times, provide assistance with transfer out of bed and ambulation.  Medication Inspection Compliance: Pill count conducted under aseptic conditions, in front of the patient. Neither the pills nor the bottle was removed from the patient's sight at any time. Once count was completed pills were immediately returned to the patient in their original bottle.  Medication: Oxycodone IR Pill/Patch Count: 0 of 90 pills remain Pill/Patch Appearance: Markings consistent with prescribed medication Bottle Appearance: Standard pharmacy container. Clearly labeled. Filled Date: 06 / 29 / 2021 Last Medication intake:  Today

## 2020-01-01 ENCOUNTER — Encounter: Payer: Medicare Other | Admitting: Pain Medicine

## 2020-01-23 ENCOUNTER — Telehealth: Payer: Self-pay | Admitting: Hematology and Oncology

## 2020-01-23 ENCOUNTER — Encounter: Payer: Self-pay | Admitting: Family Medicine

## 2020-01-23 ENCOUNTER — Other Ambulatory Visit: Payer: Self-pay

## 2020-01-23 ENCOUNTER — Telehealth: Payer: Self-pay | Admitting: *Deleted

## 2020-01-23 ENCOUNTER — Ambulatory Visit (INDEPENDENT_AMBULATORY_CARE_PROVIDER_SITE_OTHER): Payer: 59 | Admitting: Family Medicine

## 2020-01-23 VITALS — BP 128/83 | HR 64 | Temp 98.1°F | Ht 71.6 in | Wt 243.0 lb

## 2020-01-23 DIAGNOSIS — G8912 Acute post-thoracotomy pain: Secondary | ICD-10-CM | POA: Diagnosis not present

## 2020-01-23 DIAGNOSIS — R7301 Impaired fasting glucose: Secondary | ICD-10-CM

## 2020-01-23 DIAGNOSIS — E782 Mixed hyperlipidemia: Secondary | ICD-10-CM

## 2020-01-23 DIAGNOSIS — I82511 Chronic embolism and thrombosis of right femoral vein: Secondary | ICD-10-CM

## 2020-01-23 DIAGNOSIS — E559 Vitamin D deficiency, unspecified: Secondary | ICD-10-CM

## 2020-01-23 DIAGNOSIS — Z125 Encounter for screening for malignant neoplasm of prostate: Secondary | ICD-10-CM

## 2020-01-23 DIAGNOSIS — M79601 Pain in right arm: Secondary | ICD-10-CM

## 2020-01-23 DIAGNOSIS — Z0000 Encounter for general adult medical examination without abnormal findings: Secondary | ICD-10-CM

## 2020-01-23 DIAGNOSIS — Z1159 Encounter for screening for other viral diseases: Secondary | ICD-10-CM

## 2020-01-23 LAB — MICROSCOPIC EXAMINATION
Bacteria, UA: NONE SEEN
WBC, UA: NONE SEEN /hpf (ref 0–5)

## 2020-01-23 LAB — UA/M W/RFLX CULTURE, ROUTINE
Bilirubin, UA: NEGATIVE
Glucose, UA: NEGATIVE
Ketones, UA: NEGATIVE
Leukocytes,UA: NEGATIVE
Nitrite, UA: NEGATIVE
Protein,UA: NEGATIVE
Specific Gravity, UA: 1.015 (ref 1.005–1.030)
Urobilinogen, Ur: 0.2 mg/dL (ref 0.2–1.0)
pH, UA: 5.5 (ref 5.0–7.5)

## 2020-01-23 MED ORDER — GABAPENTIN 600 MG PO TABS: 600.0000 mg | ORAL_TABLET | Freq: Two times a day (BID) | ORAL | 1 refills | Status: DC

## 2020-01-23 MED ORDER — ATORVASTATIN CALCIUM 20 MG PO TABS: 20.0000 mg | ORAL_TABLET | Freq: Every day | ORAL | 1 refills | Status: DC

## 2020-01-23 MED ORDER — PREDNISONE 10 MG PO TABS: ORAL_TABLET | ORAL | 0 refills | Status: DC

## 2020-01-23 NOTE — Assessment & Plan Note (Signed)
Following with Neurosurgery and Pain Mgmt, continue per their recommendations

## 2020-01-23 NOTE — Assessment & Plan Note (Signed)
Diet controlled. Recheck A1C, adjust as needed. Continue working on lifestyle modifications

## 2020-01-23 NOTE — Assessment & Plan Note (Signed)
Recheck lipids, adjust as needed. Continue lipitor regimen, diet and exercise

## 2020-01-23 NOTE — Progress Notes (Signed)
BP 128/83   Pulse 64   Temp 98.1 F (36.7 C) (Oral)   Ht 5' 11.6" (1.819 m)   Wt 243 lb (110.2 kg)   SpO2 97%   BMI 33.33 kg/m    Subjective:    Patient ID: Justin [name], male    DOB: 04-07-1966, 54 y.o.   MRN: [id number]  HPI: Justin [name] is a 54 y.o. male presenting on 01/23/2020 for comprehensive medical examination. Current medical complaints include:see below  About 6 weeks of a "crick" in his neck on the right side, better overall regarding stiffness in the neck but still having pain at base of neck radiating down beyond right shoulder. Does have some mild swelling all the way down his arm. Hx of UE DVTs and he worries the sxs feel consistent.   IFG - diet controlled, tries to eat healthy diet. Cannot exercise much due to chronic post-op pain of right ribs which is followed by Pain Mgmt, currently on gabapentin and oxycodone regimen. Taking lipitor for HLD, tolerating well wtihout side effects. Denies CP, SOB, claudication, myalgias. On lovenox for hx of DVTs, followed by Hematology.   He currently lives with: Interim Problems from his last visit: no  Depression Screen done today and results listed below:  Depression screen [facility] 2/9 01/23/2020 12/31/2019 12/21/2018 02/02/2018 12/29/2017  Decreased Interest 0 0 0 0 0  Down, Depressed, Hopeless 0 0 0 0 0  PHQ - 2 Score 0 0 0 0 0  Altered sleeping 2 - - - -  Tired, decreased energy 0 - - - -  Change in appetite 0 - - - -  Feeling bad or failure about yourself  0 - - - -  Trouble concentrating 0 - - - -  Moving slowly or fidgety/restless 0 - - - -  Suicidal thoughts 0 - - - -  PHQ-9 Score 2 - - - -    The patient does not have a history of falls. I did complete a risk assessment for falls. A plan of care for falls was documented.   Past Medical History:  Past Medical History:  Diagnosis Date  . Arm vein blood clot    right  . Coronary artery disease   . DVT (deep venous thrombosis) ([place]) 2012; January 2016    History of right arm; now right lower shotty  . GERD (gastroesophageal reflux disease)   . Rib pain on right side 08/04/2016   RT rib pain that radiates around to the back. Level 8 of 10.  Constant. Started 1 mo ago, 1 week after DVT.  Marland Kitchen Vascular thoracic outlet syndrome    Right     Surgical History:  Past Surgical History:  Procedure Laterality Date  . CAROTID ANGIOGRAM    . COLONOSCOPY WITH PROPOFOL N/A 10/10/2019   Procedure: COLONOSCOPY WITH PROPOFOL;  Surgeon: Jonathon Bellows, MD;  [facility];  Service: Gastroenterology;  Laterality: N/A;  . KNEE CARTILAGE SURGERY     Performed for cracked patella and torn meniscus  . Right upper extremity thrombectomy  2012  . SCALENOTOMY W/O RESECTION CERVICAL RIB  2012   4 right-sided thoracic outlet syndrome    Medications:  Current Outpatient Medications on File Prior to Visit  Medication Sig  . acetaminophen (TYLENOL) 500 MG tablet Take 1,000 mg by mouth every 6 (six) hours as needed.  . Calcium Carbonate-Vit D-Min (GNP CALCIUM 1200) 1200-1000 MG-UNIT CHEW Chew 1,200 mg by mouth daily with breakfast. Take in combination  with vitamin D and magnesium.  Marland Kitchen ibuprofen (ADVIL,MOTRIN) 800 MG tablet Take 800 mg by mouth every 8 (eight) hours as needed.  Marland Kitchen oxyCODONE (OXY IR/ROXICODONE) 5 MG immediate release tablet Take 1 tablet (5 mg total) by mouth every 8 (eight) hours as needed for severe pain. Must last 30 days  . Turmeric 500 MG CAPS Take 500 mg by mouth daily.  Marland Kitchen enoxaparin (LOVENOX) 150 MG/ML injection INJECT 0.74 MLS (110 MG TOTAL) INTO THE SKIN 2 (TWO) TIMES DAILY  . [START ON 01/30/2020] oxyCODONE (OXY IR/ROXICODONE) 5 MG immediate release tablet Take 1 tablet (5 mg total) by mouth every 8 (eight) hours as needed for severe pain. Must last 30 days (Patient not taking: Reported on 01/23/2020)  . [START ON 02/29/2020] oxyCODONE (OXY IR/ROXICODONE) 5 MG immediate release tablet Take 1 tablet (5 mg total) by mouth every 8 (eight) hours as  needed for severe pain. Must last 30 days (Patient not taking: Reported on 01/23/2020)   No current facility-administered medications on file prior to visit.    Allergies:  No Known Allergies  Social History:  Social History   Socioeconomic History  . Marital status: Married    Spouse name: Not on file  . Number of children: Not on file  . Years of education: Not on file  . Highest education level: Not on file  Occupational History    Comment: [employer]  Tobacco Use  . Smoking status: Former Smoker    Packs/day: 0.50  . Smokeless tobacco: Never Used  . Tobacco comment: quit smoking in 2010  Vaping Use  . Vaping Use: Never used  Substance and Sexual Activity  . Alcohol use: No  . Drug use: No  . Sexual activity: Yes    Birth control/protection: None  Other Topics Concern  . Not on file  Social History Narrative   He works full-time at [employer].    He is married. Nonsmoker who is not currently. Alcohol.   Social Determinants of Health   Financial Resource Strain:   . Difficulty of Paying Living Expenses:   Food Insecurity:   . Worried About Charity fundraiser in the Last Year:   . Arboriculturist in the Last Year:   Transportation Needs:   . Film/video editor (Medical):   Marland Kitchen Lack of Transportation (Non-Medical):   Physical Activity:   . Days of Exercise per Week:   . Minutes of Exercise per Session:   Stress:   . Feeling of Stress :   Social Connections:   . Frequency of Communication with Friends and Family:   . Frequency of Social Gatherings with Friends and Family:   . Attends Religious Services:   . Active Member of Clubs or Organizations:   . Attends Archivist Meetings:   Marland Kitchen Marital Status:   Intimate Partner Violence:   . Fear of Current or Ex-Partner:   . Emotionally Abused:   Marland Kitchen Physically Abused:   . Sexually Abused:    Social History   Tobacco Use  Smoking Status Former Smoker  . Packs/day: 0.50  Smokeless Tobacco Never Used  Tobacco  Comment   quit smoking in 2010   Social History   Substance and Sexual Activity  Alcohol Use No    Family History:  Family History  Problem Relation Age of Onset  . Heart disease Father   . Breast cancer Father 32  . Diabetes Father   . Heart attack Father   . Cancer Maternal  Grandmother   . Leukemia Maternal Grandfather   . Prostate cancer Maternal Uncle     Past medical history, surgical history, medications, allergies, family history and social history reviewed with patient today and changes made to appropriate areas of the chart.   Review of Systems - General ROS: negative Psychological ROS: negative Ophthalmic ROS: negative ENT ROS: negative Allergy and Immunology ROS: negative Hematological and Lymphatic ROS: negative Endocrine ROS: negative Breast ROS: negative for breast lumps Respiratory ROS: no cough, shortness of breath, or wheezing Cardiovascular ROS: no chest pain or dyspnea on exertion Gastrointestinal ROS: no abdominal pain, change in bowel habits, or black or bloody stools Genito-Urinary ROS: no dysuria, trouble voiding, or hematuria Musculoskeletal ROS: positive for - right neck, UE pain Neurological ROS: no TIA or stroke symptoms Dermatological ROS: negative All other ROS negative except what is listed above and in the HPI.      Objective:    BP 128/83   Pulse 64   Temp 98.1 F (36.7 C) (Oral)   Ht 5' 11.6" (1.819 m)   Wt 243 lb (110.2 kg)   SpO2 97%   BMI 33.33 kg/m   Wt Readings from Last 3 Encounters:  01/23/20 243 lb (110.2 kg)  12/31/19 (!) 225 lb (102.1 kg)  11/23/19 246 lb (111.6 kg)    Physical Exam Vitals and nursing note reviewed.  Constitutional:      General: He is not in acute distress.    Appearance: He is well-developed.  HENT:     Head: Atraumatic.     Right Ear: External ear normal.     Left Ear: External ear normal.     Nose: Nose normal.  Eyes:     General: No scleral icterus.    Conjunctiva/sclera: Conjunctivae  normal.     Pupils: Pupils are equal, round, and reactive to light.  Cardiovascular:     Rate and Rhythm: Normal rate and regular rhythm.     Heart sounds: Normal heart sounds. No murmur heard.   Pulmonary:     Effort: Pulmonary effort is normal. No respiratory distress.     Breath sounds: Normal breath sounds.  Abdominal:     General: Bowel sounds are normal. There is no distension.     Palpations: Abdomen is soft. There is no mass.     Tenderness: There is no abdominal tenderness. There is no guarding.  Genitourinary:    Comments: GU exam declined Musculoskeletal:        General: No tenderness. Normal range of motion.     Cervical back: Normal range of motion and neck supple.  Skin:    General: Skin is warm and dry.     Findings: No rash.  Neurological:     Mental Status: He is alert and oriented to person, place, and time.     Deep Tendon Reflexes: Reflexes are normal and symmetric.  Psychiatric:        Behavior: Behavior normal.     Results for orders placed or performed in visit on 11/23/19  Microscopic Examination   Urine  Result Value Ref Range   WBC, UA None seen 0 - 5 /hpf   RBC 3-10 (A) 0 - 2 /hpf   Epithelial Cells (non renal) 0-10 0 - 10 /hpf   Bacteria, UA None seen None seen/Few  UA/M w/rflx Culture, Routine   Specimen: Urine   Urine  Result Value Ref Range   Specific Gravity, UA 1.020 1.005 - 1.030   pH, UA  5.5 5.0 - 7.5   Color, UA Yellow Yellow   Appearance Ur Clear Clear   Leukocytes,UA Negative Negative   Protein,UA Negative Negative/Trace   Glucose, UA Negative Negative   Ketones, UA Negative Negative   RBC, UA 1+ (A) Negative   Bilirubin, UA Negative Negative   Urobilinogen, Ur 0.2 0.2 - 1.0 mg/dL   Nitrite, UA Negative Negative   Microscopic Examination See below:       Assessment & Plan:   Problem List Items Addressed This Visit      Cardiovascular and Mediastinum   DVT (deep venous thrombosis) (HCC)    Followed by Hematology, on  lovenox injections. Continue per their recommendations      Relevant Medications   atorvastatin (LIPITOR) 20 MG tablet   Other Relevant Orders   CBC with Differential/Platelet     Endocrine   IFG (impaired fasting glucose) - Primary    Diet controlled. Recheck A1C, adjust as needed. 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 Continue lipitor regimen, diet and exercise      Relevant Medications   atorvastatin (LIPITOR) 20 MG tablet   Other Relevant Orders   Lipid Panel w/o Chol/HDL Ratio    Other Visit Diagnoses    Annual physical exam       Relevant Orders   Comprehensive metabolic panel   TSH   UA/M w/rflx Culture, Routine   Right arm pain       UE DVT u/s ordered, steroid taper, heat, massage. F/u if worsening or not improving. ER precautions given additionally   Relevant Orders   US Venous Img Upper Uni Right(DVT)   Screening for prostate cancer       Need for hepatitis C screening test       Relevant Orders   Hepatitis C antibody       Discussed aspirin prophylaxis for myocardial infarction prevention and decision was it was not indicated  LABORATORY TESTING:  Health maintenance labs ordered today as discussed above.   The natural history of prostate cancer and ongoing controversy regarding screening and potential treatment outcomes of prostate cancer has been discussed with the patient. The meaning of a false positive PSA and a false negative PSA has been discussed. He indicates understanding of the limitations of this screening test and wishes not to proceed with screening PSA testing.   IMMUNIZATIONS:   - Tdap: Tetanus vaccination  status reviewed: last tetanus booster within 10 years. - Influenza: Up to date  SCREENING: - Colonoscopy: Up to date  Discussed with patient purpose of the colonoscopy is to detect colon cancer at curable precancerous or early stages   PATIENT COUNSELING:    Sexuality: Discussed sexually transmitted diseases, partner selection, use of condoms, avoidance of unintended pregnancy  and contraceptive alternatives.   Advised to avoid cigarette smoking.  I discussed with the patient that most people either abstain from alcohol or drink within safe limits (<=14/week and <=4 drinks/occasion for males, <=7/weeks and <= 3 drinks/occasion for females) and that the risk for alcohol disorders and other health effects rises proportionally with the number of drinks per week  and how often a drinker exceeds daily limits.  Discussed cessation/primary prevention of drug use and availability of treatment for abuse.   Diet: Encouraged to adjust caloric intake to maintain  or achieve ideal body weight, to reduce intake of dietary saturated fat and total fat, to limit sodium intake by avoiding high sodium foods and not adding table salt, and to maintain adequate dietary potassium and calcium preferably from fresh fruits, vegetables, and low-fat dairy products.    stressed the importance of regular exercise  Injury prevention: Discussed safety belts, safety helmets, smoke detector, smoking near bedding or upholstery.   Dental health: Discussed importance of regular tooth brushing, flossing, and dental visits.   Follow up plan: NEXT PREVENTATIVE PHYSICAL DUE IN 1 YEAR. Return in about 6 months (around 07/25/2020) for 6 month f/u.

## 2020-01-23 NOTE — Assessment & Plan Note (Signed)
Followed by Hematology, on lovenox injections. Continue per their recommendations 

## 2020-01-23 NOTE — Telephone Encounter (Signed)
Patient called repotring that his PCP thinks he may have another clot at his shoulder/ neck area and he is asking that Dr Mike Gip call him to discuss this [phone]

## 2020-01-23 NOTE — Telephone Encounter (Signed)
Re: Right arm symptoms  Patient notes symptoms for the past month.  Symptoms began in the neck, then went into right shoulder and arm.  Patient notes some swelling in arm and hand.  An ultrasound has been ordered by his provider Merrie Roof, [place]) to r/o DVT.  He was instructed to contact the clinic if the duplex has not been performed by noon tomorrow.  He is to go to the emergency room immediately if he has chest pain or shortness of breath.   Lequita Asal, MD

## 2020-01-23 NOTE — Assessment & Plan Note (Signed)
Stable, continue supplementation.

## 2020-01-24 ENCOUNTER — Telehealth: Payer: Self-pay

## 2020-01-24 ENCOUNTER — Telehealth: Payer: Self-pay | Admitting: *Deleted

## 2020-01-24 ENCOUNTER — Other Ambulatory Visit: Payer: Self-pay | Admitting: Hematology and Oncology

## 2020-01-24 DIAGNOSIS — M7989 Other specified soft tissue disorders: Secondary | ICD-10-CM

## 2020-01-24 LAB — CBC WITH DIFFERENTIAL/PLATELET
Basophils Absolute: 0 10*3/uL (ref 0.0–0.2)
Basos: 1 %
EOS (ABSOLUTE): 0.3 10*3/uL (ref 0.0–0.4)
Eos: 4 %
Hematocrit: 43.8 % (ref 37.5–51.0)
Hemoglobin: 14.8 g/dL (ref 13.0–17.7)
Immature Grans (Abs): 0 10*3/uL (ref 0.0–0.1)
Immature Granulocytes: 0 %
Lymphocytes Absolute: 2.1 10*3/uL (ref 0.7–3.1)
Lymphs: 30 %
MCH: 31.6 pg (ref 26.6–33.0)
MCHC: 33.8 g/dL (ref 31.5–35.7)
MCV: 93 fL (ref 79–97)
Monocytes Absolute: 0.8 10*3/uL (ref 0.1–0.9)
Monocytes: 12 %
Neutrophils Absolute: 3.7 10*3/uL (ref 1.4–7.0)
Neutrophils: 53 %
Platelets: 270 10*3/uL (ref 150–450)
RBC: 4.69 x10E6/uL (ref 4.14–5.80)
RDW: 12.6 % (ref 11.6–15.4)
WBC: 6.9 10*3/uL (ref 3.4–10.8)

## 2020-01-24 LAB — COMPREHENSIVE METABOLIC PANEL
ALT: 19 IU/L (ref 0–44)
AST: 15 IU/L (ref 0–40)
Albumin/Globulin Ratio: 1.3 (ref 1.2–2.2)
Albumin: 4 g/dL (ref 3.8–4.9)
Alkaline Phosphatase: 84 IU/L (ref 48–121)
BUN/Creatinine Ratio: 9 (ref 9–20)
BUN: 12 mg/dL (ref 6–24)
Bilirubin Total: <0.2 mg/dL (ref 0.0–1.2)
CO2: 24 mmol/L (ref 20–29)
Calcium: 10 mg/dL (ref 8.7–10.2)
Chloride: 102 mmol/L (ref 96–106)
Creatinine, Ser: 1.29 mg/dL — ABNORMAL HIGH (ref 0.76–1.27)
GFR calc Af Amer: 72 mL/min/{1.73_m2} (ref >59)
GFR calc non Af Amer: 62 mL/min/{1.73_m2} (ref >59)
Globulin, Total: 3.1 g/dL (ref 1.5–4.5)
Glucose: 72 mg/dL (ref 65–99)
Potassium: 4.5 mmol/L (ref 3.5–5.2)
Sodium: 140 mmol/L (ref 134–144)
Total Protein: 7.1 g/dL (ref 6.0–8.5)

## 2020-01-24 LAB — HEPATITIS C ANTIBODY: Hep C Virus Ab: <0.1 s/co ratio (ref 0.0–0.9)

## 2020-01-24 LAB — LIPID PANEL W/O CHOL/HDL RATIO
Cholesterol, Total: 241 mg/dL — ABNORMAL HIGH (ref 100–199)
HDL: 49 mg/dL (ref >39)
LDL Chol Calc (NIH): 135 mg/dL — ABNORMAL HIGH (ref 0–99)
Triglycerides: 320 mg/dL — ABNORMAL HIGH (ref 0–149)
VLDL Cholesterol Cal: 57 mg/dL — ABNORMAL HIGH (ref 5–40)

## 2020-01-24 LAB — TSH: TSH: 2.18 u[IU]/mL (ref 0.450–4.500)

## 2020-01-24 LAB — HEMOGLOBIN A1C
Est. average glucose Bld gHb Est-mCnc: 111 mg/dL
Hgb A1c MFr Bld: 5.5 % (ref 4.8–5.6)

## 2020-01-24 NOTE — Telephone Encounter (Signed)
Patient called asking to speak with Dr Mike Gip regarding the [place] that she wanted to order. Please return his call [phone]

## 2020-01-25 ENCOUNTER — Ambulatory Visit
Admission: RE | Admit: 2020-01-25 | Discharge: 2020-01-25 | Disposition: A | Discharge status: Home / Self Care | Payer: 59 | Source: Ambulatory Visit | Attending: Hematology and Oncology | Admitting: Hematology and Oncology

## 2020-01-25 ENCOUNTER — Other Ambulatory Visit: Payer: Self-pay

## 2020-01-25 DIAGNOSIS — M7989 Other specified soft tissue disorders: Secondary | ICD-10-CM | POA: Diagnosis present

## 2020-02-19 ENCOUNTER — Inpatient Hospital Stay: Payer: 59 | Attending: Hematology and Oncology

## 2020-02-19 ENCOUNTER — Inpatient Hospital Stay: Payer: 59

## 2020-02-19 ENCOUNTER — Other Ambulatory Visit: Payer: Self-pay

## 2020-02-19 DIAGNOSIS — I82B12 Acute embolism and thrombosis of left subclavian vein: Secondary | ICD-10-CM | POA: Diagnosis present

## 2020-02-19 DIAGNOSIS — R760 Raised antibody titer: Secondary | ICD-10-CM

## 2020-02-19 DIAGNOSIS — Z7901 Long term (current) use of anticoagulants: Secondary | ICD-10-CM

## 2020-02-19 LAB — COMPREHENSIVE METABOLIC PANEL
ALT: 28 U/L (ref 0–44)
AST: 19 U/L (ref 15–41)
Albumin: 3.9 g/dL (ref 3.5–5.0)
Alkaline Phosphatase: 72 U/L (ref 38–126)
Anion gap: 8 (ref 5–15)
BUN: 16 mg/dL (ref 6–20)
CO2: 28 mmol/L (ref 22–32)
Calcium: 9.3 mg/dL (ref 8.9–10.3)
Chloride: 104 mmol/L (ref 98–111)
Creatinine, Ser: 1.29 mg/dL — ABNORMAL HIGH (ref 0.61–1.24)
GFR calc Af Amer: >60 mL/min (ref >60)
GFR calc non Af Amer: >60 mL/min (ref >60)
Glucose, Bld: 104 mg/dL — ABNORMAL HIGH (ref 70–99)
Potassium: 4.4 mmol/L (ref 3.5–5.1)
Sodium: 140 mmol/L (ref 135–145)
Total Bilirubin: 0.3 mg/dL (ref 0.3–1.2)
Total Protein: 7.7 g/dL (ref 6.5–8.1)

## 2020-02-19 LAB — CBC WITH DIFFERENTIAL/PLATELET
Abs Immature Granulocytes: 0.03 10*3/uL (ref 0.00–0.07)
Basophils Absolute: 0 10*3/uL (ref 0.0–0.1)
Basophils Relative: 0 %
Eosinophils Absolute: 0.2 10*3/uL (ref 0.0–0.5)
Eosinophils Relative: 2 %
HCT: 46.2 % (ref 39.0–52.0)
Hemoglobin: 14.9 g/dL (ref 13.0–17.0)
Immature Granulocytes: 0 %
Lymphocytes Relative: 27 %
Lymphs Abs: 1.8 10*3/uL (ref 0.7–4.0)
MCH: 30.3 pg (ref 26.0–34.0)
MCHC: 32.3 g/dL (ref 30.0–36.0)
MCV: 93.9 fL (ref 80.0–100.0)
Monocytes Absolute: 0.7 10*3/uL (ref 0.1–1.0)
Monocytes Relative: 11 %
Neutro Abs: 4 10*3/uL (ref 1.7–7.7)
Neutrophils Relative %: 60 %
Platelets: 267 10*3/uL (ref 150–400)
RBC: 4.92 MIL/uL (ref 4.22–5.81)
RDW: 12.8 % (ref 11.5–15.5)
WBC: 6.8 10*3/uL (ref 4.0–10.5)
nRBC: 0 % (ref 0.0–0.2)

## 2020-02-20 ENCOUNTER — Other Ambulatory Visit: Payer: Medicare Other

## 2020-03-22 NOTE — Progress Notes (Signed)
PROVIDER NOTE: Information contained herein reflects review and annotations entered in association with encounter. Interpretation of such information and data should be left to medically-trained personnel. Information provided to patient can be located elsewhere in the medical record under "Patient Instructions". Document created using STT-dictation technology, any transcriptional errors that may result from process are unintentional.    Patient: Justin [name]  Service Category: E/M  Provider: Gaspar Cola, MD  DOB: [birth year]  DOS: 03/24/2020  Specialty: Interventional Pain Management  MRN: [id number]  Setting: Ambulatory outpatient  PCP: Volney American, PA-C  Type: Established Patient    Referring Provider: Volney American,*  Location: Office  Delivery: Face-to-face     HPI  [name], a 54 y.o. year old male, is here today because of his Chronic pain syndrome [G89.4]. [name] primary complain today is Abdominal Pain (right intercostal s/p surgery 2018) Last encounter: My last encounter with him was on 12/31/2019. Pertinent problems: 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 Pain Assessment: Severity of Chronic pain is reported as a 7 /10. Location: Rib cage Right/starts on the side and radiates around to the back. Onset: More than a month ago. Quality: Discomfort, Constant,  Aching, Throbbing, Stabbing, Jabbing. Timing: Constant. Modifying factor(s): heating pad, medications. Vitals:  height is 5' 11.5" (1.816 m) and weight is 247 lb (112 kg). His temporal temperature is 97.7 F (36.5 C). His blood pressure is 133/75 and his pulse is 69. His respiration is 16 and oxygen saturation is 99%.   Reason for encounter: medication management.  The patient indicates doing well with the current medication regimen. No adverse reactions or side effects reported to the medications.  RTCB: 06/28/2020  Pharmacotherapy Assessment   Analgesic: OxycodoneIR5 mg,1 tab PO q 8 hrs(81m/day of oxycodone) MME/day:22.594mday.   Monitoring: [place] PMP: PDMP reviewed during this encounter.       Pharmacotherapy: No side-effects or adverse reactions reported. Compliance: No problems identified. Effectiveness: Clinically acceptable.  PaJanett BillowRN  03/24/2020  9:58 AM  Sign when Signing Visit   Nursing Pain Medication Assessment:  Safety precautions to be maintained throughout the outpatient stay will include: orient to surroundings, keep bed in low position, maintain call bell within reach at all times, provide assistance with transfer out of bed and ambulation.  Medication Inspection Compliance: Pill count conducted under aseptic conditions, in front of the patient. Neither the pills nor the bottle was removed from the patient's sight at any time. Once count was completed pills were immediately returned to the patient in their original bottle.  Medication: Oxycodone IR Pill/Patch Count: 32 of 90 pills remain Pill/Patch Appearance: Markings consistent with prescribed medication Bottle Appearance: Standard pharmacy container. Clearly labeled. Filled Date: 1063 01 / 2021 Last Medication intake:  Today    UDS:  Summary  Date Value Ref Range Status  10/04/2019 Note  Final    Comment:    ==================================================================== [facility]  Select 13 (MW) ==================================================================== Test  Result       Flag       Units Drug Present and Declared for Prescription Verification   Oxycodone                      617          EXPECTED   ng/mg creat   Oxymorphone                    312          EXPECTED   ng/mg creat   Noroxycodone                   680          EXPECTED   ng/mg creat   Noroxymorphone                 55           EXPECTED   ng/mg creat    Sources of oxycodone are scheduled prescription medications.    Oxymorphone, noroxycodone, and noroxymorphone are expected    metabolites of oxycodone. Oxymorphone is also available as a    scheduled prescription medication. ==================================================================== Test                      Result    Flag   Units      Ref Range   Creatinine              163              mg/dL      >=20 ==================================================================== Declared Medications:  The flagging and interpretation on this report are based on the  following declared medications.  Unexpected results may arise from  inaccuracies in the declared medications.  **Note: The testing scope of this panel includes these medications:  Oxycodone  **Note: The testing scope of this panel does not include the  following reported medications:  Acetaminophen  Atorvastatin  Calcium  Cyclobenzaprine  Enoxaparin (Lovenox)  Gabapentin  Ibuprofen  Prednisone  Vitamin D ==================================================================== For clinical consultation, please call [phone]. ====================================================================      ROS  Constitutional: Denies any fever or chills Gastrointestinal: No reported hemesis, hematochezia, vomiting, or acute GI distress Musculoskeletal: Denies any acute onset joint swelling, redness, loss of ROM, or weakness Neurological: No  reported episodes of acute onset apraxia, aphasia, dysarthria, agnosia, amnesia, paralysis, loss of coordination, or loss of consciousness  Medication Review  GNP Calcium 1200, Turmeric, acetaminophen, atorvastatin, enoxaparin, gabapentin, ibuprofen, oxyCODONE, oxyCODONE HCl, and predniSONE  History Review  Allergy: [name] has No Known Allergies. Drug: [name]  reports no history of drug use. Alcohol:  reports no history of alcohol use. Tobacco:  reports that he has quit smoking. He smoked 0.50 packs per day. He has never used smokeless tobacco. Social: [name]  reports that he has quit smoking. He smoked 0.50 packs per day. He has never used smokeless tobacco. He reports that he does not drink alcohol and does not use drugs. Medical:  has a past medical history of Arm vein blood clot, Coronary artery disease, DVT (deep venous thrombosis) ([place]) (2012; January 2016), GERD (gastroesophageal reflux disease), Rib pain on right side (08/04/2016), and Vascular thoracic outlet syndrome. Surgical: [name]  has a past surgical history that includes Knee cartilage surgery; Carotid angiogram; Scalenotomy w/o resection cervical rib (2012); Right upper extremity thrombectomy (2012); and Colonoscopy with propofol (N/A, 10/10/2019).  Family: family history includes Breast cancer (age of onset: 70) in his father; Cancer in his maternal grandmother; Diabetes in his father; Heart attack in his father; Heart disease in his father; Leukemia in his maternal grandfather; Prostate cancer in his maternal uncle.  Laboratory Chemistry Profile   Renal Lab Results  Component Value Date   BUN 16 02/19/2020   CREATININE 1.29 (H) 02/19/2020   BCR 9 01/23/2020   GFRAA >60 02/19/2020   GFRNONAA >60 02/19/2020     Hepatic Lab Results  Component Value Date   AST 19 02/19/2020   ALT 28 02/19/2020   ALBUMIN 3.9 02/19/2020   ALKPHOS 72 02/19/2020   LIPASE 90 03/02/2014     Electrolytes Lab Results   Component Value Date   NA 140 02/19/2020   K 4.4 02/19/2020   CL 104 02/19/2020   CALCIUM 9.3 02/19/2020   MG 2.0 07/28/2017     Bone Lab Results  Component Value Date   25OHVITD1 15 (L) 06/29/2018   25OHVITD2 8.4 06/29/2018   25OHVITD3 6.8 06/29/2018     Inflammation (CRP: Acute Phase) (ESR: Chronic Phase) Lab Results  Component Value Date   CRP 12 (H) 06/29/2018   ESRSEDRATE 27 06/29/2018   LATICACIDVEN 0.9 07/20/2016       Note: Above Lab results reviewed.  Recent Imaging Review  US Venous Img Upper Uni Right(DVT) CLINICAL DATA:  54 year old male with a history of upper extremity thrombosis, new right arm swelling  EXAM: RIGHT UPPER EXTREMITY VENOUS DOPPLER ULTRASOUND  TECHNIQUE: Gray-scale sonography with graded compression, as well as color Doppler and duplex ultrasound were performed to evaluate the upper extremity deep venous system from the level of the subclavian vein and including the jugular, axillary, basilic, radial, ulnar and upper cephalic vein. Spectral Doppler was utilized to evaluate flow at rest and with distal augmentation maneuvers.  COMPARISON:  None.  FINDINGS: Contralateral Subclavian Vein: Respiratory phasicity is normal and symmetric with the symptomatic side. No evidence of thrombus. Normal compressibility.  Internal Jugular Vein: No evidence of thrombus. Normal compressibility, respiratory phasicity and response to augmentation.  Subclavian Vein: No evidence of thrombus. Normal compressibility, respiratory phasicity and response to augmentation.  Axillary Vein: No evidence of thrombus. Normal compressibility, respiratory phasicity and response to augmentation.  Cephalic Vein: No evidence of thrombus. Normal compressibility, respiratory phasicity and response to augmentation.  Basilic Vein: No evidence of thrombus. Normal compressibility, respiratory phasicity and response to augmentation.  Brachial Veins: No evidence of  thrombus. Normal compressibility, respiratory phasicity and response to augmentation.  Radial Veins: No evidence of thrombus. Normal compressibility, respiratory phasicity and response to augmentation.  Ulnar Veins: No evidence of thrombus. Normal compressibility, respiratory phasicity and response to augmentation.  Other Findings:  None visualized.  IMPRESSION: Sonographic survey of the right upper extremity negative for DVT  Electronically Signed   By: Corrie Mckusick D.O.   On: 01/25/2020 15:10 Note: Reviewed        Physical Exam  General appearance: Well nourished, well developed, and well hydrated. In no apparent acute distress Mental status: Alert, oriented x 3 (person, place, & time)       Respiratory: No evidence of acute respiratory distress Eyes: PERLA Vitals: BP 133/75 (BP Location: Left Arm, Patient Position: Sitting, Cuff Size: Large)   Pulse 69   Temp 97.7 F (36.5 C) (Temporal)   Resp 16   Ht 5' 11.5" (1.816 m)   Wt 247 lb (112 kg)   SpO2 99%   BMI  33.97 kg/m  BMI: Estimated body mass index is 33.97 kg/m as calculated from the following:   Height as of this encounter: 5' 11.5" (1.816 m).   Weight as of this encounter: 247 lb (112 kg). Ideal: Ideal body weight: 76.5 kg (168 lb 8.7 oz) Adjusted ideal body weight: 90.7 kg (199 lb 14.8 oz)  Assessment   Status Diagnosis  Controlled Controlled Controlled 1. Chronic pain syndrome   2. Chronic thoracic back pain (Primary Area of Pain) (Right)   3. Chronic low back pain (Left) with sciatica (Left)   4. Right upper quadrant abdominal pain (Tertiary Area of Pain)   5. Rib pain on right side (Fourth Area of Pain)   6. Acute pain of right shoulder   7. Vitamin D deficiency      Updated Problems: No problems updated.  Plan of Care  Problem-specific:  No problem-specific Assessment & Plan notes found for this encounter.  [name] has a current medication list which includes the following  long-term medication(s): atorvastatin, gabapentin, oxycodone hcl, [START ON 03/27/2020] gnp calcium 1200, enoxaparin, [START ON 03/30/2020] oxycodone, [START ON 04/29/2020] oxycodone, [START ON 05/29/2020] oxycodone, and [START ON 03/27/2020] turmeric.  Pharmacotherapy (Medications Ordered): Meds ordered this encounter  Medications  . oxyCODONE (OXY IR/ROXICODONE) 5 MG immediate release tablet    Sig: Take 1 tablet (5 mg total) by mouth every 8 (eight) hours as needed for severe pain. Must last 30 days    Dispense:  90 tablet    Refill:  0    Chronic Pain: STOP Act (Not applicable) Fill 1 day early if closed on refill date. Avoid benzodiazepines within 8 hours of opioids  . Turmeric 500 MG CAPS    Sig: Take 500 mg by mouth daily.    Dispense:  90 capsule    Refill:  0    OTC Recommendation. Please provide information on: where to find; how to take; side-effects; adverse reactions; drug-to-drug interactions; and contraindications. If unavailable, recommend similar substitute.  . Calcium Carbonate-Vit D-Min (GNP CALCIUM 1200) 1200-1000 MG-UNIT CHEW    Sig: Chew 1,200 mg by mouth daily with breakfast. Take in combination with vitamin D and magnesium.    Dispense:  30 tablet    Refill:  2    Fill one day early if pharmacy is closed on scheduled refill date. May substitute for generic if available.  Marland Kitchen oxyCODONE (OXY IR/ROXICODONE) 5 MG immediate release tablet    Sig: Take 1 tablet (5 mg total) by mouth every 8 (eight) hours as needed for severe pain. Must last 30 days    Dispense:  90 tablet    Refill:  0    Chronic Pain: STOP Act (Not applicable) Fill 1 day early if closed on refill date. Avoid benzodiazepines within 8 hours of opioids  . oxyCODONE (OXY IR/ROXICODONE) 5 MG immediate release tablet    Sig: Take 1 tablet (5 mg total) by mouth every 8 (eight) hours as needed for severe pain. Must last 30 days    Dispense:  90 tablet    Refill:  0    Chronic Pain: STOP Act (Not applicable) Fill  1 day early if closed on refill date. Avoid benzodiazepines within 8 hours of opioids   Orders:  No orders of the defined types were placed in this encounter.  Follow-up plan:   Return in about 3 months (around 06/28/2020) for (F2F), (Med Mgmt).      Interventional management options:  Considering:   Diagnostic  right thoracicSNRB Diagnosticright thoracicfacetblock Possible right thoracic facet medial branch RFA   Palliative PRN treatment(s):   Palliative right T6, T7, T8, &T9intercostal nerve block  Palliative right T6, T7, T8, &T9intercostal nerve RFA #2 (Last done 12/29/17)         Recent Visits Date Type Provider Dept  12/31/19 Office Visit Milinda Pointer, MD [facility]  Showing recent visits within past 90 days and meeting all other requirements Today's Visits Date Type Provider Dept  03/24/20 Office Visit Milinda Pointer, MD [facility]  Showing today's visits and meeting all other requirements Future Appointments No visits were found meeting these conditions. Showing future appointments within next 90 days and meeting all other requirements  I discussed the assessment and treatment plan with the patient. The patient was provided an opportunity to ask questions and all were answered. The patient agreed with the plan and demonstrated an understanding of the instructions.  Patient advised to call back or seek an in-person evaluation if the symptoms or condition worsens.  Duration of encounter: 30 minutes.  Note by: Gaspar Cola, MD Date: 03/24/2020; Time: 10:10 AM

## 2020-03-22 NOTE — Patient Instructions (Signed)
____________________________________________________________________________________________  Medication Rules  Purpose: To inform patients, and their family members, of our rules and regulations.  Applies to: All patients receiving prescriptions (written or electronic).  Pharmacy of record: Pharmacy where electronic prescriptions will be sent. If written prescriptions are taken to a different pharmacy, please inform the nursing staff. The pharmacy listed in the electronic medical record should be the one where you would like electronic prescriptions to be sent.  Electronic prescriptions: In compliance with the [place] Strengthen Opioid Misuse Prevention (STOP) Act of 2017 (Session Law 2017-74/H243), effective June 07, 2018, all controlled substances must be electronically prescribed. Calling prescriptions to the pharmacy will cease to exist.  Prescription refills: Only during scheduled appointments. Applies to all prescriptions.  NOTE: The following applies primarily to controlled substances (Opioid* Pain Medications).   Type of encounter (visit): For patients receiving controlled substances, face-to-face visits are required. (Not an option or up to the patient.)  Patient's responsibilities: 1. Pain Pills: Bring all pain pills to every appointment (except for procedure appointments). 2. Pill Bottles: Bring pills in original pharmacy bottle. Always bring the newest bottle. Bring bottle, even if empty. 3. Medication refills: You are responsible for knowing and keeping track of what medications you take and those you need refilled. The day before your appointment: write a list of all prescriptions that need to be refilled. The day of the appointment: give the list to the admitting nurse. Prescriptions will be written only during appointments. No prescriptions will be written on procedure days. If you forget a medication: it will not be "Called in", "Faxed", or "electronically sent".  You will need to get another appointment to get these prescribed. No early refills. Do not call asking to have your prescription filled early. 4. Prescription Accuracy: You are responsible for carefully inspecting your prescriptions before leaving our office. Have the discharge nurse carefully go over each prescription with you, before taking them home. Make sure that your name is accurately spelled, that your address is correct. Check the name and dose of your medication to make sure it is accurate. Check the number of pills, and the written instructions to make sure they are clear and accurate. Make sure that you are given enough medication to last until your next medication refill appointment. 5. Taking Medication: Take medication as prescribed. When it comes to controlled substances, taking less pills or less frequently than prescribed is permitted and encouraged. Never take more pills than instructed. Never take medication more frequently than prescribed.  6. Inform other Doctors: Always inform, all of your healthcare providers, of all the medications you take. 7. Pain Medication from other Providers: You are not allowed to accept any additional pain medication from any other Doctor or Healthcare provider. There are two exceptions to this rule. (see below) In the event that you require additional pain medication, you are responsible for notifying us, as stated below. 8. Medication Agreement: You are responsible for carefully reading and following our Medication Agreement. This must be signed before receiving any prescriptions from our practice. Safely store a copy of your signed Agreement. Violations to the Agreement will result in no further prescriptions. (Additional copies of our Medication Agreement are available upon request.) 9. Laws, Rules, & Regulations: All patients are expected to follow all Federal and State Laws, Statutes, Rules, & Regulations. Ignorance of the Laws does not constitute a  valid excuse.  10. Illegal drugs and Controlled Substances: The use of illegal substances (including, but not limited to marijuana and its   derivatives) and/or the illegal use of any controlled substances is strictly prohibited. Violation of this rule may result in the immediate and permanent discontinuation of any and all prescriptions being written by our practice. The use of any illegal substances is prohibited. 11. Adopted CDC guidelines & recommendations: Target dosing levels will be at or below 60 MME/day. Use of benzodiazepines** is not recommended.  Exceptions: There are only two exceptions to the rule of not receiving pain medications from other Healthcare Providers. 1. Exception #1 (Emergencies): In the event of an emergency (i.e.: accident requiring emergency care), you are allowed to receive additional pain medication. However, you are responsible for: As soon as you are able, call our office [phone], at any time of the day or night, and leave a message stating your name, the date and nature of the emergency, and the name and dose of the medication prescribed. In the event that your call is answered by a member of our staff, make sure to document and save the date, time, and the name of the person that took your information.  2. Exception #2 (Planned Surgery): In the event that you are scheduled by another doctor or dentist to have any type of surgery or procedure, you are allowed (for a period no longer than 30 days), to receive additional pain medication, for the acute post-op pain. However, in this case, you are responsible for picking up a copy of our "Post-op Pain Management for Surgeons" handout, and giving it to your surgeon or dentist. This document is available at our office, and does not require an appointment to obtain it. Simply go to our office during business hours (Monday-Thursday from 8:00 AM to 4:00 PM) (Friday 8:00 AM to 12:00 Noon) or if you have a scheduled appointment  with us, prior to your surgery, and ask for it by name. In addition, you are responsible for: calling our office [phone], at any time of the day or night, and leaving a message stating your name, name of your surgeon, type of surgery, and date of procedure or surgery. Failure to comply with your responsibilities may result in termination of therapy involving the controlled substances.  *Opioid medications include: morphine, codeine, oxycodone, oxymorphone, hydrocodone, hydromorphone, meperidine, tramadol, tapentadol, buprenorphine, fentanyl, methadone. **Benzodiazepine medications include: diazepam (Valium), alprazolam (Xanax), clonazepam (Klonopine), lorazepam (Ativan), clorazepate (Tranxene), chlordiazepoxide (Librium), estazolam (Prosom), oxazepam (Serax), temazepam (Restoril), triazolam (Halcion) (Last updated: 02/12/2020) ____________________________________________________________________________________________   ____________________________________________________________________________________________  Medication Recommendations and Reminders  Applies to: All patients receiving prescriptions (written and/or electronic).  Medication Rules & Regulations: These rules and regulations exist for your safety and that of others. They are not flexible and neither are we. Dismissing or ignoring them will be considered "non-compliance" with medication therapy, resulting in complete and irreversible termination of such therapy. (See document titled "Medication Rules" for more details.) In all conscience, because of safety reasons, we cannot continue providing a therapy where the patient does not follow instructions.  Pharmacy of record:   Definition: This is the pharmacy where your electronic prescriptions will be sent.   We do not endorse any particular pharmacy, however, we have experienced problems with [facility] not securing enough medication supply for the community.  We do not  restrict you in your choice of pharmacy. However, once we write for your prescriptions, we will NOT be re-sending more prescriptions to fix restricted supply problems created by your pharmacy, or your insurance.   The pharmacy listed in the electronic medical record should be the   one where you want electronic prescriptions to be sent.  If you choose to change pharmacy, simply notify our nursing staff.  Recommendations:  Keep all of your pain medications in a safe place, under lock and key, even if you live alone. We will NOT replace lost, stolen, or damaged medication.  After you fill your prescription, take 1 week's worth of pills and put them away in a safe place. You should keep a separate, properly labeled bottle for this purpose. The remainder should be kept in the original bottle. Use this as your primary supply, until it runs out. Once it's gone, then you know that you have 1 week's worth of medicine, and it is time to come in for a prescription refill. If you do this correctly, it is unlikely that you will ever run out of medicine.  To make sure that the above recommendation works, it is very important that you make sure your medication refill appointments are scheduled at least 1 week before you run out of medicine. To do this in an effective manner, make sure that you do not leave the office without scheduling your next medication management appointment. Always ask the nursing staff to show you in your prescription , when your medication will be running out. Then arrange for the receptionist to get you a return appointment, at least 7 days before you run out of medicine. Do not wait until you have 1 or 2 pills left, to come in. This is very poor planning and does not take into consideration that we may need to cancel appointments due to bad weather, sickness, or emergencies affecting our staff.  DO NOT ACCEPT A "Partial Fill": If for any reason your pharmacy does not have enough pills/tablets  to completely fill or refill your prescription, do not allow for a "partial fill". The law allows the pharmacy to complete that prescription within 72 hours, without requiring a new prescription. If they do not fill the rest of your prescription within those 72 hours, you will need a separate prescription to fill the remaining amount, which we will NOT provide. If the reason for the partial fill is your insurance, you will need to talk to the pharmacist about payment alternatives for the remaining tablets, but again, DO NOT ACCEPT A PARTIAL FILL, unless you can trust your pharmacist to obtain the remainder of the pills within 72 hours.  Prescription refills and/or changes in medication(s):   Prescription refills, and/or changes in dose or medication, will be conducted only during scheduled medication management appointments. (Applies to both, written and electronic prescriptions.)  No refills on procedure days. No medication will be changed or started on procedure days. No changes, adjustments, and/or refills will be conducted on a procedure day. Doing so will interfere with the diagnostic portion of the procedure.  No phone refills. No medications will be "called into the pharmacy".  No Fax refills.  No weekend refills.  No Holliday refills.  No after hours refills.  Remember:  Business hours are:  Monday to Thursday 8:00 AM to 4:00 PM Provider's Schedule: Milinda Pointer, MD - Appointments are:  Medication management: Monday and Wednesday 8:00 AM to 4:00 PM Procedure day: Tuesday and Thursday 7:30 AM to 4:00 PM Gillis Santa, MD - Appointments are:  Medication management: Tuesday and Thursday 8:00 AM to 4:00 PM Procedure day: Monday and Wednesday 7:30 AM to 4:00 PM (Last update: 12/26/2019) ____________________________________________________________________________________________   ____________________________________________________________________________________________  CBD  (cannabidiol) WARNING  Applicable to: All individuals currently  taking or considering taking CBD (cannabidiol) and, more important, all patients taking opioid analgesic controlled substances (pain medication). (Example: oxycodone; oxymorphone; hydrocodone; hydromorphone; morphine; methadone; tramadol; tapentadol; fentanyl; buprenorphine; butorphanol; dextromethorphan; meperidine; codeine; etc.)  Legal status: CBD remains a Schedule I drug prohibited for any use. CBD is illegal with one exception. In the [place], CBD has a limited Transport planner (FDA) approval for the treatment of two specific types of epilepsy disorders. Only one CBD product has been approved by the FDA for this purpose: "Epidiolex". FDA is aware that some companies are marketing products containing cannabis and cannabis-derived compounds in ways that violate the [employer], Drug and Cosmetic Act [facility] Act) and that may put the health and safety of consumers at risk. The FDA, a Federal agency, has not enforced the CBD status since 2018.   Legality: Some manufacturers ship CBD products nationally, which is illegal. Often such products are sold online and are therefore available throughout the country. CBD is openly sold in head shops and health food stores in some states where such sales have not been explicitly legalized. Selling unapproved products with unsubstantiated therapeutic claims is not only a violation of the law, but also can put patients at risk, as these products have not been proven to be safe or effective. Federal illegality makes it difficult to conduct research on CBD.  Reference: "FDA Regulation of Cannabis and Cannabis-Derived Products, Including Cannabidiol (CBD)" - [employer]  Warning: CBD is not FDA approved and has not undergo the same manufacturing controls as prescription  drugs.  This means that the purity and safety of available CBD may be questionable. Most of the time, despite manufacturer's claims, it is contaminated with THC (delta-9-tetrahydrocannabinol - the chemical in marijuana responsible for the "HIGH").  When this is the case, the [facility] contaminant will trigger a positive urine drug screen (UDS) test for Marijuana (carboxy-THC). Because a positive UDS for any illicit substance is a violation of our medication agreement, your opioid analgesics (pain medicine) may be permanently discontinued.  MORE ABOUT CBD  General Information: CBD  is a derivative of the Marijuana (cannabis sativa) plant discovered in 42. It is one of the 113 identified substances found in Marijuana. It accounts for up to 40% of the plant's extract. As of 2018, preliminary clinical studies on CBD included research for the treatment of anxiety, movement disorders, and pain. CBD is available and consumed in multiple forms, including inhalation of smoke or vapor, as an aerosol spray, and by mouth. It may be supplied as an oil containing CBD, capsules, dried cannabis, or as a liquid solution. CBD is thought not to be as psychoactive as THC (delta-9-tetrahydrocannabinol - the chemical in marijuana responsible for the "HIGH"). Studies suggest that CBD may interact with different biological target receptors in the body, including cannabinoid and other neurotransmitter receptors. As of 2018 the mechanism of action for its biological effects has not been determined.  Side-effects  Adverse reactions: Dry mouth, diarrhea, decreased appetite, fatigue, drowsiness, malaise, weakness, sleep disturbances, and others.  Drug interactions: CBC may interact with other medications such as blood-thinners. (Last update: 01/12/2020) ____________________________________________________________________________________________

## 2020-03-24 ENCOUNTER — Encounter: Payer: Self-pay | Admitting: Pain Medicine

## 2020-03-24 ENCOUNTER — Ambulatory Visit: Payer: 59 | Attending: Pain Medicine | Admitting: Pain Medicine

## 2020-03-24 ENCOUNTER — Other Ambulatory Visit: Payer: Self-pay

## 2020-03-24 VITALS — BP 133/75 | HR 69 | Temp 97.7°F | Resp 16 | Ht 71.5 in | Wt 247.0 lb

## 2020-03-24 DIAGNOSIS — R1011 Right upper quadrant pain: Secondary | ICD-10-CM

## 2020-03-24 DIAGNOSIS — G894 Chronic pain syndrome: Secondary | ICD-10-CM | POA: Diagnosis present

## 2020-03-24 DIAGNOSIS — M546 Pain in thoracic spine: Secondary | ICD-10-CM | POA: Insufficient documentation

## 2020-03-24 DIAGNOSIS — M5442 Lumbago with sciatica, left side: Secondary | ICD-10-CM | POA: Insufficient documentation

## 2020-03-24 DIAGNOSIS — E559 Vitamin D deficiency, unspecified: Secondary | ICD-10-CM | POA: Diagnosis present

## 2020-03-24 DIAGNOSIS — R0781 Pleurodynia: Secondary | ICD-10-CM

## 2020-03-24 DIAGNOSIS — G8929 Other chronic pain: Secondary | ICD-10-CM | POA: Diagnosis present

## 2020-03-24 DIAGNOSIS — M25511 Pain in right shoulder: Secondary | ICD-10-CM | POA: Insufficient documentation

## 2020-03-24 MED ORDER — GNP CALCIUM 1200 1200-1000 MG-UNIT PO CHEW: 1200.0000 mg | CHEWABLE_TABLET | Freq: Every day | ORAL | 2 refills | Status: AC

## 2020-03-24 MED ORDER — TURMERIC 500 MG PO CAPS: 500.0000 mg | ORAL_CAPSULE | Freq: Every day | ORAL | 0 refills | Status: DC

## 2020-03-24 MED ORDER — OXYCODONE HCL 5 MG PO TABS: 5.0000 mg | ORAL_TABLET | Freq: Three times a day (TID) | ORAL | 0 refills | Status: DC | PRN

## 2020-03-24 NOTE — Progress Notes (Signed)
   Nursing Pain Medication Assessment:  Safety precautions to be maintained throughout the outpatient stay will include: orient to surroundings, keep bed in low position, maintain call bell within reach at all times, provide assistance with transfer out of bed and ambulation.  Medication Inspection Compliance: Pill count conducted under aseptic conditions, in front of the patient. Neither the pills nor the bottle was removed from the patient's sight at any time. Once count was completed pills were immediately returned to the patient in their original bottle.  Medication: Oxycodone IR Pill/Patch Count: 32 of 90 pills remain Pill/Patch Appearance: Markings consistent with prescribed medication Bottle Appearance: Standard pharmacy container. Clearly labeled. Filled Date: 30 / 01 / 2021 Last Medication intake:  Today

## 2020-05-16 ENCOUNTER — Other Ambulatory Visit: Payer: Self-pay

## 2020-05-20 NOTE — Progress Notes (Signed)
[facility]  [address], [address] [place], [place] [address] Phone: [phone]  Fax: [phone]   Clinic Day:  05/21/2020  Referring physician: Volney American,*  Chief Complaint: Justin [name] is a 54 y.o. male with recurrent thrombosis who is seen for 6 month assessment.   HPI: The patient was last seen in the hematology clinic on 11/21/2019. At that time, he continued to have chest wall pain since his surgery.  Exam was stable. Hematocrit was 43.6, hemoglobin 14.3, platelets 265,000, WBC 8,300. Creatinine was 1.28.   Bone density on 12/11/2019 was normal with a T score of -0.3 at the left femur neck.  The patient saw Justin [name], [place] on 01/23/2020. He reported right arm pain. Right upper extremity venous ultrasound on 01/25/2020 was negative for DVT.  Labs on 02/19/2020 revealed a hematocrit of 46.2, hemoglobin 14.9, platelets 267,000, WBC 6,800. Creatinine was 1.29.  During the interim, he has been "alright." Since his surgery several years ago, he has not been able to be as active as he would like due to right rib tenderness. He has spoke to his surgeon and they discussed going back in to repair the area. His pain is felt to be due to a nerve that grew back incorrectly. The patient states that he does not want to be on oxycodone for his whole life.  The patient still bruises easily. His back pain and knee pain have resolved.  He would like a referral to urology for erectile dysfunction.   Past Medical History:  Diagnosis Date  . Arm vein blood clot    right  . Coronary artery disease   . DVT (deep venous thrombosis) ([place]) 2012; January 2016   History of right arm; now right lower shotty  . GERD (gastroesophageal reflux disease)   . Rib pain on right side 08/04/2016   RT rib pain that radiates around to the back. Level 8 of 10.  Constant. Started 1 mo ago, 1 week after DVT.  Marland Kitchen Vascular thoracic outlet syndrome    Right     Past  Surgical History:  Procedure Laterality Date  . CAROTID ANGIOGRAM    . COLONOSCOPY WITH PROPOFOL N/A 10/10/2019   Procedure: COLONOSCOPY WITH PROPOFOL;  Surgeon: Jonathon Bellows, MD;  Location: [facility] ENDOSCOPY;  Service: Gastroenterology;  Laterality: N/A;  . KNEE CARTILAGE SURGERY     Performed for cracked patella and torn meniscus  . Right upper extremity thrombectomy  2012  . SCALENOTOMY W/O RESECTION CERVICAL RIB  2012   4 right-sided thoracic outlet syndrome    Family History  Problem Relation Age of Onset  . Heart disease Father   . Breast cancer Father 13  . Diabetes Father   . Heart attack Father   . Cancer Maternal Grandmother   . Leukemia Maternal Grandfather   . Prostate cancer Maternal Uncle     Social History:  reports that he has quit smoking. He smoked 0.50 packs per day. He has never used smokeless tobacco. He reports that he does not drink alcohol and does not use drugs. He stopped smoking in his 39s. He lives in [place]. He is a Glass blower/designer. He denies any exposure to radiation or toxins. He has a son. The patient's wife is Justin [name]. The patient is alone today.  Allergies: No Known Allergies  Current Medications: Current Outpatient Medications  Medication Sig Dispense Refill  . acetaminophen (TYLENOL) 500 MG tablet Take 1,000 mg by mouth every 6 (six) hours as  needed.    Marland Kitchen atorvastatin (LIPITOR) 20 MG tablet Take 1 tablet (20 mg total) by mouth daily. 90 tablet 1  . Calcium Carbonate-Vit D-Min (GNP CALCIUM 1200) 1200-1000 MG-UNIT CHEW Chew 1,200 mg by mouth daily with breakfast. Take in combination with vitamin D and magnesium. 30 tablet 2  . gabapentin (NEURONTIN) 600 MG tablet Take 1 tablet (600 mg total) by mouth 2 (two) times daily. 180 tablet 1  . ibuprofen (ADVIL,MOTRIN) 800 MG tablet Take 800 mg by mouth every 8 (eight) hours as needed.    Marland Kitchen oxyCODONE (OXY IR/ROXICODONE) 5 MG immediate release tablet Take 1 tablet (5 mg total) by mouth every 8 (eight) hours as  needed for severe pain. Must last 30 days 90 tablet 0  . OXYCODONE HCL PO Take 5 mg by mouth every 8 (eight) hours as needed.    . Turmeric 500 MG CAPS Take 500 mg by mouth daily. 90 capsule 0  . enoxaparin (LOVENOX) 150 MG/ML injection INJECT 0.74 MLS (110 MG TOTAL) INTO THE SKIN 2 (TWO) TIMES DAILY 133.2 mL 3  . oxyCODONE (OXY IR/ROXICODONE) 5 MG immediate release tablet Take 1 tablet (5 mg total) by mouth every 8 (eight) hours as needed for severe pain. Must last 30 days 90 tablet 0  . [START ON 05/29/2020] oxyCODONE (OXY IR/ROXICODONE) 5 MG immediate release tablet Take 1 tablet (5 mg total) by mouth every 8 (eight) hours as needed for severe pain. Must last 30 days (Patient not taking: Reported on 05/21/2020) 90 tablet 0  . predniSONE (DELTASONE) 10 MG tablet Take 6 tabs day one, 5 tabs day two, 4 tabs day three, etc (Patient not taking: No sig reported) 21 tablet 0   No current facility-administered medications for this visit.    Review of Systems  Constitutional: Negative for chills, diaphoresis, fever, malaise/fatigue and weight loss (up 7 lbs).       Feels "alright."  HENT: Negative.  Negative for congestion, ear discharge, ear pain, hearing loss, nosebleeds, sinus pain, sore throat and tinnitus.   Eyes: Negative.  Negative for blurred vision, double vision and photophobia.  Respiratory: Negative.  Negative for cough, hemoptysis, sputum production and shortness of breath.   Cardiovascular: Negative for chest pain, palpitations and leg swelling.  Gastrointestinal: Negative.  Negative for abdominal pain, blood in stool, constipation, diarrhea, heartburn, melena, nausea and vomiting.  Genitourinary: Negative.  Negative for dysuria, frequency, hematuria and urgency.  Musculoskeletal: Negative for back pain, falls, joint pain, myalgias and neck pain.       RIGHT lateral chest wall pain x 1-2 months  Skin: Negative.  Negative for itching and rash.  Neurological: Negative.  Negative for  dizziness, tremors, sensory change, focal weakness, weakness and headaches.  Endo/Heme/Allergies: Bruises/bleeds easily (on enoxaparin injections).  Psychiatric/Behavioral: Negative.  Negative for depression and memory loss. The patient is not nervous/anxious and does not have insomnia.   All other systems reviewed and are negative.  Performance status (ECOG): 0-1  Vitals Blood pressure 119/81, pulse 61, temperature (!) 96.7 F (35.9 C), temperature source Tympanic, resp. rate 16, weight 254 lb 11.9 oz (115.5 kg), SpO2 98 %.   Physical Exam Vitals and nursing note reviewed.  Constitutional:      General: He is not in acute distress.    Appearance: Normal appearance. He is well-developed. He is not diaphoretic.  HENT:     Head: Normocephalic and atraumatic.     Comments: Alopecia.    Mouth/Throat:     Mouth: Mucous  membranes are moist.     Pharynx: Oropharynx is clear. No oropharyngeal exudate.  Eyes:     General: No scleral icterus.    Extraocular Movements: Extraocular movements intact.     Conjunctiva/sclera: Conjunctivae normal.     Pupils: Pupils are equal, round, and reactive to light.     Comments: Glasses. Brown eyes.  Cardiovascular:     Rate and Rhythm: Normal rate and regular rhythm.     Heart sounds: Normal heart sounds. No murmur heard.   Pulmonary:     Effort: Pulmonary effort is normal. No respiratory distress.     Breath sounds: Normal breath sounds. No wheezing or rales.  Chest:     Chest wall: No tenderness.  Breasts:     Right: No supraclavicular adenopathy.     Left: No supraclavicular adenopathy.    Abdominal:     General: Bowel sounds are normal. There is no distension.     Palpations: Abdomen is soft. There is no mass.     Tenderness: There is no abdominal tenderness. There is no guarding or rebound.  Musculoskeletal:        General: Tenderness (right ribs) present. No swelling. Normal range of motion.     Cervical back: Normal range of motion  and neck supple.     Right lower leg: No edema.     Left lower leg: No edema.  Lymphadenopathy:     Head:     Right side of head: No preauricular, posterior auricular or occipital adenopathy.     Left side of head: No preauricular, posterior auricular or occipital adenopathy.     Cervical: No cervical adenopathy.     Upper Body:     Right upper body: No supraclavicular adenopathy.     Left upper body: No supraclavicular adenopathy.     Lower Body: No right inguinal adenopathy. No left inguinal adenopathy.  Skin:    General: Skin is warm and dry.     Coloration: Skin is not pale.     Findings: No erythema or rash.  Neurological:     Mental Status: He is alert and oriented to person, place, and time.  Psychiatric:        Behavior: Behavior normal.        Thought Content: Thought content normal.        Judgment: Judgment normal.    Appointment on 05/21/2020  Component Date Value Ref Range Status  . Sodium 05/21/2020 139  135 - 145 mmol/L Final  . Potassium 05/21/2020 4.2  3.5 - 5.1 mmol/L Final  . Chloride 05/21/2020 102  98 - 111 mmol/L Final  . CO2 05/21/2020 28  22 - 32 mmol/L Final  . Glucose, Bld 05/21/2020 109* 70 - 99 mg/dL Final   Glucose reference range applies only to samples taken after fasting for at least 8 hours.  . BUN 05/21/2020 13  6 - 20 mg/dL Final  . Creatinine, Ser 05/21/2020 1.35* 0.61 - 1.24 mg/dL Final  . Calcium 05/21/2020 9.2  8.9 - 10.3 mg/dL Final  . Total Protein 05/21/2020 7.5  6.5 - 8.1 g/dL Final  . Albumin 05/21/2020 3.8  3.5 - 5.0 g/dL Final  . AST 05/21/2020 17  15 - 41 U/L Final  . ALT 05/21/2020 23  0 - 44 U/L Final  . Alkaline Phosphatase 05/21/2020 75  38 - 126 U/L Final  . Total Bilirubin 05/21/2020 0.5  0.3 - 1.2 mg/dL Final  . GFR, Estimated 05/21/2020 >60  >60  mL/min Final   Comment: (NOTE) Calculated using the CKD-EPI Creatinine Equation (2021)   . Anion gap 05/21/2020 9  5 - 15 Final   Performed at [facility],  [address], [place], [place] [address]  . WBC 05/21/2020 9.4  4.0 - 10.5 K/uL Final  . RBC 05/21/2020 4.93  4.22 - 5.81 MIL/uL Final  . Hemoglobin 05/21/2020 15.0  13.0 - 17.0 g/dL Final  . HCT 05/21/2020 46.0  39.0 - 52.0 % Final  . MCV 05/21/2020 93.3  80.0 - 100.0 fL Final  . MCH 05/21/2020 30.4  26.0 - 34.0 pg Final  . MCHC 05/21/2020 32.6  30.0 - 36.0 g/dL Final  . RDW 05/21/2020 12.3  11.5 - 15.5 % Final  . Platelets 05/21/2020 269  150 - 400 K/uL Final  . nRBC 05/21/2020 0.0  0.0 - 0.2 % Final  . Neutrophils Relative % 05/21/2020 64  % Final  . Neutro Abs 05/21/2020 5.9  1.7 - 7.7 K/uL Final  . Lymphocytes Relative 05/21/2020 25  % Final  . Lymphs Abs 05/21/2020 2.3  0.7 - 4.0 K/uL Final  . Monocytes Relative 05/21/2020 9  % Final  . Monocytes Absolute 05/21/2020 0.8  0.1 - 1.0 K/uL Final  . Eosinophils Relative 05/21/2020 2  % Final  . Eosinophils Absolute 05/21/2020 0.2  0.0 - 0.5 K/uL Final  . Basophils Relative 05/21/2020 0  % Final  . Basophils Absolute 05/21/2020 0.0  0.0 - 0.1 K/uL Final  . WBC Morphology 05/21/2020 SMUDGE CELLS   Final  . RBC Morphology 05/21/2020 NO SCHISTOCYTES SEEN   Final  . Smear Review 05/21/2020 Reviewed   Final  . Immature Granulocytes 05/21/2020 0  % Final  . Abs Immature Granulocytes 05/21/2020 0.04  0.00 - 0.07 K/uL Final  . Reactive, Benign Lymphocytes 05/21/2020 PRESENT   Final  . Giant PLTs 05/21/2020 PRESENT   Final   Performed at [facility], [address] [place],  [address]    Assessment:  ESCO JOSLYN is a 54 y.o. male with recurrent thrombosisx 4. He developed a right upper extremity clotin 2010. He underwent thrombolysis. He was on Coumadin x 6 months. He developed a recurrent clot in the right upper extremityin 2011. He underwent thoracic outlet decompression. He was on Coumadin for 6-9 months. He developed a right lower extremity DVTin 2016. He was started on Xarelto.  He developed aleft  upper extremity DVTon 06/07/2016 while on Xarelto. Duplex revealed a near occlusive DVT in the lateral aspect of the left subclavian vein and nonocclusive thrombus extending into the adjacent axillary vein. He was started on Lovenox.  Hypercoagulable work-upon 06/29/2016 revealed the following normal studies: CBC with diff, Factor V Leiden, prothrombin gene mutation, protein C antigen and activity, protein S antigen and activity, ATIII antigen and activity, anticardiolipin antibodies and beta-2 glycoprotein antibodies. Lupus anticoagulant panelwas positive on Xarelto and Lovenox.  Repeat testing was positive on 08/21/2019 and 10/09/2019.  Chest CT angiogramon 07/02/2016 revealed no evidence of pulmonary embolism. There were small pulmonary nodules, largest 3 mm, of unclear significance.  Bone scan on 08/04/2016 revealed a focus of moderately increased uptakeat approximately the T9right paravertebral region. Chest CTon 07/20/2016 revealed a large right sided osteophyte at the T9-T10 level which was felt to explain the increased right paravertebral uptake uptake. There was also mildly increased uptake in the body of L2. Review of the lumbar spine on the abdominal CT scan of the same date revealed no  definite radiographic abnormality in the body of L2 to explain the mildly increased uptake. There was no abnormal uptake within the ribs or elsewhere within the skeleton.  Thoracic spine MRIon 09/14/2016 revealed mild degenerative disc disease in the thoracic spine with non-compressive disc bulges at T5-6 and below. There was no disc herniation or compressive stenosis of the canal or foramina. 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 On the right at T9-10, there was fairly bulky osteophytesemanating from the right costovertebral articulation. There was no evidence of neural compression secondary to this or the other  bony degenerative changes. There was ordinary mild facet osteoarthritis without edematous change or significant hypertrophy.  He underwent right lateral mini-thoracotomyfor resection of a bony rib lesion on 01/14/2017. Notes indicate he underwent thoracic discectomy with decompression by anterior approach. Pathology revealed an "exophytic lesion". He has had right sided rib pain post-operatively.  Bone densityon 12/06/2017 revealed a normal T score of -0.6 in the right femoral neck.  Colonoscopy on 10/10/2019 was normal.  Patient received the COVID-19 vaccine.  Symptomatically, he has been "alright." Since his surgery several years ago, he has not been able to be as active due to right rib tenderness. He bruises easily. Back pain and knee pain have resolved.  He has erectile dysfunction.  Plan: 1.   Labs today: CBC with diff, CMP. 2.   Recurrent thrombosis Clinically, he is doing well without recurrent thrombosis. Right upper extremity duplex on 01/25/2020 was negative. He continues Lovenox SQ every 12 hours.  Continue every 2-year bone density testing while on Lovenox.  Bone density on 12/06/2017 was normal. 3.   Right chest wall pain  Patient continues to have chest wall pain s/p surgery  Courage follow-up with surgery.  If surgery planned, repeat lupus anticoagulant panel off Lovenox. 4.   Erectile dysfunction  Urology referral. 5.   RTC in 3 months for labs (CBC with diff, CMP). 6.   RTC in 6 months for MD assessment and labs (CBC with diff, CMP).  I discussed the assessment and treatment plan with the patient.  The patient was provided an opportunity to ask questions and all were answered.  The patient agreed with the plan and demonstrated an understanding of the instructions.  The patient was advised to call back if the symptoms worsen or if the condition fails to improve as anticipated.   Lequita Asal, MD, PhD    05/21/2020, 1:50 PM  I, Mirian Mo Tufford, am  acting as Education administrator for [employer]. Mike Gip, MD, PhD.  I, Gianna Calef C. Mike Gip, MD, have reviewed the above documentation for accuracy and completeness, and I agree with the above.

## 2020-05-21 ENCOUNTER — Other Ambulatory Visit: Payer: Self-pay

## 2020-05-21 ENCOUNTER — Inpatient Hospital Stay: Payer: 59

## 2020-05-21 ENCOUNTER — Inpatient Hospital Stay: Payer: 59 | Attending: Hematology and Oncology | Admitting: Hematology and Oncology

## 2020-05-21 VITALS — BP 119/81 | HR 61 | Temp 96.7°F | Resp 16 | Wt 254.7 lb

## 2020-05-21 DIAGNOSIS — R760 Raised antibody titer: Secondary | ICD-10-CM

## 2020-05-21 DIAGNOSIS — Z7901 Long term (current) use of anticoagulants: Secondary | ICD-10-CM

## 2020-05-21 DIAGNOSIS — I82B12 Acute embolism and thrombosis of left subclavian vein: Secondary | ICD-10-CM | POA: Diagnosis not present

## 2020-05-21 DIAGNOSIS — N529 Male erectile dysfunction, unspecified: Secondary | ICD-10-CM

## 2020-05-21 DIAGNOSIS — Z86718 Personal history of other venous thrombosis and embolism: Secondary | ICD-10-CM | POA: Diagnosis present

## 2020-05-21 LAB — CBC WITH DIFFERENTIAL/PLATELET
Abs Immature Granulocytes: 0.04 10*3/uL (ref 0.00–0.07)
Basophils Absolute: 0 10*3/uL (ref 0.0–0.1)
Basophils Relative: 0 %
Eosinophils Absolute: 0.2 10*3/uL (ref 0.0–0.5)
Eosinophils Relative: 2 %
HCT: 46 % (ref 39.0–52.0)
Hemoglobin: 15 g/dL (ref 13.0–17.0)
Immature Granulocytes: 0 %
Lymphocytes Relative: 25 %
Lymphs Abs: 2.3 10*3/uL (ref 0.7–4.0)
MCH: 30.4 pg (ref 26.0–34.0)
MCHC: 32.6 g/dL (ref 30.0–36.0)
MCV: 93.3 fL (ref 80.0–100.0)
Monocytes Absolute: 0.8 10*3/uL (ref 0.1–1.0)
Monocytes Relative: 9 %
Neutro Abs: 5.9 10*3/uL (ref 1.7–7.7)
Neutrophils Relative %: 64 %
Platelets: 269 10*3/uL (ref 150–400)
RBC Morphology: NONE SEEN
RBC: 4.93 MIL/uL (ref 4.22–5.81)
RDW: 12.3 % (ref 11.5–15.5)
WBC: 9.4 10*3/uL (ref 4.0–10.5)
nRBC: 0 % (ref 0.0–0.2)

## 2020-05-21 LAB — COMPREHENSIVE METABOLIC PANEL
ALT: 23 U/L (ref 0–44)
AST: 17 U/L (ref 15–41)
Albumin: 3.8 g/dL (ref 3.5–5.0)
Alkaline Phosphatase: 75 U/L (ref 38–126)
Anion gap: 9 (ref 5–15)
BUN: 13 mg/dL (ref 6–20)
CO2: 28 mmol/L (ref 22–32)
Calcium: 9.2 mg/dL (ref 8.9–10.3)
Chloride: 102 mmol/L (ref 98–111)
Creatinine, Ser: 1.35 mg/dL — ABNORMAL HIGH (ref 0.61–1.24)
GFR, Estimated: >60 mL/min (ref >60)
Glucose, Bld: 109 mg/dL — ABNORMAL HIGH (ref 70–99)
Potassium: 4.2 mmol/L (ref 3.5–5.1)
Sodium: 139 mmol/L (ref 135–145)
Total Bilirubin: 0.5 mg/dL (ref 0.3–1.2)
Total Protein: 7.5 g/dL (ref 6.5–8.1)

## 2020-05-21 NOTE — Progress Notes (Signed)
Patient having an achy and tender pain on right ribcage/breast area.

## 2020-05-21 NOTE — Patient Instructions (Signed)
  Please call clinic if any excess bruising or bleeding.

## 2020-06-02 ENCOUNTER — Other Ambulatory Visit: Payer: Self-pay

## 2020-06-02 ENCOUNTER — Encounter: Payer: Self-pay | Admitting: Urology

## 2020-06-02 ENCOUNTER — Ambulatory Visit (INDEPENDENT_AMBULATORY_CARE_PROVIDER_SITE_OTHER): Payer: 59 | Admitting: Urology

## 2020-06-02 VITALS — BP 148/70 | HR 68 | Ht 71.0 in | Wt 250.0 lb

## 2020-06-02 DIAGNOSIS — R300 Dysuria: Secondary | ICD-10-CM

## 2020-06-02 DIAGNOSIS — N5201 Erectile dysfunction due to arterial insufficiency: Secondary | ICD-10-CM | POA: Diagnosis not present

## 2020-06-02 MED ORDER — TADALAFIL 20 MG PO TABS: ORAL_TABLET | ORAL | 0 refills | Status: DC

## 2020-06-02 NOTE — Progress Notes (Signed)
06/02/2020 11:07 AM   Justin [name] 12-Nov-1965 [id number]  Referring provider: Volney American, PA-C [address] [place],  [place] [address]  Chief Complaint  Patient presents with  . Erectile Dysfunction    HPI: Justin [name] is a 54 y.o. male referred for erectile dysfunction   Saw Dr. Diamantina Providence 10/2018 for dysuria  Complains of erectile dysfunction for 6-7 months  Has difficulty achieving an erection  Is able to achieve penetration ~50% of the time however does have difficulty maintaining the erection  No pain or curvature with erections  SHIM 13/25 indicating mild-moderate ED  Organic risk factors include coronary artery disease hyperlipidemia and previous tobacco history  Has had no treatment for his symptoms  2 days ago developed recurrent dysuria which described as mild  Denies gross hematuria  Urinalysis 01/2020 was negative   PMH: Past Medical History:  Diagnosis Date  . Arm vein blood clot    right  . Coronary artery disease   . DVT (deep venous thrombosis) ([place]) 2012; January 2016   History of right arm; now right lower shotty  . GERD (gastroesophageal reflux disease)   . Rib pain on right side 08/04/2016   RT rib pain that radiates around to the back. Level 8 of 10.  Constant. Started 1 mo ago, 1 week after DVT.  Marland Kitchen Vascular thoracic outlet syndrome    Right     Surgical History: Past Surgical History:  Procedure Laterality Date  . CAROTID ANGIOGRAM    . COLONOSCOPY WITH PROPOFOL N/A 10/10/2019   Procedure: COLONOSCOPY WITH PROPOFOL;  Surgeon: Jonathon Bellows, MD;  Location: [facility] ENDOSCOPY;  Service: Gastroenterology;  Laterality: N/A;  . KNEE CARTILAGE SURGERY     Performed for cracked patella and torn meniscus  . Right upper extremity thrombectomy  2012  . SCALENOTOMY W/O RESECTION CERVICAL RIB  2012   4 right-sided thoracic outlet syndrome    Home Medications:  Allergies as of 06/02/2020   No Known Allergies     Medication List        Accurate as of June 02, 2020 11:07 AM. If you have any questions, ask your nurse or doctor.        acetaminophen 500 MG tablet Commonly known as: TYLENOL Take 1,000 mg by mouth every 6 (six) hours as needed.   atorvastatin 20 MG tablet Commonly known as: LIPITOR Take 1 tablet (20 mg total) by mouth daily.   enoxaparin 150 MG/ML injection Commonly known as: LOVENOX INJECT 0.74 MLS (110 MG TOTAL) INTO THE SKIN 2 (TWO) TIMES DAILY   gabapentin 600 MG tablet Commonly known as: Neurontin Take 1 tablet (600 mg total) by mouth 2 (two) times daily.   GNP Calcium 1200 1200-1000 MG-UNIT Chew Chew 1,200 mg by mouth daily with breakfast. Take in combination with vitamin D and magnesium.   ibuprofen 800 MG tablet Commonly known as: ADVIL Take 800 mg by mouth every 8 (eight) hours as needed.   OXYCODONE HCL PO Take 5 mg by mouth every 8 (eight) hours as needed.   oxyCODONE 5 MG immediate release tablet Commonly known as: Oxy IR/ROXICODONE Take 1 tablet (5 mg total) by mouth every 8 (eight) hours as needed for severe pain. Must last 30 days   oxyCODONE 5 MG immediate release tablet Commonly known as: Oxy IR/ROXICODONE Take 1 tablet (5 mg total) by mouth every 8 (eight) hours as needed for severe pain. Must last 30 days   oxyCODONE 5 MG immediate release tablet  Commonly known as: Oxy IR/ROXICODONE Take 1 tablet (5 mg total) by mouth every 8 (eight) hours as needed for severe pain. Must last 30 days   predniSONE 10 MG tablet Commonly known as: DELTASONE Take 6 tabs day one, 5 tabs day two, 4 tabs day three, etc   Turmeric 500 MG Caps Take 500 mg by mouth daily.       Allergies: No Known Allergies  Family History: Family History  Problem Relation Age of Onset  . Heart disease Father   . Breast cancer Father 28  . Diabetes Father   . Heart attack Father   . Cancer Maternal Grandmother   . Leukemia Maternal Grandfather   . Prostate cancer Maternal Uncle      Social History:  reports that he has quit smoking. He smoked 0.50 packs per day. He has never used smokeless tobacco. He reports that he does not drink alcohol and does not use drugs.   Physical Exam: BP (!) 148/70   Pulse 68   Ht 5\' 11"  (1.803 m)   Wt 250 lb (113.4 kg)   BMI 34.87 kg/m   Constitutional:  Alert and oriented, No acute distress. HEENT: [place] AT, moist mucus membranes.  Trachea midline, no masses. Cardiovascular: No clubbing, cyanosis, or edema. Respiratory: Normal respiratory effort, no increased work of breathing. Neurologic: Grossly intact, no focal deficits, moving all 4 extremities. Psychiatric: Normal mood and affect.  Laboratory Data:  Urinalysis Dipstick trace blood Microscopy negative  Assessment & Plan:    1.  Erectile dysfunction  Most likely vascular in etiology  PDE 5 inhibitor nave  Trial tadalafil 20 mg 1 hour prior to intercourse  Instructed to attempt on at least 5 occasions to adequately determine efficacy  2.  Dysuria  Urinalysis today was clear   Abbie Sons, [place] [address] [place],  [address] [phone]

## 2020-06-03 LAB — URINALYSIS, COMPLETE
Bilirubin, UA: NEGATIVE
Glucose, UA: NEGATIVE
Ketones, UA: NEGATIVE
Leukocytes,UA: NEGATIVE
Nitrite, UA: NEGATIVE
Protein,UA: NEGATIVE
Specific Gravity, UA: 1.02 (ref 1.005–1.030)
Urobilinogen, Ur: 0.2 mg/dL (ref 0.2–1.0)
pH, UA: 5.5 (ref 5.0–7.5)

## 2020-06-03 LAB — MICROSCOPIC EXAMINATION: Bacteria, UA: NONE SEEN

## 2020-06-04 ENCOUNTER — Telehealth: Payer: Self-pay | Admitting: *Deleted

## 2020-06-04 NOTE — Telephone Encounter (Signed)
Left of voice mail per [facility]

## 2020-06-04 NOTE — Telephone Encounter (Signed)
-----   Message from Riki Altes, MD sent at 06/03/2020  6:55 PM EST ----- Please let patient know urinalysis was normal

## 2020-06-15 DIAGNOSIS — N529 Male erectile dysfunction, unspecified: Secondary | ICD-10-CM | POA: Insufficient documentation

## 2020-06-19 ENCOUNTER — Encounter: Payer: Self-pay | Admitting: Pain Medicine

## 2020-06-22 NOTE — Progress Notes (Signed)
Patient: Justin [name]  Service Category: E/M  Provider: Gaspar Cola, MD  DOB: 1966-04-16  DOS: 06/23/2020  Location: Office  MRN: [id number]  Setting: Ambulatory outpatient  Referring Provider: Volney American,*  Type: Established Patient  Specialty: Interventional Pain Management  PCP: Volney American, PA-C  Location: Remote location  Delivery: TeleHealth     Virtual Encounter - Pain Management PROVIDER NOTE: Information contained herein reflects review and annotations entered in association with encounter. Interpretation of such information and data should be left to medically-trained personnel. Information provided to patient can be located elsewhere in the medical record under "Patient Instructions". Document created using STT-dictation technology, any transcriptional errors that may result from process are unintentional.    Contact & Pharmacy Preferred: [phone] Home: [phone] (home) Mobile: [phone] (mobile) E-mail: [email or web address]  [facility] [place], [place] [facility] [place] [address] [place] [address] Phone: [phone] Fax: [place], [place] [address] [place] [address] Phone: [phone] Fax: [phone]   Pre-screening  Justin [name] offered "in-person" vs "virtual" encounter. He indicated preferring virtual for this encounter.   Reason COVID-19*  Social distancing based on CDC and AMA recommendations.   I contacted Justin [name] on 06/23/2020 via telephone.      I clearly identified myself as Gaspar Cola, MD. I verified that I was speaking with the correct person using two identifiers (Name: Justin [name], and date of birth: 05/31/1966).  Consent I sought verbal advanced consent from Justin [name] for virtual visit interactions. I informed Justin [name] of possible security and privacy concerns, risks, and  limitations associated with providing "not-in-person" medical evaluation and management services. I also informed Justin [name] of the availability of "in-person" appointments. Finally, I informed him that there would be a charge for the virtual visit and that he could be  personally, fully or partially, financially responsible for it. Justin [name] expressed understanding and agreed to proceed.   Historic Elements   Justin [name] is a 55 y.o. year old, male patient evaluated today after our last contact on 03/24/2020. Justin [name]  has a past medical history of Arm vein blood clot, Coronary artery disease, DVT (deep venous thrombosis) ([place]) (2012; January 2016), GERD (gastroesophageal reflux disease), Rib pain on right side (08/04/2016), and Vascular thoracic outlet syndrome. He also  has a past surgical history that includes Knee cartilage surgery; Carotid angiogram; Scalenotomy w/o resection cervical rib (2012); Right upper extremity thrombectomy (2012); and Colonoscopy with propofol (N/A, 10/10/2019). Justin [name] has a current medication list which includes the following prescription(s): acetaminophen, atorvastatin, gabapentin, ibuprofen, tadalafil, gnp calcium 1200, enoxaparin, and [START ON 06/28/2020] oxycodone. He  reports that he has quit smoking. He smoked 0.50 packs per day. He has never used smokeless tobacco. He reports that he does not drink alcohol and does not use drugs. Justin [name] has No Known Allergies.   HPI  Today, he is being contacted for medication management. The patient indicates doing well with the current medication regimen. No adverse reactions or side effects reported to the medications.   RTCB:  Nonopioids transferred 03/24/2020: Turmeric, Calcium   Pharmacotherapy Assessment  Analgesic: OxycodoneIR5 mg,1 tab PO q 8 hrs(93m/day of oxycodone) MME/day:22.536mday.   Monitoring: [place] PMP: PDMP reviewed during this encounter.       Pharmacotherapy: No side-effects or  adverse reactions reported. Compliance: No problems identified. Effectiveness: Clinically acceptable. Plan: Refer  to "POC".  UDS:  Summary  Date Value Ref Range Status  10/04/2019 Note  Final    Comment:    ==================================================================== [employer] [facility] 13 (MW) ==================================================================== Test                             Result       Flag       Units Drug Present and Declared for Prescription Verification   Oxycodone                      617          EXPECTED   ng/mg creat   Oxymorphone                    312          EXPECTED   ng/mg creat   Noroxycodone                   680          EXPECTED   ng/mg creat   Noroxymorphone                 55           EXPECTED   ng/mg creat    Sources of oxycodone are scheduled prescription medications.    Oxymorphone, noroxycodone, and noroxymorphone are expected    metabolites of oxycodone. Oxymorphone is also available as a    scheduled prescription medication. ==================================================================== Test                      Result    Flag   Units      Ref Range   Creatinine              163              mg/dL      >=20 ==================================================================== Declared Medications:  The flagging and interpretation on this report are based on the  following declared medications.  Unexpected results may arise from  inaccuracies in the declared medications.  **Note: The testing scope of this panel includes these medications:  Oxycodone  **Note: The testing scope of this panel does not include the  following reported medications:  Acetaminophen  Atorvastatin  Calcium  Cyclobenzaprine  Enoxaparin (Lovenox)  Gabapentin  Ibuprofen  Prednisone  Vitamin D ==================================================================== For clinical consultation, please call [phone]. ====================================================================     Laboratory Chemistry Profile   Renal Lab Results  Component Value Date   BUN 13 05/21/2020   CREATININE 1.35 (H) 05/21/2020   BCR 9 01/23/2020   GFRAA >60 02/19/2020   GFRNONAA >60 05/21/2020     Hepatic Lab Results  Component Value Date   AST 17 05/21/2020   ALT 23 05/21/2020   ALBUMIN 3.8 05/21/2020   ALKPHOS 75 05/21/2020   LIPASE 90 03/02/2014     Electrolytes Lab Results  Component Value Date   NA 139 05/21/2020   K 4.2 05/21/2020   CL 102 05/21/2020   CALCIUM 9.2 05/21/2020   MG 2.0 07/28/2017     Bone Lab Results  Component Value Date   25OHVITD1 15 (L) 06/29/2018   25OHVITD2 8.4 06/29/2018   25OHVITD3 6.8 06/29/2018     Inflammation (CRP: Acute Phase) (ESR: Chronic Phase) Lab Results  Component Value Date   CRP 12 (H) 06/29/2018   ESRSEDRATE 27 06/29/2018  LATICACIDVEN 0.9 07/20/2016       Note: Above Lab results reviewed.  Imaging  US Venous Img Upper Uni Right(DVT) CLINICAL DATA:  55 year old male with a history of upper extremity thrombosis, new right arm swelling  EXAM: RIGHT UPPER EXTREMITY VENOUS DOPPLER ULTRASOUND  TECHNIQUE: Gray-scale sonography with graded compression, as well as color Doppler and duplex ultrasound were performed to evaluate the upper extremity deep venous system from the level of the subclavian vein and including the jugular, axillary, basilic, radial, ulnar and upper cephalic vein. Spectral Doppler was utilized to evaluate flow at rest and with distal augmentation maneuvers.  COMPARISON:  None.  FINDINGS: Contralateral Subclavian Vein: Respiratory phasicity is normal and symmetric with the symptomatic side. No evidence of thrombus. Normal compressibility.  Internal Jugular Vein: No evidence of thrombus. Normal compressibility, respiratory phasicity and response to augmentation.  Subclavian Vein: No evidence of thrombus.  Normal compressibility, respiratory phasicity and response to augmentation.  Axillary Vein: No evidence of thrombus. Normal compressibility, respiratory phasicity and response to augmentation.  Cephalic Vein: No evidence of thrombus. Normal compressibility, respiratory phasicity and response to augmentation.  Basilic Vein: No evidence of thrombus. Normal compressibility, respiratory phasicity and response to augmentation.  Brachial Veins: No evidence of thrombus. Normal compressibility, respiratory phasicity and response to augmentation.  Radial Veins: No evidence of thrombus. Normal compressibility, respiratory phasicity and response to augmentation.  Ulnar Veins: No evidence of thrombus. Normal compressibility, respiratory phasicity and response to augmentation.  Other Findings:  None visualized.  IMPRESSION: Sonographic survey of the right upper extremity negative for DVT  Electronically Signed   By: Corrie Mckusick D.O.   On: 01/25/2020 15:10  Assessment  The primary encounter diagnosis was Chronic pain syndrome. Diagnoses of Chronic thoracic back pain (Primary Area of Pain) (Right), Chronic low back pain (Left) with sciatica (Left), Right upper quadrant abdominal pain (Tertiary Area of Pain), Rib pain on right side (Fourth Area of Pain), Thoracic radiculitis (Secondary Area of Pain), Intercostal neuralgia (T6, T7) (Right), Pharmacologic therapy, and Uncomplicated opioid dependence ([place]) were also pertinent to this visit.  Plan of Care  Problem-specific:  No problem-specific Assessment & Plan notes found for this encounter.  Justin [name] has a current medication list which includes the following long-term medication(s): atorvastatin, gabapentin, tadalafil, gnp calcium 1200, enoxaparin, and [START ON 06/28/2020] oxycodone.  Pharmacotherapy (Medications Ordered): Meds ordered this encounter  Medications  . oxyCODONE (OXY IR/ROXICODONE) 5 MG immediate release tablet     Sig: Take 1 tablet (5 mg total) by mouth every 8 (eight) hours as needed for severe pain. Must last 30 days    Dispense:  90 tablet    Refill:  0    Chronic Pain: STOP Act (Not applicable) Fill 1 day early if closed on refill date. Avoid benzodiazepines within 8 hours of opioids   Orders:  No orders of the defined types were placed in this encounter.  Follow-up plan:   Return in about 5 weeks (around 07/28/2020) for (F2F), (Med Mgmt).      Interventional management options:  Considering:   Diagnostic right thoracicSNRB Diagnosticright thoracicfacetblock Possible right thoracic facet medial branch RFA   Palliative PRN treatment(s):   Palliative right T6, T7, T8, &T9intercostal nerve block  Palliative right T6, T7, T8, &T9intercostal nerve RFA #2 (Last done 12/29/17)          Recent Visits No visits were found meeting these conditions. Showing recent visits within past 90 days and meeting all other requirements Today's  Visits Date Type Provider Dept  06/23/20 Telemedicine Milinda Pointer, MD [facility]  Showing today's visits and meeting all other requirements Future Appointments No visits were found meeting these conditions. Showing future appointments within next 90 days and meeting all other requirements  I discussed the assessment and treatment plan with the patient. The patient was provided an opportunity to ask questions and all were answered. The patient agreed with the plan and demonstrated an understanding of the instructions.  Patient advised to call back or seek an in-person evaluation if the symptoms or condition worsens.  Duration of encounter: 12 minutes.  Note by: Gaspar Cola, MD Date: 06/23/2020; Time: 3:12 PM

## 2020-06-23 ENCOUNTER — Ambulatory Visit: Payer: 59 | Attending: Pain Medicine | Admitting: Pain Medicine

## 2020-06-23 ENCOUNTER — Other Ambulatory Visit: Payer: Self-pay

## 2020-06-23 DIAGNOSIS — R1011 Right upper quadrant pain: Secondary | ICD-10-CM

## 2020-06-23 DIAGNOSIS — R0781 Pleurodynia: Secondary | ICD-10-CM

## 2020-06-23 DIAGNOSIS — G894 Chronic pain syndrome: Secondary | ICD-10-CM

## 2020-06-23 DIAGNOSIS — M5442 Lumbago with sciatica, left side: Secondary | ICD-10-CM

## 2020-06-23 DIAGNOSIS — G588 Other specified mononeuropathies: Secondary | ICD-10-CM

## 2020-06-23 DIAGNOSIS — R0789 Other chest pain: Secondary | ICD-10-CM

## 2020-06-23 DIAGNOSIS — G8929 Other chronic pain: Secondary | ICD-10-CM

## 2020-06-23 DIAGNOSIS — Z79899 Other long term (current) drug therapy: Secondary | ICD-10-CM

## 2020-06-23 DIAGNOSIS — F1120 Opioid dependence, uncomplicated: Secondary | ICD-10-CM

## 2020-06-23 DIAGNOSIS — M546 Pain in thoracic spine: Secondary | ICD-10-CM

## 2020-06-23 DIAGNOSIS — M5414 Radiculopathy, thoracic region: Secondary | ICD-10-CM

## 2020-06-23 MED ORDER — OXYCODONE HCL 5 MG PO TABS: 5.0000 mg | ORAL_TABLET | Freq: Three times a day (TID) | ORAL | 0 refills | Status: DC | PRN

## 2020-06-24 ENCOUNTER — Telehealth: Payer: Self-pay

## 2020-06-24 ENCOUNTER — Other Ambulatory Visit: Payer: Self-pay

## 2020-06-24 NOTE — Telephone Encounter (Signed)
Patient called and left message that he missed Dr Cleda Daub call because he did not call at his appointment time.  Please reschedule.

## 2020-07-17 ENCOUNTER — Other Ambulatory Visit: Payer: Self-pay | Admitting: Family Medicine

## 2020-07-27 NOTE — Progress Notes (Signed)
PROVIDER NOTE: Information contained herein reflects review and annotations entered in association with encounter. Interpretation of such information and data should be left to medically-trained personnel. Information provided to patient can be located elsewhere in the medical record under "Patient Instructions". Document created using STT-dictation technology, any transcriptional errors that may result from process are unintentional.    Patient: Justin [name]  Service Category: E/M  Provider: Gaspar Cola, MD  DOB: [birth year]  DOS: 07/28/2020  Specialty: Interventional Pain Management  MRN: [id number]  Setting: Ambulatory outpatient  PCP: Jon Billings, NP  Type: Established Patient    Referring Provider: Volney American,*  Location: Office  Delivery: Face-to-face     HPI  [name], a 55 y.o. year old male, is here today because of his Chronic pain syndrome [G89.4]. Justin [name] primary complain today is rib (right) Last encounter: My last encounter with him was on 03/24/2020. 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 Pain Assessment: Severity of Chronic pain is reported as a 7 /10. Location: Rib cage Right/to the right side of the back. Onset: More than a month ago. Quality: Aching,Throbbing. Timing: Constant. Modifying factor(s): medication. Vitals:  height is 5' 11"  (1.803 m) and weight is 250 lb (113.4 kg). His temporal temperature is 97 F (36.1 C) (abnormal). His blood pressure is 137/79 and his pulse is 72. His respiration is  16 and oxygen saturation is 100%.   Reason for encounter: medication management.   The patient indicates doing well with the current medication regimen. No adverse reactions or side effects reported to the medications.  The patient indicates that intercostal nerve blocks and radiofrequency that we did for him seems to be wearing off. Today he asked me how long it should be for his ribs to heal.  He has had this pain now for approximately 4 years. I would think about now this should have healed.  In any case I reminded him that should he continue to have pain in that area we can always repeat the intercostal nerve blocks and the radiofrequency in order to again provide him with longer lasting benefit. He understood and accepted and he indicated that he would let me know if he needed to have another one done.  RTCB: 10/31/2020 Nonopioids transferred 03/24/2020: Turmeric, Calcium  Pharmacotherapy Assessment   Analgesic: OxycodoneIR5 mg,1 tab PO q 8 hrs(8m/day of oxycodone) MME/day:22.560mday.   Monitoring: [place] PMP: PDMP reviewed during this encounter.       Pharmacotherapy: No side-effects or adverse reactions reported. Compliance: No problems identified. Effectiveness: Clinically acceptable.  WhLandis MartinsRN  07/28/2020  2:13 PM  Sign when Signing Visit Nursing Pain Medication Assessment:  Safety precautions to be maintained throughout the outpatient stay will include: orient to surroundings, keep bed in low position, maintain call bell within reach at all times, provide assistance with transfer out of bed and ambulation.  Medication Inspection Compliance: Pill count conducted under aseptic conditions, in front of the patient. Neither the pills nor the bottle was removed from the patient's sight at any time. Once count was completed pills were immediately returned to the patient  in their original bottle.  Medication: Oxycodone IR Pill/Patch Count: 16 of 90 pills remain Pill/Patch  Appearance: Markings consistent with prescribed medication Bottle Appearance: Standard pharmacy container. Clearly labeled. Filled Date: 06/08/2020 Last Medication intake:  Today    UDS:  Summary  Date Value Ref Range Status  10/04/2019 Note  Final    Comment:    ==================================================================== [employer] [facility] 13 (MW) ==================================================================== Test                             Result       Flag       Units Drug Present and Declared for Prescription Verification   Oxycodone                      617          EXPECTED   ng/mg creat   Oxymorphone                    312          EXPECTED   ng/mg creat   Noroxycodone                   680          EXPECTED   ng/mg creat   Noroxymorphone                 55           EXPECTED   ng/mg creat    Sources of oxycodone are scheduled prescription medications.    Oxymorphone, noroxycodone, and noroxymorphone are expected    metabolites of oxycodone. Oxymorphone is also available as a    scheduled prescription medication. ==================================================================== Test                      Result    Flag   Units      Ref Range   Creatinine              163              mg/dL      >=20 ==================================================================== Declared Medications:  The flagging and interpretation on this report are based on the  following declared medications.  Unexpected results may arise from  inaccuracies in the declared medications.  **Note: The testing scope of this panel includes these medications:  Oxycodone  **Note: The testing scope of this panel does not include the  following reported medications:  Acetaminophen  Atorvastatin  Calcium  Cyclobenzaprine  Enoxaparin (Lovenox)  Gabapentin  Ibuprofen  Prednisone  Vitamin D ==================================================================== For clinical consultation,  please call [phone]. ====================================================================      ROS  Constitutional: Denies any fever or chills Gastrointestinal: No reported hemesis, hematochezia, vomiting, or acute GI distress Musculoskeletal: Denies any acute onset joint swelling, redness, loss of ROM, or weakness Neurological: No reported episodes of acute onset apraxia, aphasia, dysarthria, agnosia, amnesia, paralysis, loss of coordination, or loss of consciousness  Medication Review  GNP Calcium 1200, acetaminophen, atorvastatin, enoxaparin, gabapentin, ibuprofen, oxyCODONE, and tadalafil  History Review  Allergy: Justin [name] has No Known Allergies. Drug: Justin [name]  reports no history of drug use. Alcohol:  reports no history of alcohol use. Tobacco:  reports that he has quit smoking. He smoked 0.50 packs per day. He has never used smokeless tobacco. Social: Justin [name]  reports that he has quit smoking. He smoked  0.50 packs per day. He has never used smokeless tobacco. He reports that he does not drink alcohol and does not use drugs. Medical:  has a past medical history of Arm vein blood clot, Coronary artery disease, DVT (deep venous thrombosis) ([place]) (2012; January 2016), GERD (gastroesophageal reflux disease), Rib pain on right side (08/04/2016), and Vascular thoracic outlet syndrome. Surgical: Justin [name]  has a past surgical history that includes Knee cartilage surgery; Carotid angiogram; Scalenotomy w/o resection cervical rib (2012); Right upper extremity thrombectomy (2012); and Colonoscopy with propofol (N/A, 10/10/2019). Family: family history includes Breast cancer (age of onset: 60) in his father; Cancer in his maternal grandmother; Diabetes in his father; Heart attack in his father; Heart disease in his father; Leukemia in his maternal grandfather; Prostate cancer in his maternal uncle.  Laboratory Chemistry Profile   Renal Lab Results  Component Value Date   BUN  13 05/21/2020   CREATININE 1.35 (H) 05/21/2020   BCR 9 01/23/2020   GFRAA >60 02/19/2020   GFRNONAA >60 05/21/2020     Hepatic Lab Results  Component Value Date   AST 17 05/21/2020   ALT 23 05/21/2020   ALBUMIN 3.8 05/21/2020   ALKPHOS 75 05/21/2020   LIPASE 90 03/02/2014     Electrolytes Lab Results  Component Value Date   NA 139 05/21/2020   K 4.2 05/21/2020   CL 102 05/21/2020   CALCIUM 9.2 05/21/2020   MG 2.0 07/28/2017     Bone Lab Results  Component Value Date   25OHVITD1 15 (L) 06/29/2018   25OHVITD2 8.4 06/29/2018   25OHVITD3 6.8 06/29/2018     Inflammation (CRP: Acute Phase) (ESR: Chronic Phase) Lab Results  Component Value Date   CRP 12 (H) 06/29/2018   ESRSEDRATE 27 06/29/2018   LATICACIDVEN 0.9 07/20/2016       Note: Above Lab results reviewed.  Recent Imaging Review  US Venous Img Upper Uni Right(DVT) CLINICAL DATA:  55 year old male with a history of upper extremity thrombosis, new right arm swelling  EXAM: RIGHT UPPER EXTREMITY VENOUS DOPPLER ULTRASOUND  TECHNIQUE: Gray-scale sonography with graded compression, as well as color Doppler and duplex ultrasound were performed to evaluate the upper extremity deep venous system from the level of the subclavian vein and including the jugular, axillary, basilic, radial, ulnar and upper cephalic vein. Spectral Doppler was utilized to evaluate flow at rest and with distal augmentation maneuvers.  COMPARISON:  None.  FINDINGS: Contralateral Subclavian Vein: Respiratory phasicity is normal and symmetric with the symptomatic side. No evidence of thrombus. Normal compressibility.  Internal Jugular Vein: No evidence of thrombus. Normal compressibility, respiratory phasicity and response to augmentation.  Subclavian Vein: No evidence of thrombus. Normal compressibility, respiratory phasicity and response to augmentation.  Axillary Vein: No evidence of thrombus. Normal  compressibility, respiratory phasicity and response to augmentation.  Cephalic Vein: No evidence of thrombus. Normal compressibility, respiratory phasicity and response to augmentation.  Basilic Vein: No evidence of thrombus. Normal compressibility, respiratory phasicity and response to augmentation.  Brachial Veins: No evidence of thrombus. Normal compressibility, respiratory phasicity and response to augmentation.  Radial Veins: No evidence of thrombus. Normal compressibility, respiratory phasicity and response to augmentation.  Ulnar Veins: No evidence of thrombus. Normal compressibility, respiratory phasicity and response to augmentation.  Other Findings:  None visualized.  IMPRESSION: Sonographic survey of the right upper extremity negative for DVT  Electronically Signed   By: Corrie Mckusick D.O.   On: 01/25/2020 15:10 Note: Reviewed  Physical Exam  General appearance: Well nourished, well developed, and well hydrated. In no apparent acute distress Mental status: Alert, oriented x 3 (person, place, & time)       Respiratory: No evidence of acute respiratory distress Eyes: PERLA Vitals: BP 137/79   Pulse 72   Temp (!) 97 F (36.1 C) (Temporal)   Resp 16   Ht 5' 11"  (1.803 m)   Wt 250 lb (113.4 kg)   SpO2 100%   BMI 34.87 kg/m  BMI: Estimated body mass index is 34.87 kg/m as calculated from the following:   Height as of this encounter: 5' 11"  (1.803 m).   Weight as of this encounter: 250 lb (113.4 kg). Ideal: Ideal body weight: 75.3 kg (166 lb 0.1 oz) Adjusted ideal body weight: 90.5 kg (199 lb 9.7 oz)  Assessment   Status Diagnosis  Controlled Controlled Controlled 1. Chronic pain syndrome   2. Chronic thoracic back pain (Primary Area of Pain) (Right)   3. Chronic low back pain (Left) with sciatica (Left)   4. Right upper quadrant abdominal pain (Tertiary Area of Pain)   5. Rib pain on right side (Fourth Area of Pain)   6. Thoracic radiculitis  (Secondary Area of Pain)   7. Intercostal neuralgia (T6, T7) (Right)   8. Pharmacologic therapy   9. Uncomplicated opioid dependence ([place])      Updated Problems: No problems updated.  Plan of Care  Problem-specific:  No problem-specific Assessment & Plan notes found for this encounter.  Justin [name] has a current medication list which includes the following long-term medication(s): atorvastatin, gabapentin, tadalafil, gnp calcium 1200, enoxaparin, [START ON 08/02/2020] oxycodone, [START ON 09/01/2020] oxycodone, and [START ON 10/01/2020] oxycodone.  Pharmacotherapy (Medications Ordered): Meds ordered this encounter  Medications  . oxyCODONE (OXY IR/ROXICODONE) 5 MG immediate release tablet    Sig: Take 1 tablet (5 mg total) by mouth every 8 (eight) hours as needed for severe pain. Must last 30 days    Dispense:  90 tablet    Refill:  0    Chronic Pain: STOP Act (Not applicable) Fill 1 day early if closed on refill date. Avoid benzodiazepines within 8 hours of opioids  . oxyCODONE (OXY IR/ROXICODONE) 5 MG immediate release tablet    Sig: Take 1 tablet (5 mg total) by mouth every 8 (eight) hours as needed for severe pain. Must last 30 days    Dispense:  90 tablet    Refill:  0    Chronic Pain: STOP Act (Not applicable) Fill 1 day early if closed on refill date. Avoid benzodiazepines within 8 hours of opioids  . oxyCODONE (OXY IR/ROXICODONE) 5 MG immediate release tablet    Sig: Take 1 tablet (5 mg total) by mouth every 8 (eight) hours as needed for severe pain. Must last 30 days    Dispense:  90 tablet    Refill:  0    Chronic Pain: STOP Act (Not applicable) Fill 1 day early if closed on refill date. Avoid benzodiazepines within 8 hours of opioids   Orders:  No orders of the defined types were placed in this encounter.  Follow-up plan:   Return in about 3 months (around 10/31/2020) for (F2F), (Med Mgmt).      Interventional management options:  Considering:   Diagnostic  right thoracicSNRB Diagnosticright thoracicfacetblock Possible right thoracic facet medial branch RFA   Palliative PRN treatment(s):   Palliative right T6, T7, T8, &T9intercostal nerve block  Palliative right T6, T7,  T8, &T9intercostal nerve RFA #2 (Last done 12/29/17)           Recent Visits Date Type Provider Dept  06/23/20 Telemedicine Milinda Pointer, MD [facility]  Showing recent visits within past 90 days and meeting all other requirements Today's Visits Date Type Provider Dept  07/28/20 Office Visit Milinda Pointer, MD [facility]  Showing today's visits and meeting all other requirements Future Appointments No visits were found meeting these conditions. Showing future appointments within next 90 days and meeting all other requirements  I discussed the assessment and treatment plan with the patient. The patient was provided an opportunity to ask questions and all were answered. The patient agreed with the plan and demonstrated an understanding of the instructions.  Patient advised to call back or seek an in-person evaluation if the symptoms or condition worsens.  Duration of encounter: 30 minutes.  Note by: Gaspar Cola, MD Date: 07/28/2020; Time: 2:41 PM

## 2020-07-28 ENCOUNTER — Encounter: Payer: Self-pay | Admitting: Pain Medicine

## 2020-07-28 ENCOUNTER — Other Ambulatory Visit: Payer: Self-pay

## 2020-07-28 ENCOUNTER — Ambulatory Visit: Payer: 59 | Attending: Pain Medicine | Admitting: Pain Medicine

## 2020-07-28 VITALS — BP 137/79 | HR 72 | Temp 97.0°F | Resp 16 | Ht 71.0 in | Wt 250.0 lb

## 2020-07-28 DIAGNOSIS — G8929 Other chronic pain: Secondary | ICD-10-CM

## 2020-07-28 DIAGNOSIS — R1011 Right upper quadrant pain: Secondary | ICD-10-CM | POA: Diagnosis present

## 2020-07-28 DIAGNOSIS — Z79899 Other long term (current) drug therapy: Secondary | ICD-10-CM

## 2020-07-28 DIAGNOSIS — G588 Other specified mononeuropathies: Secondary | ICD-10-CM

## 2020-07-28 DIAGNOSIS — R0781 Pleurodynia: Secondary | ICD-10-CM | POA: Diagnosis present

## 2020-07-28 DIAGNOSIS — M5414 Radiculopathy, thoracic region: Secondary | ICD-10-CM

## 2020-07-28 DIAGNOSIS — M546 Pain in thoracic spine: Secondary | ICD-10-CM | POA: Insufficient documentation

## 2020-07-28 DIAGNOSIS — F1120 Opioid dependence, uncomplicated: Secondary | ICD-10-CM

## 2020-07-28 DIAGNOSIS — G894 Chronic pain syndrome: Secondary | ICD-10-CM | POA: Diagnosis present

## 2020-07-28 DIAGNOSIS — M5442 Lumbago with sciatica, left side: Secondary | ICD-10-CM | POA: Diagnosis present

## 2020-07-28 MED ORDER — OXYCODONE HCL 5 MG PO TABS: 5.0000 mg | ORAL_TABLET | Freq: Three times a day (TID) | ORAL | 0 refills | Status: DC | PRN

## 2020-07-28 NOTE — Patient Instructions (Signed)
____________________________________________________________________________________________  Medication Rules  Purpose: To inform patients, and their family members, of our rules and regulations.  Applies to: All patients receiving prescriptions (written or electronic).  Pharmacy of record: Pharmacy where electronic prescriptions will be sent. If written prescriptions are taken to a different pharmacy, please inform the nursing staff. The pharmacy listed in the electronic medical record should be the one where you would like electronic prescriptions to be sent.  Electronic prescriptions: In compliance with the [place]reng[place] Opioid Misuse Prevention (STOP) Act of 2017 (Session Law 2017-74/H243), effective June 07, 2018, all controlled substances must be electronically prescribed. Calling prescriptions to the pharmacy will cease to exist.  Prescription refills: Only during scheduled appointments. Applies to all prescriptions.  NOTE: The following applies primarily to controlled substances (Opioid* Pain Medications).   Type of encounter (visit): For patients receiving controlled substances, face-to-face visits are required. (Not an option or up to the patient.)  Patient's responsibilities: 1. Pain Pills: Bring all pain pills to every appointment (except for procedure appointments). 2. Pill Bottles: Bring pills in original pharmacy bottle. Always bring the newest bottle. Bring bottle, even if empty. 3. Medication refills: You are responsible for knowing and keeping track of what medications you take and those you need refilled. The day before your appointment: write a list of all prescriptions that need to be refilled. The day of the appointment: give the list to the admitting nurse. Prescriptions will be written only during appointments. No prescriptions will be written on procedure days. If you forget a medication: it will not be "Called in", "Faxed", or "electronically sent".  You will need to get another appointment to get these prescribed. No early refills. Do not call asking to have your prescription filled early. 4. Prescription Accuracy: You are responsible for carefully inspecting your prescriptions before leaving our office. Have the discharge nurse carefully go over each prescription with you, before taking them home. Make sure that your name is accurately spelled, that your address is correct. Check the name and dose of your medication to make sure it is accurate. Check the number of pills, and the written instructions to make sure they are clear and accurate. Make sure that you are given enough medication to last until your next medication refill appointment. 5. Taking Medication: Take medication as prescribed. When it comes to controlled substances, taking less pills or less frequently than prescribed is permitted and encouraged. Never take more pills than instructed. Never take medication more frequently than prescribed.  6. Inform other Doctors: Always inform, all of your healthcare providers, of all the medications you take. 7. Pain Medication from other Providers: You are not allowed to accept any additional pain medication from any other Doctor or Healthcare provider. There are two exceptions to this rule. (see below) In the event that you require additional pain medication, you are responsible for notifying us, as stated below. 8. Cough Medicine: Often these contain an opioid, such as codeine or hydrocodone. Never accept or take cough medicine containing these opioids if you are already taking an opioid* medication. The combination may cause respiratory failure and death. 9. Medication Agreement: You are responsible for carefully reading and following our Medication Agreement. This must be signed before receiving any prescriptions from our practice. Safely store a copy of your signed Agreement. Violations to the Agreement will result in no further prescriptions.  (Additional copies of our Medication Agreement are available upon request.) 10. Laws, Rules, & Regulations: All patients are expected to follow all   Federal and State Laws, Statutes, Rules, & Regulations. Ignorance of the Laws does not constitute a valid excuse.  11. Illegal drugs and Controlled Substances: The use of illegal substances (including, but not limited to marijuana and its derivatives) and/or the illegal use of any controlled substances is strictly prohibited. Violation of this rule may result in the immediate and permanent discontinuation of any and all prescriptions being written by our practice. The use of any illegal substances is prohibited. 12. Adopted CDC guidelines & recommendations: Target dosing levels will be at or below 60 MME/day. Use of benzodiazepines** is not recommended.  Exceptions: There are only two exceptions to the rule of not receiving pain medications from other Healthcare Providers. 1. Exception #1 (Emergencies): In the event of an emergency (i.e.: accident requiring emergency care), you are allowed to receive additional pain medication. However, you are responsible for: As soon as you are able, call our office [phone], at any time of the day or night, and leave a message stating your name, the date and nature of the emergency, and the name and dose of the medication prescribed. In the event that your call is answered by a member of our staff, make sure to document and save the date, time, and the name of the person that took your information.  2. Exception #2 (Planned Surgery): In the event that you are scheduled by another doctor or dentist to have any type of surgery or procedure, you are allowed (for a period no longer than 30 days), to receive additional pain medication, for the acute post-op pain. However, in this case, you are responsible for picking up a copy of our "Post-op Pain Management for Surgeons" handout, and giving it to your surgeon or dentist. This  document is available at our office, and does not require an appointment to obtain it. Simply go to our office during business hours (Monday-Thursday from 8:00 AM to 4:00 PM) (Friday 8:00 AM to 12:00 Noon) or if you have a scheduled appointment with us, prior to your surgery, and ask for it by name. In addition, you are responsible for: calling our office [phone], at any time of the day or night, and leaving a message stating your name, name of your surgeon, type of surgery, and date of procedure or surgery. Failure to comply with your responsibilities may result in termination of therapy involving the controlled substances.  *Opioid medications include: morphine, codeine, oxycodone, oxymorphone, hydrocodone, hydromorphone, meperidine, tramadol, tapentadol, buprenorphine, fentanyl, methadone. **Benzodiazepine medications include: diazepam (Valium), alprazolam (Xanax), clonazepam (Klonopine), lorazepam (Ativan), clorazepate (Tranxene), chlordiazepoxide (Librium), estazolam (Prosom), oxazepam (Serax), temazepam (Restoril), triazolam (Halcion) (Last updated: 05/05/2020) ____________________________________________________________________________________________   ____________________________________________________________________________________________  Medication Recommendations and Reminders  Applies to: All patients receiving prescriptions (written and/or electronic).  Medication Rules & Regulations: These rules and regulations exist for your safety and that of others. They are not flexible and neither are we. Dismissing or ignoring them will be considered "non-compliance" with medication therapy, resulting in complete and irreversible termination of such therapy. (See document titled "Medication Rules" for more details.) In all conscience, because of safety reasons, we cannot continue providing a therapy where the patient does not follow instructions.  Pharmacy of record:   Definition:  This is the pharmacy where your electronic prescriptions will be sent.   We do not endorse any particular pharmacy, however, we have experienced problems with [facility] not securing enough medication supply for the community.  We do not restrict you in your choice of pharmacy. However,   once we write for your prescriptions, we will NOT be re-sending more prescriptions to fix restricted supply problems created by your pharmacy, or your insurance.   The pharmacy listed in the electronic medical record should be the one where you want electronic prescriptions to be sent.  If you choose to change pharmacy, simply notify our nursing staff.  Recommendations:  Keep all of your pain medications in a safe place, under lock and key, even if you live alone. We will NOT replace lost, stolen, or damaged medication.  After you fill your prescription, take 1 week's worth of pills and put them away in a safe place. You should keep a separate, properly labeled bottle for this purpose. The remainder should be kept in the original bottle. Use this as your primary supply, until it runs out. Once it's gone, then you know that you have 1 week's worth of medicine, and it is time to come in for a prescription refill. If you do this correctly, it is unlikely that you will ever run out of medicine.  To make sure that the above recommendation works, it is very important that you make sure your medication refill appointments are scheduled at least 1 week before you run out of medicine. To do this in an effective manner, make sure that you do not leave the office without scheduling your next medication management appointment. Always ask the nursing staff to show you in your prescription , when your medication will be running out. Then arrange for the receptionist to get you a return appointment, at least 7 days before you run out of medicine. Do not wait until you have 1 or 2 pills left, to come in. This is very poor planning and  does not take into consideration that we may need to cancel appointments due to bad weather, sickness, or emergencies affecting our staff.  DO NOT ACCEPT A "Partial Fill": If for any reason your pharmacy does not have enough pills/tablets to completely fill or refill your prescription, do not allow for a "partial fill". The law allows the pharmacy to complete that prescription within 72 hours, without requiring a new prescription. If they do not fill the rest of your prescription within those 72 hours, you will need a separate prescription to fill the remaining amount, which we will NOT provide. If the reason for the partial fill is your insurance, you will need to talk to the pharmacist about payment alternatives for the remaining tablets, but again, DO NOT ACCEPT A PARTIAL FILL, unless you can trust your pharmacist to obtain the remainder of the pills within 72 hours.  Prescription refills and/or changes in medication(s):   Prescription refills, and/or changes in dose or medication, will be conducted only during scheduled medication management appointments. (Applies to both, written and electronic prescriptions.)  No refills on procedure days. No medication will be changed or started on procedure days. No changes, adjustments, and/or refills will be conducted on a procedure day. Doing so will interfere with the diagnostic portion of the procedure.  No phone refills. No medications will be "called into the pharmacy".  No Fax refills.  No weekend refills.  No Holliday refills.  No after hours refills.  Remember:  Business hours are:  Monday to Thursday 8:00 AM to 4:00 PM Provider's Schedule: Youlanda Tomassetti, MD - Appointments are:  Medication management: Monday and Wednesday 8:00 AM to 4:00 PM Procedure day: Tuesday and Thursday 7:30 AM to 4:00 PM Bilal Lateef, MD - Appointments are:    Medication management: Tuesday and Thursday 8:00 AM to 4:00 PM Procedure day: Monday and Wednesday  7:30 AM to 4:00 PM (Last update: 12/26/2019) ____________________________________________________________________________________________    

## 2020-07-28 NOTE — Progress Notes (Signed)
Nursing Pain Medication Assessment:  Safety precautions to be maintained throughout the outpatient stay will include: orient to surroundings, keep bed in low position, maintain call bell within reach at all times, provide assistance with transfer out of bed and ambulation.  Medication Inspection Compliance: Pill count conducted under aseptic conditions, in front of the patient. Neither the pills nor the bottle was removed from the patient's sight at any time. Once count was completed pills were immediately returned to the patient in their original bottle.  Medication: Oxycodone IR Pill/Patch Count: 16 of 90 pills remain Pill/Patch Appearance: Markings consistent with prescribed medication Bottle Appearance: Standard pharmacy container. Clearly labeled. Filled Date: 06/08/2020 Last Medication intake:  Today

## 2020-07-29 NOTE — Assessment & Plan Note (Signed)
Stable, continue supplementation.  Labs drawn today.

## 2020-07-29 NOTE — Assessment & Plan Note (Signed)
Ongoing.  Continues to be followed by [facility].  Last appointment 07/28/20.

## 2020-07-29 NOTE — Assessment & Plan Note (Addendum)
Ongoing.  ASCVD risk score is 6.2%.  Increased Atorvastatin to 80mg .  Labs drawn today.  Will continue to monitor.

## 2020-07-29 NOTE — Assessment & Plan Note (Signed)
Followed by Hematology, on lovenox injections. Continue per their recommendations

## 2020-07-29 NOTE — Assessment & Plan Note (Signed)
Stable.  Labs drawn today.  Continue with low carb diet.

## 2020-07-29 NOTE — Progress Notes (Signed)
BP 119/74   Pulse (!) 59   Temp 98.1 F (36.7 C)   Wt 252 lb (114.3 kg)   SpO2 97%   BMI 35.15 kg/m    Subjective:    Patient ID: Justin [name], male    DOB: [birth year], 55 y.o.   MRN: [id number]  HPI: Justin [name] is a 55 y.o. male  No chief complaint on file.  HYPERLIPIDEMIA Hyperlipidemia status: good compliance Satisfied with current treatment?  yes Side effects:  no Medication compliance: excellent compliance Past cholesterol meds: atorvastain (lipitor) Supplements: none Aspirin:  no The 10-year ASCVD risk score [name], et al., 2013) is: 6.2%   Values used to calculate the score:     Age: 50 years     Sex: Male     Is Non-Hispanic African American: Yes     Diabetic: No     Tobacco smoker: No     Systolic Blood Pressure: 106 mmHg     Is BP treated: No     HDL Cholesterol: 49 mg/dL     Total Cholesterol: 241 mg/dL Chest pain:  no Coronary artery disease:  no Family history CAD:  no Family history early CAD:  no  IMPAIRED FASTING GLUCOSE No concerns at visit today.     CHRONIC PAIN SYNDROME Followed by [facility] pain clinic.  Patient states he has been having right sided neck pain that radiates down his arm.  Started with a crook in his neck.  This has been going on for about 1 month.  Patient has used heat which helps at the time.   Relevant past medical, surgical, family and social history reviewed and updated as indicated. Interim medical history since our last visit reviewed. Allergies and medications reviewed and updated.  Review of Systems  Eyes: Negative for visual disturbance.  Respiratory: Negative for shortness of breath.   Cardiovascular: Negative for chest pain and leg swelling.  Neurological: Negative for light-headedness and headaches.    Per HPI unless specifically indicated above     Objective:    BP 119/74   Pulse (!) 59   Temp 98.1 F (36.7 C)   Wt 252 lb (114.3 kg)   SpO2 97%   BMI 35.15 kg/m   Wt Readings from  Last 3 Encounters:  07/30/20 252 lb (114.3 kg)  07/28/20 250 lb (113.4 kg)  06/02/20 250 lb (113.4 kg)    Physical Exam  Results for orders placed or performed in visit on 06/02/20  Microscopic Examination   Urine  Result Value Ref Range   WBC, UA 0-5 0 - 5 /hpf   RBC 0-2 0 - 2 /hpf   Epithelial Cells (non renal) 0-10 0 - 10 /hpf   Bacteria, UA None seen None seen/Few  Urinalysis, Complete  Result Value Ref Range   Specific Gravity, UA 1.020 1.005 - 1.030   pH, UA 5.5 5.0 - 7.5   Color, UA Yellow Yellow   Appearance Ur Clear Clear   Leukocytes,UA Negative Negative   Protein,UA Negative Negative/Trace   Glucose, UA Negative Negative   Ketones, UA Negative Negative   RBC, UA Trace (A) Negative   Bilirubin, UA Negative Negative   Urobilinogen, Ur 0.2 0.2 - 1.0 mg/dL   Nitrite, UA Negative Negative   Microscopic Examination See below:       Assessment & Plan:   Problem List Items Addressed This Visit      Endocrine   IFG (impaired fasting glucose)  Stable.  Labs drawn today.  Continue with low carb diet.       Relevant Orders   Comprehensive metabolic panel   HgB K5G     Other   Chronic pain syndrome (Chronic)    Ongoing.  Continues to be followed by [facility].  Last appointment 07/28/20.      Relevant Medications   cyclobenzaprine (FEXMID) 7.5 MG tablet   Vitamin D deficiency    Stable, continue supplementation.  Labs drawn today.       Relevant Orders   Vitamin D (25 hydroxy)   Mixed hyperlipidemia - Primary    Ongoing.  ASCVD risk score is 6.2%.  Increased Atorvastatin to 80mg .  Labs drawn today.  Will continue to monitor.       Relevant Medications   atorvastatin (LIPITOR) 80 MG tablet   Other Relevant Orders   Lipid Profile    Other Visit Diagnoses    Muscle spasm       Recommend patient continue with heat and use Flexeril as needed for muscle spasm.   Relevant Medications   cyclobenzaprine (FEXMID) 7.5 MG tablet       Follow up  plan: Return in about 6 months (around 01/27/2021) for Physical and Fasting labs.

## 2020-07-30 ENCOUNTER — Other Ambulatory Visit: Payer: Self-pay

## 2020-07-30 ENCOUNTER — Ambulatory Visit (INDEPENDENT_AMBULATORY_CARE_PROVIDER_SITE_OTHER): Payer: 59 | Admitting: Nurse Practitioner

## 2020-07-30 ENCOUNTER — Encounter: Payer: Self-pay | Admitting: Nurse Practitioner

## 2020-07-30 VITALS — BP 119/74 | HR 59 | Temp 98.1°F | Wt 252.0 lb

## 2020-07-30 DIAGNOSIS — E782 Mixed hyperlipidemia: Secondary | ICD-10-CM

## 2020-07-30 DIAGNOSIS — R7301 Impaired fasting glucose: Secondary | ICD-10-CM

## 2020-07-30 DIAGNOSIS — G894 Chronic pain syndrome: Secondary | ICD-10-CM

## 2020-07-30 DIAGNOSIS — M62838 Other muscle spasm: Secondary | ICD-10-CM

## 2020-07-30 DIAGNOSIS — E559 Vitamin D deficiency, unspecified: Secondary | ICD-10-CM

## 2020-07-30 MED ORDER — ATORVASTATIN CALCIUM 80 MG PO TABS: 80.0000 mg | ORAL_TABLET | Freq: Every day | ORAL | 1 refills | Status: DC

## 2020-07-30 MED ORDER — CYCLOBENZAPRINE HCL 7.5 MG PO TABS
7.5000 mg | ORAL_TABLET | Freq: Every evening | ORAL | 0 refills | Status: DC | PRN
Start: 2020-07-30 — End: 2020-11-24

## 2020-07-31 LAB — HEMOGLOBIN A1C
Est. average glucose Bld gHb Est-mCnc: 114 mg/dL
Hgb A1c MFr Bld: 5.6 % (ref 4.8–5.6)

## 2020-07-31 LAB — LIPID PANEL
Chol/HDL Ratio: 3.9 ratio (ref 0.0–5.0)
Cholesterol, Total: 193 mg/dL (ref 100–199)
HDL: 50 mg/dL (ref >39)
LDL Chol Calc (NIH): 108 mg/dL — ABNORMAL HIGH (ref 0–99)
Triglycerides: 201 mg/dL — ABNORMAL HIGH (ref 0–149)
VLDL Cholesterol Cal: 35 mg/dL (ref 5–40)

## 2020-07-31 LAB — VITAMIN D 25 HYDROXY (VIT D DEFICIENCY, FRACTURES): Vit D, 25-Hydroxy: 28.7 ng/mL — ABNORMAL LOW (ref 30.0–100.0)

## 2020-07-31 NOTE — Progress Notes (Signed)
Hi Justin [name].  It was a pleasure meeting your yesterday.  Your lab work shows that your cholesterol has improved from before.  However, it is still a little elevated.  I recommend a low fat diet.  I think we will continue to see improvement with the increased dose of atorvastatin.  Your vitamin D is low. I recommend you take 1000 IU of vitamin d over the counter once a day to help with this.  Your A1c remains well controlled at 5.6.  I will see you at our next visit.

## 2020-08-19 ENCOUNTER — Other Ambulatory Visit: Payer: Self-pay

## 2020-08-19 ENCOUNTER — Inpatient Hospital Stay: Payer: 59 | Attending: Hematology and Oncology

## 2020-08-19 DIAGNOSIS — Z86718 Personal history of other venous thrombosis and embolism: Secondary | ICD-10-CM | POA: Diagnosis present

## 2020-08-19 DIAGNOSIS — I82B12 Acute embolism and thrombosis of left subclavian vein: Secondary | ICD-10-CM

## 2020-08-19 DIAGNOSIS — Z7901 Long term (current) use of anticoagulants: Secondary | ICD-10-CM

## 2020-08-19 DIAGNOSIS — N529 Male erectile dysfunction, unspecified: Secondary | ICD-10-CM

## 2020-08-19 LAB — CBC WITH DIFFERENTIAL/PLATELET
Abs Immature Granulocytes: 0.03 10*3/uL (ref 0.00–0.07)
Basophils Absolute: 0 10*3/uL (ref 0.0–0.1)
Basophils Relative: 0 %
Eosinophils Absolute: 0.2 10*3/uL (ref 0.0–0.5)
Eosinophils Relative: 2 %
HCT: 46.1 % (ref 39.0–52.0)
Hemoglobin: 14.7 g/dL (ref 13.0–17.0)
Immature Granulocytes: 0 %
Lymphocytes Relative: 30 %
Lymphs Abs: 2.2 10*3/uL (ref 0.7–4.0)
MCH: 29.8 pg (ref 26.0–34.0)
MCHC: 31.9 g/dL (ref 30.0–36.0)
MCV: 93.5 fL (ref 80.0–100.0)
Monocytes Absolute: 0.8 10*3/uL (ref 0.1–1.0)
Monocytes Relative: 10 %
Neutro Abs: 4.1 10*3/uL (ref 1.7–7.7)
Neutrophils Relative %: 58 %
Platelets: 260 10*3/uL (ref 150–400)
RBC: 4.93 MIL/uL (ref 4.22–5.81)
RDW: 12.5 % (ref 11.5–15.5)
WBC: 7.3 10*3/uL (ref 4.0–10.5)
nRBC: 0 % (ref 0.0–0.2)

## 2020-08-19 LAB — COMPREHENSIVE METABOLIC PANEL
ALT: 35 U/L (ref 0–44)
AST: 27 U/L (ref 15–41)
Albumin: 3.8 g/dL (ref 3.5–5.0)
Alkaline Phosphatase: 81 U/L (ref 38–126)
Anion gap: 11 (ref 5–15)
BUN: 15 mg/dL (ref 6–20)
CO2: 25 mmol/L (ref 22–32)
Calcium: 9.6 mg/dL (ref 8.9–10.3)
Chloride: 104 mmol/L (ref 98–111)
Creatinine, Ser: 1.37 mg/dL — ABNORMAL HIGH (ref 0.61–1.24)
GFR, Estimated: >60 mL/min (ref >60)
Glucose, Bld: 110 mg/dL — ABNORMAL HIGH (ref 70–99)
Potassium: 4.4 mmol/L (ref 3.5–5.1)
Sodium: 140 mmol/L (ref 135–145)
Total Bilirubin: 0.5 mg/dL (ref 0.3–1.2)
Total Protein: 7.6 g/dL (ref 6.5–8.1)

## 2020-09-11 ENCOUNTER — Telehealth (INDEPENDENT_AMBULATORY_CARE_PROVIDER_SITE_OTHER): Payer: 59 | Admitting: Nurse Practitioner

## 2020-09-11 ENCOUNTER — Other Ambulatory Visit: Payer: Self-pay

## 2020-09-11 ENCOUNTER — Encounter: Payer: Self-pay | Admitting: Nurse Practitioner

## 2020-09-11 DIAGNOSIS — J40 Bronchitis, not specified as acute or chronic: Secondary | ICD-10-CM | POA: Diagnosis not present

## 2020-09-11 MED ORDER — AZITHROMYCIN 250 MG PO TABS: ORAL_TABLET | ORAL | 0 refills | Status: DC

## 2020-09-11 MED ORDER — BENZONATATE 100 MG PO CAPS: 100.0000 mg | ORAL_CAPSULE | Freq: Two times a day (BID) | ORAL | 0 refills | Status: DC | PRN

## 2020-09-11 MED ORDER — ALBUTEROL SULFATE HFA 108 (90 BASE) MCG/ACT IN AERS: 2.0000 | INHALATION_SPRAY | Freq: Four times a day (QID) | RESPIRATORY_TRACT | 0 refills | Status: DC | PRN

## 2020-09-11 NOTE — Assessment & Plan Note (Signed)
Complete antibiotics. Sent albuterol for wheezing and benzonatate for cough. Patient takes oxycodone daily so unable to provide cough medicine with codiene.  Er precautions discussed. Return to clinic if symptoms do not improve.

## 2020-09-11 NOTE — Progress Notes (Addendum)
There were no vitals taken for this visit.   Subjective:    Patient ID: Justin [name], male    DOB: April 05, 1966, 55 y.o.   MRN: [id number]  HPI: Justin [name] is a 55 y.o. male  Chief Complaint  Patient presents with  . URI    X 5 days, nasal/head congestion, wheezing and coughing     UPPER RESPIRATORY TRACT INFECTION Worst symptom: cough, congestion, wheezing x 5 days Patient's wife was seen by a provider earlier this week and treated with antibiotics for bronchitis. Fever: no Cough: yes Shortness of breath: no Wheezing: yes Chest pain: no Chest tightness: no Chest congestion: yes Nasal congestion: yes Runny nose: no Post nasal drip: yes Sneezing: yes Sore throat: yes Swollen glands: no Sinus pressure: yes Headache: no Face pain: no Toothache: no Ear pain: no bilateral Ear pressure: no bilateral Eyes red/itching:no Eye drainage/crusting: no  Vomiting: no Rash: no Fatigue: yes Sick contacts: yes Strep contacts: no  Context: stable Recurrent sinusitis: no Relief with OTC cold/cough medications: no  Treatments attempted: Nyquil and robitussin   Relevant past medical, surgical, family and social history reviewed and updated as indicated. Interim medical history since our last visit reviewed. Allergies and medications reviewed and updated.  Review of Systems  Constitutional: Positive for fatigue. Negative for fever.  HENT: Positive for congestion, sneezing and sore throat. Negative for ear pain, postnasal drip, rhinorrhea, sinus pressure and sinus pain.   Respiratory: Positive for cough and wheezing. Negative for chest tightness and shortness of breath.   Gastrointestinal: Negative for vomiting.  Skin: Negative for rash.  Neurological: Negative for headaches.    Per HPI unless specifically indicated above     Objective:    There were no vitals taken for this visit.  Wt Readings from Last 3 Encounters:  07/30/20 252 lb (114.3 kg)  07/28/20 250  lb (113.4 kg)  06/02/20 250 lb (113.4 kg)    Physical Exam Vitals and nursing note reviewed.  Constitutional:      General: He is not in acute distress.    Appearance: He is not ill-appearing.  HENT:     Head: Normocephalic.     Right Ear: Hearing normal.     Left Ear: Hearing normal.     Nose: Nose normal.  Pulmonary:     Effort: Pulmonary effort is normal. No respiratory distress.  Neurological:     Mental Status: He is alert.  Psychiatric:        Mood and Affect: Mood normal.        Behavior: Behavior normal.        Thought Content: Thought content normal.        Judgment: Judgment normal.     Results for orders placed or performed in visit on 08/19/20  Comprehensive metabolic panel  Result Value Ref Range   Sodium 140 135 - 145 mmol/L   Potassium 4.4 3.5 - 5.1 mmol/L   Chloride 104 98 - 111 mmol/L   CO2 25 22 - 32 mmol/L   Glucose, Bld 110 (H) 70 - 99 mg/dL   BUN 15 6 - 20 mg/dL   Creatinine, Ser 1.37 (H) 0.61 - 1.24 mg/dL   Calcium 9.6 8.9 - 10.3 mg/dL   Total Protein 7.6 6.5 - 8.1 g/dL   Albumin 3.8 3.5 - 5.0 g/dL   AST 27 15 - 41 U/L   ALT 35 0 - 44 U/L   Alkaline Phosphatase 81 38 - 126 U/L  Total Bilirubin 0.5 0.3 - 1.2 mg/dL   GFR, Estimated >60 >60 mL/min   Anion gap 11 5 - 15  CBC with Differential  Result Value Ref Range   WBC 7.3 4.0 - 10.5 K/uL   RBC 4.93 4.22 - 5.81 MIL/uL   Hemoglobin 14.7 13.0 - 17.0 g/dL   HCT 46.1 39.0 - 52.0 %   MCV 93.5 80.0 - 100.0 fL   MCH 29.8 26.0 - 34.0 pg   MCHC 31.9 30.0 - 36.0 g/dL   RDW 12.5 11.5 - 15.5 %   Platelets 260 150 - 400 K/uL   nRBC 0.0 0.0 - 0.2 %   Neutrophils Relative % 58 %   Neutro Abs 4.1 1.7 - 7.7 K/uL   Lymphocytes Relative 30 %   Lymphs Abs 2.2 0.7 - 4.0 K/uL   Monocytes Relative 10 %   Monocytes Absolute 0.8 0.1 - 1.0 K/uL   Eosinophils Relative 2 %   Eosinophils Absolute 0.2 0.0 - 0.5 K/uL   Basophils Relative 0 %   Basophils Absolute 0.0 0.0 - 0.1 K/uL   Immature Granulocytes 0 %    Abs Immature Granulocytes 0.03 0.00 - 0.07 K/uL      Assessment & Plan:   Problem List Items Addressed This Visit      Respiratory   Bronchitis - Primary    Complete antibiotics. Sent albuterol for wheezing and benzonatate for cough. Patient takes oxycodone daily so unable to provide cough medicine with codiene.  Er precautions discussed. Return to clinic if symptoms do not improve.       Relevant Medications   azithromycin (ZITHROMAX) 250 MG tablet       Follow up plan: Return if symptoms worsen or fail to improve.     This visit was completed via MyChart due to the restrictions of the COVID-19 pandemic. All issues as above were discussed and addressed. Physical exam was done as above through visual confirmation on MyChart. If it was felt that the patient should be evaluated in the office, they were directed there. The patient verbally consented to this visit. 1. Location of the patient: Home 2. Location of the provider: Office 3. Those involved with this call:  ? Provider: Jon Billings, NP ? CMA: Tiffany Reel, CMA ? Front Desk/Registration: Jill Side 4. Time spent on call: 15 minutes with patient face to face via video conference. More than 50% of this time was spent in counseling and coordination of care. 20 minutes total spent in review of patient's record and preparation of their chart.

## 2020-10-12 ENCOUNTER — Other Ambulatory Visit: Payer: Self-pay | Admitting: Nurse Practitioner

## 2020-10-12 NOTE — Telephone Encounter (Signed)
Requested Prescriptions  Pending Prescriptions Disp Refills  . albuterol (VENTOLIN HFA) 108 (90 Base) MCG/ACT inhaler [Pharmacy Med Name: ALBUTEROL HFA (PROAIR) INHALER] 8.5 each 0    Sig: TAKE 2 PUFFS BY MOUTH EVERY 6 HOURS AS NEEDED FOR WHEEZE OR SHORTNESS OF BREATH     Pulmonology:  Beta Agonists Failed - 10/12/2020  9:13 AM      Failed - One inhaler should last at least one month. If the patient is requesting refills earlier, contact the patient to check for uncontrolled symptoms.      Passed - Valid encounter within last 12 months    Recent Outpatient Visits          1 month ago [place], NP   2 months ago Mixed hyperlipidemia   [place], NP   8 months ago IFG (impaired fasting glucose)   [facility] Merrie [place], [place]   10 months ago [place], [name] [place], [place]   1 year ago Acute left-sided low back pain with left-sided sciatica   [facility] Venita [place], NP      Future Appointments            In 3 months Jon Billings, NP [facility], [place]

## 2020-10-29 ENCOUNTER — Encounter: Payer: Self-pay | Admitting: Student in an Organized Health Care Education/Training Program

## 2020-10-29 ENCOUNTER — Ambulatory Visit
Payer: 59 | Attending: Student in an Organized Health Care Education/Training Program | Admitting: Student in an Organized Health Care Education/Training Program

## 2020-10-29 ENCOUNTER — Encounter: Payer: 59 | Admitting: Pain Medicine

## 2020-10-29 VITALS — BP 144/79 | HR 78 | Temp 96.7°F | Resp 16 | Ht 71.5 in | Wt 252.0 lb

## 2020-10-29 DIAGNOSIS — Z79899 Other long term (current) drug therapy: Secondary | ICD-10-CM | POA: Diagnosis present

## 2020-10-29 DIAGNOSIS — R0781 Pleurodynia: Secondary | ICD-10-CM | POA: Diagnosis present

## 2020-10-29 DIAGNOSIS — R1011 Right upper quadrant pain: Secondary | ICD-10-CM | POA: Diagnosis not present

## 2020-10-29 DIAGNOSIS — G894 Chronic pain syndrome: Secondary | ICD-10-CM | POA: Insufficient documentation

## 2020-10-29 DIAGNOSIS — G588 Other specified mononeuropathies: Secondary | ICD-10-CM | POA: Diagnosis present

## 2020-10-29 DIAGNOSIS — M5442 Lumbago with sciatica, left side: Secondary | ICD-10-CM | POA: Insufficient documentation

## 2020-10-29 DIAGNOSIS — G8929 Other chronic pain: Secondary | ICD-10-CM | POA: Insufficient documentation

## 2020-10-29 DIAGNOSIS — M546 Pain in thoracic spine: Secondary | ICD-10-CM | POA: Insufficient documentation

## 2020-10-29 MED ORDER — OXYCODONE HCL 5 MG PO TABS: 5.0000 mg | ORAL_TABLET | Freq: Three times a day (TID) | ORAL | 0 refills | Status: DC | PRN

## 2020-10-29 NOTE — Progress Notes (Signed)
PROVIDER NOTE: Information contained herein reflects review and annotations entered in association with encounter. Interpretation of such information and data should be left to medically-trained personnel. Information provided to patient can be located elsewhere in the medical record under "Patient Instructions". Document created using STT-dictation technology, any transcriptional errors that may result from process are unintentional.    Patient: Justin [name]  Service Category: E/M  Provider: Gillis Santa, MD  DOB: [birth year]  DOS: 10/29/2020  Specialty: Interventional Pain Management  MRN: [id number]  Setting: Ambulatory outpatient  PCP: Jon Billings, NP  Type: Established Patient    Referring Provider: Jon Billings, NP  Location: Office  Delivery: Face-to-face     HPI  [name], a 55 y.o. year old male, is here today because of his Chronic right-sided thoracic back pain [M54.6, G89.29]. [name] primary complain today is Chest Pain  Patient Active Problem List   Diagnosis Date Noted  . Bronchitis 09/11/2020  . Uncomplicated opioid dependence ([place]) 06/23/2020  . Erectile dysfunction 06/15/2020  . IFG (impaired fasting glucose) 08/09/2019  . Mixed hyperlipidemia 01/08/2019  . Chronic low back pain (Left) with sciatica (Left) 09/01/2017  . Vitamin D deficiency 08/22/2017  . 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 Intercostal neuralgia (T6, T7) (Right) 08/22/2017  . Elevated C-reactive protein (CRP) 08/01/2017  . Elevated sed rate 08/01/2017  . Chronic thoracic back pain (Primary Area of Pain) (Right) 07/28/2017  . Thoracic radiculitis (Secondary Area of Pain) 07/28/2017  . Chronic pain syndrome 07/28/2017  . Long term current use of opiate analgesic 07/28/2017  . Long term current use of anticoagulant (Lovenox) 07/28/2017  . Pharmacologic therapy 07/28/2017  . Disorder of skeletal system 07/28/2017  . Rib pain on right  side (Fourth Area of Pain) 07/28/2017  . Right upper quadrant abdominal pain (Tertiary Area of Pain) 07/28/2017  . TMJ tenderness, right 12/14/2016  . Lupus anticoagulant positive 07/08/2016  . DVT (deep venous thrombosis) ([place]) 07/08/2016    Pain Assessment: Severity of Chronic pain is reported as a 6 /10. Location:  (right rib) Right/radiate around to middle of back. Onset: More than a month ago. Quality: Aching,Constant,Sharp,Discomfort,Pins and needles. Timing: Constant. Modifying factor(s): Medications, heat sometimes. Vitals:  height is 5' 11.5" (1.816 m) and weight is 252 lb (114.3 kg). His temperature is 96.7 F (35.9 C) (abnormal). His blood pressure is 144/79 (abnormal) and his pulse is 78. His respiration is 16 and oxygen saturation is 100%.   Reason for encounter: medication management.    Patient is going to be out of his medications tomorrow and had an appointment with Dr. Dossie Arbour for tomorrow however Dr. Dossie Arbour is out of clinic.  He showed up today for his medication refill.  I will cover his medications for 1 month until he is able to see Dr. Dossie Arbour again for medication refill. Otherwise, no change in medical history since last visit.  Patient's pain is at baseline.  Patient continues multimodal pain regimen as prescribed.  States that it provides pain relief and improvement in functional status.  Pharmacotherapy Assessment   Analgesic: Oxycodone 5 mg 3 times daily as needed, quantity 90/month; MME equals 22.5    Monitoring: [place] PMP: PDMP reviewed during this encounter.       Pharmacotherapy: No side-effects or adverse reactions reported. Compliance: No problems identified. Effectiveness: Clinically acceptable.  Justin Specking, RN  10/29/2020  2:28 PM  Sign when Signing Visit Nursing Pain Medication Assessment:  Safety precautions to be  maintained throughout the outpatient stay will include: orient to surroundings, keep bed in low position, maintain call bell within  reach at all times, provide assistance with transfer out of bed and ambulation.  Medication Inspection Compliance: Pill count conducted under aseptic conditions, in front of the patient. Neither the pills nor the bottle was removed from the patient's sight at any time. Once count was completed pills were immediately returned to the patient in their original bottle.  Medication: See above Pill/Patch Count: 5 of 90 pills remain Pill/Patch Appearance: Markings consistent with prescribed medication Bottle Appearance: Standard pharmacy container. Clearly labeled. Filled Date: 04 / 26 / 2022 Last Medication intake:  Today    UDS:  Summary  Date Value Ref Range Status  10/04/2019 Note  Final    Comment:    ==================================================================== [employer] Select 13 (MW) ==================================================================== Test                             Result       Flag       Units Drug Present and Declared for Prescription Verification   Oxycodone                      617          EXPECTED   ng/mg creat   Oxymorphone                    312          EXPECTED   ng/mg creat   Noroxycodone                   680          EXPECTED   ng/mg creat   Noroxymorphone                 55           EXPECTED   ng/mg creat    Sources of oxycodone are scheduled prescription medications.    Oxymorphone, noroxycodone, and noroxymorphone are expected    metabolites of oxycodone. Oxymorphone is also available as a    scheduled prescription medication. ==================================================================== Test                      Result    Flag   Units      Ref Range   Creatinine              163              mg/dL      >=20 ==================================================================== Declared Medications:  The flagging and interpretation on this report are based on the  following declared medications.  Unexpected results may arise from   inaccuracies in the declared medications.  **Note: The testing scope of this panel includes these medications:  Oxycodone  **Note: The testing scope of this panel does not include the  following reported medications:  Acetaminophen  Atorvastatin  Calcium  Cyclobenzaprine  Enoxaparin (Lovenox)  Gabapentin  Ibuprofen  Prednisone  Vitamin D ==================================================================== For clinical consultation, please call [phone]. ====================================================================      ROS  Constitutional: Denies any fever or chills Gastrointestinal: No reported hemesis, hematochezia, vomiting, or acute GI distress Musculoskeletal: Mid back, intercostal pain Neurological: No reported episodes of acute onset apraxia, aphasia, dysarthria, agnosia, amnesia, paralysis, loss of coordination, or loss of consciousness  Medication Review  GNP Calcium 1200,  acetaminophen, albuterol, atorvastatin, azithromycin, benzonatate, cyclobenzaprine, enoxaparin, gabapentin, ibuprofen, and oxyCODONE  History Review  Allergy: Justin [name] has No Known Allergies. Drug: Justin [name]  reports no history of drug use. Alcohol:  reports no history of alcohol use. Tobacco:  reports that he has quit smoking. He smoked 0.50 packs per day. He has never used smokeless tobacco. Social: Justin [name]  reports that he has quit smoking. He smoked 0.50 packs per day. He has never used smokeless tobacco. He reports that he does not drink alcohol and does not use drugs. Medical:  has a past medical history of Arm vein blood clot, Coronary artery disease, DVT (deep venous thrombosis) ([place]) (2012; January 2016), GERD (gastroesophageal reflux disease), Rib pain on right side (08/04/2016), and Vascular thoracic outlet syndrome. Surgical: Justin [name]  has a past surgical history that includes Knee cartilage surgery; Carotid angiogram; Scalenotomy w/o resection cervical rib (2012);  Right upper extremity thrombectomy (2012); and Colonoscopy with propofol (N/A, 10/10/2019). Family: family history includes Breast cancer (age of onset: 51) in his father; Cancer in his maternal grandmother; Diabetes in his father; Heart attack in his father; Heart disease in his father; Leukemia in his maternal grandfather; Prostate cancer in his maternal uncle.  Laboratory Chemistry Profile   Renal Lab Results  Component Value Date   BUN 15 08/19/2020   CREATININE 1.37 (H) 08/19/2020   BCR 9 01/23/2020   GFRAA >60 02/19/2020   GFRNONAA >60 08/19/2020     Hepatic Lab Results  Component Value Date   AST 27 08/19/2020   ALT 35 08/19/2020   ALBUMIN 3.8 08/19/2020   ALKPHOS 81 08/19/2020   LIPASE 90 03/02/2014     Electrolytes Lab Results  Component Value Date   NA 140 08/19/2020   K 4.4 08/19/2020   CL 104 08/19/2020   CALCIUM 9.6 08/19/2020   MG 2.0 07/28/2017     Bone Lab Results  Component Value Date   VD25OH 28.7 (L) 07/30/2020   25OHVITD1 15 (L) 06/29/2018   25OHVITD2 8.4 06/29/2018   25OHVITD3 6.8 06/29/2018     Inflammation (CRP: Acute Phase) (ESR: Chronic Phase) Lab Results  Component Value Date   CRP 12 (H) 06/29/2018   ESRSEDRATE 27 06/29/2018   LATICACIDVEN 0.9 07/20/2016       Note: Above Lab results reviewed.  Recent Imaging Review  US Venous Img Upper Uni Right(DVT) CLINICAL DATA:  55 year old male with a history of upper extremity thrombosis, new right arm swelling  EXAM: RIGHT UPPER EXTREMITY VENOUS DOPPLER ULTRASOUND  TECHNIQUE: Gray-scale sonography with graded compression, as well as color Doppler and duplex ultrasound were performed to evaluate the upper extremity deep venous system from the level of the subclavian vein and including the jugular, axillary, basilic, radial, ulnar and upper cephalic vein. Spectral Doppler was utilized to evaluate flow at rest and with distal augmentation maneuvers.  COMPARISON:   None.  FINDINGS: Contralateral Subclavian Vein: Respiratory phasicity is normal and symmetric with the symptomatic side. No evidence of thrombus. Normal compressibility.  Internal Jugular Vein: No evidence of thrombus. Normal compressibility, respiratory phasicity and response to augmentation.  Subclavian Vein: No evidence of thrombus. Normal compressibility, respiratory phasicity and response to augmentation.  Axillary Vein: No evidence of thrombus. Normal compressibility, respiratory phasicity and response to augmentation.  Cephalic Vein: No evidence of thrombus. Normal compressibility, respiratory phasicity and response to augmentation.  Basilic Vein: No evidence of thrombus. Normal compressibility, respiratory phasicity and response to augmentation.  Brachial Veins: No evidence of thrombus.  Normal compressibility, respiratory phasicity and response to augmentation.  Radial Veins: No evidence of thrombus. Normal compressibility, respiratory phasicity and response to augmentation.  Ulnar Veins: No evidence of thrombus. Normal compressibility, respiratory phasicity and response to augmentation.  Other Findings:  None visualized.  IMPRESSION: Sonographic survey of the right upper extremity negative for DVT  Electronically Signed   By: Corrie Mckusick D.O.   On: 01/25/2020 15:10 Note: Reviewed        Physical Exam  General appearance: Well nourished, well developed, and well hydrated. In no apparent acute distress Mental status: Alert, oriented x 3 (person, place, & time)       Respiratory: No evidence of acute respiratory distress Eyes: PERLA Vitals: BP (!) 144/79   Pulse 78   Temp (!) 96.7 F (35.9 C)   Resp 16   Ht 5' 11.5" (1.816 m)   Wt 252 lb (114.3 kg)   SpO2 100%   BMI 34.66 kg/m  BMI: Estimated body mass index is 34.66 kg/m as calculated from the following:   Height as of this encounter: 5' 11.5" (1.816 m).   Weight as of this encounter: 252 lb (114.3  kg). Ideal: Ideal body weight: 76.5 kg (168 lb 8.7 oz) Adjusted ideal body weight: 91.6 kg (201 lb 14.8 oz)  Assessment   Status Diagnosis  Controlled Controlled Controlled 1. Chronic thoracic back pain (Primary Area of Pain) (Right)   2. Chronic pain syndrome   3. Chronic low back pain (Left) with sciatica (Left)   4. Right upper quadrant abdominal pain (Tertiary Area of Pain)   5. Pharmacologic therapy   6. Intercostal neuralgia (T6, T7) (Right)   7. Rib pain on right side (Fourth Area of Pain)       Plan of Care   Justin [name] has a current medication list which includes the following long-term medication(s): albuterol, atorvastatin, gnp calcium 1200, enoxaparin, gabapentin, and [START ON 10/30/2020] oxycodone.  Pharmacotherapy (Medications Ordered): Meds ordered this encounter  Medications  . oxyCODONE (OXY IR/ROXICODONE) 5 MG immediate release tablet    Sig: Take 1 tablet (5 mg total) by mouth every 8 (eight) hours as needed for severe pain. Must last 30 days    Dispense:  90 tablet    Refill:  0    Chronic Pain: STOP Act (Not applicable) Fill 1 day early if closed on refill date. Avoid benzodiazepines within 8 hours of opioids   Follow-up plan:   Return in about 4 weeks (around 11/26/2020) for Medication Management, Dr Dossie Arbour.   Recent Visits No visits were found meeting these conditions. Showing recent visits within past 90 days and meeting all other requirements Today's Visits Date Type Provider Dept  10/29/20 Office Visit Gillis Santa, MD [facility]  Showing today's visits and meeting all other requirements Future Appointments Date Type Provider Dept  11/10/20 Appointment Milinda Pointer, MD [facility]  Showing future appointments within next 90 days and meeting all other requirements  I discussed the assessment and treatment plan with the patient. The patient was provided an opportunity to ask questions and all were answered.  The patient agreed with the plan and demonstrated an understanding of the instructions.  Patient advised to call back or seek an in-person evaluation if the symptoms or condition worsens.  Duration of encounter: 30 minutes.  Note by: Gillis Santa, MD Date: 10/29/2020; Time: 3:04 PM

## 2020-10-29 NOTE — Progress Notes (Signed)
Nursing Pain Medication Assessment:  Safety precautions to be maintained throughout the outpatient stay will include: orient to surroundings, keep bed in low position, maintain call bell within reach at all times, provide assistance with transfer out of bed and ambulation.  Medication Inspection Compliance: Pill count conducted under aseptic conditions, in front of the patient. Neither the pills nor the bottle was removed from the patient's sight at any time. Once count was completed pills were immediately returned to the patient in their original bottle.  Medication: See above Pill/Patch Count: 5 of 90 pills remain Pill/Patch Appearance: Markings consistent with prescribed medication Bottle Appearance: Standard pharmacy container. Clearly labeled. Filled Date: 04 / 26 / 2022 Last Medication intake:  Today

## 2020-11-07 ENCOUNTER — Other Ambulatory Visit: Payer: Self-pay | Admitting: Nurse Practitioner

## 2020-11-07 NOTE — Telephone Encounter (Signed)
Requested medications are due for refill today yes  Requested medications are on the active medication list yes  Last refill 5/8  Last visit seen 10/12/20 for bronchitis, unsure if to be continued  Future visit scheduled 01/27/21  Notes to clinic Please assess.

## 2020-11-07 NOTE — Telephone Encounter (Signed)
Scheduled 8/23

## 2020-11-10 ENCOUNTER — Encounter: Payer: 59 | Admitting: Pain Medicine

## 2020-11-19 ENCOUNTER — Other Ambulatory Visit: Payer: 59

## 2020-11-19 ENCOUNTER — Inpatient Hospital Stay (HOSPITAL_BASED_OUTPATIENT_CLINIC_OR_DEPARTMENT_OTHER): Payer: 59 | Admitting: Oncology

## 2020-11-19 ENCOUNTER — Encounter: Payer: Self-pay | Admitting: Oncology

## 2020-11-19 ENCOUNTER — Other Ambulatory Visit: Payer: Self-pay

## 2020-11-19 ENCOUNTER — Ambulatory Visit: Payer: 59 | Admitting: Oncology

## 2020-11-19 ENCOUNTER — Inpatient Hospital Stay: Payer: 59 | Attending: Oncology

## 2020-11-19 ENCOUNTER — Other Ambulatory Visit: Payer: Self-pay | Admitting: *Deleted

## 2020-11-19 VITALS — BP 141/91 | HR 72 | Temp 96.7°F | Resp 18 | Wt 254.6 lb

## 2020-11-19 DIAGNOSIS — I82511 Chronic embolism and thrombosis of right femoral vein: Secondary | ICD-10-CM

## 2020-11-19 DIAGNOSIS — I82B12 Acute embolism and thrombosis of left subclavian vein: Secondary | ICD-10-CM | POA: Diagnosis not present

## 2020-11-19 DIAGNOSIS — Z7901 Long term (current) use of anticoagulants: Secondary | ICD-10-CM | POA: Insufficient documentation

## 2020-11-19 DIAGNOSIS — R319 Hematuria, unspecified: Secondary | ICD-10-CM | POA: Insufficient documentation

## 2020-11-19 DIAGNOSIS — Z86718 Personal history of other venous thrombosis and embolism: Secondary | ICD-10-CM | POA: Insufficient documentation

## 2020-11-19 DIAGNOSIS — Z86711 Personal history of pulmonary embolism: Secondary | ICD-10-CM | POA: Insufficient documentation

## 2020-11-19 LAB — COMPREHENSIVE METABOLIC PANEL
ALT: 20 U/L (ref 0–44)
AST: 19 U/L (ref 15–41)
Albumin: 3.7 g/dL (ref 3.5–5.0)
Alkaline Phosphatase: 70 U/L (ref 38–126)
Anion gap: 5 (ref 5–15)
BUN: 13 mg/dL (ref 6–20)
CO2: 26 mmol/L (ref 22–32)
Calcium: 9.2 mg/dL (ref 8.9–10.3)
Chloride: 106 mmol/L (ref 98–111)
Creatinine, Ser: 1.39 mg/dL — ABNORMAL HIGH (ref 0.61–1.24)
GFR, Estimated: 60 mL/min — ABNORMAL LOW (ref >60)
Glucose, Bld: 115 mg/dL — ABNORMAL HIGH (ref 70–99)
Potassium: 3.9 mmol/L (ref 3.5–5.1)
Sodium: 137 mmol/L (ref 135–145)
Total Bilirubin: 0.6 mg/dL (ref 0.3–1.2)
Total Protein: 7.3 g/dL (ref 6.5–8.1)

## 2020-11-19 LAB — CBC WITH DIFFERENTIAL/PLATELET
Abs Immature Granulocytes: 0.04 10*3/uL (ref 0.00–0.07)
Basophils Absolute: 0 10*3/uL (ref 0.0–0.1)
Basophils Relative: 0 %
Eosinophils Absolute: 0.2 10*3/uL (ref 0.0–0.5)
Eosinophils Relative: 2 %
HCT: 44.1 % (ref 39.0–52.0)
Hemoglobin: 14.7 g/dL (ref 13.0–17.0)
Immature Granulocytes: 1 %
Lymphocytes Relative: 23 %
Lymphs Abs: 1.7 10*3/uL (ref 0.7–4.0)
MCH: 30.5 pg (ref 26.0–34.0)
MCHC: 33.3 g/dL (ref 30.0–36.0)
MCV: 91.5 fL (ref 80.0–100.0)
Monocytes Absolute: 0.9 10*3/uL (ref 0.1–1.0)
Monocytes Relative: 11 %
Neutro Abs: 4.8 10*3/uL (ref 1.7–7.7)
Neutrophils Relative %: 63 %
Platelets: 259 10*3/uL (ref 150–400)
RBC: 4.82 MIL/uL (ref 4.22–5.81)
RDW: 12.8 % (ref 11.5–15.5)
WBC: 7.7 10*3/uL (ref 4.0–10.5)
nRBC: 0 % (ref 0.0–0.2)

## 2020-11-19 NOTE — Progress Notes (Signed)
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 male with recurrent thrombosis x 4.  He developed a right upper extremity clot in 2010. He underwent thrombolysis.  He was on Coumadin x 6 months.  He developed a recurrent clot in the right upper extremity in 2011.  He underwent thoracic outlet decompression.  He was on Coumadin for 6-9 months.  He developed a right lower extremity DVT in 2016.  He was started on Xarelto.   He developed a left upper extremity DVT on 06/07/2016 while on Xarelto.  Duplex revealed a near occlusive DVT in the lateral aspect of the left subclavian vein and nonocclusive thrombus extending into the adjacent axillary vein.  He was started on Lovenox.  Hypercoagulable work-up was otherwise negative.  Lupus anticoagulant positive likely because he was on anticoagulation and therefore cannot be interpreted accurately.  Interval history-patient has been on Lovenox since 2018 and states that he has had blood clots on Lovenox although I do not see any objective evidence of DVT or PE since 2018 after he was started on Lovenox.  He is getting tired of taking shots twice a day and is open to considering alternative oral anticoagulation  Reports having blood in his urine for 2 or 3 days last week.  And stopped for about 3 to 4 days and then reports having some blood in his urine again today.  Reports that he did have some right-sided flank pain at the time he had hematuria which is getting better.  ECOG PS- 1 Pain scale- 0  Review of systems-  Review of Systems  Constitutional:  Positive for malaise/fatigue. Negative for chills, fever and weight loss.  HENT:  Negative for congestion, ear discharge and nosebleeds.   Eyes:  Negative for blurred vision.  Respiratory:  Negative for cough, hemoptysis, sputum production, shortness of breath and wheezing.   Cardiovascular:  Negative for chest pain, palpitations, orthopnea and claudication.  Gastrointestinal:  Negative for abdominal pain, blood in stool, constipation, diarrhea, heartburn, melena, nausea and vomiting.  Genitourinary:  Positive for hematuria. Negative for dysuria, flank pain, frequency and urgency.  Musculoskeletal:  Negative for back pain, joint pain and myalgias.  Skin:  Negative for rash.  Neurological:  Negative for dizziness, tingling, focal weakness, seizures, weakness and headaches.  Endo/Heme/Allergies:  Does not bruise/bleed easily.  Psychiatric/Behavioral:  Negative for depression and suicidal ideas. The patient does not have insomnia.       No Known Allergies   Past Medical History:  Diagnosis Date   Arm vein blood clot    right   Coronary artery disease    DVT (deep venous thrombosis) ([place]) 2012; January 2016   History of right arm; now right lower shotty   GERD (gastroesophageal reflux disease)    Rib pain on right side 08/04/2016   RT rib pain that radiates around to the back. Level 8 of 10.  Constant. Started 1 mo ago, 1 week after DVT.   Vascular thoracic outlet syndrome    Right      Past Surgical History:  Procedure Laterality Date  CAROTID ANGIOGRAM     COLONOSCOPY WITH PROPOFOL N/A 10/10/2019   Procedure: COLONOSCOPY WITH PROPOFOL;  Surgeon: Jonathon Bellows, MD;  Location: [facility] ENDOSCOPY;  Service: Gastroenterology;  Laterality: N/A;   KNEE CARTILAGE SURGERY     Performed for cracked patella and torn meniscus   Right upper extremity thrombectomy  2012   SCALENOTOMY W/O RESECTION CERVICAL RIB  2012   4 right-sided thoracic outlet syndrome     Social History   Socioeconomic History   Marital status: Married    Spouse name: Not on file   Number of children: Not on file   Years of education: Not on file   Highest education level: Not on file  Occupational History    Comment: [employer]  Tobacco Use   Smoking status: Former    Packs/day: 0.50    Pack years: 0.00    Types: Cigarettes   Smokeless tobacco: Never   Tobacco comments:    quit smoking in 2010  Vaping Use   Vaping Use: Never used  Substance and Sexual Activity   Alcohol use: No   Drug use: No   Sexual activity: Yes    Birth control/protection: None  Other Topics Concern   Not on file  Social History Narrative   He works full-time at [employer].    He is married. Nonsmoker who is not currently. Alcohol.   Social Determinants of Health   Financial Resource Strain: Not on file  Food Insecurity: Not on file  Transportation Needs: Not on file  Physical Activity: Not on file  Stress: Not on file  Social Connections: Not on file  Intimate Partner Violence: Not on file    Family History  Problem Relation Age of Onset   Heart disease Father    Breast cancer Father 58   Diabetes Father    Heart attack Father    Cancer Maternal Grandmother    Leukemia Maternal Grandfather    Prostate cancer Maternal Uncle      Current Outpatient Medications:    acetaminophen (TYLENOL) 500 MG tablet, Take 1,000 mg by mouth every 6 (six) hours as needed., Disp: , Rfl:    albuterol (VENTOLIN HFA) 108 (90 Base) MCG/ACT inhaler, TAKE 2 PUFFS BY MOUTH EVERY 6 HOURS AS NEEDED FOR WHEEZE OR SHORTNESS OF BREATH, Disp: 8.5 each, Rfl: 0   atorvastatin (LIPITOR) 80 MG tablet, Take 1 tablet (80 mg total) by mouth daily., Disp: 90 tablet, Rfl: 1   benzonatate (TESSALON) 100 MG capsule, Take 1 capsule (100 mg total) by mouth 2 (two) times daily as needed for cough., Disp: 20 capsule, Rfl: 0   Calcium Carbonate-Vit D-Min (GNP CALCIUM 1200) 1200-1000 MG-UNIT CHEW, Chew 1,200 mg by mouth  daily with breakfast. Take in combination with vitamin D and magnesium., Disp: 30 tablet, Rfl: 2   cyclobenzaprine (FEXMID) 7.5 MG tablet, Take 1 tablet (7.5 mg total) by mouth at bedtime as needed for muscle spasms., Disp: 20 tablet, Rfl: 0   enoxaparin (LOVENOX) 150 MG/ML injection, INJECT 0.74 MLS (110 MG TOTAL) INTO THE SKIN 2 (TWO) TIMES DAILY, Disp: 133.2 mL, Rfl: 3   ibuprofen (ADVIL,MOTRIN) 800 MG tablet, Take 800 mg by mouth every 8 (eight) hours as needed., Disp: , Rfl:    oxyCODONE (OXY IR/ROXICODONE) 5 MG immediate release tablet, Take 1 tablet (5 mg total) by mouth every 8 (eight) hours as needed for severe pain. Must last 30 days, Disp: 90 tablet, Rfl: 0   azithromycin (ZITHROMAX) 250 MG tablet, Take as directed. (  Patient not taking: Reported on 11/19/2020), Disp: 6 tablet, Rfl: 0   gabapentin (NEURONTIN) 600 MG tablet, Take 1 tablet (600 mg total) by mouth 2 (two) times daily. (Patient not taking: Reported on 11/19/2020), Disp: 180 tablet, Rfl: 1  Physical exam:  Vitals:   11/19/20 1418  BP: (!) 141/91  Pulse: 72  Resp: 18  Temp: (!) 96.7 F (35.9 C)  SpO2: 99%  Weight: 254 lb 10.1 oz (115.5 kg)   Physical Exam Constitutional:      General: He is not in acute distress. Cardiovascular:     Rate and Rhythm: Normal rate and regular rhythm.     Heart sounds: Normal heart sounds.  Pulmonary:     Effort: Pulmonary effort is normal.     Breath sounds: Normal breath sounds.  Abdominal:     General: Bowel sounds are normal.     Palpations: Abdomen is soft.  Skin:    General: Skin is warm and dry.  Neurological:     Mental Status: He is alert and oriented to person, place, and time.     CMP Latest Ref Rng & Units 11/19/2020  Glucose 70 - 99 mg/dL 115(H)  BUN 6 - 20 mg/dL 13  Creatinine 0.61 - 1.24 mg/dL 1.39(H)  Sodium 135 - 145 mmol/L 137  Potassium 3.5 - 5.1 mmol/L 3.9  Chloride 98 - 111 mmol/L 106  CO2 22 - 32 mmol/L 26  Calcium 8.9 - 10.3 mg/dL 9.2  Total Protein  6.5 - 8.1 g/dL 7.3  Total Bilirubin 0.3 - 1.2 mg/dL 0.6  Alkaline Phos 38 - 126 U/L 70  AST 15 - 41 U/L 19  ALT 0 - 44 U/L 20   CBC Latest Ref Rng & Units 11/19/2020  WBC 4.0 - 10.5 K/uL 7.7  Hemoglobin 13.0 - 17.0 g/dL 14.7  Hematocrit 39.0 - 52.0 % 44.1  Platelets 150 - 400 K/uL 259      Assessment and plan- Patient is a 55 y.o. male with history of recurrent DVT/PE currently on Lovenox and this is a routine follow-up visit  Recurrent DVT/PE: Patient has been on Lovenox since 2018.  He states that he has had blood clots on Lovenox but I am not seeing any imaging studies at the [facility] health [facility] that suggested a repeat DVT or PE since 2018.  Lovenox does seem to be working well for him although he is getting tired of taking injections twice a day for the last 5 years.  We discussed consideration for Eliquis which she has not tried before.  We will have to see what his insurance payment and co-pay would be.  We will get back to him with this information but until then he will stay on Lovenox.  Hematuria: Patient states that this has been a new issue over the last 1 week.  He did have some flank pain along with it which appears to be improving.  Hemoglobin is stable.  He will continue to monitor his symptoms and if they persist he will let us know and I will refer him to Dr. Bernardo Heater who he has seen in the past for erectile dysfunction.  CBC with differential CMP and see me in 6 months   Visit Diagnosis 1. Left subclavian vein thrombosis ([place])      Dr. Randa Evens, MD, MPH [facility] [phone] 11/19/2020 4:54 PM

## 2020-11-19 NOTE — Progress Notes (Signed)
Last Monday (6/6) pt went to the bathroom;  he noticed blood in his urine monday-Wednesday, lower back was also hurting. When he woke up friday he didn't notice any blood; lower back pain got better. Everything was fine until today, he saw blood in his urine once again. Also states it was painful while using the bathroom.  Also states his endurance has decreased tremendously, small tasks make him tired very quickly.

## 2020-11-23 DIAGNOSIS — Z79891 Long term (current) use of opiate analgesic: Secondary | ICD-10-CM | POA: Insufficient documentation

## 2020-11-23 NOTE — Progress Notes (Signed)
PROVIDER NOTE: Information contained herein reflects review and annotations entered in association with encounter. Interpretation of such information and data should be left to medically-trained personnel. Information provided to patient can be located elsewhere in the medical record under "Patient Instructions". Document created using STT-dictation technology, any transcriptional errors that may result from process are unintentional.    Patient: Justin [name]  Service Category: E/M  Provider: Gaspar Cola, MD  DOB: 18-Aug-1965  DOS: 11/24/2020  Specialty: Interventional Pain Management  MRN: [id number]  Setting: Ambulatory outpatient  PCP: Jon Billings, NP  Type: Established Patient    Referring Provider: Jon Billings, NP  Location: Office  Delivery: Face-to-face     HPI  Justin [name], a 55 y.o. year old male, is here today because of his Chronic pain syndrome [G89.4]. Justin [name] primary complain today is Abdominal Pain (Intercostal pain on the right ) Last encounter: My last encounter with him was on 07/28/2020. 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 Pain Assessment: Severity of Chronic pain is reported as a 7 /10. Location: Abdomen Right/intercostal pain radiates around to the back.. Onset: More than a month ago. Quality: Aching, Constant, Discomfort, Sharp, Stabbing. Timing: Constant. Modifying factor(s): TENS unit. Vitals:  height is _0  (1.803 m) and weight is 253 lb (114.8 kg). His temporal temperature is 96.7 F (35.9 C)  (abnormal). His blood pressure is 151/74 (abnormal) and his pulse is 60. His respiration is 16 and oxygen saturation is 100%.   Reason for encounter: medication management.   The patient indicates doing well with the current medication regimen. No adverse reactions or side effects reported to the medications.   RTCB: 02/27/2021 Nonopioids transferred 03/24/2020: Turmeric, Calcium  Pharmacotherapy Assessment  Analgesic: Oxycodone IR 5 mg, 1 tab PO q 8 hrs (15 mg/day of oxycodone)  MME/day: 22.5 mg/day.   Monitoring: [place] PMP: PDMP reviewed during this encounter.       Pharmacotherapy: No side-effects or adverse reactions reported. Compliance: No problems identified. Effectiveness: Clinically acceptable.  Janett Billow, RN  11/24/2020  9:05 AM  Sign when Signing Visit Nursing Pain Medication Assessment:  Safety precautions to be maintained throughout the outpatient stay will include: orient to surroundings, keep bed in low position, maintain call bell within reach at all times, provide assistance with transfer out of bed and ambulation.  Medication Inspection Compliance: Pill count conducted under aseptic conditions, in front of the patient. Neither the pills nor the bottle was removed from the patient's sight at any time. Once count was completed pills were immediately returned to the patient in their original bottle.  Medication: Oxycodone IR Pill/Patch Count:  21 of 90 pills remain Pill/Patch Appearance: Markings consistent with prescribed medication Bottle Appearance: Standard pharmacy container. Clearly labeled. Filled Date: 05 / 26 / 2022 Last Medication intake:  Today    UDS:  Summary  Date Value Ref Range Status  10/04/2019 Note  Final    Comment:    ==================================================================== [employer] Select 13 (MW) ==================================================================== Test  Result       Flag        Units Drug Present and Declared for Prescription Verification   Oxycodone                      617          EXPECTED   ng/mg creat   Oxymorphone                    312          EXPECTED   ng/mg creat   Noroxycodone                   680          EXPECTED   ng/mg creat   Noroxymorphone                 55           EXPECTED   ng/mg creat    Sources of oxycodone are scheduled prescription medications.    Oxymorphone, noroxycodone, and noroxymorphone are expected    metabolites of oxycodone. Oxymorphone is also available as a    scheduled prescription medication. ==================================================================== Test                      Result    Flag   Units      Ref Range   Creatinine              163              mg/dL      >=20 ==================================================================== Declared Medications:  The flagging and interpretation on this report are based on the  following declared medications.  Unexpected results may arise from  inaccuracies in the declared medications.  **Note: The testing scope of this panel includes these medications:  Oxycodone  **Note: The testing scope of this panel does not include the  following reported medications:  Acetaminophen  Atorvastatin  Calcium  Cyclobenzaprine  Enoxaparin (Lovenox)  Gabapentin  Ibuprofen  Prednisone  Vitamin D ==================================================================== For clinical consultation, please call [phone]. ====================================================================      ROS  Constitutional: Denies any fever or chills Gastrointestinal: No reported hemesis, hematochezia, vomiting, or acute GI distress Musculoskeletal: Denies any acute onset joint swelling, redness, loss of ROM, or weakness Neurological: No reported episodes of acute onset apraxia, aphasia, dysarthria, agnosia, amnesia, paralysis, loss of coordination, or loss of  consciousness  Medication Review  GNP Calcium 1200, acetaminophen, albuterol, atorvastatin, enoxaparin, ibuprofen, and oxyCODONE  History Review  Allergy: Justin [name] has No Known Allergies. Drug: Justin [name]  reports no history of drug use. Alcohol:  reports no history of alcohol use. Tobacco:  reports that he has quit smoking. His smoking use included cigarettes. He smoked an average of 0.50 packs per day. He has never used smokeless tobacco. Social: Justin [name]  reports that he has quit smoking. His smoking use included cigarettes. He smoked an average of 0.50 packs per day. He has never used smokeless tobacco. He reports that he does not drink alcohol and does not use drugs. Medical:  has a past medical history of Arm vein blood clot, Coronary artery disease, DVT (deep venous thrombosis) ([place]) (2012; January 2016), GERD (gastroesophageal reflux disease), Rib pain on right side (08/04/2016), and Vascular thoracic outlet syndrome. Surgical: Mr. Mcmiller  has a past surgical history that includes Knee cartilage surgery; Carotid angiogram; Scalenotomy w/o resection cervical  rib (2012); Right upper extremity thrombectomy (2012); and Colonoscopy with propofol (N/A, 10/10/2019). Family: family history includes Breast cancer (age of onset: 14) in his father; Cancer in his maternal grandmother; Diabetes in his father; Heart attack in his father; Heart disease in his father; Leukemia in his maternal grandfather; Prostate cancer in his maternal uncle.  Laboratory Chemistry Profile   Renal Lab Results  Component Value Date   BUN 13 11/19/2020   CREATININE 1.39 (H) 11/19/2020   BCR 9 01/23/2020   GFRAA >60 02/19/2020   GFRNONAA 60 (L) 11/19/2020     Hepatic Lab Results  Component Value Date   AST 19 11/19/2020   ALT 20 11/19/2020   ALBUMIN 3.7 11/19/2020   ALKPHOS 70 11/19/2020   LIPASE 90 03/02/2014     Electrolytes Lab Results  Component Value Date   NA 137 11/19/2020   K 3.9 11/19/2020    CL 106 11/19/2020   CALCIUM 9.2 11/19/2020   MG 2.0 07/28/2017     Bone Lab Results  Component Value Date   VD25OH 28.7 (L) 07/30/2020   25OHVITD1 15 (L) 06/29/2018   25OHVITD2 8.4 06/29/2018   25OHVITD3 6.8 06/29/2018     Inflammation (CRP: Acute Phase) (ESR: Chronic Phase) Lab Results  Component Value Date   CRP 12 (H) 06/29/2018   ESRSEDRATE 27 06/29/2018   LATICACIDVEN 0.9 07/20/2016       Note: Above Lab results reviewed.  Recent Imaging Review  US Venous Img Upper Uni Right(DVT) CLINICAL DATA:  55 year old male with a history of upper extremity thrombosis, new right arm swelling  EXAM: RIGHT UPPER EXTREMITY VENOUS DOPPLER ULTRASOUND  TECHNIQUE: Gray-scale sonography with graded compression, as well as color Doppler and duplex ultrasound were performed to evaluate the upper extremity deep venous system from the level of the subclavian vein and including the jugular, axillary, basilic, radial, ulnar and upper cephalic vein. Spectral Doppler was utilized to evaluate flow at rest and with distal augmentation maneuvers.  COMPARISON:  None.  FINDINGS: Contralateral Subclavian Vein: Respiratory phasicity is normal and symmetric with the symptomatic side. No evidence of thrombus. Normal compressibility.  Internal Jugular Vein: No evidence of thrombus. Normal compressibility, respiratory phasicity and response to augmentation.  Subclavian Vein: No evidence of thrombus. Normal compressibility, respiratory phasicity and response to augmentation.  Axillary Vein: No evidence of thrombus. Normal compressibility, respiratory phasicity and response to augmentation.  Cephalic Vein: No evidence of thrombus. Normal compressibility, respiratory phasicity and response to augmentation.  Basilic Vein: No evidence of thrombus. Normal compressibility, respiratory phasicity and response to augmentation.  Brachial Veins: No evidence of thrombus. Normal  compressibility, respiratory phasicity and response to augmentation.  Radial Veins: No evidence of thrombus. Normal compressibility, respiratory phasicity and response to augmentation.  Ulnar Veins: No evidence of thrombus. Normal compressibility, respiratory phasicity and response to augmentation.  Other Findings:  None visualized.  IMPRESSION: Sonographic survey of the right upper extremity negative for DVT  Electronically Signed   By: Corrie Mckusick D.O.   On: 01/25/2020 15:10 Note: Reviewed        Physical Exam  General appearance: Well nourished, well developed, and well hydrated. In no apparent acute distress Mental status: Alert, oriented x 3 (person, place, & time)       Respiratory: No evidence of acute respiratory distress Eyes: PERLA Vitals: BP (!) 151/74 (BP Location: Left Arm, Patient Position: Sitting, Cuff Size: Large)   Pulse 60   Temp (!) 96.7 F (35.9 C) (Temporal)   Resp  16   Ht _0  (1.803 m)   Wt 253 lb (114.8 kg)   SpO2 100%   BMI 35.29 kg/m  BMI: Estimated body mass index is 35.29 kg/m as calculated from the following:   Height as of this encounter: _1  (1.803 m).   Weight as of this encounter: 253 lb (114.8 kg). Ideal: Ideal body weight: 75.3 kg (166 lb 0.1 oz) Adjusted ideal body weight: 91.1 kg (200 lb 12.9 oz)  Assessment   Status Diagnosis  Controlled Controlled Controlled 1. Chronic pain syndrome   2. Chronic thoracic back pain (Primary Area of Pain) (Right)   3. Chronic low back pain (Left) with sciatica (Left)   4. Right upper quadrant abdominal pain (Tertiary Area of Pain)   5. Pharmacologic therapy   6. Chronic use of opiate for therapeutic purpose      Updated Problems: No problems updated.  Plan of Care  Problem-specific:  No problem-specific Assessment & Plan notes found for this encounter.  Justin [name] has a current medication list which includes the following long-term medication(s): albuterol,  atorvastatin, gnp calcium 1200, enoxaparin, [START ON 11/29/2020] oxycodone, [START ON 12/29/2020] oxycodone, and [START ON 01/28/2021] oxycodone.  Pharmacotherapy (Medications Ordered): Meds ordered this encounter  Medications   oxyCODONE (OXY IR/ROXICODONE) 5 MG immediate release tablet    Sig: Take 1 tablet (5 mg total) by mouth every 8 (eight) hours as needed for severe pain. Must last 30 days    Dispense:  90 tablet    Refill:  0    Not a duplicate. Do NOT delete! Dispense 1 day early if closed on refill date. Avoid benzodiazepines within 8 hours of opioids. Do not send refill requests.   oxyCODONE (OXY IR/ROXICODONE) 5 MG immediate release tablet    Sig: Take 1 tablet (5 mg total) by mouth every 8 (eight) hours as needed for severe pain. Must last 30 days    Dispense:  90 tablet    Refill:  0    Not a duplicate. Do NOT delete! Dispense 1 day early if closed on refill date. Avoid benzodiazepines within 8 hours of opioids. Do not send refill requests.   oxyCODONE (OXY IR/ROXICODONE) 5 MG immediate release tablet    Sig: Take 1 tablet (5 mg total) by mouth every 8 (eight) hours as needed for severe pain. Must last 30 days    Dispense:  90 tablet    Refill:  0    Not a duplicate. Do NOT delete! Dispense 1 day early if closed on refill date. Avoid benzodiazepines within 8 hours of opioids. Do not send refill requests.    Orders:  Orders Placed This Encounter  Procedures   [employer] Select 13 (MW), Urine    Volume: 30 ml(s). Minimum 3 ml of urine is needed. Document temperature of fresh sample. Indications: Long term (current) use of opiate analgesic (E70.350)    Order Specific Question:   Release to patient    Answer:   Immediate    Follow-up plan:   Return in about 3 months (around 02/27/2021) for evaluation day (F2F) (MM).     Interventional management options:  Considering:   Diagnostic right thoracic SNRB  Diagnostic right thoracic facet block  Possible right thoracic facet  medial branch RFA    Palliative PRN treatment(s):   Palliative right T6, T7, T8, & T9 intercostal nerve block  Palliative right T6, T7, T8, & T9 intercostal nerve RFA #2 (Last done 12/29/17)    Recent  Visits Date Type Provider Dept  10/29/20 Office Visit Gillis Santa, MD [facility]  Showing recent visits within past 90 days and meeting all other requirements Today's Visits Date Type Provider Dept  11/24/20 Office Visit Milinda Pointer, MD [facility]  Showing today's visits and meeting all other requirements Future Appointments No visits were found meeting these conditions. Showing future appointments within next 90 days and meeting all other requirements I discussed the assessment and treatment plan with the patient. The patient was provided an opportunity to ask questions and all were answered. The patient agreed with the plan and demonstrated an understanding of the instructions.  Patient advised to call back or seek an in-person evaluation if the symptoms or condition worsens.  Duration of encounter: 30 minutes.  Note by: Gaspar Cola, MD Date: 11/24/2020; Time: 10:00 AM

## 2020-11-24 ENCOUNTER — Ambulatory Visit: Payer: 59 | Attending: Pain Medicine | Admitting: Pain Medicine

## 2020-11-24 ENCOUNTER — Telehealth: Payer: Self-pay | Admitting: *Deleted

## 2020-11-24 ENCOUNTER — Other Ambulatory Visit (HOSPITAL_COMMUNITY): Payer: Self-pay

## 2020-11-24 ENCOUNTER — Encounter: Payer: Self-pay | Admitting: Pain Medicine

## 2020-11-24 ENCOUNTER — Other Ambulatory Visit: Payer: Self-pay

## 2020-11-24 VITALS — BP 151/74 | HR 60 | Temp 96.7°F | Resp 16 | Ht 71.0 in | Wt 253.0 lb

## 2020-11-24 DIAGNOSIS — R1011 Right upper quadrant pain: Secondary | ICD-10-CM | POA: Insufficient documentation

## 2020-11-24 DIAGNOSIS — G8929 Other chronic pain: Secondary | ICD-10-CM | POA: Diagnosis present

## 2020-11-24 DIAGNOSIS — Z79899 Other long term (current) drug therapy: Secondary | ICD-10-CM | POA: Diagnosis present

## 2020-11-24 DIAGNOSIS — M5442 Lumbago with sciatica, left side: Secondary | ICD-10-CM | POA: Diagnosis present

## 2020-11-24 DIAGNOSIS — G894 Chronic pain syndrome: Secondary | ICD-10-CM | POA: Diagnosis present

## 2020-11-24 DIAGNOSIS — Z79891 Long term (current) use of opiate analgesic: Secondary | ICD-10-CM | POA: Diagnosis present

## 2020-11-24 DIAGNOSIS — M546 Pain in thoracic spine: Secondary | ICD-10-CM | POA: Diagnosis present

## 2020-11-24 MED ORDER — OXYCODONE HCL 5 MG PO TABS: 5.0000 mg | ORAL_TABLET | Freq: Three times a day (TID) | ORAL | 0 refills | Status: DC | PRN

## 2020-11-24 NOTE — Patient Instructions (Signed)
____________________________________________________________________________________________  Medication Rules  Purpose: To inform patients, and their family members, of our rules and regulations.  Applies to: All patients receiving prescriptions (written or electronic).  Pharmacy of record: Pharmacy where electronic prescriptions will be sent. If written prescriptions are taken to a different pharmacy, please inform the nursing staff. The pharmacy listed in the electronic medical record should be the one where you would like electronic prescriptions to be sent.  Electronic prescriptions: In compliance with the [place] Strengthen Opioid Misuse Prevention (STOP) Act of 2017 (Session Law 2017-74/H243), effective June 07, 2018, all controlled substances must be electronically prescribed. Calling prescriptions to the pharmacy will cease to exist.  Prescription refills: Only during scheduled appointments. Applies to all prescriptions.  NOTE: The following applies primarily to controlled substances (Opioid* Pain Medications).   Type of encounter (visit): For patients receiving controlled substances, face-to-face visits are required. (Not an option or up to the patient.)  Patient's responsibilities: Pain Pills: Bring all pain pills to every appointment (except for procedure appointments). Pill Bottles: Bring pills in original pharmacy bottle. Always bring the newest bottle. Bring bottle, even if empty. Medication refills: You are responsible for knowing and keeping track of what medications you take and those you need refilled. The day before your appointment: write a list of all prescriptions that need to be refilled. The day of the appointment: give the list to the admitting nurse. Prescriptions will be written only during appointments. No prescriptions will be written on procedure days. If you forget a medication: it will not be "Called in", "Faxed", or "electronically sent". You will  need to get another appointment to get these prescribed. No early refills. Do not call asking to have your prescription filled early. Prescription Accuracy: You are responsible for carefully inspecting your prescriptions before leaving our office. Have the discharge nurse carefully go over each prescription with you, before taking them home. Make sure that your name is accurately spelled, that your address is correct. Check the name and dose of your medication to make sure it is accurate. Check the number of pills, and the written instructions to make sure they are clear and accurate. Make sure that you are given enough medication to last until your next medication refill appointment. Taking Medication: Take medication as prescribed. When it comes to controlled substances, taking less pills or less frequently than prescribed is permitted and encouraged. Never take more pills than instructed. Never take medication more frequently than prescribed.  Inform other Doctors: Always inform, all of your healthcare providers, of all the medications you take. Pain Medication from other Providers: You are not allowed to accept any additional pain medication from any other Doctor or Healthcare provider. There are two exceptions to this rule. (see below) In the event that you require additional pain medication, you are responsible for notifying us, as stated below. Cough Medicine: Often these contain an opioid, such as codeine or hydrocodone. Never accept or take cough medicine containing these opioids if you are already taking an opioid* medication. The combination may cause respiratory failure and death. Medication Agreement: You are responsible for carefully reading and following our Medication Agreement. This must be signed before receiving any prescriptions from our practice. Safely store a copy of your signed Agreement. Violations to the Agreement will result in no further prescriptions. (Additional copies of our  Medication Agreement are available upon request.) Laws, Rules, & Regulations: All patients are expected to follow all Federal and State Laws, Statutes, Rules, & Regulations. Ignorance of   the Laws does not constitute a valid excuse.  Illegal drugs and Controlled Substances: The use of illegal substances (including, but not limited to marijuana and its derivatives) and/or the illegal use of any controlled substances is strictly prohibited. Violation of this rule may result in the immediate and permanent discontinuation of any and all prescriptions being written by our practice. The use of any illegal substances is prohibited. Adopted CDC guidelines & recommendations: Target dosing levels will be at or below 60 MME/day. Use of benzodiazepines** is not recommended.  Exceptions: There are only two exceptions to the rule of not receiving pain medications from other Healthcare Providers. Exception #1 (Emergencies): In the event of an emergency (i.e.: accident requiring emergency care), you are allowed to receive additional pain medication. However, you are responsible for: As soon as you are able, call our office [phone], at any time of the day or night, and leave a message stating your name, the date and nature of the emergency, and the name and dose of the medication prescribed. In the event that your call is answered by a member of our staff, make sure to document and save the date, time, and the name of the person that took your information.  Exception #2 (Planned Surgery): In the event that you are scheduled by another doctor or dentist to have any type of surgery or procedure, you are allowed (for a period no longer than 30 days), to receive additional pain medication, for the acute post-op pain. However, in this case, you are responsible for picking up a copy of our "Post-op Pain Management for Surgeons" handout, and giving it to your surgeon or dentist. This document is available at our office, and  does not require an appointment to obtain it. Simply go to our office during business hours (Monday-Thursday from 8:00 AM to 4:00 PM) (Friday 8:00 AM to 12:00 Noon) or if you have a scheduled appointment with us, prior to your surgery, and ask for it by name. In addition, you are responsible for: calling our office [phone], at any time of the day or night, and leaving a message stating your name, name of your surgeon, type of surgery, and date of procedure or surgery. Failure to comply with your responsibilities may result in termination of therapy involving the controlled substances.  *Opioid medications include: morphine, codeine, oxycodone, oxymorphone, hydrocodone, hydromorphone, meperidine, tramadol, tapentadol, buprenorphine, fentanyl, methadone. **Benzodiazepine medications include: diazepam (Valium), alprazolam (Xanax), clonazepam (Klonopine), lorazepam (Ativan), clorazepate (Tranxene), chlordiazepoxide (Librium), estazolam (Prosom), oxazepam (Serax), temazepam (Restoril), triazolam (Halcion) (Last updated: 05/05/2020) ____________________________________________________________________________________________  ____________________________________________________________________________________________  Medication Recommendations and Reminders  Applies to: All patients receiving prescriptions (written and/or electronic).  Medication Rules & Regulations: These rules and regulations exist for your safety and that of others. They are not flexible and neither are we. Dismissing or ignoring them will be considered "non-compliance" with medication therapy, resulting in complete and irreversible termination of such therapy. (See document titled "Medication Rules" for more details.) In all conscience, because of safety reasons, we cannot continue providing a therapy where the patient does not follow instructions.  Pharmacy of record:  Definition: This is the pharmacy where your electronic  prescriptions will be sent.  We do not endorse any particular pharmacy, however, we have experienced problems with [facility] not securing enough medication supply for the community. We do not restrict you in your choice of pharmacy. However, once we write for your prescriptions, we will NOT be re-sending more prescriptions to fix restricted supply problems   created by your pharmacy, or your insurance.  The pharmacy listed in the electronic medical record should be the one where you want electronic prescriptions to be sent. If you choose to change pharmacy, simply notify our nursing staff.  Recommendations: Keep all of your pain medications in a safe place, under lock and key, even if you live alone. We will NOT replace lost, stolen, or damaged medication. After you fill your prescription, take 1 week's worth of pills and put them away in a safe place. You should keep a separate, properly labeled bottle for this purpose. The remainder should be kept in the original bottle. Use this as your primary supply, until it runs out. Once it's gone, then you know that you have 1 week's worth of medicine, and it is time to come in for a prescription refill. If you do this correctly, it is unlikely that you will ever run out of medicine. To make sure that the above recommendation works, it is very important that you make sure your medication refill appointments are scheduled at least 1 week before you run out of medicine. To do this in an effective manner, make sure that you do not leave the office without scheduling your next medication management appointment. Always ask the nursing staff to show you in your prescription , when your medication will be running out. Then arrange for the receptionist to get you a return appointment, at least 7 days before you run out of medicine. Do not wait until you have 1 or 2 pills left, to come in. This is very poor planning and does not take into consideration that we may need to  cancel appointments due to bad weather, sickness, or emergencies affecting our staff. DO NOT ACCEPT A "Partial Fill": If for any reason your pharmacy does not have enough pills/tablets to completely fill or refill your prescription, do not allow for a "partial fill". The law allows the pharmacy to complete that prescription within 72 hours, without requiring a new prescription. If they do not fill the rest of your prescription within those 72 hours, you will need a separate prescription to fill the remaining amount, which we will NOT provide. If the reason for the partial fill is your insurance, you will need to talk to the pharmacist about payment alternatives for the remaining tablets, but again, DO NOT ACCEPT A PARTIAL FILL, unless you can trust your pharmacist to obtain the remainder of the pills within 72 hours.  Prescription refills and/or changes in medication(s):  Prescription refills, and/or changes in dose or medication, will be conducted only during scheduled medication management appointments. (Applies to both, written and electronic prescriptions.) No refills on procedure days. No medication will be changed or started on procedure days. No changes, adjustments, and/or refills will be conducted on a procedure day. Doing so will interfere with the diagnostic portion of the procedure. No phone refills. No medications will be "called into the pharmacy". No Fax refills. No weekend refills. No Holliday refills. No after hours refills.  Remember:  Business hours are:  Monday to Thursday 8:00 AM to 4:00 PM Provider's Schedule: Islay Polanco, MD - Appointments are:  Medication management: Monday and Wednesday 8:00 AM to 4:00 PM Procedure day: Tuesday and Thursday 7:30 AM to 4:00 PM Bilal Lateef, MD - Appointments are:  Medication management: Tuesday and Thursday 8:00 AM to 4:00 PM Procedure day: Monday and Wednesday 7:30 AM to 4:00 PM (Last update:  12/26/2019) ____________________________________________________________________________________________  ____________________________________________________________________________________________  CBD (cannabidiol) WARNING    Applicable to: All individuals currently taking or considering taking CBD (cannabidiol) and, more important, all patients taking opioid analgesic controlled substances (pain medication). (Example: oxycodone; oxymorphone; hydrocodone; hydromorphone; morphine; methadone; tramadol; tapentadol; fentanyl; buprenorphine; butorphanol; dextromethorphan; meperidine; codeine; etc.)  Legal status: CBD remains a Schedule I drug prohibited for any use. CBD is illegal with one exception. In the [place], CBD has a limited Food and Drug Administration (FDA) approval for the treatment of two specific types of epilepsy disorders. Only one CBD product has been approved by the FDA for this purpose: "Epidiolex". FDA is aware that some companies are marketing products containing cannabis and cannabis-derived compounds in ways that violate the Federal Food, Drug and Cosmetic Act (FD&C Act) and that may put the health and safety of consumers at risk. The FDA, a Federal agency, has not enforced the CBD status since 2018.   Legality: Some manufacturers ship CBD products nationally, which is illegal. Often such products are sold online and are therefore available throughout the country. CBD is openly sold in head shops and health food stores in some states where such sales have not been explicitly legalized. Selling unapproved products with unsubstantiated therapeutic claims is not only a violation of the law, but also can put patients at risk, as these products have not been proven to be safe or effective. Federal illegality makes it difficult to conduct research on CBD.  Reference: "FDA Regulation of Cannabis and Cannabis-Derived Products, Including Cannabidiol (CBD)" -  https://www.fda.gov/news-events/public-health-focus/fda-regulation-cannabis-and-cannabis-derived-products-including-cannabidiol-cbd  Warning: CBD is not FDA approved and has not undergo the same manufacturing controls as prescription drugs.  This means that the purity and safety of available CBD may be questionable. Most of the time, despite manufacturer's claims, it is contaminated with THC (delta-9-tetrahydrocannabinol - the chemical in marijuana responsible for the "HIGH").  When this is the case, the THC contaminant will trigger a positive urine drug screen (UDS) test for Marijuana (carboxy-THC). Because a positive UDS for any illicit substance is a violation of our medication agreement, your opioid analgesics (pain medicine) may be permanently discontinued.  MORE ABOUT CBD  General Information: CBD  is a derivative of the Marijuana (cannabis sativa) plant discovered in 1940. It is one of the 113 identified substances found in Marijuana. It accounts for up to 40% of the plant's extract. As of 2018, preliminary clinical studies on CBD included research for the treatment of anxiety, movement disorders, and pain. CBD is available and consumed in multiple forms, including inhalation of smoke or vapor, as an aerosol spray, and by mouth. It may be supplied as an oil containing CBD, capsules, dried cannabis, or as a liquid solution. CBD is thought not to be as psychoactive as THC (delta-9-tetrahydrocannabinol - the chemical in marijuana responsible for the "HIGH"). Studies suggest that CBD may interact with different biological target receptors in the body, including cannabinoid and other neurotransmitter receptors. As of 2018 the mechanism of action for its biological effects has not been determined.  Side-effects  Adverse reactions: Dry mouth, diarrhea, decreased appetite, fatigue, drowsiness, malaise, weakness, sleep disturbances, and others.  Drug interactions: CBC may interact with other medications  such as blood-thinners. (Last update: 01/12/2020) ____________________________________________________________________________________________  ____________________________________________________________________________________________  Drug Holidays (Slow)  What is a "Drug Holiday"? Drug Holiday: is the name given to the period of time during which a patient stops taking a medication(s) for the purpose of eliminating tolerance to the drug.  Benefits Improved effectiveness of opioids. Decreased opioid dose needed to achieve benefits. Improved pain with lesser dose.    What is tolerance? Tolerance: is the progressive decreased in effectiveness of a drug due to its repetitive use. With repetitive use, the body gets use to the medication and as a consequence, it loses its effectiveness. This is a common problem seen with opioid pain medications. As a result, a larger dose of the drug is needed to achieve the same effect that used to be obtained with a smaller dose.  How long should a "Drug Holiday" last? You should stay off of the pain medicine for at least 14 consecutive days. (2 weeks)  Should I stop the medicine "cold turkey"? No. You should always coordinate with your Pain Specialist so that he/she can provide you with the correct medication dose to make the transition as smoothly as possible.  How do I stop the medicine? Slowly. You will be instructed to decrease the daily amount of pills that you take by one (1) pill every seven (7) days. This is called a "slow downward taper" of your dose. For example: if you normally take four (4) pills per day, you will be asked to drop this dose to three (3) pills per day for seven (7) days, then to two (2) pills per day for seven (7) days, then to one (1) per day for seven (7) days, and at the end of those last seven (7) days, this is when the "Drug Holiday" would start.   Will I have withdrawals? By doing a "slow downward taper" like this one, it  is unlikely that you will experience any significant withdrawal symptoms. Typically, what triggers withdrawals is the sudden stop of a high dose opioid therapy. Withdrawals can usually be avoided by slowly decreasing the dose over a prolonged period of time. If you do not follow these instructions and decide to stop your medication abruptly, withdrawals may be possible.  What are withdrawals? Withdrawals: refers to the wide range of symptoms that occur after stopping or dramatically reducing opiate drugs after heavy and prolonged use. Withdrawal symptoms do not occur to patients that use low dose opioids, or those who take the medication sporadically. Contrary to benzodiazepine (example: Valium, Xanax, etc.) or alcohol withdrawals ("Delirium Tremens"), opioid withdrawals are not lethal. Withdrawals are the physical manifestation of the body getting rid of the excess receptors.  Expected Symptoms Early symptoms of withdrawal may include: Agitation Anxiety Muscle aches Increased tearing Insomnia Runny nose Sweating Yawning  Late symptoms of withdrawal may include: Abdominal cramping Diarrhea Dilated pupils Goose bumps Nausea Vomiting  Will I experience withdrawals? Due to the slow nature of the taper, it is very unlikely that you will experience any.  What is a slow taper? Taper: refers to the gradual decrease in dose.  (Last update: 12/26/2019) ____________________________________________________________________________________________    

## 2020-11-24 NOTE — Telephone Encounter (Signed)
Called the pt. And let him know that I asked for help about  possible to start on elquis vs staying on the lovenox. Bethena Roys states that eliquis is not on his preferred list. He could take xarelto and it would be 42.00  and he can't have that med because he had a dvt on that  med. He will need to continue the lovenox. syv

## 2020-11-24 NOTE — Progress Notes (Signed)
Nursing Pain Medication Assessment:  Safety precautions to be maintained throughout the outpatient stay will include: orient to surroundings, keep bed in low position, maintain call bell within reach at all times, provide assistance with transfer out of bed and ambulation.  Medication Inspection Compliance: Pill count conducted under aseptic conditions, in front of the patient. Neither the pills nor the bottle was removed from the patient's sight at any time. Once count was completed pills were immediately returned to the patient in their original bottle.  Medication: Oxycodone IR Pill/Patch Count:  21 of 90 pills remain Pill/Patch Appearance: Markings consistent with prescribed medication Bottle Appearance: Standard pharmacy container. Clearly labeled. Filled Date: 05 / 26 / 2022 Last Medication intake:  Today

## 2020-12-02 LAB — TOXASSURE SELECT 13 (MW), URINE

## 2021-01-24 ENCOUNTER — Other Ambulatory Visit: Payer: Self-pay | Admitting: Nurse Practitioner

## 2021-01-24 DIAGNOSIS — E782 Mixed hyperlipidemia: Secondary | ICD-10-CM

## 2021-01-24 NOTE — Telephone Encounter (Signed)
Requested Prescriptions  Pending Prescriptions Disp Refills  . atorvastatin (LIPITOR) 80 MG tablet [Pharmacy Med Name: ATORVASTATIN 80 MG TABLET] 90 tablet 1    Sig: TAKE 1 TABLET BY MOUTH EVERY DAY     Cardiovascular:  Antilipid - Statins Failed - 01/24/2021 11:44 AM      Failed - LDL in normal range and within 360 days    LDL Chol Calc (NIH)  Date Value Ref Range Status  07/30/2020 108 (H) 0 - 99 mg/dL Final         Failed - Triglycerides in normal range and within 360 days    Triglycerides  Date Value Ref Range Status  07/30/2020 201 (H) 0 - 149 mg/dL Final         Passed - Total Cholesterol in normal range and within 360 days    Cholesterol, Total  Date Value Ref Range Status  07/30/2020 193 100 - 199 mg/dL Final         Passed - HDL in normal range and within 360 days    HDL  Date Value Ref Range Status  07/30/2020 50 >39 mg/dL Final         Passed - Patient is not pregnant      Passed - Valid encounter within last 12 months    Recent Outpatient Visits          4 months ago Granger, Karen, NP   5 months ago Mixed hyperlipidemia   [facility] Jon Billings, NP   1 year ago IFG (impaired fasting glucose)   [facility] Volney American, [place]   1 year ago [place], [place], [place]   1 year ago Acute left-sided low back pain with left-sided sciatica   [facility] Venita Lick, NP      Future Appointments            In 3 days Jon Billings, NP [facility], [place]

## 2021-01-27 ENCOUNTER — Other Ambulatory Visit: Payer: Self-pay

## 2021-01-27 ENCOUNTER — Ambulatory Visit (INDEPENDENT_AMBULATORY_CARE_PROVIDER_SITE_OTHER): Payer: 59 | Admitting: Nurse Practitioner

## 2021-01-27 ENCOUNTER — Encounter: Payer: Self-pay | Admitting: Nurse Practitioner

## 2021-01-27 VITALS — BP 120/65 | HR 66 | Temp 98.3°F | Ht 72.0 in | Wt 258.1 lb

## 2021-01-27 DIAGNOSIS — Z7901 Long term (current) use of anticoagulants: Secondary | ICD-10-CM | POA: Diagnosis not present

## 2021-01-27 DIAGNOSIS — R7301 Impaired fasting glucose: Secondary | ICD-10-CM

## 2021-01-27 DIAGNOSIS — R972 Elevated prostate specific antigen [PSA]: Secondary | ICD-10-CM | POA: Diagnosis not present

## 2021-01-27 DIAGNOSIS — E782 Mixed hyperlipidemia: Secondary | ICD-10-CM

## 2021-01-27 NOTE — Progress Notes (Signed)
BP 120/65   Pulse 66   Temp 98.3 F (36.8 C)   Ht 6' (1.829 m)   Wt 258 lb 2 oz (117.1 kg)   SpO2 97%   BMI 35.01 kg/m    Subjective:    Patient ID: Justin [name], male    DOB: Oct 21, 1965, 55 y.o.   MRN: [id number]  HPI: Justin [name] is a 55 y.o. male  Chief Complaint  Patient presents with   Annual Exam   HYPERLIPIDEMIA Hyperlipidemia status: excellent compliance Satisfied with current treatment?  yes Side effects:  no Medication compliance: excellent compliance Past cholesterol meds: atorvastain (lipitor) Supplements: none Aspirin:  no The 10-year ASCVD risk score Justin [name]., et al., 2013) is: 5.9%   Values used to calculate the score:     Age: 40 years     Sex: Male     Is Non-Hispanic African American: Yes     Diabetic: No     Tobacco smoker: No     Systolic Blood Pressure: 539 mmHg     Is BP treated: No     HDL Cholesterol: 50 mg/dL     Total Cholesterol: 193 mg/dL Chest pain:  no Coronary artery disease:  no Family history CAD:  no Family history early CAD:  no   Denies HA, CP, dizziness, palpitations, visual changes, and lower extremity swelling.  Some SOB when he tries to exercise.  Relevant past medical, surgical, family and social history reviewed and updated as indicated. Interim medical history since our last visit reviewed. Allergies and medications reviewed and updated.  Review of Systems  Eyes:  Negative for visual disturbance.  Respiratory:  Positive for shortness of breath. Negative for chest tightness.   Cardiovascular:  Negative for chest pain, palpitations and leg swelling.  Neurological:  Negative for dizziness, light-headedness and headaches.   Per HPI unless specifically indicated above     Objective:    BP 120/65   Pulse 66   Temp 98.3 F (36.8 C)   Ht 6' (1.829 m)   Wt 258 lb 2 oz (117.1 kg)   SpO2 97%   BMI 35.01 kg/m   Wt Readings from Last 3 Encounters:  01/27/21 258 lb 2 oz (117.1 kg)  11/24/20 253 lb  (114.8 kg)  11/19/20 254 lb 10.1 oz (115.5 kg)    Physical Exam Vitals and nursing note reviewed.  Constitutional:      General: He is not in acute distress.    Appearance: Normal appearance. He is not ill-appearing, toxic-appearing or diaphoretic.  HENT:     Head: Normocephalic.     Right Ear: External ear normal.     Left Ear: External ear normal.     Nose: Nose normal. No congestion or rhinorrhea.     Mouth/Throat:     Mouth: Mucous membranes are moist.  Eyes:     General:        Right eye: No discharge.        Left eye: No discharge.     Extraocular Movements: Extraocular movements intact.     Conjunctiva/sclera: Conjunctivae normal.     Pupils: Pupils are equal, round, and reactive to light.  Cardiovascular:     Rate and Rhythm: Normal rate and regular rhythm.     Heart sounds: No murmur heard. Pulmonary:     Effort: Pulmonary effort is normal. No respiratory distress.     Breath sounds: Examination of the right-lower field reveals decreased breath sounds. Examination of the  left-lower field reveals decreased breath sounds. Decreased breath sounds present. No wheezing, rhonchi or rales.  Abdominal:     General: Abdomen is flat. Bowel sounds are normal.  Musculoskeletal:     Cervical back: Normal range of motion and neck supple.  Skin:    General: Skin is warm and dry.     Capillary Refill: Capillary refill takes less than 2 seconds.  Neurological:     General: No focal deficit present.     Mental Status: He is alert and oriented to person, place, and time.  Psychiatric:        Mood and Affect: Mood normal.        Behavior: Behavior normal.        Thought Content: Thought content normal.        Judgment: Judgment normal.    Results for orders placed or performed in visit on 11/24/20  ToxASSURE Select 13 (MW), Urine  Result Value Ref Range   Summary Note       Assessment & Plan:   Problem List Items Addressed This Visit       Endocrine   IFG (impaired  fasting glucose)    Chronic. Controlled. Labs ordered today. Will make recommendations based on lab results.       Relevant Orders   Comp Met (CMET)   HgB A1c     Other   Long term current use of anticoagulant (Lovenox) (Chronic)    Continues to follow up with Oncology. Has not been able to switch to oral therapy due to increased clotting or insurance not covering the appropriate options.  Continue per their recommendations.       Mixed hyperlipidemia - Primary    Chronic.  Controlled.  Continue with current medication regimen of Justin [name] daily.  Labs ordered today.  Return to clinic in 6 months for reevaluation.  Call sooner if concerns arise.        Relevant Orders   Comp Met (CMET)   Lipid Profile   Other Visit Diagnoses     Elevated PSA       Will draw PSA at visit today. Will make recommendations based on lab results.    Relevant Orders   PSA        Follow up plan: Return in about 6 months (around 07/30/2021) for HTN, HLD, DM2 FU.

## 2021-01-27 NOTE — Assessment & Plan Note (Signed)
Chronic. Controlled. Labs ordered today. Will make recommendations based on lab results.   

## 2021-01-27 NOTE — Assessment & Plan Note (Signed)
Chronic.  Controlled.  Continue with current medication regimen of Atrovastatin '80mg'$  daily.  Labs ordered today.  Return to clinic in 6 months for reevaluation.  Call sooner if concerns arise.

## 2021-01-27 NOTE — Assessment & Plan Note (Signed)
Continues to follow up with Oncology. Has not been able to switch to oral therapy due to increased clotting or insurance not covering the appropriate options.  Continue per their recommendations.

## 2021-01-28 LAB — COMPREHENSIVE METABOLIC PANEL
ALT: 21 IU/L (ref 0–44)
AST: 12 IU/L (ref 0–40)
Albumin/Globulin Ratio: 1.5 (ref 1.2–2.2)
Albumin: 4 g/dL (ref 3.8–4.9)
Alkaline Phosphatase: 84 IU/L (ref 44–121)
BUN/Creatinine Ratio: 11 (ref 9–20)
BUN: 12 mg/dL (ref 6–24)
Bilirubin Total: 0.3 mg/dL (ref 0.0–1.2)
CO2: 21 mmol/L (ref 20–29)
Calcium: 9.2 mg/dL (ref 8.7–10.2)
Chloride: 104 mmol/L (ref 96–106)
Creatinine, Ser: 1.13 mg/dL (ref 0.76–1.27)
Globulin, Total: 2.6 g/dL (ref 1.5–4.5)
Glucose: 109 mg/dL — ABNORMAL HIGH (ref 65–99)
Potassium: 4.2 mmol/L (ref 3.5–5.2)
Sodium: 138 mmol/L (ref 134–144)
Total Protein: 6.6 g/dL (ref 6.0–8.5)
eGFR: 77 mL/min/{1.73_m2} (ref >59)

## 2021-01-28 LAB — HEMOGLOBIN A1C
Est. average glucose Bld gHb Est-mCnc: 123 mg/dL
Hgb A1c MFr Bld: 5.9 % — ABNORMAL HIGH (ref 4.8–5.6)

## 2021-01-28 LAB — LIPID PANEL
Chol/HDL Ratio: 3.2 ratio (ref 0.0–5.0)
Cholesterol, Total: 192 mg/dL (ref 100–199)
HDL: 60 mg/dL (ref >39)
LDL Chol Calc (NIH): 106 mg/dL — ABNORMAL HIGH (ref 0–99)
Triglycerides: 151 mg/dL — ABNORMAL HIGH (ref 0–149)
VLDL Cholesterol Cal: 26 mg/dL (ref 5–40)

## 2021-01-28 LAB — PSA: Prostate Specific Ag, Serum: 0.7 ng/mL (ref 0.0–4.0)

## 2021-01-28 NOTE — Progress Notes (Signed)
Hi Justin [name].  Your lab work looks good.  Kidney function has improved from prior which is great news. Cholesterol has also improved from prior. Your A1c remains in prediabetic range. And PSA is normal.  Please let me know if you have any questions.

## 2021-01-30 ENCOUNTER — Other Ambulatory Visit: Payer: Self-pay

## 2021-01-30 ENCOUNTER — Other Ambulatory Visit (HOSPITAL_COMMUNITY): Payer: Self-pay

## 2021-01-30 NOTE — Telephone Encounter (Signed)
Received a refill request from [facility] in regards to pts LOVENOX inj. Per chart review the pt no longer wanted to be on injections and preferred to start Eliquis. Spoke to [place] today, she checked pts insurance information and co-pay would be $42 for pt. However, because pt has Pharmacist, community we can provide a $10 co-pay card for Eliquis. I spoke to pt and informed of him info and he will come pick up co-pay card on Monday from downstairs. Will send Dr. Janese Banks rx to approve.

## 2021-01-31 MED ORDER — APIXABAN 5 MG PO TABS: 5.0000 mg | ORAL_TABLET | Freq: Two times a day (BID) | ORAL | 0 refills | Status: DC

## 2021-02-04 ENCOUNTER — Other Ambulatory Visit: Payer: Self-pay | Admitting: *Deleted

## 2021-02-04 MED ORDER — ENOXAPARIN SODIUM 150 MG/ML IJ SOSY: PREFILLED_SYRINGE | INTRAMUSCULAR | 1 refills | Status: DC

## 2021-02-16 ENCOUNTER — Telehealth: Payer: Self-pay

## 2021-02-16 ENCOUNTER — Other Ambulatory Visit: Payer: Self-pay

## 2021-02-16 MED ORDER — APIXABAN 5 MG PO TABS: 5.0000 mg | ORAL_TABLET | Freq: Two times a day (BID) | ORAL | 0 refills | Status: DC

## 2021-02-16 NOTE — Telephone Encounter (Signed)
Pt called back, I explained to pick up the co-pay card before picking medication up for his $10 co-pay. [facility] is [facility] at [facility]. Pt is aware and informed.

## 2021-02-16 NOTE — Telephone Encounter (Signed)
Spoke to [place] from [employer]: explained pt is needing Eliquis due to prior symptoms with other blood thinners. Pt is covered for a 90-day supply per rep. Stated that medication will reject at the pharmacy when picking up, but pt has to call in while at pharmacy and they will take care of the rest. Co-pay card is available for pt as well.

## 2021-02-16 NOTE — Telephone Encounter (Signed)
Left VM for pt informing of "approval for eliquis for a 90-day supply". Stated if any question so concerns he can return our call provided a call back phone number.

## 2021-02-21 ENCOUNTER — Ambulatory Visit (INDEPENDENT_AMBULATORY_CARE_PROVIDER_SITE_OTHER): Payer: 59

## 2021-02-21 DIAGNOSIS — Z0000 Encounter for general adult medical examination without abnormal findings: Secondary | ICD-10-CM | POA: Diagnosis not present

## 2021-02-21 NOTE — Patient Instructions (Signed)
Health Maintenance, Male Adopting a healthy lifestyle and getting preventive care are important in promoting health and wellness. Ask your health care provider about: The right schedule for you to have regular tests and exams. Things you can do on your own to prevent diseases and keep yourself healthy. What should I know about diet, weight, and exercise? Eat a healthy diet  Eat a diet that includes plenty of vegetables, fruits, low-fat dairy products, and lean protein. Do not eat a lot of foods that are high in solid fats, added sugars, or sodium. Maintain a healthy weight Body mass index (BMI) is a measurement that can be used to identify possible weight problems. It estimates body fat based on height and weight. Your health care provider can help determine your BMI and help you achieve or maintain a healthy weight. Get regular exercise Get regular exercise. This is one of the most important things you can do for your health. Most adults should: Exercise for at least 150 minutes each week. The exercise should increase your heart rate and make you sweat (moderate-intensity exercise). Do strengthening exercises at least twice a week. This is in addition to the moderate-intensity exercise. Spend less time sitting. Even light physical activity can be beneficial. Watch cholesterol and blood lipids Have your blood tested for lipids and cholesterol at 56 years of age, then have this test every 5 years. You may need to have your cholesterol levels checked more often if: Your lipid or cholesterol levels are high. You are older than 56 years of age. You are at high risk for heart disease. What should I know about cancer screening? Many types of cancers can be detected early and may often be prevented. Depending on your health history and family history, you may need to have cancer screening at various ages. This may include screening for: Colorectal cancer. Prostate cancer. Skin cancer. Lung  cancer. What should I know about heart disease, diabetes, and high blood pressure? Blood pressure and heart disease High blood pressure causes heart disease and increases the risk of stroke. This is more likely to develop in people who have high blood pressure readings, are of African descent, or are overweight. Talk with your health care provider about your target blood pressure readings. Have your blood pressure checked: Every 3-5 years if you are 18-39 years of age. Every year if you are 40 years old or older. If you are between the ages of 65 and 75 and are a current or former smoker, ask your health care provider if you should have a one-time screening for abdominal aortic aneurysm (AAA). Diabetes Have regular diabetes screenings. This checks your fasting blood sugar level. Have the screening done: Once every three years after age 45 if you are at a normal weight and have a low risk for diabetes. More often and at a younger age if you are overweight or have a high risk for diabetes. What should I know about preventing infection? Hepatitis B If you have a higher risk for hepatitis B, you should be screened for this virus. Talk with your health care provider to find out if you are at risk for hepatitis B infection. Hepatitis C Blood testing is recommended for: Everyone born from 1945 through 1965. Anyone with known risk factors for hepatitis C. Sexually transmitted infections (STIs) You should be screened each year for STIs, including gonorrhea and chlamydia, if: You are sexually active and are younger than 56 years of age. You are older than 56 years   of age and your health care provider tells you that you are at risk for this type of infection. Your sexual activity has changed since you were last screened, and you are at increased risk for chlamydia or gonorrhea. Ask your health care provider if you are at risk. Ask your health care provider about whether you are at high risk for HIV.  Your health care provider may recommend a prescription medicine to help prevent HIV infection. If you choose to take medicine to prevent HIV, you should first get tested for HIV. You should then be tested every 3 months for as long as you are taking the medicine. Follow these instructions at home: Lifestyle Do not use any products that contain nicotine or tobacco, such as cigarettes, e-cigarettes, and chewing tobacco. If you need help quitting, ask your health care provider. Do not use street drugs. Do not share needles. Ask your health care provider for help if you need support or information about quitting drugs. Alcohol use Do not drink alcohol if your health care provider tells you not to drink. If you drink alcohol: Limit how much you have to 0-2 drinks a day. Be aware of how much alcohol is in your drink. In the [place] one drink equals one 12 oz bottle of beer (355 mL), one 5 oz glass of wine (148 mL), or one 1 oz glass of hard liquor (44 mL). General instructions Schedule regular health, dental, and eye exams. Stay current with your vaccines. Tell your health care provider if: You often feel depressed. You have ever been abused or do not feel safe at home. Summary Adopting a healthy lifestyle and getting preventive care are important in promoting health and wellness. Follow your health care provider's instructions about healthy diet, exercising, and getting tested or screened for diseases. Follow your health care provider's instructions on monitoring your cholesterol and blood pressure. This information is not intended to replace advice given to you by your health care provider. Make sure you discuss any questions you have with your health care provider. Document Revised: 08/01/2020 Document Reviewed: 05/17/2018 Elsevier Patient Education  2022 [employer]  

## 2021-02-21 NOTE — Progress Notes (Signed)
Subjective:   Justin [name] is a 55 y.o. male who presents for Medicare Annual/Subsequent preventive examination.  I connected with  Justin [name] on 02/21/21 by an audio only telemedicine application and verified that I am speaking with the correct person using two identifiers.   I discussed the limitations, risks, security and privacy concerns of performing an evaluation and management service by telephone and the availability of in person appointments. I also discussed with the patient that there may be a patient responsible charge related to this service. The patient expressed understanding and verbally consented to this telephonic visit.  Location of Patient: home Location of Provider: office  List any persons and their role that are participating in the visit with the patient.    Review of Systems    Defer to PCP Cardiac Risk Factors include: none     Objective:    There were no vitals filed for this visit. There is no height or weight on file to calculate BMI.  Advanced Directives 02/21/2021 07/28/2020 12/31/2019 10/10/2019 08/20/2019 11/14/2018 07/14/2018  Does Patient Have a Medical Advance Directive? No No No No No No No  Would patient like information on creating a medical advance directive? No - Patient declined - - - No - Patient declined No - Patient declined No - Patient declined    Current Medications (verified) Outpatient Encounter Medications as of 02/21/2021  Medication Sig   acetaminophen (TYLENOL) 500 MG tablet Take 1,000 mg by mouth every 6 (six) hours as needed.   albuterol (VENTOLIN HFA) 108 (90 Base) MCG/ACT inhaler TAKE 2 PUFFS BY MOUTH EVERY 6 HOURS AS NEEDED FOR WHEEZE OR SHORTNESS OF BREATH   apixaban (ELIQUIS) 5 MG TABS tablet Take 1 tablet (5 mg total) by mouth 2 (two) times daily.   atorvastatin (LIPITOR) 80 MG tablet TAKE 1 TABLET BY MOUTH EVERY DAY   oxyCODONE (OXY IR/ROXICODONE) 5 MG immediate release tablet Take 1 tablet (5 mg total) by mouth  every 8 (eight) hours as needed for severe pain. Must last 30 days   Calcium Carbonate-Vit D-Min (GNP CALCIUM 1200) 1200-1000 MG-UNIT CHEW Chew 1,200 mg by mouth daily with breakfast. Take in combination with vitamin D and magnesium.   enoxaparin (LOVENOX) 150 MG/ML injection Inject 0.74 ml (110 mg total) into skin twice a day (Patient not taking: Reported on 02/21/2021)   oxyCODONE (OXY IR/ROXICODONE) 5 MG immediate release tablet Take 1 tablet (5 mg total) by mouth every 8 (eight) hours as needed for severe pain. Must last 30 days   oxyCODONE (OXY IR/ROXICODONE) 5 MG immediate release tablet Take 1 tablet (5 mg total) by mouth every 8 (eight) hours as needed for severe pain. Must last 30 days   No facility-administered encounter medications on file as of 02/21/2021.    Allergies (verified) Patient has no known allergies.   History: Past Medical History:  Diagnosis Date   Arm vein blood clot    right   Coronary artery disease    DVT (deep venous thrombosis) ([place]) 2012; January 2016   History of right arm; now right lower shotty   GERD (gastroesophageal reflux disease)    Rib pain on right side 08/04/2016   RT rib pain that radiates around to the back. Level 8 of 10.  Constant. Started 1 mo ago, 1 week after DVT.   Vascular thoracic outlet syndrome    Right    Past Surgical History:  Procedure Laterality Date   CAROTID ANGIOGRAM  COLONOSCOPY WITH PROPOFOL N/A 10/10/2019   Procedure: COLONOSCOPY WITH PROPOFOL;  Surgeon: Jonathon Bellows, MD;  Location: [facility] ENDOSCOPY;  Service: Gastroenterology;  Laterality: N/A;   KNEE CARTILAGE SURGERY     Performed for cracked patella and torn meniscus   Right upper extremity thrombectomy  2012   SCALENOTOMY W/O RESECTION CERVICAL RIB  2012   4 right-sided thoracic outlet syndrome   Family History  Problem Relation Age of Onset   Heart disease Father    Breast cancer Father 30   Diabetes Father    Heart attack Father    Cancer Maternal  Grandmother    Leukemia Maternal Grandfather    Prostate cancer Maternal Uncle    Social History   Socioeconomic History   Marital status: Married    Spouse name: Not on file   Number of children: Not on file   Years of education: Not on file   Highest education level: Not on file  Occupational History    Comment: [employer]  Tobacco Use   Smoking status: Former    Packs/day: 0.50    Types: Cigarettes   Smokeless tobacco: Never   Tobacco comments:    quit smoking in 2010  Vaping Use   Vaping Use: Never used  Substance and Sexual Activity   Alcohol use: No   Drug use: No   Sexual activity: Yes    Birth control/protection: None  Other Topics Concern   Not on file  Social History Narrative   He works full-time at [employer].    He is married. Nonsmoker who is not currently. Alcohol.   Social Determinants of Health   Financial Resource Strain: Low Risk    Difficulty of Paying Living Expenses: Not very hard  Food Insecurity: No Food Insecurity   Worried About Charity fundraiser in the Last Year: Never true   Ran Out of Food in the Last Year: Never true  Transportation Needs: No Transportation Needs   Lack of Transportation (Medical): No   Lack of Transportation (Non-Medical): No  Physical Activity: Inactive   Days of Exercise per Week: 0 days   Minutes of Exercise per Session: 0 min  Stress: No Stress Concern Present   Feeling of Stress : Not at all  Social Connections: Moderately Isolated   Frequency of Communication with Friends and Family: Once a week   Frequency of Social Gatherings with Friends and Family: Never   Attends Religious Services: More than 4 times per year   Active Member of [employer] or Organizations: No   Attends Archivist Meetings: Never   Marital Status: Married    Tobacco Counseling Counseling given: Not Answered Tobacco comments: quit smoking in 2010   Clinical Intake:  Pre-visit preparation completed: Yes  Pain : No/denies pain      Nutritional Risks: None Diabetes: No  What is the last grade level you completed in school?: 12th grade  Diabetic?No  Interpreter Needed?: No      Activities of Daily Living In your present state of health, do you have any difficulty performing the following activities: 02/21/2021 01/27/2021  Hearing? N N  Vision? N N  Difficulty concentrating or making decisions? N N  Walking or climbing stairs? N Y  Comment - Short of breath  Dressing or bathing? N N  Doing errands, shopping? N N  Preparing Food and eating ? N -  Using the Toilet? N -  In the past six months, have you accidently leaked urine? N -  Do you have problems with loss of bowel control? N -  Managing your Medications? N -  Managing your Finances? N -  Housekeeping or managing your Housekeeping? Y -  Some recent data might be hidden    Patient Care Team: Jon Billings, NP as PCP - General Schnier, Dolores Lory, MD (Vascular Surgery)  Indicate any recent Medical Services you may have received from other than [facility] providers in the past year (date may be approximate).     Assessment:   This is a routine wellness examination for Ar[place].  Hearing/Vision screen No results found.  Dietary issues and exercise activities discussed: Current Exercise Habits: The patient does not participate in regular exercise at present, Exercise limited by: None identified   Goals Addressed   None   Depression Screen PHQ 2/9 Scores 02/21/2021 07/28/2020 01/23/2020 12/31/2019 12/21/2018 02/02/2018 12/29/2017  PHQ - 2 Score 0 0 0 0 0 0 0  PHQ- 9 Score - - 2 - - - -  Exception Documentation - - - Patient refusal - - -    Fall Risk Fall Risk  02/21/2021 11/24/2020 10/29/2020 07/28/2020 03/24/2020  Falls in the past year? 0 0 0 0 0  Number falls in past yr: 0 - 0 - -  Injury with Fall? 0 - 0 - -  Risk for fall due to : No Fall Risks No Fall Risks - - No Fall Risks  Follow up Falls evaluation completed Falls evaluation completed - -  Falls evaluation completed    FALL RISK PREVENTION PERTAINING TO THE HOME:  Any stairs in or around the home? Yes  If so, are there any without handrails? Yes  Home free of loose throw rugs in walkways, pet beds, electrical cords, etc? Yes  Adequate lighting in your home to reduce risk of falls? Yes   ASSISTIVE DEVICES UTILIZED TO PREVENT FALLS:  Life alert? No  Use of a cane, walker or w/c? No  Grab bars in the bathroom? No  Shower chair or bench in shower? No  Elevated toilet seat or a handicapped toilet? No   TIMED UP AND GO:  Was the test performed?  N/A .  Length of time to ambulate 10 feet: N/A sec.     Cognitive Function:     6CIT Screen 02/21/2021  What Year? 0 points  What month? 0 points  What time? 0 points  Count back from 20 0 points  Months in reverse 0 points  Repeat phrase 0 points  Total Score 0    Immunizations Immunization History  Administered Date(s) Administered   PFIZER(Purple Top)SARS-COV-2 Vaccination 09/30/2019, 10/21/2019   Tdap 09/17/2014   Zoster Recombinat (Shingrix) 01/02/2019    TDAP status: Up to date  Flu Vaccine status: Due, Education has been provided regarding the importance of this vaccine. Advised may receive this vaccine at local pharmacy or Health Dept. Aware to provide a copy of the vaccination record if obtained from local pharmacy or Health Dept. Verbalized acceptance and understanding.  Pneumococcal vaccine status: Declined,  Education has been provided regarding the importance of this vaccine but patient still declined. Advised may receive this vaccine at local pharmacy or Health Dept. Aware to provide a copy of the vaccination record if obtained from local pharmacy or Health Dept. Verbalized acceptance and understanding.   Covid-19 vaccine status: Information provided on how to obtain vaccines.   Qualifies for Shingles Vaccine? Yes   Zostavax completed No   Shingrix Completed?: No.    Education has  been provided  regarding the importance of this vaccine. Patient has been advised to call insurance company to determine out of pocket expense if they have not yet received this vaccine. Advised may also receive vaccine at local pharmacy or Health Dept. Verbalized acceptance and understanding.  Screening Tests Health Maintenance  Topic Date Due   COVID-19 Vaccine (3 - Pfizer risk series) 11/18/2019   INFLUENZA VACCINE  Never done   Zoster Vaccines- Shingrix (2 of 2) 04/29/2021 (Originally 02/27/2019)   TETANUS/TDAP  09/16/2024   COLONOSCOPY (Pts 45-41yr Insurance coverage will need to be confirmed)  10/09/2029   Hepatitis C Screening  Completed   HIV Screening  Completed   HPV VACCINES  Aged Out    Health Maintenance  Health Maintenance Due  Topic Date Due   COVID-19 Vaccine (3 - Pfizer risk series) 11/18/2019   INFLUENZA VACCINE  Never done    Colorectal cancer screening: Type of screening: Colonoscopy. Completed 10/10/19. Repeat every 10 years  Lung Cancer Screening: (Low Dose CT Chest recommended if Age 56-80years, 30 pack-year currently smoking OR have quit w/in 15years.) does not qualify.   Lung Cancer Screening Referral: No  Additional Screening:  Hepatitis C Screening: does qualify; Completed 01/23/20  Vision Screening: Recommended annual ophthalmology exams for early detection of glaucoma and other disorders of the eye. Is the patient up to date with their annual eye exam?  No  Who is the provider or what is the name of the office in which the patient attends annual eye exams? N/A If pt is not established with a provider, would they like to be referred to a provider to establish care? No .   Dental Screening: Recommended annual dental exams for proper oral hygiene  Community Resource Referral / Chronic Care Management: CRR required this visit?  No   CCM required this visit?  No      Plan:     I have personally reviewed and noted the following in the patient's chart:    Medical and social history Use of alcohol, tobacco or illicit drugs  Current medications and supplements including opioid prescriptions. Patient is currently taking opioid prescriptions. Information provided to patient regarding non-opioid alternatives. Patient advised to discuss non-opioid treatment plan with their provider. Functional ability and status Nutritional status Physical activity Advanced directives List of other physicians Hospitalizations, surgeries, and ER visits in previous 12 months Vitals Screenings to include cognitive, depression, and falls Referrals and appointments  In addition, I have reviewed and discussed with patient certain preventive protocols, quality metrics, and best practice recommendations. A written personalized care plan for preventive services as well as general preventive health recommendations were provided to patient.   Mr. CPaladino, Thank you for taking time to come for your Medicare Wellness Visit. I appreciate your ongoing commitment to your health goals. Please review the following plan we discussed and let me know if I can assist you in the future.   These are the goals we discussed:  Goals   None     This is a list of the screening recommended for you and due dates:  Health Maintenance  Topic Date Due   COVID-19 Vaccine (3 - Pfizer risk series) 11/18/2019   Zoster (Shingles) Vaccine (2 of 2) 04/29/2021*   Flu Shot  09/04/2021*   Tetanus Vaccine  09/16/2024   Colon Cancer Screening  10/09/2029   Hepatitis C Screening: USPSTF Recommendation to screen - Ages 18-79 yo.  Completed   HIV Screening  Completed  HPV Vaccine  Aged Out  *Topic was postponed. The date shown is not the original due date.      Georgina Peer, [place]   02/21/2021   Nurse Notes: Non Face to Face 60 minutes

## 2021-02-21 NOTE — Progress Notes (Signed)
PROVIDER NOTE: Information contained herein reflects review and annotations entered in association with encounter. Interpretation of such information and data should be left to medically-trained personnel. Information provided to patient can be located elsewhere in the medical record under "Patient Instructions". Document created using STT-dictation technology, any transcriptional errors that may result from process are unintentional.    Patient: Justin [name]  Service Category: E/M  Provider: Gaspar Cola, MD  DOB: Apr 04, 1966  DOS: 02/25/2021  Specialty: Interventional Pain Management  MRN: [id number]  Setting: Ambulatory outpatient  PCP: Jon Billings, NP  Type: Established Patient    Referring Provider: Jon Billings, NP  Location: Office  Delivery: Face-to-face     HPI  [name], a 55 y.o. year old male, is here today because of his Chronic right-sided thoracic back pain [M54.6, G89.29]. Mr. Okimoto primary complain today is Back Pain Last encounter: My last encounter with him was on 11/24/2020. Pertinent problems: 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 Pain Assessment: Severity of Chronic pain is reported as a 7 /10. Location: Back Right/pain starts under right around to back. Onset: More than a month ago. Quality: Aching, Constant, Stabbing. Timing: Constant. Modifying factor(s): medication. Vitals:  height is 6' (1.829 m) and weight is 258 lb (117 kg). His temperature is 97.4 F (36.3 C) (abnormal). His blood pressure is 139/81 and his pulse is 89. His respiration  is 16 and oxygen saturation is 99%.   Reason for encounter: medication management.   The patient indicates doing well with the current medication regimen. No adverse reactions or side effects reported to the medications.   RTCB: 05/28/2021 Nonopioids transferred 03/24/2020: Turmeric, Calcium  Pharmacotherapy Assessment  Analgesic: Oxycodone IR 5 mg, 1 tab PO q 8 hrs (15 mg/day of oxycodone)  MME/day: 22.5 mg/day.   Monitoring: [place] PMP: PDMP reviewed during this encounter.       Pharmacotherapy: No side-effects or adverse reactions reported. Compliance: No problems identified. Effectiveness: Clinically acceptable.  Ignatius Specking, RN  02/25/2021  2:00 PM  Sign when Signing Visit Nursing Pain Medication Assessment:  Safety precautions to be maintained throughout the outpatient stay will include: orient to surroundings, keep bed in low position, maintain call bell within reach at all times, provide assistance with transfer out of bed and ambulation.  Medication Inspection Compliance: Pill count conducted under aseptic conditions, in front of the patient. Neither the pills nor the bottle was removed from the patient's sight at any time. Once count was completed pills were immediately returned to the patient in their original bottle.  Medication: Oxycodone IR Pill/Patch Count:  23 of 90 pills remain Pill/Patch Appearance: Markings consistent with prescribed medication Bottle Appearance: Standard pharmacy container. Clearly labeled. Filled Date: 8/ 29 / 2022 Last Medication intake:  Today     UDS:  Summary  Date Value Ref Range Status  11/24/2020 Note  Final    Comment:    ==================================================================== [employer] (MW) ==================================================================== Test                             Result  Flag       Units  Drug Present and Declared for Prescription Verification   Oxycodone                       1264         EXPECTED   ng/mg creat   Oxymorphone                    377          EXPECTED   ng/mg creat   Noroxycodone                   979          EXPECTED   ng/mg creat   Noroxymorphone                 117          EXPECTED   ng/mg creat    Sources of oxycodone are scheduled prescription medications.    Oxymorphone, noroxycodone, and noroxymorphone are expected    metabolites of oxycodone. Oxymorphone is also available as a    scheduled prescription medication.  ==================================================================== Test                      Result    Flag   Units      Ref Range   Creatinine              168              mg/dL      >=20 ==================================================================== Declared Medications:  The flagging and interpretation on this report are based on the  following declared medications.  Unexpected results may arise from  inaccuracies in the declared medications.   **Note: The testing scope of this panel includes these medications:   Oxycodone (Roxicodone)   **Note: The testing scope of this panel does not include the  following reported medications:   Acetaminophen (Tylenol)  Albuterol (Ventolin HFA)  Atorvastatin (Lipitor)  Azithromycin (Zithromax)  Benzonatate (Tessalon)  Calcium  Cyclobenzaprine (Fexmid)  Enoxaparin (Lovenox)  Gabapentin (Neurontin)  Ibuprofen (Advil)  Vitamin D ==================================================================== For clinical consultation, please call [phone]. ====================================================================      ROS  Constitutional: Denies any fever or chills Gastrointestinal: No reported hemesis, hematochezia, vomiting, or acute GI distress Musculoskeletal: Denies any acute onset joint swelling, redness, loss of ROM, or weakness Neurological: No reported episodes of acute onset apraxia, aphasia, dysarthria, agnosia, amnesia, paralysis, loss of  coordination, or loss of consciousness  Medication Review  GNP Calcium 1200, acetaminophen, albuterol, apixaban, atorvastatin, and oxyCODONE  History Review  Allergy: Mr. Milstein has No Known Allergies. Drug: Mr. Neubert  reports no history of drug use. Alcohol:  reports no history of alcohol use. Tobacco:  reports that he has quit smoking. His smoking use included cigarettes. He smoked an average of .5 packs per day. He has never used smokeless tobacco. Social: Mr. Chew  reports that he has quit smoking. His smoking use included cigarettes. He smoked an average of .5 packs per day. He has never used smokeless tobacco. He reports that he does not drink alcohol and does not use drugs. Medical:  has a past medical history of Arm vein blood clot, Coronary artery disease, DVT (deep venous thrombosis) ([place]) (2012; January 2016), GERD (gastroesophageal reflux disease), Rib pain on right side (08/04/2016), and Vascular thoracic outlet syndrome. Surgical: Mr. Nasser  has a past surgical history that  includes Knee cartilage surgery; Carotid angiogram; Scalenotomy w/o resection cervical rib (2012); Right upper extremity thrombectomy (2012); and Colonoscopy with propofol (N/A, 10/10/2019). Family: family history includes Breast cancer (age of onset: 22) in his father; Cancer in his maternal grandmother; Diabetes in his father; Heart attack in his father; Heart disease in his father; Leukemia in his maternal grandfather; Prostate cancer in his maternal uncle.  Laboratory Chemistry Profile   Renal Lab Results  Component Value Date   BUN 12 01/27/2021   CREATININE 1.13 01/27/2021   BCR 11 01/27/2021   GFRAA >60 02/19/2020   GFRNONAA 60 (L) 11/19/2020    Hepatic Lab Results  Component Value Date   AST 12 01/27/2021   ALT 21 01/27/2021   ALBUMIN 4.0 01/27/2021   ALKPHOS 84 01/27/2021   LIPASE 90 03/02/2014    Electrolytes Lab Results  Component Value Date   NA 138 01/27/2021   K 4.2  01/27/2021   CL 104 01/27/2021   CALCIUM 9.2 01/27/2021   MG 2.0 07/28/2017    Bone Lab Results  Component Value Date   VD25OH 28.7 (L) 07/30/2020   25OHVITD1 15 (L) 06/29/2018   25OHVITD2 8.4 06/29/2018   25OHVITD3 6.8 06/29/2018    Inflammation (CRP: Acute Phase) (ESR: Chronic Phase) Lab Results  Component Value Date   CRP 12 (H) 06/29/2018   ESRSEDRATE 27 06/29/2018   LATICACIDVEN 0.9 07/20/2016         Note: Above Lab results reviewed.  Recent Imaging Review  US Venous Img Upper Uni Right(DVT) CLINICAL DATA:  55 year old male with a history of upper extremity thrombosis, new right arm swelling  EXAM: RIGHT UPPER EXTREMITY VENOUS DOPPLER ULTRASOUND  TECHNIQUE: Gray-scale sonography with graded compression, as well as color Doppler and duplex ultrasound were performed to evaluate the upper extremity deep venous system from the level of the subclavian vein and including the jugular, axillary, basilic, radial, ulnar and upper cephalic vein. Spectral Doppler was utilized to evaluate flow at rest and with distal augmentation maneuvers.  COMPARISON:  None.  FINDINGS: Contralateral Subclavian Vein: Respiratory phasicity is normal and symmetric with the symptomatic side. No evidence of thrombus. Normal compressibility.  Internal Jugular Vein: No evidence of thrombus. Normal compressibility, respiratory phasicity and response to augmentation.  Subclavian Vein: No evidence of thrombus. Normal compressibility, respiratory phasicity and response to augmentation.  Axillary Vein: No evidence of thrombus. Normal compressibility, respiratory phasicity and response to augmentation.  Cephalic Vein: No evidence of thrombus. Normal compressibility, respiratory phasicity and response to augmentation.  Basilic Vein: No evidence of thrombus. Normal compressibility, respiratory phasicity and response to augmentation.  Brachial Veins: No evidence of thrombus. Normal  compressibility, respiratory phasicity and response to augmentation.  Radial Veins: No evidence of thrombus. Normal compressibility, respiratory phasicity and response to augmentation.  Ulnar Veins: No evidence of thrombus. Normal compressibility, respiratory phasicity and response to augmentation.  Other Findings:  None visualized.  IMPRESSION: Sonographic survey of the right upper extremity negative for DVT  Electronically Signed   By: Corrie Mckusick D.O.   On: 01/25/2020 15:10 Note: Reviewed        Physical Exam  General appearance: Well nourished, well developed, and well hydrated. In no apparent acute distress Mental status: Alert, oriented x 3 (person, place, & time)       Respiratory: No evidence of acute respiratory distress Eyes: PERLA Vitals: BP 139/81   Pulse 89   Temp (!) 97.4 F (36.3 C)   Resp 16   Ht 6' (  1.829 m)   Wt 258 lb (117 kg)   SpO2 99%   BMI 34.99 kg/m  BMI: Estimated body mass index is 34.99 kg/m as calculated from the following:   Height as of this encounter: 6' (1.829 m).   Weight as of this encounter: 258 lb (117 kg). Ideal: Ideal body weight: 77.6 kg (171 lb 1.2 oz) Adjusted ideal body weight: 93.4 kg (205 lb 13.5 oz)  Assessment   Status Diagnosis  Controlled Controlled Controlled 1. Chronic thoracic back pain (1ry area of Pain) (Right)   2. Thoracic radiculitis (2ry area of Pain)   3. Right upper quadrant abdominal pain (3ry area of Pain)   4. Rib pain on right side (4th area of Pain)   5. 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 Intercostal neuralgia (T6, T7) (Right)   7. Chronic low back pain (Left) with sciatica (Left)   8. Chronic pain syndrome   9. Chronic thoracic back pain (Primary Area of Pain) (Right)   10. Right upper quadrant abdominal pain (Tertiary Area of Pain)   11. Pharmacologic therapy   12. Chronic use of opiate for therapeutic purpose   13. Encounter for chronic pain management   14.  Encounter for medication management      Updated Problems: No problems updated.   Plan of Care  Problem-specific:  No problem-specific Assessment & Plan notes found for this encounter.  Mr. MILT COYE has a current medication list which includes the following long-term medication(s): albuterol, apixaban, apixaban, atorvastatin, oxycodone, [START ON 03/29/2021] oxycodone, [START ON 04/28/2021] oxycodone, and gnp calcium 1200.  Pharmacotherapy (Medications Ordered): Meds ordered this encounter  Medications   oxyCODONE (OXY IR/ROXICODONE) 5 MG immediate release tablet    Sig: Take 1 tablet (5 mg total) by mouth every 8 (eight) hours as needed for severe pain. Must last 30 days    Dispense:  90 tablet    Refill:  0    Not a duplicate. Do NOT delete! Dispense 1 day early if closed on fill date. Warn: no CNS-depressants within 8 hrs of opioid. Do not send refill request. Renewal requires appointment. No partial fills allowed   oxyCODONE (OXY IR/ROXICODONE) 5 MG immediate release tablet    Sig: Take 1 tablet (5 mg total) by mouth every 8 (eight) hours as needed for severe pain. Must last 30 days    Dispense:  90 tablet    Refill:  0    Not a duplicate. Do NOT delete! Dispense 1 day early if closed on fill date. Warn: no CNS-depressants within 8 hrs of opioid. Do not send refill request. Renewal requires appointment. No partial fills allowed   oxyCODONE (OXY IR/ROXICODONE) 5 MG immediate release tablet    Sig: Take 1 tablet (5 mg total) by mouth every 8 (eight) hours as needed for severe pain. Must last 30 days    Dispense:  90 tablet    Refill:  0    Not a duplicate. Do NOT delete! Dispense 1 day early if closed on fill date. Warn: no CNS-depressants within 8 hrs of opioid. Do not send refill request. Renewal requires appointment. No partial fills allowed    Orders:  No orders of the defined types were placed in this encounter.  Follow-up plan:   Return in about 3 months (around  05/28/2021) for Eval-day (M,W), (F2F), (MM).     Interventional management options:  Considering:   Diagnostic right thoracic SNRB  Diagnostic right thoracic facet block  Possible right thoracic facet medial branch RFA    Palliative PRN treatment(s):   Palliative right T6, T7, T8, & T9 intercostal nerve block  Palliative right T6, T7, T8, & T9 intercostal nerve RFA #2 (Last done 12/29/17)    Recent Visits Date Type Provider Dept  02/25/21 Office Visit Milinda Pointer, MD [facility]  Showing recent visits within past 90 days and meeting all other requirements Future Appointments Date Type Provider Dept  05/25/21 Appointment Milinda Pointer, MD [facility]  Showing future appointments within next 90 days and meeting all other requirements I discussed the assessment and treatment plan with the patient. The patient was provided an opportunity to ask questions and all were answered. The patient agreed with the plan and demonstrated an understanding of the instructions.  Patient advised to call back or seek an in-person evaluation if the symptoms or condition worsens.  Duration of encounter: 30 minutes.  Note by: Gaspar Cola, MD Date: 02/25/2021; Time: 1:29 PM

## 2021-02-24 ENCOUNTER — Telehealth: Payer: Self-pay

## 2021-02-24 DIAGNOSIS — Z599 Problem related to housing and economic circumstances, unspecified: Secondary | ICD-10-CM

## 2021-02-24 NOTE — Telephone Encounter (Signed)
Copied from [place] [id number]. Topic: General - Other >> Feb 23, 2021  2:11 PM Pawlus, Brayton Layman A wrote: Reason for CRM: Pt requested a call back from [place] regarding his Medicare annual wellness exam, please advise.

## 2021-02-25 ENCOUNTER — Encounter: Payer: Self-pay | Admitting: Pain Medicine

## 2021-02-25 ENCOUNTER — Ambulatory Visit: Payer: 59 | Attending: Pain Medicine | Admitting: Pain Medicine

## 2021-02-25 ENCOUNTER — Other Ambulatory Visit: Payer: Self-pay

## 2021-02-25 VITALS — BP 139/81 | HR 89 | Temp 97.4°F | Resp 16 | Ht 72.0 in | Wt 258.0 lb

## 2021-02-25 DIAGNOSIS — M546 Pain in thoracic spine: Secondary | ICD-10-CM | POA: Diagnosis present

## 2021-02-25 DIAGNOSIS — M5442 Lumbago with sciatica, left side: Secondary | ICD-10-CM | POA: Insufficient documentation

## 2021-02-25 DIAGNOSIS — R0789 Other chest pain: Secondary | ICD-10-CM

## 2021-02-25 DIAGNOSIS — Z79899 Other long term (current) drug therapy: Secondary | ICD-10-CM | POA: Diagnosis present

## 2021-02-25 DIAGNOSIS — G8929 Other chronic pain: Secondary | ICD-10-CM

## 2021-02-25 DIAGNOSIS — M5414 Radiculopathy, thoracic region: Secondary | ICD-10-CM | POA: Diagnosis present

## 2021-02-25 DIAGNOSIS — G894 Chronic pain syndrome: Secondary | ICD-10-CM

## 2021-02-25 DIAGNOSIS — Z79891 Long term (current) use of opiate analgesic: Secondary | ICD-10-CM

## 2021-02-25 DIAGNOSIS — R0781 Pleurodynia: Secondary | ICD-10-CM | POA: Insufficient documentation

## 2021-02-25 DIAGNOSIS — G8912 Acute post-thoracotomy pain: Secondary | ICD-10-CM

## 2021-02-25 DIAGNOSIS — G588 Other specified mononeuropathies: Secondary | ICD-10-CM | POA: Diagnosis present

## 2021-02-25 DIAGNOSIS — R1011 Right upper quadrant pain: Secondary | ICD-10-CM | POA: Diagnosis present

## 2021-02-25 MED ORDER — OXYCODONE HCL 5 MG PO TABS: 5.0000 mg | ORAL_TABLET | Freq: Three times a day (TID) | ORAL | 0 refills | Status: DC | PRN

## 2021-02-25 NOTE — Progress Notes (Signed)
Nursing Pain Medication Assessment:  Safety precautions to be maintained throughout the outpatient stay will include: orient to surroundings, keep bed in low position, maintain call bell within reach at all times, provide assistance with transfer out of bed and ambulation.  Medication Inspection Compliance: Pill count conducted under aseptic conditions, in front of the patient. Neither the pills nor the bottle was removed from the patient's sight at any time. Once count was completed pills were immediately returned to the patient in their original bottle.  Medication: Oxycodone IR Pill/Patch Count:  23 of 90 pills remain Pill/Patch Appearance: Markings consistent with prescribed medication Bottle Appearance: Standard pharmacy container. Clearly labeled. Filled Date: 8/ 29 / 2022 Last Medication intake:  Today

## 2021-02-25 NOTE — Patient Instructions (Signed)
____________________________________________________________________________________________  Medication Rules  Purpose: To inform patients, and their family members, of our rules and regulations.  Applies to: All patients receiving prescriptions (written or electronic).  Pharmacy of record: Pharmacy where electronic prescriptions will be sent. If written prescriptions are taken to a different pharmacy, please inform the nursing staff. The pharmacy listed in the electronic medical record should be the one where you would like electronic prescriptions to be sent.  Electronic prescriptions: In compliance with the [place] Strengthen Opioid Misuse Prevention (STOP) Act of 2017 (Session Law 2017-74/H243), effective June 07, 2018, all controlled substances must be electronically prescribed. Calling prescriptions to the pharmacy will cease to exist.  Prescription refills: Only during scheduled appointments. Applies to all prescriptions.  NOTE: The following applies primarily to controlled substances (Opioid* Pain Medications).   Type of encounter (visit): For patients receiving controlled substances, face-to-face visits are required. (Not an option or up to the patient.)  Patient's responsibilities: Pain Pills: Bring all pain pills to every appointment (except for procedure appointments). Pill Bottles: Bring pills in original pharmacy bottle. Always bring the newest bottle. Bring bottle, even if empty. Medication refills: You are responsible for knowing and keeping track of what medications you take and those you need refilled. The day before your appointment: write a list of all prescriptions that need to be refilled. The day of the appointment: give the list to the admitting nurse. Prescriptions will be written only during appointments. No prescriptions will be written on procedure days. If you forget a medication: it will not be "Called in", "Faxed", or "electronically sent". You will  need to get another appointment to get these prescribed. No early refills. Do not call asking to have your prescription filled early. Prescription Accuracy: You are responsible for carefully inspecting your prescriptions before leaving our office. Have the discharge nurse carefully go over each prescription with you, before taking them home. Make sure that your name is accurately spelled, that your address is correct. Check the name and dose of your medication to make sure it is accurate. Check the number of pills, and the written instructions to make sure they are clear and accurate. Make sure that you are given enough medication to last until your next medication refill appointment. Taking Medication: Take medication as prescribed. When it comes to controlled substances, taking less pills or less frequently than prescribed is permitted and encouraged. Never take more pills than instructed. Never take medication more frequently than prescribed.  Inform other Doctors: Always inform, all of your healthcare providers, of all the medications you take. Pain Medication from other Providers: You are not allowed to accept any additional pain medication from any other Doctor or Healthcare provider. There are two exceptions to this rule. (see below) In the event that you require additional pain medication, you are responsible for notifying us, as stated below. Cough Medicine: Often these contain an opioid, such as codeine or hydrocodone. Never accept or take cough medicine containing these opioids if you are already taking an opioid* medication. The combination may cause respiratory failure and death. Medication Agreement: You are responsible for carefully reading and following our Medication Agreement. This must be signed before receiving any prescriptions from our practice. Safely store a copy of your signed Agreement. Violations to the Agreement will result in no further prescriptions. (Additional copies of our  Medication Agreement are available upon request.) Laws, Rules, & Regulations: All patients are expected to follow all Federal and State Laws, Statutes, Rules, & Regulations. Ignorance of   the Laws does not constitute a valid excuse.  Illegal drugs and Controlled Substances: The use of illegal substances (including, but not limited to marijuana and its derivatives) and/or the illegal use of any controlled substances is strictly prohibited. Violation of this rule may result in the immediate and permanent discontinuation of any and all prescriptions being written by our practice. The use of any illegal substances is prohibited. Adopted CDC guidelines & recommendations: Target dosing levels will be at or below 60 MME/day. Use of benzodiazepines** is not recommended.  Exceptions: There are only two exceptions to the rule of not receiving pain medications from other Healthcare Providers. Exception #1 (Emergencies): In the event of an emergency (i.e.: accident requiring emergency care), you are allowed to receive additional pain medication. However, you are responsible for: As soon as you are able, call our office [phone], at any time of the day or night, and leave a message stating your name, the date and nature of the emergency, and the name and dose of the medication prescribed. In the event that your call is answered by a member of our staff, make sure to document and save the date, time, and the name of the person that took your information.  Exception #2 (Planned Surgery): In the event that you are scheduled by another doctor or dentist to have any type of surgery or procedure, you are allowed (for a period no longer than 30 days), to receive additional pain medication, for the acute post-op pain. However, in this case, you are responsible for picking up a copy of our "Post-op Pain Management for Surgeons" handout, and giving it to your surgeon or dentist. This document is available at our office, and  does not require an appointment to obtain it. Simply go to our office during business hours (Monday-Thursday from 8:00 AM to 4:00 PM) (Friday 8:00 AM to 12:00 Noon) or if you have a scheduled appointment with us, prior to your surgery, and ask for it by name. In addition, you are responsible for: calling our office [phone], at any time of the day or night, and leaving a message stating your name, name of your surgeon, type of surgery, and date of procedure or surgery. Failure to comply with your responsibilities may result in termination of therapy involving the controlled substances.  *Opioid medications include: morphine, codeine, oxycodone, oxymorphone, hydrocodone, hydromorphone, meperidine, tramadol, tapentadol, buprenorphine, fentanyl, methadone. **Benzodiazepine medications include: diazepam (Valium), alprazolam (Xanax), clonazepam (Klonopine), lorazepam (Ativan), clorazepate (Tranxene), chlordiazepoxide (Librium), estazolam (Prosom), oxazepam (Serax), temazepam (Restoril), triazolam (Halcion) (Last updated: 05/05/2020) ____________________________________________________________________________________________  ____________________________________________________________________________________________  Medication Recommendations and Reminders  Applies to: All patients receiving prescriptions (written and/or electronic).  Medication Rules & Regulations: These rules and regulations exist for your safety and that of others. They are not flexible and neither are we. Dismissing or ignoring them will be considered "non-compliance" with medication therapy, resulting in complete and irreversible termination of such therapy. (See document titled "Medication Rules" for more details.) In all conscience, because of safety reasons, we cannot continue providing a therapy where the patient does not follow instructions.  Pharmacy of record:  Definition: This is the pharmacy where your electronic  prescriptions will be sent.  We do not endorse any particular pharmacy, however, we have experienced problems with [facility] not securing enough medication supply for the community. We do not restrict you in your choice of pharmacy. However, once we write for your prescriptions, we will NOT be re-sending more prescriptions to fix restricted supply problems   created by your pharmacy, or your insurance.  The pharmacy listed in the electronic medical record should be the one where you want electronic prescriptions to be sent. If you choose to change pharmacy, simply notify our nursing staff.  Recommendations: Keep all of your pain medications in a safe place, under lock and key, even if you live alone. We will NOT replace lost, stolen, or damaged medication. After you fill your prescription, take 1 week's worth of pills and put them away in a safe place. You should keep a separate, properly labeled bottle for this purpose. The remainder should be kept in the original bottle. Use this as your primary supply, until it runs out. Once it's gone, then you know that you have 1 week's worth of medicine, and it is time to come in for a prescription refill. If you do this correctly, it is unlikely that you will ever run out of medicine. To make sure that the above recommendation works, it is very important that you make sure your medication refill appointments are scheduled at least 1 week before you run out of medicine. To do this in an effective manner, make sure that you do not leave the office without scheduling your next medication management appointment. Always ask the nursing staff to show you in your prescription , when your medication will be running out. Then arrange for the receptionist to get you a return appointment, at least 7 days before you run out of medicine. Do not wait until you have 1 or 2 pills left, to come in. This is very poor planning and does not take into consideration that we may need to  cancel appointments due to bad weather, sickness, or emergencies affecting our staff. DO NOT ACCEPT A "Partial Fill": If for any reason your pharmacy does not have enough pills/tablets to completely fill or refill your prescription, do not allow for a "partial fill". The law allows the pharmacy to complete that prescription within 72 hours, without requiring a new prescription. If they do not fill the rest of your prescription within those 72 hours, you will need a separate prescription to fill the remaining amount, which we will NOT provide. If the reason for the partial fill is your insurance, you will need to talk to the pharmacist about payment alternatives for the remaining tablets, but again, DO NOT ACCEPT A PARTIAL FILL, unless you can trust your pharmacist to obtain the remainder of the pills within 72 hours.  Prescription refills and/or changes in medication(s):  Prescription refills, and/or changes in dose or medication, will be conducted only during scheduled medication management appointments. (Applies to both, written and electronic prescriptions.) No refills on procedure days. No medication will be changed or started on procedure days. No changes, adjustments, and/or refills will be conducted on a procedure day. Doing so will interfere with the diagnostic portion of the procedure. No phone refills. No medications will be "called into the pharmacy". No Fax refills. No weekend refills. No Holliday refills. No after hours refills.  Remember:  Business hours are:  Monday to Thursday 8:00 AM to 4:00 PM Provider's Schedule: Dajana Gehrig, MD - Appointments are:  Medication management: Monday and Wednesday 8:00 AM to 4:00 PM Procedure day: Tuesday and Thursday 7:30 AM to 4:00 PM Bilal Lateef, MD - Appointments are:  Medication management: Tuesday and Thursday 8:00 AM to 4:00 PM Procedure day: Monday and Wednesday 7:30 AM to 4:00 PM (Last update:  12/26/2019) ____________________________________________________________________________________________  ____________________________________________________________________________________________  CBD (cannabidiol) WARNING    Applicable to: All individuals currently taking or considering taking CBD (cannabidiol) and, more important, all patients taking opioid analgesic controlled substances (pain medication). (Example: oxycodone; oxymorphone; hydrocodone; hydromorphone; morphine; methadone; tramadol; tapentadol; fentanyl; buprenorphine; butorphanol; dextromethorphan; meperidine; codeine; etc.)  Legal status: CBD remains a Schedule I drug prohibited for any use. CBD is illegal with one exception. In the [place], CBD has a limited Food and Drug Administration (FDA) approval for the treatment of two specific types of epilepsy disorders. Only one CBD product has been approved by the FDA for this purpose: "Epidiolex". FDA is aware that some companies are marketing products containing cannabis and cannabis-derived compounds in ways that violate the Federal Food, Drug and Cosmetic Act (FD&C Act) and that may put the health and safety of consumers at risk. The FDA, a Federal agency, has not enforced the CBD status since 2018.   Legality: Some manufacturers ship CBD products nationally, which is illegal. Often such products are sold online and are therefore available throughout the country. CBD is openly sold in head shops and health food stores in some states where such sales have not been explicitly legalized. Selling unapproved products with unsubstantiated therapeutic claims is not only a violation of the law, but also can put patients at risk, as these products have not been proven to be safe or effective. Federal illegality makes it difficult to conduct research on CBD.  Reference: "FDA Regulation of Cannabis and Cannabis-Derived Products, Including Cannabidiol (CBD)" -  https://www.fda.gov/news-events/public-health-focus/fda-regulation-cannabis-and-cannabis-derived-products-including-cannabidiol-cbd  Warning: CBD is not FDA approved and has not undergo the same manufacturing controls as prescription drugs.  This means that the purity and safety of available CBD may be questionable. Most of the time, despite manufacturer's claims, it is contaminated with THC (delta-9-tetrahydrocannabinol - the chemical in marijuana responsible for the "HIGH").  When this is the case, the THC contaminant will trigger a positive urine drug screen (UDS) test for Marijuana (carboxy-THC). Because a positive UDS for any illicit substance is a violation of our medication agreement, your opioid analgesics (pain medicine) may be permanently discontinued.  MORE ABOUT CBD  General Information: CBD  is a derivative of the Marijuana (cannabis sativa) plant discovered in 1940. It is one of the 113 identified substances found in Marijuana. It accounts for up to 40% of the plant's extract. As of 2018, preliminary clinical studies on CBD included research for the treatment of anxiety, movement disorders, and pain. CBD is available and consumed in multiple forms, including inhalation of smoke or vapor, as an aerosol spray, and by mouth. It may be supplied as an oil containing CBD, capsules, dried cannabis, or as a liquid solution. CBD is thought not to be as psychoactive as THC (delta-9-tetrahydrocannabinol - the chemical in marijuana responsible for the "HIGH"). Studies suggest that CBD may interact with different biological target receptors in the body, including cannabinoid and other neurotransmitter receptors. As of 2018 the mechanism of action for its biological effects has not been determined.  Side-effects  Adverse reactions: Dry mouth, diarrhea, decreased appetite, fatigue, drowsiness, malaise, weakness, sleep disturbances, and others.  Drug interactions: CBC may interact with other medications  such as blood-thinners. (Last update: 01/12/2020) ____________________________________________________________________________________________  ____________________________________________________________________________________________  Drug Holidays (Slow)  What is a "Drug Holiday"? Drug Holiday: is the name given to the period of time during which a patient stops taking a medication(s) for the purpose of eliminating tolerance to the drug.  Benefits Improved effectiveness of opioids. Decreased opioid dose needed to achieve benefits. Improved pain with lesser dose.    What is tolerance? Tolerance: is the progressive decreased in effectiveness of a drug due to its repetitive use. With repetitive use, the body gets use to the medication and as a consequence, it loses its effectiveness. This is a common problem seen with opioid pain medications. As a result, a larger dose of the drug is needed to achieve the same effect that used to be obtained with a smaller dose.  How long should a "Drug Holiday" last? You should stay off of the pain medicine for at least 14 consecutive days. (2 weeks)  Should I stop the medicine "cold [place]"? No. You should always coordinate with your Pain Specialist so that he/she can provide you with the correct medication dose to make the transition as smoothly as possible.  How do I stop the medicine? Slowly. You will be instructed to decrease the daily amount of pills that you take by one (1) pill every seven (7) days. This is called a "slow downward taper" of your dose. For example: if you normally take four (4) pills per day, you will be asked to drop this dose to three (3) pills per day for seven (7) days, then to two (2) pills per day for seven (7) days, then to one (1) per day for seven (7) days, and at the end of those last seven (7) days, this is when the "Drug Holiday" would start.   Will I have withdrawals? By doing a "slow downward taper" like this one, it  is unlikely that you will experience any significant withdrawal symptoms. Typically, what triggers withdrawals is the sudden stop of a high dose opioid therapy. Withdrawals can usually be avoided by slowly decreasing the dose over a prolonged period of time. If you do not follow these instructions and decide to stop your medication abruptly, withdrawals may be possible.  What are withdrawals? Withdrawals: refers to the wide range of symptoms that occur after stopping or dramatically reducing opiate drugs after heavy and prolonged use. Withdrawal symptoms do not occur to patients that use low dose opioids, or those who take the medication sporadically. Contrary to benzodiazepine (example: Valium, Xanax, etc.) or alcohol withdrawals ("Delirium Tremens"), opioid withdrawals are not lethal. Withdrawals are the physical manifestation of the body getting rid of the excess receptors.  Expected Symptoms Early symptoms of withdrawal may include: Agitation Anxiety Muscle aches Increased tearing Insomnia Runny nose Sweating Yawning  Late symptoms of withdrawal may include: Abdominal cramping Diarrhea Dilated pupils Goose bumps Nausea Vomiting  Will I experience withdrawals? Due to the slow nature of the taper, it is very unlikely that you will experience any.  What is a slow taper? Taper: refers to the gradual decrease in dose.  (Last update: 12/26/2019) ____________________________________________________________________________________________   ____________________________________________________________________________________________  Blood Thinners  IMPORTANT NOTICE:  If you take any of these, make sure to notify the nursing staff.  Failure to do so may result in injury.  Recommended time intervals to stop and restart blood-thinners, before & after invasive procedures  Generic Name Brand Name Pre-procedure. Stop this long before procedure. Post-procedure. Minimum waiting period  before restarting.  Abciximab Reopro 15 days 2 hrs  Alteplase Activase 10 days 10 days  Anagrelide Agrylin    Apixaban Eliquis 3 days 6 hrs  Cilostazol Pletal 3 days 5 hrs  Clopidogrel Plavix 7-10 days 2 hrs  Dabigatran Pradaxa 5 days 6 hrs  Dalteparin Fragmin 24 hours 4 hrs  Dipyridamole Aggrenox 11days 2 hrs  Edoxaban Lixiana; Savaysa 3 days 2 hrs  Enoxaparin  Lovenox 24 hours 4 hrs  Eptifibatide Integrillin 8 hours 2 hrs  Fondaparinux  Arixtra 72 hours 12 hrs  Hydroxychloroquine Plaquenil 11 days   Prasugrel Effient 7-10 days 6 hrs  Reteplase Retavase 10 days 10 days  Rivaroxaban Xarelto 3 days 6 hrs  Ticagrelor Brilinta 5-7 days 6 hrs  Ticlopidine Ticlid 10-14 days 2 hrs  Tinzaparin Innohep 24 hours 4 hrs  Tirofiban Aggrastat 8 hours 2 hrs  Warfarin Coumadin 5 days 2 hrs   Other medications with blood-thinning effects  Product indications Generic (Brand) names Note  Cholesterol Lipitor Stop 4 days before procedure  Blood thinner (injectable) Heparin (LMW or LMWH Heparin) Stop 24 hours before procedure  Cancer Ibrutinib (Imbruvica) Stop 7 days before procedure  Malaria/Rheumatoid Hydroxychloroquine (Plaquenil) Stop 11 days before procedure  Thrombolytics  10 days before or after procedures   Over-the-counter (OTC) Products with blood-thinning effects  Product Common names Stop Time  Aspirin > 325 mg Goody Powders, Excedrin, etc. 11 days  Aspirin ? 81 mg  7 days  Fish oil  4 days  Garlic supplements  7 days  Ginkgo biloba  36 hours  Ginseng  24 hours  NSAIDs Ibuprofen, Naprosyn, etc. 3 days  Vitamin E  4 days   ____________________________________________________________________________________________

## 2021-02-25 NOTE — Telephone Encounter (Signed)
I placed a referral to CCM team to see if we can get patient some assistance.

## 2021-02-25 NOTE — Telephone Encounter (Signed)
Called and spoke to patient. He states he would like to know if we provide services that help people with things like utilities, specifically an air conditioner. I told the patient that I was not sure if we did anything like this, but I would reach out and ask.

## 2021-03-09 ENCOUNTER — Telehealth: Payer: Self-pay | Admitting: *Deleted

## 2021-03-09 NOTE — Telephone Encounter (Signed)
   Telephone encounter was:  Unsuccessful.  03/09/2021 Name: Justin [name] MRN: [id number] DOB: [birth year]  Unsuccessful outbound call made today to assist with:  Financial Difficulties related to finances  Outreach Attempt:  1st Attempt  A HIPAA compliant voice message was left requesting a return call.  Instructed patient to call back at   Instructed patient to call back at [phone]  at their earliest convenience. .  [place], Care Management  [phone] [address] , [place] [address] Email : [email or web address]

## 2021-03-19 ENCOUNTER — Telehealth: Payer: Self-pay | Admitting: *Deleted

## 2021-03-19 NOTE — Telephone Encounter (Signed)
   Telephone encounter was:  Unsuccessful.  03/19/2021 Name: Justin [name] MRN: [id number] DOB: [birth year]  Unsuccessful outbound call made today to assist with:    Outreach Attempt:  2nd Attempt  A HIPAA compliant voice message was left requesting a return call.  Instructed patient to call back at   Instructed patient to call back at [phone]  at their earliest convenience. .  [place], Care Management  [phone] [address] , [place] [address] Email : [email or web address]

## 2021-03-20 ENCOUNTER — Telehealth: Payer: Self-pay | Admitting: *Deleted

## 2021-03-20 NOTE — Telephone Encounter (Signed)
   Telephone encounter was:  Unsuccessful.  03/20/2021 Name: Justin [name] MRN: [id number] DOB: 1965/10/20  Unsuccessful outbound call made today to assist with:  Food Insecurity  Outreach Attempt:  3rd Attempt.  Referral closed unable to contact patient.  A HIPAA compliant voice message was left requesting a return call.  Instructed patient to call back at   Instructed patient to call back at [phone]  at their earliest convenience. .  [place], Care Management  [phone] [address] , [place] [address] Email : [email or web address]

## 2021-03-24 ENCOUNTER — Telehealth: Payer: Self-pay | Admitting: *Deleted

## 2021-03-24 NOTE — Telephone Encounter (Signed)
   Telephone encounter was:  Unsuccessful.  03/24/2021 Name: Justin [name] MRN: [id number] DOB: [birth year]  Unsuccessful outbound call made today to assist with:  Financial Difficulties related to FOOD ETC  Outreach Attempt:  3rd Attempt.  Referral closed unable to contact patient.  A HIPAA compliant voice message was left requesting a return call.  Instructed patient to call back at   Instructed patient to call back at [phone]  at their earliest convenience. .  [place], Care Management  [phone] [address] , [place] [address] Email : [email or web address]

## 2021-04-02 ENCOUNTER — Other Ambulatory Visit: Payer: Self-pay | Admitting: Oncology

## 2021-05-18 ENCOUNTER — Telehealth: Payer: Self-pay | Admitting: *Deleted

## 2021-05-18 NOTE — Telephone Encounter (Signed)
Patient called reporting that his appointment got changed to January, biut he will be out of his blood thinner before then and he was tpo discuss taking it at his appointment. He is asking what is to be done about his blood thinners since his appointment has been moved out to January from Dec 21st. Please advise  Assessment and plan- Patient is a 55 y.o. 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  He states that he has had blood clots on Lovenox but I am not seeing any imaging studies at the [facility] health system that suggested a repeat DVT or PE since 2018.  Lovenox does seem to be working well for him although he is getting tired of taking injections twice a day for the last 5 years.  We discussed consideration for Eliquis which she has not tried before.  We will have to see what his insurance payment and co-pay would be.  We will get back to him with this information but until then he will stay on Lovenox.   Hematuria: Patient states that this has been a new issue over the last 1 week.  He did have some flank pain along with it which appears to be improving.  Hemoglobin is stable.  He will continue to monitor his symptoms and if they persist he will let us know and I will refer him to Dr. Bernardo Heater who he has seen in the past for erectile dysfunction.   CBC with differential CMP and see me in 6 months   Visit Diagnosis 1. Left subclavian vein thrombosis ([place])         Dr. Randa Evens, MD, MPH [facility] at [facility] [phone] 11/19/2020 4:54 PM

## 2021-05-19 ENCOUNTER — Other Ambulatory Visit: Payer: Self-pay | Admitting: *Deleted

## 2021-05-19 MED ORDER — APIXABAN 5 MG PO TABS: 5.0000 mg | ORAL_TABLET | Freq: Two times a day (BID) | ORAL | 0 refills | Status: DC

## 2021-05-19 NOTE — Telephone Encounter (Signed)
Called and spoke to pt about his refill. I called [employer] and they have coupon for him for 180 pills which is 90 day supply for 30.00 dollars. Pt good with this and he can get refill 12/19. Pt agreeable to the plan

## 2021-05-22 NOTE — Progress Notes (Signed)
PROVIDER NOTE: Information contained herein reflects review and annotations entered in association with encounter. Interpretation of such information and data should be left to medically-trained personnel. Information provided to patient can be located elsewhere in the medical record under "Patient Instructions". Document created using STT-dictation technology, any transcriptional errors that may result from process are unintentional.    Patient: Justin [name]  Service Category: E/M  Provider: Gaspar Cola, MD  DOB: [birth year]  DOS: 05/25/2021  Specialty: Interventional Pain Management  MRN: [id number]  Setting: Ambulatory outpatient  PCP: Jon Billings, NP  Type: Established Patient    Referring Provider: Jon Billings, NP  Location: Office  Delivery: Face-to-face     HPI  Justin [name], a 55 y.o. year old male, is here today because of his Chronic right-sided thoracic back pain [M54.6, G89.29]. [name] primary complain today is Back Pain (More on the right) Last encounter: My last encounter with him was on 02/25/2021. 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 Pain Assessment: Severity of Chronic pain is reported as a 7 /10. Location: Back Right/pain radiaties across his back on the right. Onset: More than a month ago. Quality: Aching, Constant, Stabbing. Timing: Constant. Modifying factor(s): meds and heat. Vitals:  height is 6' (1.829 m) and weight is 252 lb (114.3 kg). His temperature is 97 F (36.1 C) (abnormal). His blood pressure is 159/92  (abnormal) and his pulse is 73. His respiration is 16 and oxygen saturation is 100%.   Reason for encounter: medication management. The patient indicates doing well with the current medication regimen. No adverse reactions or side effects reported to the medications.   RTCB: 09/02/2021 Nonopioids transferred 03/24/2020: Turmeric, Calcium  Pharmacotherapy Assessment  Analgesic: Oxycodone IR 5 mg, 1 tab PO q 8 hrs (15 mg/day of oxycodone)  MME/day: 22.5 mg/day.   Monitoring: [place] PMP: PDMP reviewed during this encounter.       Pharmacotherapy: No side-effects or adverse reactions reported. Compliance: No problems identified. Effectiveness: Clinically acceptable.  Chauncey Fischer, RN  05/25/2021  1:24 PM  Sign when Signing Visit Nursing Pain Medication Assessment:  Safety precautions to be maintained throughout the outpatient stay will include: orient to surroundings, keep bed in low position, maintain call bell within reach at all times, provide assistance with transfer out of bed and ambulation.  Medication Inspection Compliance: Pill count conducted under aseptic conditions, in front of the patient. Neither the pills nor the bottle was removed from the patient's sight at any time. Once count was completed pills were immediately returned to the patient in their original bottle.  Medication: Oxycodone IR Pill/Patch Count:  29 of 90 pills remain Pill/Patch Appearance: Markings consistent with prescribed medication Bottle Appearance: Standard pharmacy container. Clearly labeled. Filled Date: 37 / 29 / 2022 Last Medication intake:  TodaySafety precautions to be maintained throughout the outpatient stay will include: orient to surroundings, keep bed in low position, maintain call bell within reach at all times, provide assistance with transfer out of bed and ambulation.     UDS:  Summary  Date Value Ref Range Status  11/24/2020 Note  Final  Comment:     ==================================================================== [employer] Select 13 (MW) ==================================================================== Test                             Result       Flag       Units  Drug Present and Declared for Prescription Verification   Oxycodone                      1264         EXPECTED   ng/mg creat   Oxymorphone                    377          EXPECTED   ng/mg creat   Noroxycodone                   979          EXPECTED   ng/mg creat   Noroxymorphone                 117          EXPECTED   ng/mg creat    Sources of oxycodone are scheduled prescription medications.    Oxymorphone, noroxycodone, and noroxymorphone are expected    metabolites of oxycodone. Oxymorphone is also available as a    scheduled prescription medication.  ==================================================================== Test                      Result    Flag   Units      Ref Range   Creatinine              168              mg/dL      >=20 ==================================================================== Declared Medications:  The flagging and interpretation on this report are based on the  following declared medications.  Unexpected results may arise from  inaccuracies in the declared medications.   **Note: The testing scope of this panel includes these medications:   Oxycodone (Roxicodone)   **Note: The testing scope of this panel does not include the  following reported medications:   Acetaminophen (Tylenol)  Albuterol (Ventolin HFA)  Atorvastatin (Lipitor)  Azithromycin (Zithromax)  Benzonatate (Tessalon)  Calcium  Cyclobenzaprine (Fexmid)  Enoxaparin (Lovenox)  Gabapentin (Neurontin)  Ibuprofen (Advil)  Vitamin D ==================================================================== For clinical consultation, please call [phone]. ====================================================================      ROS  Constitutional: Denies  any fever or chills Gastrointestinal: No reported hemesis, hematochezia, vomiting, or acute GI distress Musculoskeletal: Denies any acute onset joint swelling, redness, loss of ROM, or weakness Neurological: No reported episodes of acute onset apraxia, aphasia, dysarthria, agnosia, amnesia, paralysis, loss of coordination, or loss of consciousness  Medication Review  GNP Calcium 1200, acetaminophen, albuterol, apixaban, atorvastatin, and oxyCODONE  History Review  Allergy: Justin [name] has No Known Allergies. Drug: Justin [name]  reports no history of drug use. Alcohol:  reports no history of alcohol use. Tobacco:  reports that he has quit smoking. His smoking use included cigarettes. He smoked an average of .5 packs per day. He has never used smokeless tobacco. Social: Justin [name]  reports that he has quit smoking. His smoking use included cigarettes. He smoked an average of .5 packs per day. He has never used smokeless tobacco. He reports that he does not drink alcohol and does not use drugs. Medical:  has a past medical history of Arm vein blood clot, Coronary artery disease, DVT (deep venous thrombosis) ([place]) (2012; January 2016), GERD (gastroesophageal reflux disease), Rib pain on right side (08/04/2016), and Vascular thoracic outlet syndrome. Surgical: Justin [name]  has a past surgical history that includes Knee cartilage surgery; Carotid angiogram; Scalenotomy w/o resection cervical rib (2012); Right upper extremity thrombectomy (2012); and Colonoscopy with propofol (N/A, 10/10/2019). Family: family history includes Breast cancer (age of onset: 66) in his father; Cancer in his maternal grandmother; Diabetes in his father; Heart attack in his father; Heart disease in his father; Leukemia in his maternal grandfather; Prostate cancer in his maternal uncle.  Laboratory Chemistry Profile   Renal Lab Results  Component Value Date   BUN 12 01/27/2021   CREATININE 1.13 01/27/2021   BCR 11  01/27/2021   GFRAA >60 02/19/2020   GFRNONAA 60 (L) 11/19/2020    Hepatic Lab Results  Component Value Date   AST 12 01/27/2021   ALT 21 01/27/2021   ALBUMIN 4.0 01/27/2021   ALKPHOS 84 01/27/2021   LIPASE 90 03/02/2014    Electrolytes Lab Results  Component Value Date   NA 138 01/27/2021   K 4.2 01/27/2021   CL 104 01/27/2021   CALCIUM 9.2 01/27/2021   MG 2.0 07/28/2017    Bone Lab Results  Component Value Date   VD25OH 28.7 (L) 07/30/2020   25OHVITD1 15 (L) 06/29/2018   25OHVITD2 8.4 06/29/2018   25OHVITD3 6.8 06/29/2018    Inflammation (CRP: Acute Phase) (ESR: Chronic Phase) Lab Results  Component Value Date   CRP 12 (H) 06/29/2018   ESRSEDRATE 27 06/29/2018   LATICACIDVEN 0.9 07/20/2016         Note: Above Lab results reviewed.  Recent Imaging Review  US Venous Img Upper Uni Right(DVT) CLINICAL DATA:  55 year old male with a history of upper extremity thrombosis, new right arm swelling  EXAM: RIGHT UPPER EXTREMITY VENOUS DOPPLER ULTRASOUND  TECHNIQUE: Gray-scale sonography with graded compression, as well as color Doppler and duplex ultrasound were performed to evaluate the upper extremity deep venous system from the level of the subclavian vein and including the jugular, axillary, basilic, radial, ulnar and upper cephalic vein. Spectral Doppler was utilized to evaluate flow at rest and with distal augmentation maneuvers.  COMPARISON:  None.  FINDINGS: Contralateral Subclavian Vein: Respiratory phasicity is normal and symmetric with the symptomatic side. No evidence of thrombus. Normal compressibility.  Internal Jugular Vein: No evidence of thrombus. Normal compressibility, respiratory phasicity and response to augmentation.  Subclavian Vein: No evidence of thrombus. Normal compressibility, respiratory phasicity and response to augmentation.  Axillary Vein: No evidence of thrombus. Normal compressibility, respiratory phasicity and response to  augmentation.  Cephalic Vein: No evidence of thrombus. Normal compressibility, respiratory phasicity and response to augmentation.  Basilic Vein: No evidence of thrombus. Normal compressibility, respiratory phasicity and response to augmentation.  Brachial Veins: No evidence of thrombus. Normal compressibility, respiratory phasicity and response to augmentation.  Radial Veins: No evidence of thrombus. Normal compressibility, respiratory phasicity and response to augmentation.  Ulnar Veins: No evidence of thrombus. Normal compressibility, respiratory phasicity and response to augmentation.  Other Findings:  None visualized.  IMPRESSION: Sonographic survey of the right upper extremity negative for DVT  Electronically Signed   By: Corrie Mckusick D.O.   On: 01/25/2020 15:10 Note: Reviewed        Physical Exam  General appearance: Well nourished, well developed, and well hydrated. In no apparent acute distress Mental status:  Alert, oriented x 3 (person, place, & time)       Respiratory: No evidence of acute respiratory distress Eyes: PERLA Vitals: BP (!) 159/92    Pulse 73    Temp (!) 97 F (36.1 C)    Resp 16    Ht 6' (1.829 m)    Wt 252 lb (114.3 kg)    SpO2 100%    BMI 34.18 kg/m  BMI: Estimated body mass index is 34.18 kg/m as calculated from the following:   Height as of this encounter: 6' (1.829 m).   Weight as of this encounter: 252 lb (114.3 kg). Ideal: Ideal body weight: 77.6 kg (171 lb 1.2 oz) Adjusted ideal body weight: 92.3 kg (203 lb 7.1 oz)  Assessment   Status Diagnosis  Controlled Controlled Controlled 1. Chronic thoracic back pain (1ry area of Pain) (Right)   2. Thoracic radiculitis (2ry area of Pain)   3. Right upper quadrant abdominal pain (3ry area of Pain)   4. Rib pain on right side (4th area of Pain)   5. 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 Intercostal neuralgia (T6, T7) (Right)   7. Chronic low back pain (Left) with  sciatica (Left)   8. Chronic pain syndrome   9. Pharmacologic therapy   10. Chronic use of opiate for therapeutic purpose   11. Encounter for medication management   12. Encounter for chronic pain management      Updated Problems: No problems updated.  Plan of Care  Problem-specific:  No problem-specific Assessment & Plan notes found for this encounter.  Justin [name] has a current medication list which includes the following long-term medication(s): albuterol, apixaban, atorvastatin, [START ON 06/04/2021] oxycodone, [START ON 07/04/2021] oxycodone, [START ON 08/03/2021] oxycodone, and gnp calcium 1200.  Pharmacotherapy (Medications Ordered): Meds ordered this encounter  Medications   oxyCODONE (OXY IR/ROXICODONE) 5 MG immediate release tablet    Sig: Take 1 tablet (5 mg total) by mouth every 8 (eight) hours as needed for severe pain. Must last 30 days    Dispense:  90 tablet    Refill:  0    DO NOT: delete (not duplicate); no partial-fill (will deny script to complete), no refill request (F/U required). DISPENSE: 1 day early if closed on fill date. WARN: No CNS-depressants within 8 hrs of med.   oxyCODONE (OXY IR/ROXICODONE) 5 MG immediate release tablet    Sig: Take 1 tablet (5 mg total) by mouth every 8 (eight) hours as needed for severe pain. Must last 30 days    Dispense:  90 tablet    Refill:  0    DO NOT: delete (not duplicate); no partial-fill (will deny script to complete), no refill request (F/U required). DISPENSE: 1 day early if closed on fill date. WARN: No CNS-depressants within 8 hrs of med.   oxyCODONE (OXY IR/ROXICODONE) 5 MG immediate release tablet    Sig: Take 1 tablet (5 mg total) by mouth every 8 (eight) hours as needed for severe pain. Must last 30 days    Dispense:  90 tablet    Refill:  0    DO NOT: delete (not duplicate); no partial-fill (will deny script to complete), no refill request (F/U required). DISPENSE: 1 day early if closed on fill date.  WARN: No CNS-depressants within 8 hrs of med.   Orders:  No orders of the defined types were placed in this encounter.  Follow-up plan:   Return in about 3 months (around  09/02/2021) for Eval-day (M,W), (F2F), (MM).     Interventional Therapies  Risk   Complexity Considerations:   Estimated body mass index is 34.18 kg/m as calculated from the following:   Height as of this encounter: 6' (1.829 m).   Weight as of this encounter: 252 lb (114.3 kg). WNL   Planned   Pending:      Under consideration:   Diagnostic right thoracic SNRB  Diagnostic right thoracic facet block  Possible right thoracic facet medial branch RFA    Completed:   Palliative right T6, T7, T8, & T9 intercostal NB x2 (10/11/2017) (100/100/50/50)  Palliative right T6, T7, T8, & T9 intercostal nerve RFA x1 (12/29/17)    Therapeutic   Palliative (PRN) options:   Palliative right T6, T7, T8, & T9 intercostal NB #3  Palliative right T6, T7, T8, & T9 intercostal nerve RFA #2     Recent Visits Date Type Provider Dept  02/25/21 Office Visit Milinda Pointer, MD [facility]  Showing recent visits within past 90 days and meeting all other requirements Today's Visits Date Type Provider Dept  05/25/21 Office Visit Milinda Pointer, MD [facility]  Showing today's visits and meeting all other requirements Future Appointments No visits were found meeting these conditions. Showing future appointments within next 90 days and meeting all other requirements  I discussed the assessment and treatment plan with the patient. The patient was provided an opportunity to ask questions and all were answered. The patient agreed with the plan and demonstrated an understanding of the instructions.  Patient advised to call back or seek an in-person evaluation if the symptoms or condition worsens.  Duration of encounter: 30 minutes.  Note by: Gaspar Cola, MD Date: 05/25/2021; Time: 1:41 PM

## 2021-05-25 ENCOUNTER — Ambulatory Visit: Payer: 59 | Attending: Pain Medicine | Admitting: Pain Medicine

## 2021-05-25 ENCOUNTER — Other Ambulatory Visit: Payer: Self-pay

## 2021-05-25 ENCOUNTER — Encounter: Payer: Self-pay | Admitting: Pain Medicine

## 2021-05-25 VITALS — BP 159/92 | HR 73 | Temp 97.0°F | Resp 16 | Ht 72.0 in | Wt 252.0 lb

## 2021-05-25 DIAGNOSIS — M546 Pain in thoracic spine: Secondary | ICD-10-CM | POA: Diagnosis present

## 2021-05-25 DIAGNOSIS — Z79899 Other long term (current) drug therapy: Secondary | ICD-10-CM | POA: Diagnosis present

## 2021-05-25 DIAGNOSIS — G8929 Other chronic pain: Secondary | ICD-10-CM

## 2021-05-25 DIAGNOSIS — G894 Chronic pain syndrome: Secondary | ICD-10-CM | POA: Diagnosis present

## 2021-05-25 DIAGNOSIS — M5414 Radiculopathy, thoracic region: Secondary | ICD-10-CM | POA: Diagnosis present

## 2021-05-25 DIAGNOSIS — R0781 Pleurodynia: Secondary | ICD-10-CM | POA: Diagnosis present

## 2021-05-25 DIAGNOSIS — G588 Other specified mononeuropathies: Secondary | ICD-10-CM

## 2021-05-25 DIAGNOSIS — G8912 Acute post-thoracotomy pain: Secondary | ICD-10-CM | POA: Diagnosis present

## 2021-05-25 DIAGNOSIS — R1011 Right upper quadrant pain: Secondary | ICD-10-CM | POA: Diagnosis present

## 2021-05-25 DIAGNOSIS — M5442 Lumbago with sciatica, left side: Secondary | ICD-10-CM

## 2021-05-25 DIAGNOSIS — Z79891 Long term (current) use of opiate analgesic: Secondary | ICD-10-CM | POA: Diagnosis present

## 2021-05-25 MED ORDER — OXYCODONE HCL 5 MG PO TABS: 5.0000 mg | ORAL_TABLET | Freq: Three times a day (TID) | ORAL | 0 refills | Status: DC | PRN

## 2021-05-25 NOTE — Progress Notes (Signed)
Nursing Pain Medication Assessment:  Safety precautions to be maintained throughout the outpatient stay will include: orient to surroundings, keep bed in low position, maintain call bell within reach at all times, provide assistance with transfer out of bed and ambulation.  Medication Inspection Compliance: Pill count conducted under aseptic conditions, in front of the patient. Neither the pills nor the bottle was removed from the patient's sight at any time. Once count was completed pills were immediately returned to the patient in their original bottle.  Medication: Oxycodone IR Pill/Patch Count:  29 of 90 pills remain Pill/Patch Appearance: Markings consistent with prescribed medication Bottle Appearance: Standard pharmacy container. Clearly labeled. Filled Date: 2 / 29 / 2022 Last Medication intake:  TodaySafety precautions to be maintained throughout the outpatient stay will include: orient to surroundings, keep bed in low position, maintain call bell within reach at all times, provide assistance with transfer out of bed and ambulation.

## 2021-05-25 NOTE — Patient Instructions (Signed)
____________________________________________________________________________________________ ° °Medication Rules ° °Purpose: To inform patients, and their family members, of our rules and regulations. ° °Applies to: All patients receiving prescriptions (written or electronic). ° °Pharmacy of record: Pharmacy where electronic prescriptions will be sent. If written prescriptions are taken to a different pharmacy, please inform the nursing staff. The pharmacy listed in the electronic medical record should be the one where you would like electronic prescriptions to be sent. ° °Electronic prescriptions: In compliance with the [place] Strengthen Opioid Misuse Prevention (STOP) Act of 2017 (Session Law 2017-74/H243), effective June 07, 2018, all controlled substances must be electronically prescribed. Calling prescriptions to the pharmacy will cease to exist. ° °Prescription refills: Only during scheduled appointments. Applies to all prescriptions. ° °NOTE: The following applies primarily to controlled substances (Opioid* Pain Medications).  ° °Type of encounter (visit): For patients receiving controlled substances, face-to-face visits are required. (Not an option or up to the patient.) ° °Patient's responsibilities: °Pain Pills: Bring all pain pills to every appointment (except for procedure appointments). °Pill Bottles: Bring pills in original pharmacy bottle. Always bring the newest bottle. Bring bottle, even if empty. °Medication refills: You are responsible for knowing and keeping track of what medications you take and those you need refilled. °The day before your appointment: write a list of all prescriptions that need to be refilled. °The day of the appointment: give the list to the admitting nurse. Prescriptions will be written only during appointments. No prescriptions will be written on procedure days. °If you forget a medication: it will not be "Called in", "Faxed", or "electronically sent". You will  need to get another appointment to get these prescribed. °No early refills. Do not call asking to have your prescription filled early. °Prescription Accuracy: You are responsible for carefully inspecting your prescriptions before leaving our office. Have the discharge nurse carefully go over each prescription with you, before taking them home. Make sure that your name is accurately spelled, that your address is correct. Check the name and dose of your medication to make sure it is accurate. Check the number of pills, and the written instructions to make sure they are clear and accurate. Make sure that you are given enough medication to last until your next medication refill appointment. °Taking Medication: Take medication as prescribed. When it comes to controlled substances, taking less pills or less frequently than prescribed is permitted and encouraged. °Never take more pills than instructed. °Never take medication more frequently than prescribed.  °Inform other Doctors: Always inform, all of your healthcare providers, of all the medications you take. °Pain Medication from other Providers: You are not allowed to accept any additional pain medication from any other Doctor or Healthcare provider. There are two exceptions to this rule. (see below) In the event that you require additional pain medication, you are responsible for notifying us, as stated below. °Cough Medicine: Often these contain an opioid, such as codeine or hydrocodone. Never accept or take cough medicine containing these opioids if you are already taking an opioid* medication. The combination may cause respiratory failure and death. °Medication Agreement: You are responsible for carefully reading and following our Medication Agreement. This must be signed before receiving any prescriptions from our practice. Safely store a copy of your signed Agreement. Violations to the Agreement will result in no further prescriptions. (Additional copies of our  Medication Agreement are available upon request.) °Laws, Rules, & Regulations: All patients are expected to follow all Federal and State Laws, Statutes, Rules, & Regulations. Ignorance of   the Laws does not constitute a valid excuse.  °Illegal drugs and Controlled Substances: The use of illegal substances (including, but not limited to marijuana and its derivatives) and/or the illegal use of any controlled substances is strictly prohibited. Violation of this rule may result in the immediate and permanent discontinuation of any and all prescriptions being written by our practice. The use of any illegal substances is prohibited. °Adopted CDC guidelines & recommendations: Target dosing levels will be at or below 60 MME/day. Use of benzodiazepines** is not recommended. ° °Exceptions: There are only two exceptions to the rule of not receiving pain medications from other Healthcare Providers. °Exception #1 (Emergencies): In the event of an emergency (i.e.: accident requiring emergency care), you are allowed to receive additional pain medication. However, you are responsible for: As soon as you are able, call our office [phone], at any time of the day or night, and leave a message stating your name, the date and nature of the emergency, and the name and dose of the medication prescribed. In the event that your call is answered by a member of our staff, make sure to document and save the date, time, and the name of the person that took your information.  °Exception #2 (Planned Surgery): In the event that you are scheduled by another doctor or dentist to have any type of surgery or procedure, you are allowed (for a period no longer than 30 days), to receive additional pain medication, for the acute post-op pain. However, in this case, you are responsible for picking up a copy of our "Post-op Pain Management for Surgeons" handout, and giving it to your surgeon or dentist. This document is available at our office, and  does not require an appointment to obtain it. Simply go to our office during business hours (Monday-Thursday from 8:00 AM to 4:00 PM) (Friday 8:00 AM to 12:00 Noon) or if you have a scheduled appointment with us, prior to your surgery, and ask for it by name. In addition, you are responsible for: calling our office [phone], at any time of the day or night, and leaving a message stating your name, name of your surgeon, type of surgery, and date of procedure or surgery. Failure to comply with your responsibilities may result in termination of therapy involving the controlled substances. °Medication Agreement Violation. Following the above rules, including your responsibilities will help you in avoiding a Medication Agreement Violation (“Breaking your Pain Medication Contract”). ° °*Opioid medications include: morphine, codeine, oxycodone, oxymorphone, hydrocodone, hydromorphone, meperidine, tramadol, tapentadol, buprenorphine, fentanyl, methadone. °**Benzodiazepine medications include: diazepam (Valium), alprazolam (Xanax), clonazepam (Klonopine), lorazepam (Ativan), clorazepate (Tranxene), chlordiazepoxide (Librium), estazolam (Prosom), oxazepam (Serax), temazepam (Restoril), triazolam (Halcion) °(Last updated: 03/04/2021) °____________________________________________________________________________________________ ° ____________________________________________________________________________________________ ° °Medication Recommendations and Reminders ° °Applies to: All patients receiving prescriptions (written and/or electronic). ° °Medication Rules & Regulations: These rules and regulations exist for your safety and that of others. They are not flexible and neither are we. Dismissing or ignoring them will be considered "non-compliance" with medication therapy, resulting in complete and irreversible termination of such therapy. (See document titled "Medication Rules" for more details.) In all conscience,  because of safety reasons, we cannot continue providing a therapy where the patient does not follow instructions. ° °Pharmacy of record:  °Definition: This is the pharmacy where your electronic prescriptions will be sent.  °We do not endorse any particular pharmacy, however, we have experienced problems with [facility] not securing enough medication supply for the community. °We do not restrict you   in your choice of pharmacy. However, once we write for your prescriptions, we will NOT be re-sending more prescriptions to fix restricted supply problems created by your pharmacy, or your insurance.  °The pharmacy listed in the electronic medical record should be the one where you want electronic prescriptions to be sent. °If you choose to change pharmacy, simply notify our nursing staff. ° °Recommendations: °Keep all of your pain medications in a safe place, under lock and key, even if you live alone. We will NOT replace lost, stolen, or damaged medication. °After you fill your prescription, take 1 week's worth of pills and put them away in a safe place. You should keep a separate, properly labeled bottle for this purpose. The remainder should be kept in the original bottle. Use this as your primary supply, until it runs out. Once it's gone, then you know that you have 1 week's worth of medicine, and it is time to come in for a prescription refill. If you do this correctly, it is unlikely that you will ever run out of medicine. °To make sure that the above recommendation works, it is very important that you make sure your medication refill appointments are scheduled at least 1 week before you run out of medicine. To do this in an effective manner, make sure that you do not leave the office without scheduling your next medication management appointment. Always ask the nursing staff to show you in your prescription , when your medication will be running out. Then arrange for the receptionist to get you a return appointment,  at least 7 days before you run out of medicine. Do not wait until you have 1 or 2 pills left, to come in. This is very poor planning and does not take into consideration that we may need to cancel appointments due to bad weather, sickness, or emergencies affecting our staff. °DO NOT ACCEPT A "Partial Fill": If for any reason your pharmacy does not have enough pills/tablets to completely fill or refill your prescription, do not allow for a "partial fill". The law allows the pharmacy to complete that prescription within 72 hours, without requiring a new prescription. If they do not fill the rest of your prescription within those 72 hours, you will need a separate prescription to fill the remaining amount, which we will NOT provide. If the reason for the partial fill is your insurance, you will need to talk to the pharmacist about payment alternatives for the remaining tablets, but again, DO NOT ACCEPT A PARTIAL FILL, unless you can trust your pharmacist to obtain the remainder of the pills within 72 hours. ° °Prescription refills and/or changes in medication(s):  °Prescription refills, and/or changes in dose or medication, will be conducted only during scheduled medication management appointments. (Applies to both, written and electronic prescriptions.) °No refills on procedure days. No medication will be changed or started on procedure days. No changes, adjustments, and/or refills will be conducted on a procedure day. Doing so will interfere with the diagnostic portion of the procedure. °No phone refills. No medications will be "called into the pharmacy". °No Fax refills. °No weekend refills. °No Holliday refills. °No after hours refills. ° °Remember:  °Business hours are:  °Monday to Thursday 8:00 AM to 4:00 PM °Provider's Schedule: °Deandrea Rion, MD - Appointments are:  °Medication management: Monday and Wednesday 8:00 AM to 4:00 PM °Procedure day: Tuesday and Thursday 7:30 AM to 4:00 PM °Bilal Lateef, MD -  Appointments are:  °Medication management: Tuesday and Thursday 8:00   AM to 4:00 PM Procedure day: Monday and Wednesday 7:30 AM to 4:00 PM (Last update: 12/26/2019) ____________________________________________________________________________________________

## 2021-05-27 ENCOUNTER — Other Ambulatory Visit: Payer: 59

## 2021-05-27 ENCOUNTER — Ambulatory Visit: Payer: 59 | Admitting: Oncology

## 2021-06-10 ENCOUNTER — Ambulatory Visit: Payer: 59 | Admitting: Oncology

## 2021-06-10 ENCOUNTER — Other Ambulatory Visit: Payer: 59

## 2021-06-11 ENCOUNTER — Inpatient Hospital Stay (HOSPITAL_BASED_OUTPATIENT_CLINIC_OR_DEPARTMENT_OTHER): Payer: 59 | Admitting: Nurse Practitioner

## 2021-06-11 ENCOUNTER — Encounter: Payer: Self-pay | Admitting: Nurse Practitioner

## 2021-06-11 ENCOUNTER — Other Ambulatory Visit: Payer: Self-pay

## 2021-06-11 ENCOUNTER — Inpatient Hospital Stay: Payer: 59 | Attending: Nurse Practitioner

## 2021-06-11 VITALS — BP 141/49 | HR 60 | Temp 97.8°F | Resp 18 | Wt 257.7 lb

## 2021-06-11 DIAGNOSIS — Z809 Family history of malignant neoplasm, unspecified: Secondary | ICD-10-CM | POA: Diagnosis not present

## 2021-06-11 DIAGNOSIS — Z79899 Other long term (current) drug therapy: Secondary | ICD-10-CM | POA: Diagnosis not present

## 2021-06-11 DIAGNOSIS — I82409 Acute embolism and thrombosis of unspecified deep veins of unspecified lower extremity: Secondary | ICD-10-CM | POA: Diagnosis not present

## 2021-06-11 DIAGNOSIS — Z7901 Long term (current) use of anticoagulants: Secondary | ICD-10-CM

## 2021-06-11 DIAGNOSIS — Z8042 Family history of malignant neoplasm of prostate: Secondary | ICD-10-CM | POA: Diagnosis not present

## 2021-06-11 DIAGNOSIS — Z86718 Personal history of other venous thrombosis and embolism: Secondary | ICD-10-CM | POA: Insufficient documentation

## 2021-06-11 DIAGNOSIS — Z8249 Family history of ischemic heart disease and other diseases of the circulatory system: Secondary | ICD-10-CM | POA: Insufficient documentation

## 2021-06-11 DIAGNOSIS — Z806 Family history of leukemia: Secondary | ICD-10-CM | POA: Diagnosis not present

## 2021-06-11 DIAGNOSIS — Z87891 Personal history of nicotine dependence: Secondary | ICD-10-CM | POA: Insufficient documentation

## 2021-06-11 DIAGNOSIS — Z833 Family history of diabetes mellitus: Secondary | ICD-10-CM | POA: Diagnosis not present

## 2021-06-11 DIAGNOSIS — Z803 Family history of malignant neoplasm of breast: Secondary | ICD-10-CM | POA: Insufficient documentation

## 2021-06-11 DIAGNOSIS — I82B12 Acute embolism and thrombosis of left subclavian vein: Secondary | ICD-10-CM

## 2021-06-11 LAB — CBC WITH DIFFERENTIAL/PLATELET
Abs Immature Granulocytes: 0.05 10*3/uL (ref 0.00–0.07)
Basophils Absolute: 0 10*3/uL (ref 0.0–0.1)
Basophils Relative: 0 %
Eosinophils Absolute: 0.3 10*3/uL (ref 0.0–0.5)
Eosinophils Relative: 3 %
HCT: 45.7 % (ref 39.0–52.0)
Hemoglobin: 14.9 g/dL (ref 13.0–17.0)
Immature Granulocytes: 1 %
Lymphocytes Relative: 28 %
Lymphs Abs: 2.7 10*3/uL (ref 0.7–4.0)
MCH: 30.5 pg (ref 26.0–34.0)
MCHC: 32.6 g/dL (ref 30.0–36.0)
MCV: 93.6 fL (ref 80.0–100.0)
Monocytes Absolute: 0.9 10*3/uL (ref 0.1–1.0)
Monocytes Relative: 9 %
Neutro Abs: 5.8 10*3/uL (ref 1.7–7.7)
Neutrophils Relative %: 59 %
Platelets: 248 10*3/uL (ref 150–400)
RBC: 4.88 MIL/uL (ref 4.22–5.81)
RDW: 12.5 % (ref 11.5–15.5)
Smear Review: NORMAL
WBC: 9.7 10*3/uL (ref 4.0–10.5)
nRBC: 0 % (ref 0.0–0.2)

## 2021-06-11 LAB — COMPREHENSIVE METABOLIC PANEL
ALT: 32 U/L (ref 0–44)
AST: 26 U/L (ref 15–41)
Albumin: 3.6 g/dL (ref 3.5–5.0)
Alkaline Phosphatase: 83 U/L (ref 38–126)
Anion gap: 9 (ref 5–15)
BUN: 13 mg/dL (ref 6–20)
CO2: 26 mmol/L (ref 22–32)
Calcium: 9.1 mg/dL (ref 8.9–10.3)
Chloride: 100 mmol/L (ref 98–111)
Creatinine, Ser: 1.42 mg/dL — ABNORMAL HIGH (ref 0.61–1.24)
GFR, Estimated: 58 mL/min — ABNORMAL LOW (ref >60)
Glucose, Bld: 126 mg/dL — ABNORMAL HIGH (ref 70–99)
Potassium: 4 mmol/L (ref 3.5–5.1)
Sodium: 135 mmol/L (ref 135–145)
Total Bilirubin: 0.6 mg/dL (ref 0.3–1.2)
Total Protein: 7.1 g/dL (ref 6.5–8.1)

## 2021-06-11 NOTE — Progress Notes (Signed)
Hematology/Oncology Consult Note [facility]  [phone] [phone]  Patient Care Team: Jon Billings, NP as PCP - General Schnier, Dolores Lory, MD (Vascular Surgery)   Name of the patient: Justin [name]  [id number]  October 03, 1965   Date of visit: 06/11/21  Diagnosis-history of recurrent DVT and PE on Lovenox  Chief complaint/ Reason for visit-routine follow-up of recurrent DVT  Heme/Onc history: Justin [name] is a 56 y.o. male with recurrent thrombosis x 4.  He developed a right upper extremity clot in 2010. He underwent thrombolysis.  He was on Coumadin x 6 months.  He developed a recurrent clot in the right upper extremity in 2011.  He underwent thoracic outlet decompression.  He was on Coumadin for 6-9 months.  He developed a right lower extremity DVT in 2016.  He was started on Xarelto.   He developed a left upper extremity DVT on 06/07/2016 while on Xarelto.  Duplex revealed a near occlusive DVT in the lateral aspect of the left subclavian vein and nonocclusive thrombus extending into the adjacent axillary vein.  He was started on Lovenox.  Hypercoagulable work-up was otherwise negative.  Lupus anticoagulant positive likely because he was on anticoagulation and therefore cannot be interpreted accurately.  Interval history-patient is a 56 year old male who returns to clinic for follow-up.  He was previously taking Lovenox since 2018 after failing Xarelto.  Previously insurance would not cover Eliquis.  In the interim, we have been able to assist patient with coupon cards and he has been able to start and continue Eliquis.  No interval DVTs.  Feels well and denies specific complaints. Denies additional hematuria.   ECOG PS- 1 Pain scale- 7 (chronic pain managed by Dr. Dossie Arbour)  Review of systems- Review of Systems  Constitutional:  Negative for chills, fever, malaise/fatigue and weight loss.  HENT:  Negative for hearing loss,  nosebleeds, sore throat and tinnitus.   Eyes:  Negative for blurred vision and double vision.  Respiratory:  Negative for cough, hemoptysis, shortness of breath and wheezing.   Cardiovascular:  Negative for chest pain, palpitations and leg swelling.  Gastrointestinal:  Negative for abdominal pain, blood in stool, constipation, diarrhea, melena, nausea and vomiting.  Genitourinary:  Negative for dysuria and urgency.  Musculoskeletal:  Negative for back pain, falls, joint pain and myalgias.  Skin:  Negative for itching and rash.  Neurological:  Negative for dizziness, tingling, sensory change, loss of consciousness, weakness and headaches.  Endo/Heme/Allergies:  Negative for environmental allergies. Does not bruise/bleed easily.  Psychiatric/Behavioral:  Negative for depression. The patient is not nervous/anxious and does not have insomnia.     No Known Allergies  Past Medical History:  Diagnosis Date   Arm vein blood clot    right   Coronary artery disease    DVT (deep venous thrombosis) ([place]) 2012; January 2016   History of right arm; now right lower shotty   GERD (gastroesophageal reflux disease)    Rib pain on right side 08/04/2016   RT rib pain that radiates around to the back. Level 8 of 10.  Constant. Started 1 mo ago, 1 week after DVT.   Vascular thoracic outlet syndrome    Right      Past Surgical History:  Procedure Laterality Date   CAROTID ANGIOGRAM     COLONOSCOPY WITH PROPOFOL N/A 10/10/2019   Procedure: COLONOSCOPY WITH PROPOFOL;  Surgeon: Jonathon Bellows, MD;  Location: [facility] ENDOSCOPY;  Service: Gastroenterology;  Laterality: N/A;   KNEE CARTILAGE SURGERY  Performed for cracked patella and torn meniscus   Right upper extremity thrombectomy  2012   SCALENOTOMY W/O RESECTION CERVICAL RIB  2012   4 right-sided thoracic outlet syndrome    Social History   Socioeconomic History   Marital status: Married    Spouse name: Not on file   Number of children: Not on file    Years of education: Not on file   Highest education level: Not on file  Occupational History    Comment: [employer]  Tobacco Use   Smoking status: Former    Packs/day: 0.50    Types: Cigarettes   Smokeless tobacco: Never   Tobacco comments:    quit smoking in 2010  Vaping Use   Vaping Use: Never used  Substance and Sexual Activity   Alcohol use: No   Drug use: No   Sexual activity: Yes    Birth control/protection: None  Other Topics Concern   Not on file  Social History Narrative   He works full-time at [employer].    He is married. Nonsmoker who is not currently. Alcohol.   Social Determinants of Health   Financial Resource Strain: Low Risk    Difficulty of Paying Living Expenses: Not very hard  Food Insecurity: No Food Insecurity   Worried About Charity fundraiser in the Last Year: Never true   Ran Out of Food in the Last Year: Never true  Transportation Needs: No Transportation Needs   Lack of Transportation (Medical): No   Lack of Transportation (Non-Medical): No  Physical Activity: Inactive   Days of Exercise per Week: 0 days   Minutes of Exercise per Session: 0 min  Stress: No Stress Concern Present   Feeling of Stress : Not at all  Social Connections: Moderately Isolated   Frequency of Communication with Friends and Family: Once a week   Frequency of Social Gatherings with Friends and Family: Never   Attends Religious Services: More than 4 times per year   Active Member of [employer] or Organizations: No   Attends Music therapist: Never   Marital Status: Married  Human resources officer Violence: Not At Risk   Fear of Current or Ex-Partner: No   Emotionally Abused: No   Physically Abused: No   Sexually Abused: No    Family History  Problem Relation Age of Onset   Heart disease Father    Breast cancer Father 60   Diabetes Father    Heart attack Father    Cancer Maternal Grandmother    Leukemia Maternal Grandfather    Prostate cancer Maternal Uncle       Current Outpatient Medications:    acetaminophen (TYLENOL) 500 MG tablet, Take 1,000 mg by mouth every 6 (six) hours as needed., Disp: , Rfl:    albuterol (VENTOLIN HFA) 108 (90 Base) MCG/ACT inhaler, TAKE 2 PUFFS BY MOUTH EVERY 6 HOURS AS NEEDED FOR WHEEZE OR SHORTNESS OF BREATH, Disp: 8.5 each, Rfl: 0   apixaban (ELIQUIS) 5 MG TABS tablet, Take 1 tablet (5 mg total) by mouth 2 (two) times daily., Disp: 180 tablet, Rfl: 0   atorvastatin (LIPITOR) 80 MG tablet, TAKE 1 TABLET BY MOUTH EVERY DAY, Disp: 90 tablet, Rfl: 1   Calcium Carbonate-Vit D-Min (GNP CALCIUM 1200) 1200-1000 MG-UNIT CHEW, Chew 1,200 mg by mouth daily with breakfast. Take in combination with vitamin D and magnesium., Disp: 30 tablet, Rfl: 2   oxyCODONE (OXY IR/ROXICODONE) 5 MG immediate release tablet, Take 1 tablet (5 mg total) by mouth  every 8 (eight) hours as needed for severe pain. Must last 30 days, Disp: 90 tablet, Rfl: 0   [START ON 07/04/2021] oxyCODONE (OXY IR/ROXICODONE) 5 MG immediate release tablet, Take 1 tablet (5 mg total) by mouth every 8 (eight) hours as needed for severe pain. Must last 30 days, Disp: 90 tablet, Rfl: 0   [START ON 08/03/2021] oxyCODONE (OXY IR/ROXICODONE) 5 MG immediate release tablet, Take 1 tablet (5 mg total) by mouth every 8 (eight) hours as needed for severe pain. Must last 30 days, Disp: 90 tablet, Rfl: 0  Physical exam:  Vitals:   06/11/21 1010  BP: (!) 141/49  Pulse: 60  Resp: 18  Temp: 97.8 F (36.6 C)  SpO2: (!) 9%  Weight: 257 lb 11.2 oz (116.9 kg)   Physical Exam Constitutional:      General: He is not in acute distress. Cardiovascular:     Rate and Rhythm: Normal rate and regular rhythm.     Heart sounds: Normal heart sounds.  Pulmonary:     Effort: Pulmonary effort is normal.     Breath sounds: Normal breath sounds.  Abdominal:     General: Bowel sounds are normal.     Palpations: Abdomen is soft.  Skin:    General: Skin is warm and dry.  Neurological:      Mental Status: He is alert and oriented to person, place, and time.     CMP Latest Ref Rng & Units 01/27/2021  Glucose 65 - 99 mg/dL 109(H)  BUN 6 - 24 mg/dL 12  Creatinine 0.76 - 1.27 mg/dL 1.13  Sodium 134 - 144 mmol/L 138  Potassium 3.5 - 5.2 mmol/L 4.2  Chloride 96 - 106 mmol/L 104  CO2 20 - 29 mmol/L 21  Calcium 8.7 - 10.2 mg/dL 9.2  Total Protein 6.0 - 8.5 g/dL 6.6  Total Bilirubin 0.0 - 1.2 mg/dL 0.3  Alkaline Phos 44 - 121 IU/L 84  AST 0 - 40 IU/L 12  ALT 0 - 44 IU/L 21   CBC Latest Ref Rng & Units 11/19/2020  WBC 4.0 - 10.5 K/uL 7.7  Hemoglobin 13.0 - 17.0 g/dL 14.7  Hematocrit 39.0 - 52.0 % 44.1  Platelets 150 - 400 K/uL 259      Assessment and plan- Patient is a 56 y.o. male with history of recurrent DVT/PE while on xarelto, then on lovenox x 2+ years, now on eliquis returns to clinic for routine follow up.   Recurrent DVT/PE: Experienced blood clot on xarelto. Was on lovenox since ~ 2018. Now on eliquis. Tolerating well with no interval clots. Labs today reassuring. Continue eliquis.   Hematuria- resolved  Disposition 6 mo- lab, [facility]   Visit Diagnosis 1. Encounter for current long-term use of anticoagulants   2. Recurrent deep vein thrombosis (DVT) of lower extremity, unspecified laterality ([place])   3. Long term current use of anticoagulant therapy    Beckey Rutter, [place], AGNP-C [facility] [phone] (clinic) 06/11/21

## 2021-07-19 ENCOUNTER — Other Ambulatory Visit: Payer: Self-pay | Admitting: Nurse Practitioner

## 2021-07-19 DIAGNOSIS — E782 Mixed hyperlipidemia: Secondary | ICD-10-CM

## 2021-07-20 NOTE — Telephone Encounter (Signed)
Requested Prescriptions  Pending Prescriptions Disp Refills   atorvastatin (LIPITOR) 80 MG tablet [Pharmacy Med Name: ATORVASTATIN 80 MG TABLET] 90 tablet 1    Sig: TAKE 1 TABLET BY MOUTH EVERY DAY     Cardiovascular:  Antilipid - Statins Failed - 07/19/2021 11:35 AM      Failed - Lipid Panel in normal range within the last 12 months    Cholesterol, Total  Date Value Ref Range Status  01/27/2021 192 100 - 199 mg/dL Final   LDL Chol Calc (NIH)  Date Value Ref Range Status  01/27/2021 106 (H) 0 - 99 mg/dL Final   HDL  Date Value Ref Range Status  01/27/2021 60 >39 mg/dL Final   Triglycerides  Date Value Ref Range Status  01/27/2021 151 (H) 0 - 149 mg/dL Final         Passed - Patient is not pregnant      Passed - Valid encounter within last 12 months    Recent Outpatient Visits          5 months ago Mixed hyperlipidemia   [place], NP   10 months ago [place], NP   11 months ago Mixed hyperlipidemia   [facility] Jon Billings, NP   1 year ago IFG (impaired fasting glucose)   [facility], Lilia Argue, [place]   1 year ago [place], Lilia Argue, [place]      Future Appointments            In 1 week Jon Billings, NP [facility], [place]

## 2021-07-30 ENCOUNTER — Ambulatory Visit (INDEPENDENT_AMBULATORY_CARE_PROVIDER_SITE_OTHER): Payer: 59 | Admitting: Nurse Practitioner

## 2021-07-30 ENCOUNTER — Other Ambulatory Visit: Payer: Self-pay

## 2021-07-30 ENCOUNTER — Encounter: Payer: Self-pay | Admitting: Nurse Practitioner

## 2021-07-30 VITALS — BP 107/69 | HR 75 | Temp 98.4°F | Wt 252.6 lb

## 2021-07-30 DIAGNOSIS — E782 Mixed hyperlipidemia: Secondary | ICD-10-CM

## 2021-07-30 DIAGNOSIS — R7301 Impaired fasting glucose: Secondary | ICD-10-CM | POA: Diagnosis not present

## 2021-07-30 DIAGNOSIS — E559 Vitamin D deficiency, unspecified: Secondary | ICD-10-CM

## 2021-07-30 DIAGNOSIS — I82511 Chronic embolism and thrombosis of right femoral vein: Secondary | ICD-10-CM

## 2021-07-30 MED ORDER — ALBUTEROL SULFATE HFA 108 (90 BASE) MCG/ACT IN AERS: INHALATION_SPRAY | RESPIRATORY_TRACT | 1 refills | Status: AC

## 2021-07-30 NOTE — Progress Notes (Signed)
BP 107/69    Pulse 75    Temp 98.4 F (36.9 C) (Oral)    Wt 252 lb 9.6 oz (114.6 kg)    SpO2 98%    BMI 34.26 kg/m    Subjective:    Patient ID: Justin [name], male    DOB: 04/13/1966, 56 y.o.   MRN: [id number]  HPI: Justin [name] is a 56 y.o. male  Chief Complaint  Patient presents with   Hyperlipidemia   HYPERLIPIDEMIA Hyperlipidemia status: excellent compliance Satisfied with current treatment?  yes Side effects:  no Medication compliance: excellent compliance Past cholesterol meds: atorvastain (lipitor) Supplements: none Aspirin:  no The 10-year ASCVD risk score (Arnett DK, et al., 2019) is: 4.8%   Values used to calculate the score:     Age: 66 years     Sex: Male     Is Non-Hispanic African American: Yes     Diabetic: No     Tobacco smoker: No     Systolic Blood Pressure: 595 mmHg     Is BP treated: No     HDL Cholesterol: 60 mg/dL     Total Cholesterol: 192 mg/dL Chest pain:  no Coronary artery disease:  no Family history CAD:  no Family history early CAD:  no   Denies HA, CP, dizziness, palpitations, visual changes, SOB, and lower extremity swelling.     Relevant past medical, surgical, family and social history reviewed and updated as indicated. Interim medical history since our last visit reviewed. Allergies and medications reviewed and updated.  Review of Systems  Eyes:  Negative for visual disturbance.  Respiratory:  Negative for chest tightness and shortness of breath.   Cardiovascular:  Negative for chest pain, palpitations and leg swelling.  Neurological:  Negative for dizziness, light-headedness and headaches.   Per HPI unless specifically indicated above     Objective:    BP 107/69    Pulse 75    Temp 98.4 F (36.9 C) (Oral)    Wt 252 lb 9.6 oz (114.6 kg)    SpO2 98%    BMI 34.26 kg/m   Wt Readings from Last 3 Encounters:  07/30/21 252 lb 9.6 oz (114.6 kg)  06/11/21 257 lb 11.2 oz (116.9 kg)  05/25/21 252 lb (114.3 kg)     Physical Exam Vitals and nursing note reviewed.  Constitutional:      General: He is not in acute distress.    Appearance: Normal appearance. He is not ill-appearing, toxic-appearing or diaphoretic.  HENT:     Head: Normocephalic.     Right Ear: External ear normal.     Left Ear: External ear normal.     Nose: Nose normal. No congestion or rhinorrhea.     Mouth/Throat:     Mouth: Mucous membranes are moist.  Eyes:     General:        Right eye: No discharge.        Left eye: No discharge.     Extraocular Movements: Extraocular movements intact.     Conjunctiva/sclera: Conjunctivae normal.     Pupils: Pupils are equal, round, and reactive to light.  Cardiovascular:     Rate and Rhythm: Normal rate and regular rhythm.     Heart sounds: No murmur heard. Pulmonary:     Effort: Pulmonary effort is normal. No respiratory distress.     Breath sounds: Examination of the right-lower field reveals decreased breath sounds. Examination of the left-lower field reveals decreased breath sounds. Decreased  breath sounds present. No wheezing, rhonchi or rales.  Abdominal:     General: Abdomen is flat. Bowel sounds are normal.  Musculoskeletal:     Cervical back: Normal range of motion and neck supple.  Skin:    General: Skin is warm and dry.     Capillary Refill: Capillary refill takes less than 2 seconds.  Neurological:     General: No focal deficit present.     Mental Status: He is alert and oriented to person, place, and time.  Psychiatric:        Mood and Affect: Mood normal.        Behavior: Behavior normal.        Thought Content: Thought content normal.        Judgment: Judgment normal.    Results for orders placed or performed in visit on 06/11/21  Comprehensive metabolic panel  Result Value Ref Range   Sodium 135 135 - 145 mmol/L   Potassium 4.0 3.5 - 5.1 mmol/L   Chloride 100 98 - 111 mmol/L   CO2 26 22 - 32 mmol/L   Glucose, Bld 126 (H) 70 - 99 mg/dL   BUN 13 6 - 20 mg/dL    Creatinine, Ser 1.42 (H) 0.61 - 1.24 mg/dL   Calcium 9.1 8.9 - 10.3 mg/dL   Total Protein 7.1 6.5 - 8.1 g/dL   Albumin 3.6 3.5 - 5.0 g/dL   AST 26 15 - 41 U/L   ALT 32 0 - 44 U/L   Alkaline Phosphatase 83 38 - 126 U/L   Total Bilirubin 0.6 0.3 - 1.2 mg/dL   GFR, Estimated 58 (L) >60 mL/min   Anion gap 9 5 - 15  CBC with Differential/Platelet  Result Value Ref Range   WBC 9.7 4.0 - 10.5 K/uL   RBC 4.88 4.22 - 5.81 MIL/uL   Hemoglobin 14.9 13.0 - 17.0 g/dL   HCT 45.7 39.0 - 52.0 %   MCV 93.6 80.0 - 100.0 fL   MCH 30.5 26.0 - 34.0 pg   MCHC 32.6 30.0 - 36.0 g/dL   RDW 12.5 11.5 - 15.5 %   Platelets 248 150 - 400 K/uL   nRBC 0.0 0.0 - 0.2 %   Neutrophils Relative % 59 %   Neutro Abs 5.8 1.7 - 7.7 K/uL   Lymphocytes Relative 28 %   Lymphs Abs 2.7 0.7 - 4.0 K/uL   Monocytes Relative 9 %   Monocytes Absolute 0.9 0.1 - 1.0 K/uL   Eosinophils Relative 3 %   Eosinophils Absolute 0.3 0.0 - 0.5 K/uL   Basophils Relative 0 %   Basophils Absolute 0.0 0.0 - 0.1 K/uL   WBC Morphology DIFF. CONFIRMED BY SMEAR    RBC Morphology MORPHOLOGY UNREMARKABLE    Smear Review Normal platelet morphology    Immature Granulocytes 1 %   Abs Immature Granulocytes 0.05 0.00 - 0.07 K/uL      Assessment & Plan:   Problem List Items Addressed This Visit       Cardiovascular and Mediastinum   DVT (deep venous thrombosis) (HCC)    Patient was switched from Lovenox to Eliquis.  He is tolerating it well.  Denies concerns at visit today. Continue to follow up with Hematology.        Endocrine   IFG (impaired fasting glucose) - Primary    Labs ordered today. Will make recommendations based on lab results.       Relevant Orders   Comp Met (CMET)  HgB A1c     Other   Vitamin D deficiency    Labs ordered today. Will make recommendations based on lab results.       Relevant Orders   Comp Met (CMET)   Vitamin D (25 hydroxy)   Mixed hyperlipidemia    Chronic.  Controlled.  Continue with  current medication regimen of Atorvastatin daily.  Labs ordered today.  Return to clinic in 6 months for reevaluation.  Call sooner if concerns arise.        Relevant Orders   Comp Met (CMET)   Lipid Profile     Follow up plan: Return in about 6 months (around 01/27/2022) for Physical and Fasting labs.

## 2021-07-30 NOTE — Assessment & Plan Note (Signed)
Chronic.  Controlled.  Continue with current medication regimen of Atorvastatin daily.  Labs ordered today.  Return to clinic in 6 months for reevaluation.  Call sooner if concerns arise.   

## 2021-07-30 NOTE — Assessment & Plan Note (Signed)
Labs ordered today.  Will make recommendations based on lab results. ?

## 2021-07-30 NOTE — Assessment & Plan Note (Signed)
Patient was switched from Lovenox to Eliquis.  He is tolerating it well.  Denies concerns at visit today. Continue to follow up with Hematology.

## 2021-07-31 LAB — COMPREHENSIVE METABOLIC PANEL
ALT: 31 IU/L (ref 0–44)
AST: 21 IU/L (ref 0–40)
Albumin/Globulin Ratio: 1.5 (ref 1.2–2.2)
Albumin: 4.2 g/dL (ref 3.8–4.9)
Alkaline Phosphatase: 98 IU/L (ref 44–121)
BUN/Creatinine Ratio: 8 — ABNORMAL LOW (ref 9–20)
BUN: 10 mg/dL (ref 6–24)
Bilirubin Total: 0.3 mg/dL (ref 0.0–1.2)
CO2: 25 mmol/L (ref 20–29)
Calcium: 10 mg/dL (ref 8.7–10.2)
Chloride: 100 mmol/L (ref 96–106)
Creatinine, Ser: 1.27 mg/dL (ref 0.76–1.27)
Globulin, Total: 2.8 g/dL (ref 1.5–4.5)
Glucose: 123 mg/dL — ABNORMAL HIGH (ref 70–99)
Potassium: 4.5 mmol/L (ref 3.5–5.2)
Sodium: 141 mmol/L (ref 134–144)
Total Protein: 7 g/dL (ref 6.0–8.5)
eGFR: 66 mL/min/{1.73_m2} (ref >59)

## 2021-07-31 LAB — HEMOGLOBIN A1C
Est. average glucose Bld gHb Est-mCnc: 123 mg/dL
Hgb A1c MFr Bld: 5.9 % — ABNORMAL HIGH (ref 4.8–5.6)

## 2021-07-31 LAB — LIPID PANEL
Chol/HDL Ratio: 3.7 ratio (ref 0.0–5.0)
Cholesterol, Total: 206 mg/dL — ABNORMAL HIGH (ref 100–199)
HDL: 55 mg/dL (ref >39)
LDL Chol Calc (NIH): 117 mg/dL — ABNORMAL HIGH (ref 0–99)
Triglycerides: 196 mg/dL — ABNORMAL HIGH (ref 0–149)
VLDL Cholesterol Cal: 34 mg/dL (ref 5–40)

## 2021-07-31 LAB — VITAMIN D 25 HYDROXY (VIT D DEFICIENCY, FRACTURES): Vit D, 25-Hydroxy: 43 ng/mL (ref 30.0–100.0)

## 2021-07-31 NOTE — Progress Notes (Signed)
Please let patient know that his lab work shows that his kidney function has improved.  His cholesterol has increased.  Continue with the Atorvastatin 80mg  daily and follow a low fat diet.  His A1c remains well controlled at 5.9.  His Vitamin D level is within normal range.  No concerns at this time.  Follow up as discussed.

## 2021-08-21 ENCOUNTER — Other Ambulatory Visit: Payer: Self-pay | Admitting: Oncology

## 2021-08-26 ENCOUNTER — Encounter: Payer: PRIVATE HEALTH INSURANCE | Admitting: Pain Medicine

## 2021-08-30 NOTE — Progress Notes (Signed)
PROVIDER NOTE: Information contained herein reflects review and annotations entered in association with encounter. Interpretation of such information and data should be left to medically-trained personnel. Information provided to patient can be located elsewhere in the medical record under "Patient Instructions". Document created using STT-dictation technology, any transcriptional errors that may result from process are unintentional.  ?  ?Patient: Justin [name]  Service Category: E/M  Provider: Gaspar Cola, MD  ?DOB: 10/24/1965  DOS: 08/31/2021  Specialty: Interventional Pain Management  ?MRN: [id number]  Setting: Ambulatory outpatient  PCP: Jon Billings, NP  ?Type: Established Patient    Referring Provider: Jon Billings, NP  ?Location: Office  Delivery: Face-to-face    ? ?HPI  ?[name], a 56 y.o. year old male, is here today because of his Chronic pain syndrome [G89.4]. Mr. Paragas primary complain today is rib cage (Right side) ?Last encounter: My last encounter with him was on 05/25/2021. ?Pertinent problems: Mr. 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 ?Pain Assessment: Severity of Chronic pain is reported as a 7 /10. Location: Rib cage Right/ . Onset: More than a month ago. Quality: Aching, Constant, Discomfort. Timing: Constant. Modifying factor(s): Meds and laying down. ?Vitals:  height is 6' (1.829 m) and weight is 252 lb (114.3 kg). His temperature is 97.3 ?F (36.3 ?C) (abnormal). His blood pressure is 152/96 (abnormal) and his pulse is 64. His respiration is 16 and oxygen  saturation is 99%.  ? ?Reason for encounter: medication management.   The patient indicates doing well with the current medication regimen. No adverse reactions or side effects reported to the medications.  ? ?RTCB: 12/02/2021 ?Nonopioids transferred 03/24/2020: Turmeric, Calcium ? ?Pharmacotherapy Assessment  ?Analgesic: Oxycodone IR 5 mg, 1 tab PO q 8 hrs (15 mg/day of oxycodone)  ?MME/day: 22.5 mg/day.  ? ?Monitoring: ?[place] PMP: PDMP reviewed during this encounter.       ?Pharmacotherapy: No side-effects or adverse reactions reported. ?Compliance: No problems identified. ?Effectiveness: Clinically acceptable. ? ?Chauncey Fischer, RN  08/31/2021  1:29 PM  Sign when Signing Visit ?Nursing Pain Medication Assessment:  ?Safety precautions to be maintained throughout the outpatient stay will include: orient to surroundings, keep bed in low position, maintain call bell within reach at all times, provide assistance with transfer out of bed and ambulation.  ?Medication Inspection Compliance: Pill count conducted under aseptic conditions, in front of the patient. Neither the pills nor the bottle was removed from the patient's sight at any time. Once count was completed pills were immediately returned to the patient in their original bottle. ? ?Medication: Oxycodone IR ?Pill/Patch Count:  5 of 90 pills remain ?Pill/Patch Appearance: Markings consistent with prescribed medication ?Bottle Appearance: Standard pharmacy container. Clearly labeled. ?Filled Date: 2 / 30 / 2023 ?Last Medication intake:  TodaySafety precautions to be maintained throughout the outpatient stay will include: orient to surroundings, keep bed in low position, maintain call bell within reach at all times, provide assistance with transfer out of bed and ambulation.  ?   UDS:  ?Summary  ?Date Value Ref Range Status  ?11/24/2020 Note  Final  ?  Comment:  ?  ==================================================================== ?[facility]  Select 13  (MW) ?==================================================================== ?Test                             Result       Flag       Units ? ?Drug Present and Declared for Prescription Verification ?  Oxycodone                      1264         EXPECTED   ng/mg creat ?  Oxymorphone                    377          EXPECTED   ng/mg creat ?  Noroxycodone                   979          EXPECTED   ng/mg creat ?  Noroxymorphone                 117          EXPECTED   ng/mg creat ?   Sources of oxycodone are scheduled prescription medications. ?   Oxymorphone, noroxycodone, and noroxymorphone are expected ?   metabolites of oxycodone. Oxymorphone is also available as a ?   scheduled prescription medication. ? ?==================================================================== ?Test                      Result    Flag   Units      Ref Range ?  Creatinine              168              mg/dL      >=20 ?==================================================================== ?Declared Medications: ? The flagging and interpretation on this report are based on the ? following declared medications.  Unexpected results may arise from ? inaccuracies in the declared medications. ? ? **Note: The testing scope of this panel includes these medications: ? ? Oxycodone (Roxicodone) ? ? **Note: The testing scope of this panel does not include the ? following reported medications: ? ? Acetaminophen (Tylenol) ? Albuterol (Ventolin HFA) ? Atorvastatin (Lipitor) ? Azithromycin (Zithromax) ? Benzonatate (Tessalon) ? Calcium ? Cyclobenzaprine (Fexmid) ? Enoxaparin (Lovenox) ? Gabapentin (Neurontin) ? Ibuprofen (Advil) ? Vitamin D ?==================================================================== ?For clinical consultation, please call [phone]. ?==================================================================== ?  ?  ? ?ROS  ?Constitutional: Denies any fever or chills ?Gastrointestinal: No reported hemesis, hematochezia, vomiting, or  acute GI distress ?Musculoskeletal: Denies any acute onset joint swelling, redness, loss of ROM, or weakness ?Neurological: No reported episodes of acute onset apraxia, aphasia, dysarthria, agnosia, amnesia, paralysis, loss of coordination, or loss of consciousness ? ?Medication Review  ?GNP Calcium 1200, acetaminophen, albuterol, apixaban, atorvastatin, and oxyCODONE ? ?History Review  ?Allergy: Mr. Sermons has No Known Allergies. ?Drug: Mr. Ferdig  reports no history of drug use. ?Alcohol:  reports no history of alcohol use. ?Tobacco:  reports that he does not have a smoking history on file. He has never used smokeless tobacco. ?Social: Mr. Helwig  reports that he does not have a smoking history on file. He has never used smokeless tobacco. He reports that he does not drink alcohol and does not use drugs. ?Medical:  has a past medical history of Arm vein blood clot, Coronary artery disease, DVT (deep venous thrombosis) ([facility]) (2012; January 2016), GERD (gastroesophageal reflux  disease), Rib pain on right side (08/04/2016), and Vascular thoracic outlet syndrome. ?Surgical: Mr. Stager  has a past surgical history that includes Knee cartilage surgery; Carotid angiogram; Scalenotomy w/o resection cervical rib (2012); Right upper extremity thrombectomy (2012); and Colonoscopy with propofol (N/A, 10/10/2019). ?Family: family history includes Breast cancer (age of onset: 52) in his father; Cancer in his maternal grandmother; Diabetes in his father; Heart attack in his father; Heart disease in his father; Leukemia in his maternal grandfather; Prostate cancer in his maternal uncle. ? ?Laboratory Chemistry Profile  ? ?Renal ?Lab Results  ?Component Value Date  ? BUN 10 07/30/2021  ? CREATININE 1.27 07/30/2021  ? BCR 8 (L) 07/30/2021  ? GFRAA >60 02/19/2020  ? GFRNONAA 58 (L) 06/11/2021  ?  Hepatic ?Lab Results  ?Component Value Date  ? AST 21 07/30/2021  ? ALT 31 07/30/2021  ? ALBUMIN 4.2 07/30/2021  ? ALKPHOS 98 07/30/2021   ? LIPASE 90 03/02/2014  ?  ?Electrolytes ?Lab Results  ?Component Value Date  ? NA 141 07/30/2021  ? K 4.5 07/30/2021  ? CL 100 07/30/2021  ? CALCIUM 10.0 07/30/2021  ? MG 2.0 07/28/2017  ?  Bone ?Lab Results  ?Compone

## 2021-08-31 ENCOUNTER — Other Ambulatory Visit: Payer: Self-pay

## 2021-08-31 ENCOUNTER — Encounter: Payer: Self-pay | Admitting: Pain Medicine

## 2021-08-31 ENCOUNTER — Ambulatory Visit: Payer: 59 | Attending: Pain Medicine | Admitting: Pain Medicine

## 2021-08-31 VITALS — BP 152/96 | HR 64 | Temp 97.3°F | Resp 16 | Ht 72.0 in | Wt 252.0 lb

## 2021-08-31 DIAGNOSIS — G894 Chronic pain syndrome: Secondary | ICD-10-CM | POA: Insufficient documentation

## 2021-08-31 DIAGNOSIS — Z79899 Other long term (current) drug therapy: Secondary | ICD-10-CM | POA: Diagnosis present

## 2021-08-31 DIAGNOSIS — M546 Pain in thoracic spine: Secondary | ICD-10-CM | POA: Diagnosis not present

## 2021-08-31 DIAGNOSIS — R0781 Pleurodynia: Secondary | ICD-10-CM | POA: Insufficient documentation

## 2021-08-31 DIAGNOSIS — G8929 Other chronic pain: Secondary | ICD-10-CM | POA: Diagnosis present

## 2021-08-31 DIAGNOSIS — R1011 Right upper quadrant pain: Secondary | ICD-10-CM | POA: Diagnosis not present

## 2021-08-31 DIAGNOSIS — G588 Other specified mononeuropathies: Secondary | ICD-10-CM | POA: Diagnosis present

## 2021-08-31 DIAGNOSIS — M5442 Lumbago with sciatica, left side: Secondary | ICD-10-CM | POA: Diagnosis present

## 2021-08-31 DIAGNOSIS — Z79891 Long term (current) use of opiate analgesic: Secondary | ICD-10-CM | POA: Insufficient documentation

## 2021-08-31 DIAGNOSIS — G8912 Acute post-thoracotomy pain: Secondary | ICD-10-CM | POA: Diagnosis present

## 2021-08-31 DIAGNOSIS — M5414 Radiculopathy, thoracic region: Secondary | ICD-10-CM | POA: Insufficient documentation

## 2021-08-31 MED ORDER — OXYCODONE HCL 5 MG PO TABS: 5.0000 mg | ORAL_TABLET | Freq: Three times a day (TID) | ORAL | 0 refills | Status: DC | PRN

## 2021-08-31 NOTE — Patient Instructions (Signed)
____________________________________________________________________________________________ ? ?Medication Rules ? ?Purpose: To inform patients, and their family members, of our rules and regulations. ? ?Applies to: All patients receiving prescriptions (written or electronic). ? ?Pharmacy of record: Pharmacy where electronic prescriptions will be sent. If written prescriptions are taken to a different pharmacy, please inform the nursing staff. The pharmacy listed in the electronic medical record should be the one where you would like electronic prescriptions to be sent. ? ?Electronic prescriptions: In compliance with the [place] Strengthen Opioid Misuse Prevention (STOP) Act of 2017 (Session Law 2017-74/H243), effective June 07, 2018, all controlled substances must be electronically prescribed. Calling prescriptions to the pharmacy will cease to exist. ? ?Prescription refills: Only during scheduled appointments. Applies to all prescriptions. ? ?NOTE: The following applies primarily to controlled substances (Opioid* Pain Medications).  ? ?Type of encounter (visit): For patients receiving controlled substances, face-to-face visits are required. (Not an option or up to the patient.) ? ?Patient's responsibilities: ?Pain Pills: Bring all pain pills to every appointment (except for procedure appointments). ?Pill Bottles: Bring pills in original pharmacy bottle. Always bring the newest bottle. Bring bottle, even if empty. ?Medication refills: You are responsible for knowing and keeping track of what medications you take and those you need refilled. ?The day before your appointment: write a list of all prescriptions that need to be refilled. ?The day of the appointment: give the list to the admitting nurse. Prescriptions will be written only during appointments. No prescriptions will be written on procedure days. ?If you forget a medication: it will not be "Called in", "Faxed", or "electronically sent". You will  need to get another appointment to get these prescribed. ?No early refills. Do not call asking to have your prescription filled early. ?Prescription Accuracy: You are responsible for carefully inspecting your prescriptions before leaving our office. Have the discharge nurse carefully go over each prescription with you, before taking them home. Make sure that your name is accurately spelled, that your address is correct. Check the name and dose of your medication to make sure it is accurate. Check the number of pills, and the written instructions to make sure they are clear and accurate. Make sure that you are given enough medication to last until your next medication refill appointment. ?Taking Medication: Take medication as prescribed. When it comes to controlled substances, taking less pills or less frequently than prescribed is permitted and encouraged. ?Never take more pills than instructed. ?Never take medication more frequently than prescribed.  ?Inform other Doctors: Always inform, all of your healthcare providers, of all the medications you take. ?Pain Medication from other Providers: You are not allowed to accept any additional pain medication from any other Doctor or Healthcare provider. There are two exceptions to this rule. (see below) In the event that you require additional pain medication, you are responsible for notifying us, as stated below. ?Cough Medicine: Often these contain an opioid, such as codeine or hydrocodone. Never accept or take cough medicine containing these opioids if you are already taking an opioid* medication. The combination may cause respiratory failure and death. ?Medication Agreement: You are responsible for carefully reading and following our Medication Agreement. This must be signed before receiving any prescriptions from our practice. Safely store a copy of your signed Agreement. Violations to the Agreement will result in no further prescriptions. (Additional copies of our  Medication Agreement are available upon request.) ?Laws, Rules, & Regulations: All patients are expected to follow all Federal and State Laws, Statutes, Rules, & Regulations. Ignorance of   the Laws does not constitute a valid excuse.  ?Illegal drugs and Controlled Substances: The use of illegal substances (including, but not limited to marijuana and its derivatives) and/or the illegal use of any controlled substances is strictly prohibited. Violation of this rule may result in the immediate and permanent discontinuation of any and all prescriptions being written by our practice. The use of any illegal substances is prohibited. ?Adopted CDC guidelines & recommendations: Target dosing levels will be at or below 60 MME/day. Use of benzodiazepines** is not recommended. ? ?Exceptions: There are only two exceptions to the rule of not receiving pain medications from other Healthcare Providers. ?Exception #1 (Emergencies): In the event of an emergency (i.e.: accident requiring emergency care), you are allowed to receive additional pain medication. However, you are responsible for: As soon as you are able, call our office [phone], at any time of the day or night, and leave a message stating your name, the date and nature of the emergency, and the name and dose of the medication prescribed. In the event that your call is answered by a member of our staff, make sure to document and save the date, time, and the name of the person that took your information.  ?Exception #2 (Planned Surgery): In the event that you are scheduled by another doctor or dentist to have any type of surgery or procedure, you are allowed (for a period no longer than 30 days), to receive additional pain medication, for the acute post-op pain. However, in this case, you are responsible for picking up a copy of our "Post-op Pain Management for Surgeons" handout, and giving it to your surgeon or dentist. This document is available at our office, and  does not require an appointment to obtain it. Simply go to our office during business hours (Monday-Thursday from 8:00 AM to 4:00 PM) (Friday 8:00 AM to 12:00 Noon) or if you have a scheduled appointment with us, prior to your surgery, and ask for it by name. In addition, you are responsible for: calling our office [phone], at any time of the day or night, and leaving a message stating your name, name of your surgeon, type of surgery, and date of procedure or surgery. Failure to comply with your responsibilities may result in termination of therapy involving the controlled substances. ?Medication Agreement Violation. Following the above rules, including your responsibilities will help you in avoiding a Medication Agreement Violation (?Breaking your Pain Medication Contract?). ? ?*Opioid medications include: morphine, codeine, oxycodone, oxymorphone, hydrocodone, hydromorphone, meperidine, tramadol, tapentadol, buprenorphine, fentanyl, methadone. ?**Benzodiazepine medications include: diazepam (Valium), alprazolam (Xanax), clonazepam (Klonopine), lorazepam (Ativan), clorazepate (Tranxene), chlordiazepoxide (Librium), estazolam (Prosom), oxazepam (Serax), temazepam (Restoril), triazolam (Halcion) ?(Last updated: 03/04/2021) ?____________________________________________________________________________________________ ? ____________________________________________________________________________________________ ? ?Medication Recommendations and Reminders ? ?Applies to: All patients receiving prescriptions (written and/or electronic). ? ?Medication Rules & Regulations: These rules and regulations exist for your safety and that of others. They are not flexible and neither are we. Dismissing or ignoring them will be considered "non-compliance" with medication therapy, resulting in complete and irreversible termination of such therapy. (See document titled "Medication Rules" for more details.) In all conscience,  because of safety reasons, we cannot continue providing a therapy where the patient does not follow instructions. ? ?Pharmacy of record:  ?Definition: This is the pharmacy where your electronic prescriptions w

## 2021-08-31 NOTE — Progress Notes (Signed)
Nursing Pain Medication Assessment:  ?Safety precautions to be maintained throughout the outpatient stay will include: orient to surroundings, keep bed in low position, maintain call bell within reach at all times, provide assistance with transfer out of bed and ambulation.  ?Medication Inspection Compliance: Pill count conducted under aseptic conditions, in front of the patient. Neither the pills nor the bottle was removed from the patient's sight at any time. Once count was completed pills were immediately returned to the patient in their original bottle. ? ?Medication: Oxycodone IR ?Pill/Patch Count:  5 of 90 pills remain ?Pill/Patch Appearance: Markings consistent with prescribed medication ?Bottle Appearance: Standard pharmacy container. Clearly labeled. ?Filled Date: 2 / 14 / 2023 ?Last Medication intake:  TodaySafety precautions to be maintained throughout the outpatient stay will include: orient to surroundings, keep bed in low position, maintain call bell within reach at all times, provide assistance with transfer out of bed and ambulation.  ?

## 2021-09-28 ENCOUNTER — Telehealth: Payer: 59 | Admitting: Pain Medicine

## 2021-11-22 NOTE — Progress Notes (Unsigned)
PROVIDER NOTE: Information contained herein reflects review and annotations entered in association with encounter. Interpretation of such information and data should be left to medically-trained personnel. Information provided to patient can be located elsewhere in the medical record under "Patient Instructions". Document created using STT-dictation technology, any transcriptional errors that may result from process are unintentional.    Patient: Justin [name]  Service Category: E/M  Provider: Gaspar Cola, MD  DOB: [birth year]  DOS: 11/23/2021  Specialty: Interventional Pain Management  MRN: [id number]  Setting: Ambulatory outpatient  PCP: Jon Billings, NP  Type: Established Patient    Referring Provider: Jon Billings, NP  Location: Office  Delivery: Face-to-face     HPI  [name], a 56 y.o. year old male, is here today because of his Chronic pain syndrome [G89.4]. Mr. Oberman primary complain today is No chief complaint on file. Last encounter: My last encounter with him was on 08/31/2021. Pertinent problems: Mr. 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 Pain Assessment: Severity of   is reported as a  /10. Location:    / . Onset:  . Quality:  . Timing:  . Modifying factor(s):  Marland Kitchen Vitals:  vitals were not taken for this visit.   Reason for encounter: medication management. ***   Nonopioids transferred 03/24/2020: Turmeric, Calcium  Pharmacotherapy Assessment  Analgesic: Oxycodone IR 5 mg, 1 tab PO q 8 hrs (15 mg/day of oxycodone)  MME/day: 22.5 mg/day.    Monitoring: [place] PMP: PDMP reviewed during this encounter.       Pharmacotherapy: No side-effects or adverse reactions reported. Compliance: No problems identified. Effectiveness: Clinically acceptable.  No notes on file  UDS:  Summary  Date Value Ref Range Status  11/24/2020 Note  Final    Comment:    ==================================================================== [employer] [facility] (MW) ==================================================================== Test                             Result       Flag       Units  Drug Present and Declared for Prescription Verification   Oxycodone                      1264         EXPECTED   ng/mg creat   Oxymorphone                    377          EXPECTED   ng/mg creat   Noroxycodone                   979          EXPECTED   ng/mg creat   Noroxymorphone                 117          EXPECTED   ng/mg creat    Sources of oxycodone are scheduled prescription medications.    Oxymorphone, noroxycodone, and noroxymorphone are expected    metabolites of oxycodone. Oxymorphone is also available as  a    scheduled prescription medication.  ==================================================================== Test                      Result    Flag   Units      Ref Range   Creatinine              168              mg/dL      >=20 ==================================================================== Declared Medications:  The flagging and interpretation on this report are based on the  following declared medications.  Unexpected results may arise from  inaccuracies in the declared medications.   **Note: The testing scope of this panel includes these medications:   Oxycodone (Roxicodone)   **Note: The testing scope of this panel does not include the  following reported medications:   Acetaminophen (Tylenol)  Albuterol (Ventolin HFA)  Atorvastatin (Lipitor)  Azithromycin (Zithromax)  Benzonatate (Tessalon)  Calcium  Cyclobenzaprine  (Fexmid)  Enoxaparin (Lovenox)  Gabapentin (Neurontin)  Ibuprofen (Advil)  Vitamin D ==================================================================== For clinical consultation, please call [phone]. ====================================================================      ROS  Constitutional: Denies any fever or chills Gastrointestinal: No reported hemesis, hematochezia, vomiting, or acute GI distress Musculoskeletal: Denies any acute onset joint swelling, redness, loss of ROM, or weakness Neurological: No reported episodes of acute onset apraxia, aphasia, dysarthria, agnosia, amnesia, paralysis, loss of coordination, or loss of consciousness  Medication Review  GNP Calcium 1200, acetaminophen, albuterol, apixaban, atorvastatin, and oxyCODONE  History Review  Allergy: Mr. Hoecker has No Known Allergies. Drug: Mr. Colucci  reports no history of drug use. Alcohol:  reports no history of alcohol use. Tobacco:  reports that he does not have a smoking history on file. He has never used smokeless tobacco. Social: Mr. Okey  reports that he does not have a smoking history on file. He has never used smokeless tobacco. He reports that he does not drink alcohol and does not use drugs. Medical:  has a past medical history of Arm vein blood clot, Coronary artery disease, DVT (deep venous thrombosis) ([place]) (2012; January 2016), GERD (gastroesophageal reflux disease), Rib pain on right side (08/04/2016), and Vascular thoracic outlet syndrome. Surgical: Mr. Hammers  has a past surgical history that includes Knee cartilage surgery; Carotid angiogram; Scalenotomy w/o resection cervical rib (2012); Right upper extremity thrombectomy (2012); and Colonoscopy with propofol (N/A, 10/10/2019). Family: family history includes Breast cancer (age of onset: 24) in his father; Cancer in his maternal grandmother; Diabetes in his father; Heart attack in his father; Heart disease in his father; Leukemia in  his maternal grandfather; Prostate cancer in his maternal uncle.  Laboratory Chemistry Profile   Renal Lab Results  Component Value Date   BUN 10 07/30/2021   CREATININE 1.27 07/30/2021   BCR 8 (L) 07/30/2021   GFRAA >60 02/19/2020   GFRNONAA 58 (L) 06/11/2021    Hepatic Lab Results  Component Value Date   AST 21 07/30/2021   ALT 31 07/30/2021   ALBUMIN 4.2 07/30/2021   ALKPHOS 98 07/30/2021   LIPASE 90 03/02/2014    Electrolytes Lab Results  Component Value Date   NA 141 07/30/2021   K 4.5 07/30/2021   CL 100 07/30/2021   CALCIUM 10.0 07/30/2021   MG 2.0 07/28/2017    Bone Lab Results  Component Value Date   VD25OH 43.0 07/30/2021   25OHVITD1 15 (L) 06/29/2018   25OHVITD2 8.4 06/29/2018   25OHVITD3 6.8 06/29/2018  Inflammation (CRP: Acute Phase) (ESR: Chronic Phase) Lab Results  Component Value Date   CRP 12 (H) 06/29/2018   ESRSEDRATE 27 06/29/2018   LATICACIDVEN 0.9 07/20/2016         Note: Above Lab results reviewed.  Recent Imaging Review  US Venous Img Upper Uni Right(DVT) CLINICAL DATA:  56 year old male with a history of upper extremity thrombosis, new right arm swelling  EXAM: RIGHT UPPER EXTREMITY VENOUS DOPPLER ULTRASOUND  TECHNIQUE: Gray-scale sonography with graded compression, as well as color Doppler and duplex ultrasound were performed to evaluate the upper extremity deep venous system from the level of the subclavian vein and including the jugular, axillary, basilic, radial, ulnar and upper cephalic vein. Spectral Doppler was utilized to evaluate flow at rest and with distal augmentation maneuvers.  COMPARISON:  None.  FINDINGS: Contralateral Subclavian Vein: Respiratory phasicity is normal and symmetric with the symptomatic side. No evidence of thrombus. Normal compressibility.  Internal Jugular Vein: No evidence of thrombus. Normal compressibility, respiratory phasicity and response to augmentation.  Subclavian Vein: No  evidence of thrombus. Normal compressibility, respiratory phasicity and response to augmentation.  Axillary Vein: No evidence of thrombus. Normal compressibility, respiratory phasicity and response to augmentation.  Cephalic Vein: No evidence of thrombus. Normal compressibility, respiratory phasicity and response to augmentation.  Basilic Vein: No evidence of thrombus. Normal compressibility, respiratory phasicity and response to augmentation.  Brachial Veins: No evidence of thrombus. Normal compressibility, respiratory phasicity and response to augmentation.  Radial Veins: No evidence of thrombus. Normal compressibility, respiratory phasicity and response to augmentation.  Ulnar Veins: No evidence of thrombus. Normal compressibility, respiratory phasicity and response to augmentation.  Other Findings:  None visualized.  IMPRESSION: Sonographic survey of the right upper extremity negative for DVT  Electronically Signed   By: Corrie Mckusick D.O.   On: 01/25/2020 15:10 Note: Reviewed        Physical Exam  General appearance: Well nourished, well developed, and well hydrated. In no apparent acute distress Mental status: Alert, oriented x 3 (person, place, & time)       Respiratory: No evidence of acute respiratory distress Eyes: PERLA Vitals: There were no vitals taken for this visit. BMI: Estimated body mass index is 34.18 kg/m as calculated from the following:   Height as of 08/31/21: 6' (1.829 m).   Weight as of 08/31/21: 252 lb (114.3 kg). Ideal: Patient weight not recorded  Assessment   Diagnosis Status  1. Chronic pain syndrome   2. Chronic thoracic back pain (1ry area of Pain) (Right)   3. Thoracic radiculitis (2ry area of Pain)   4. Right upper quadrant abdominal pain (3ry area of Pain)   5. Rib pain on right side (4th area of Pain)   6. 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 Intercostal neuralgia (T6, T7) (Right)   8. Chronic low  back pain (Left) with sciatica (Left)   9. Pharmacologic therapy   10. Chronic use of opiate for therapeutic purpose   11. Encounter for medication management   12. Encounter for chronic pain management    Controlled Controlled Controlled   Updated Problems: No problems updated.  Plan of Care  Problem-specific:  No problem-specific Assessment & Plan notes found for this encounter.  Mr. QUINTERRIUS ERRINGTON has a current medication list which includes the following long-term medication(s): albuterol, apixaban, atorvastatin, gnp calcium 1200, and oxycodone.  Pharmacotherapy (Medications Ordered): No orders of the defined types were placed in this encounter.  Orders:  No orders of the defined types were placed in this encounter.  Follow-up plan:   No follow-ups on file.     Interventional Therapies  Risk  Complexity Considerations:   Estimated body mass index is 34.18 kg/m as calculated from the following:   Height as of this encounter: 6' (1.829 m).   Weight as of this encounter: 252 lb (114.3 kg). WNL   Planned  Pending:      Under consideration:   Diagnostic right thoracic SNRB  Diagnostic right thoracic facet block  Possible right thoracic facet medial branch RFA    Completed:   Palliative right T6, T7, T8, & T9 intercostal NB x2 (10/11/2017) (100/100/50/50)  Palliative right T6, T7, T8, & T9 intercostal nerve RFA x1 (12/29/17)    Therapeutic  Palliative (PRN) options:   Palliative right T6, T7, T8, & T9 intercostal NB #3  Palliative right T6, T7, T8, & T9 intercostal nerve RFA #2     Recent Visits Date Type Provider Dept  08/31/21 Office Visit Milinda Pointer, MD [facility]  Showing recent visits within past 90 days and meeting all other requirements Future Appointments Date Type Provider Dept  11/23/21 Appointment Milinda Pointer, MD [facility]  Showing future appointments within next 90 days and meeting all other  requirements  I discussed the assessment and treatment plan with the patient. The patient was provided an opportunity to ask questions and all were answered. The patient agreed with the plan and demonstrated an understanding of the instructions.  Patient advised to call back or seek an in-person evaluation if the symptoms or condition worsens.  Duration of encounter: *** minutes.  Note by: Gaspar Cola, MD Date: 11/23/2021; Time: 11:15 AM

## 2021-11-23 ENCOUNTER — Ambulatory Visit: Payer: BC Managed Care – PPO | Attending: Pain Medicine | Admitting: Pain Medicine

## 2021-11-23 ENCOUNTER — Encounter: Payer: Self-pay | Admitting: Pain Medicine

## 2021-11-23 ENCOUNTER — Other Ambulatory Visit: Payer: Self-pay | Admitting: Pain Medicine

## 2021-11-23 VITALS — BP 126/72 | HR 63 | Temp 97.3°F | Resp 16 | Ht 72.0 in | Wt 240.0 lb

## 2021-11-23 DIAGNOSIS — M5414 Radiculopathy, thoracic region: Secondary | ICD-10-CM | POA: Insufficient documentation

## 2021-11-23 DIAGNOSIS — G8929 Other chronic pain: Secondary | ICD-10-CM | POA: Diagnosis not present

## 2021-11-23 DIAGNOSIS — R1011 Right upper quadrant pain: Secondary | ICD-10-CM | POA: Diagnosis not present

## 2021-11-23 DIAGNOSIS — Z79899 Other long term (current) drug therapy: Secondary | ICD-10-CM | POA: Insufficient documentation

## 2021-11-23 DIAGNOSIS — G8912 Acute post-thoracotomy pain: Secondary | ICD-10-CM | POA: Insufficient documentation

## 2021-11-23 DIAGNOSIS — R0781 Pleurodynia: Secondary | ICD-10-CM | POA: Diagnosis not present

## 2021-11-23 DIAGNOSIS — G588 Other specified mononeuropathies: Secondary | ICD-10-CM | POA: Diagnosis not present

## 2021-11-23 DIAGNOSIS — Z79891 Long term (current) use of opiate analgesic: Secondary | ICD-10-CM | POA: Diagnosis not present

## 2021-11-23 DIAGNOSIS — G894 Chronic pain syndrome: Secondary | ICD-10-CM | POA: Diagnosis not present

## 2021-11-23 DIAGNOSIS — M546 Pain in thoracic spine: Secondary | ICD-10-CM | POA: Insufficient documentation

## 2021-11-23 DIAGNOSIS — M5442 Lumbago with sciatica, left side: Secondary | ICD-10-CM | POA: Diagnosis not present

## 2021-11-23 MED ORDER — OXYCODONE HCL 5 MG PO TABS: 5.0000 mg | ORAL_TABLET | Freq: Three times a day (TID) | ORAL | 0 refills | Status: DC | PRN

## 2021-11-23 NOTE — Progress Notes (Signed)
Nursing Pain Medication Assessment:  Safety precautions to be maintained throughout the outpatient stay will include: orient to surroundings, keep bed in low position, maintain call bell within reach at all times, provide assistance with transfer out of bed and ambulation.  Medication Inspection Compliance: Pill count conducted under aseptic conditions, in front of the patient. Neither the pills nor the bottle was removed from the patient's sight at any time. Once count was completed pills were immediately returned to the patient in their original bottle.  Medication: Oxycodone IR Pill/Patch Count:  31 of 90 pills remain Pill/Patch Appearance: Markings consistent with prescribed medication Bottle Appearance: Standard pharmacy container. Clearly labeled. Filled Date: 05 / 29 / 2023 Last Medication intake:  Today

## 2021-11-23 NOTE — Patient Instructions (Signed)
____________________________________________________________________________________________  Medication Rules  Purpose: To inform patients, and their family members, of our rules and regulations.  Applies to: All patients receiving prescriptions (written or electronic).  Pharmacy of record: Pharmacy where electronic prescriptions will be sent. If written prescriptions are taken to a different pharmacy, please inform the nursing staff. The pharmacy listed in the electronic medical record should be the one where you would like electronic prescriptions to be sent.  Electronic prescriptions: In compliance with the [place] Strengthen Opioid Misuse Prevention (STOP) Act of 2017 (Session Law 2017-74/H243), effective June 07, 2018, all controlled substances must be electronically prescribed. Calling prescriptions to the pharmacy will cease to exist.  Prescription refills: Only during scheduled appointments. Applies to all prescriptions.  NOTE: The following applies primarily to controlled substances (Opioid* Pain Medications).   Type of encounter (visit): For patients receiving controlled substances, face-to-face visits are required. (Not an option or up to the patient.)  Patient's responsibilities: Pain Pills: Bring all pain pills to every appointment (except for procedure appointments). Pill Bottles: Bring pills in original pharmacy bottle. Always bring the newest bottle. Bring bottle, even if empty. Medication refills: You are responsible for knowing and keeping track of what medications you take and those you need refilled. The day before your appointment: write a list of all prescriptions that need to be refilled. The day of the appointment: give the list to the admitting nurse. Prescriptions will be written only during appointments. No prescriptions will be written on procedure days. If you forget a medication: it will not be "Called in", "Faxed", or "electronically sent". You will  need to get another appointment to get these prescribed. No early refills. Do not call asking to have your prescription filled early. Prescription Accuracy: You are responsible for carefully inspecting your prescriptions before leaving our office. Have the discharge nurse carefully go over each prescription with you, before taking them home. Make sure that your name is accurately spelled, that your address is correct. Check the name and dose of your medication to make sure it is accurate. Check the number of pills, and the written instructions to make sure they are clear and accurate. Make sure that you are given enough medication to last until your next medication refill appointment. Taking Medication: Take medication as prescribed. When it comes to controlled substances, taking less pills or less frequently than prescribed is permitted and encouraged. Never take more pills than instructed. Never take medication more frequently than prescribed.  Inform other Doctors: Always inform, all of your healthcare providers, of all the medications you take. Pain Medication from other Providers: You are not allowed to accept any additional pain medication from any other Doctor or Healthcare provider. There are two exceptions to this rule. (see below) In the event that you require additional pain medication, you are responsible for notifying us, as stated below. Cough Medicine: Often these contain an opioid, such as codeine or hydrocodone. Never accept or take cough medicine containing these opioids if you are already taking an opioid* medication. The combination may cause respiratory failure and death. Medication Agreement: You are responsible for carefully reading and following our Medication Agreement. This must be signed before receiving any prescriptions from our practice. Safely store a copy of your signed Agreement. Violations to the Agreement will result in no further prescriptions. (Additional copies of our  Medication Agreement are available upon request.) Laws, Rules, & Regulations: All patients are expected to follow all Federal and State Laws, Statutes, Rules, & Regulations. Ignorance of   the Laws does not constitute a valid excuse.  Illegal drugs and Controlled Substances: The use of illegal substances (including, but not limited to marijuana and its derivatives) and/or the illegal use of any controlled substances is strictly prohibited. Violation of this rule may result in the immediate and permanent discontinuation of any and all prescriptions being written by our practice. The use of any illegal substances is prohibited. Adopted CDC guidelines & recommendations: Target dosing levels will be at or below 60 MME/day. Use of benzodiazepines** is not recommended.  Exceptions: There are only two exceptions to the rule of not receiving pain medications from other Healthcare Providers. Exception #1 (Emergencies): In the event of an emergency (i.e.: accident requiring emergency care), you are allowed to receive additional pain medication. However, you are responsible for: As soon as you are able, call our office [phone], at any time of the day or night, and leave a message stating your name, the date and nature of the emergency, and the name and dose of the medication prescribed. In the event that your call is answered by a member of our staff, make sure to document and save the date, time, and the name of the person that took your information.  Exception #2 (Planned Surgery): In the event that you are scheduled by another doctor or dentist to have any type of surgery or procedure, you are allowed (for a period no longer than 30 days), to receive additional pain medication, for the acute post-op pain. However, in this case, you are responsible for picking up a copy of our "Post-op Pain Management for Surgeons" handout, and giving it to your surgeon or dentist. This document is available at our office, and  does not require an appointment to obtain it. Simply go to our office during business hours (Monday-Thursday from 8:00 AM to 4:00 PM) (Friday 8:00 AM to 12:00 Noon) or if you have a scheduled appointment with us, prior to your surgery, and ask for it by name. In addition, you are responsible for: calling our office [phone], at any time of the day or night, and leaving a message stating your name, name of your surgeon, type of surgery, and date of procedure or surgery. Failure to comply with your responsibilities may result in termination of therapy involving the controlled substances. Medication Agreement Violation. Following the above rules, including your responsibilities will help you in avoiding a Medication Agreement Violation ("Breaking your Pain Medication Contract").  *Opioid medications include: morphine, codeine, oxycodone, oxymorphone, hydrocodone, hydromorphone, meperidine, tramadol, tapentadol, buprenorphine, fentanyl, methadone. **Benzodiazepine medications include: diazepam (Valium), alprazolam (Xanax), clonazepam (Klonopine), lorazepam (Ativan), clorazepate (Tranxene), chlordiazepoxide (Librium), estazolam (Prosom), oxazepam (Serax), temazepam (Restoril), triazolam (Halcion) (Last updated: 03/04/2021) ____________________________________________________________________________________________  ____________________________________________________________________________________________  Medication Recommendations and Reminders  Applies to: All patients receiving prescriptions (written and/or electronic).  Medication Rules & Regulations: These rules and regulations exist for your safety and that of others. They are not flexible and neither are we. Dismissing or ignoring them will be considered "non-compliance" with medication therapy, resulting in complete and irreversible termination of such therapy. (See document titled "Medication Rules" for more details.) In all conscience,  because of safety reasons, we cannot continue providing a therapy where the patient does not follow instructions.  Pharmacy of record:  Definition: This is the pharmacy where your electronic prescriptions will be sent.  We do not endorse any particular pharmacy, however, we have experienced problems with [facility] not securing enough medication supply for the community. We do not restrict you   in your choice of pharmacy. However, once we write for your prescriptions, we will NOT be re-sending more prescriptions to fix restricted supply problems created by your pharmacy, or your insurance.  The pharmacy listed in the electronic medical record should be the one where you want electronic prescriptions to be sent. If you choose to change pharmacy, simply notify our nursing staff.  Recommendations: Keep all of your pain medications in a safe place, under lock and key, even if you live alone. We will NOT replace lost, stolen, or damaged medication. After you fill your prescription, take 1 week's worth of pills and put them away in a safe place. You should keep a separate, properly labeled bottle for this purpose. The remainder should be kept in the original bottle. Use this as your primary supply, until it runs out. Once it's gone, then you know that you have 1 week's worth of medicine, and it is time to come in for a prescription refill. If you do this correctly, it is unlikely that you will ever run out of medicine. To make sure that the above recommendation works, it is very important that you make sure your medication refill appointments are scheduled at least 1 week before you run out of medicine. To do this in an effective manner, make sure that you do not leave the office without scheduling your next medication management appointment. Always ask the nursing staff to show you in your prescription , when your medication will be running out. Then arrange for the receptionist to get you a return appointment,  at least 7 days before you run out of medicine. Do not wait until you have 1 or 2 pills left, to come in. This is very poor planning and does not take into consideration that we may need to cancel appointments due to bad weather, sickness, or emergencies affecting our staff. DO NOT ACCEPT A "Partial Fill": If for any reason your pharmacy does not have enough pills/tablets to completely fill or refill your prescription, do not allow for a "partial fill". The law allows the pharmacy to complete that prescription within 72 hours, without requiring a new prescription. If they do not fill the rest of your prescription within those 72 hours, you will need a separate prescription to fill the remaining amount, which we will NOT provide. If the reason for the partial fill is your insurance, you will need to talk to the pharmacist about payment alternatives for the remaining tablets, but again, DO NOT ACCEPT A PARTIAL FILL, unless you can trust your pharmacist to obtain the remainder of the pills within 72 hours.  Prescription refills and/or changes in medication(s):  Prescription refills, and/or changes in dose or medication, will be conducted only during scheduled medication management appointments. (Applies to both, written and electronic prescriptions.) No refills on procedure days. No medication will be changed or started on procedure days. No changes, adjustments, and/or refills will be conducted on a procedure day. Doing so will interfere with the diagnostic portion of the procedure. No phone refills. No medications will be "called into the pharmacy". No Fax refills. No weekend refills. No Holliday refills. No after hours refills.  Remember:  Business hours are:  Monday to Thursday 8:00 AM to 4:00 PM Provider's Schedule: Temple Ewart, MD - Appointments are:  Medication management: Monday and Wednesday 8:00 AM to 4:00 PM Procedure day: Tuesday and Thursday 7:30 AM to 4:00 PM Bilal Lateef, MD -  Appointments are:  Medication management: Tuesday and Thursday 8:00   AM to 4:00 PM Procedure day: Monday and Wednesday 7:30 AM to 4:00 PM (Last update: 12/26/2019) ____________________________________________________________________________________________  ____________________________________________________________________________________________  CBD (cannabidiol) & Delta-8 (Delta-8 tetrahydrocannabinol) WARNING  Intro: Cannabidiol (CBD) and tetrahydrocannabinol (THC), are two natural compounds found in plants of the Cannabis genus. They can both be extracted from hemp or cannabis. Hemp and cannabis come from the Cannabis sativa plant. Both compounds interact with your body's endocannabinoid system, but they have very different effects. CBD does not produce the high sensation associated with cannabis. Delta-8 tetrahydrocannabinol, also known as delta-8 THC, is a psychoactive substance found in the Cannabis sativa plant, of which marijuana and hemp are two varieties. THC is responsible for the high associated with the illicit use of marijuana.  Applicable to: All individuals currently taking or considering taking CBD (cannabidiol) and, more important, all patients taking opioid analgesic controlled substances (pain medication). (Example: oxycodone; oxymorphone; hydrocodone; hydromorphone; morphine; methadone; tramadol; tapentadol; fentanyl; buprenorphine; butorphanol; dextromethorphan; meperidine; codeine; etc.)  Legal status: CBD remains a Schedule I drug prohibited for any use. CBD is illegal with one exception. In the [place], CBD has a limited Food and Drug Administration (FDA) approval for the treatment of two specific types of epilepsy disorders. Only one CBD product has been approved by the FDA for this purpose: "Epidiolex". FDA is aware that some companies are marketing products containing cannabis and cannabis-derived compounds in ways that violate the Federal Food, Drug and Cosmetic Act  (FD&C Act) and that may put the health and safety of consumers at risk. The FDA, a Federal agency, has not enforced the CBD status since 2018. UPDATE: (07/24/2021) The Drug Enforcement Agency (DEA) issued a letter stating that "delta" cannabinoids, including Delta-8-THCO and Delta-9-THCO, synthetically derived from hemp do not qualify as hemp and will be viewed as Schedule I drugs. (Schedule I drugs, substances, or chemicals are defined as drugs with no currently accepted medical use and a high potential for abuse. Some examples of Schedule I drugs are: heroin, lysergic acid diethylamide (LSD), marijuana (cannabis), 3,4-methylenedioxymethamphetamine (ecstasy), methaqualone, and peyote.) (https://www.dea.gov)  Legality: Some manufacturers ship CBD products nationally, which is illegal. Often such products are sold online and are therefore available throughout the country. CBD is openly sold in head shops and health food stores in some states where such sales have not been explicitly legalized. Selling unapproved products with unsubstantiated therapeutic claims is not only a violation of the law, but also can put patients at risk, as these products have not been proven to be safe or effective. Federal illegality makes it difficult to conduct research on CBD.  Reference: "FDA Regulation of Cannabis and Cannabis-Derived Products, Including Cannabidiol (CBD)" - https://www.fda.gov/news-events/public-health-focus/fda-regulation-cannabis-and-cannabis-derived-products-including-cannabidiol-cbd  Warning: CBD is not FDA approved and has not undergo the same manufacturing controls as prescription drugs.  This means that the purity and safety of available CBD may be questionable. Most of the time, despite manufacturer's claims, it is contaminated with THC (delta-9-tetrahydrocannabinol - the chemical in marijuana responsible for the "HIGH").  When this is the case, the THC contaminant will trigger a positive urine drug  screen (UDS) test for Marijuana (carboxy-THC). Because a positive UDS for any illicit substance is a violation of our medication agreement, your opioid analgesics (pain medicine) may be permanently discontinued. The FDA recently put out a warning about 5 things that everyone should be aware of regarding Delta-8 THC: Delta-8 THC products have not been evaluated or approved by the FDA for safe use and may be marketed in ways that put the   public health at risk. The FDA has received adverse event reports involving delta-8 THC-containing products. Delta-8 THC has psychoactive and intoxicating effects. Delta-8 THC manufacturing often involve use of potentially harmful chemicals to create the concentrations of delta-8 THC claimed in the marketplace. The final delta-8 THC product may have potentially harmful by-products (contaminants) due to the chemicals used in the process. Manufacturing of delta-8 THC products may occur in uncontrolled or unsanitary settings, which may lead to the presence of unsafe contaminants or other potentially harmful substances. Delta-8 THC products should be kept out of the reach of children and pets.  MORE ABOUT CBD  General Information: CBD was discovered in 1940 and it is a derivative of the cannabis sativa genus plants (Marijuana and Hemp). It is one of the 113 identified substances found in Marijuana. It accounts for up to 40% of the plant's extract. As of 2018, preliminary clinical studies on CBD included research for the treatment of anxiety, movement disorders, and pain. CBD is available and consumed in multiple forms, including inhalation of smoke or vapor, as an aerosol spray, and by mouth. It may be supplied as an oil containing CBD, capsules, dried cannabis, or as a liquid solution. CBD is thought not to be as psychoactive as THC (delta-9-tetrahydrocannabinol - the chemical in marijuana responsible for the "HIGH"). Studies suggest that CBD may interact with different  biological target receptors in the body, including cannabinoid and other neurotransmitter receptors. As of 2018 the mechanism of action for its biological effects has not been determined.  Side-effects  Adverse reactions: Dry mouth, diarrhea, decreased appetite, fatigue, drowsiness, malaise, weakness, sleep disturbances, and others.  Drug interactions: CBC may interact with other medications such as blood-thinners. Because CBD causes drowsiness on its own, it also increases the drowsiness caused by other medications, including antihistamines (such as Benadryl), benzodiazepines (Xanax, Ativan, Valium), antipsychotics, antidepressants and opioids, as well as alcohol and supplements such as kava, melatonin and St. John's Wort. Be cautious with the following combinations:   Brivaracetam (Briviact) Brivaracetam is changed and broken down by the body. CBD might decrease how quickly the body breaks down brivaracetam. This might increase levels of brivaracetam in the body.  Caffeine Caffeine is changed and broken down by the body. CBD might decrease how quickly the body breaks down caffeine. This might increase levels of caffeine in the body.  Carbamazepine (Tegretol) Carbamazepine is changed and broken down by the body. CBD might decrease how quickly the body breaks down carbamazepine. This might increase levels of carbamazepine in the body and increase its side effects.  Citalopram (Celexa) Citalopram is changed and broken down by the body. CBD might decrease how quickly the body breaks down citalopram. This might increase levels of citalopram in the body and increase its side effects.  Clobazam (Onfi) Clobazam is changed and broken down by the liver. CBD might decrease how quickly the liver breaks down clobazam. This might increase the effects and side effects of clobazam.  Eslicarbazepine (Aptiom) Eslicarbazepine is changed and broken down by the body. CBD might decrease how quickly the body  breaks down eslicarbazepine. This might increase levels of eslicarbazepine in the body by a small amount.  Everolimus (Zostress) Everolimus is changed and broken down by the body. CBD might decrease how quickly the body breaks down everolimus. This might increase levels of everolimus in the body.  Lithium Taking higher doses of CBD might increase levels of lithium. This can increase the risk of lithium toxicity.  Medications changed by the liver (  Cytochrome P450 1A1 (CYP1A1) substrates) Some medications are changed and broken down by the liver. CBD might change how quickly the liver breaks down these medications. This could change the effects and side effects of these medications.  Medications changed by the liver (Cytochrome P450 1A2 (CYP1A2) substrates) Some medications are changed and broken down by the liver. CBD might change how quickly the liver breaks down these medications. This could change the effects and side effects of these medications.  Medications changed by the liver (Cytochrome P450 1B1 (CYP1B1) substrates) Some medications are changed and broken down by the liver. CBD might change how quickly the liver breaks down these medications. This could change the effects and side effects of these medications.  Medications changed by the liver (Cytochrome P450 2A6 (CYP2A6) substrates) Some medications are changed and broken down by the liver. CBD might change how quickly the liver breaks down these medications. This could change the effects and side effects of these medications.  Medications changed by the liver (Cytochrome P450 2B6 (CYP2B6) substrates) Some medications are changed and broken down by the liver. CBD might change how quickly the liver breaks down these medications. This could change the effects and side effects of these medications.  Medications changed by the liver (Cytochrome P450 2C19 (CYP2C19) substrates) Some medications are changed and broken down by the liver.  CBD might change how quickly the liver breaks down these medications. This could change the effects and side effects of these medications.  Medications changed by the liver (Cytochrome P450 2C8 (CYP2C8) substrates) Some medications are changed and broken down by the liver. CBD might change how quickly the liver breaks down these medications. This could change the effects and side effects of these medications.  Medications changed by the liver (Cytochrome P450 2C9 (CYP2C9) substrates) Some medications are changed and broken down by the liver. CBD might change how quickly the liver breaks down these medications. This could change the effects and side effects of these medications.  Medications changed by the liver (Cytochrome P450 2D6 (CYP2D6) substrates) Some medications are changed and broken down by the liver. CBD might change how quickly the liver breaks down these medications. This could change the effects and side effects of these medications.  Medications changed by the liver (Cytochrome P450 2E1 (CYP2E1) substrates) Some medications are changed and broken down by the liver. CBD might change how quickly the liver breaks down these medications. This could change the effects and side effects of these medications.  Medications changed by the liver (Cytochrome P450 3A4 (CYP3A4) substrates) Some medications are changed and broken down by the liver. CBD might change how quickly the liver breaks down these medications. This could change the effects and side effects of these medications.  Medications changed by the liver (Glucuronidated drugs) Some medications are changed and broken down by the liver. CBD might change how quickly the liver breaks down these medications. This could change the effects and side effects of these medications.  Medications that decrease the breakdown of other medications by the liver (Cytochrome P450 2C19 (CYP2C19) inhibitors) CBD is changed and broken down by the liver.  Some drugs decrease how quickly the liver changes and breaks down CBD. This could change the effects and side effects of CBD.  Medications that decrease the breakdown of other medications in the liver (Cytochrome P450 3A4 (CYP3A4) inhibitors) CBD is changed and broken down by the liver. Some drugs decrease how quickly the liver changes and breaks down CBD. This could change the effects   and side effects of CBD.  Medications that increase breakdown of other medications by the liver (Cytochrome P450 3A4 (CYP3A4) inducers) CBD is changed and broken down by the liver. Some drugs increase how quickly the liver changes and breaks down CBD. This could change the effects and side effects of CBD.  Medications that increase the breakdown of other medications by the liver (Cytochrome P450 2C19 (CYP2C19) inducers) CBD is changed and broken down by the liver. Some drugs increase how quickly the liver changes and breaks down CBD. This could change the effects and side effects of CBD.  Methadone (Dolophine) Methadone is broken down by the liver. CBD might decrease how quickly the liver breaks down methadone. Taking cannabidiol along with methadone might increase the effects and side effects of methadone.  Rufinamide (Banzel) Rufinamide is changed and broken down by the body. CBD might decrease how quickly the body breaks down rufinamide. This might increase levels of rufinamide in the body by a small amount.  Sedative medications (CNS depressants) CBD might cause sleepiness and slowed breathing. Some medications, called sedatives, can also cause sleepiness and slowed breathing. Taking CBD with sedative medications might cause breathing problems and/or too much sleepiness.  Sirolimus (Rapamune) Sirolimus is changed and broken down by the body. CBD might decrease how quickly the body breaks down sirolimus. This might increase levels of sirolimus in the body.  Stiripentol (Diacomit) Stiripentol is changed and  broken down by the body. CBD might decrease how quickly the body breaks down stiripentol. This might increase levels of stiripentol in the body and increase its side effects.  Tacrolimus (Prograf) Tacrolimus is changed and broken down by the body. CBD might decrease how quickly the body breaks down tacrolimus. This might increase levels of tacrolimus in the body.  Tamoxifen (Soltamox) Tamoxifen is changed and broken down by the body. CBD might affect how quickly the body breaks down tamoxifen. This might affect levels of tamoxifen in the body.  Topiramate (Topamax) Topiramate is changed and broken down by the body. CBD might decrease how quickly the body breaks down topiramate. This might increase levels of topiramate in the body by a small amount.  Valproate Valproic acid can cause liver injury. Taking cannabidiol with valproic acid might increase the chance of liver injury. CBD and/or valproic acid might need to be stopped, or the dose might need to be reduced.  Warfarin (Coumadin) CBD might increase levels of warfarin, which can increase the risk for bleeding. CBD and/or warfarin might need to be stopped, or the dose might need to be reduced.  Zonisamide Zonisamide is changed and broken down by the body. CBD might decrease how quickly the body breaks down zonisamide. This might increase levels of zonisamide in the body by a small amount. (Last update: 08/05/2021) ____________________________________________________________________________________________  ____________________________________________________________________________________________  Drug Holidays (Slow)  What is a "Drug Holiday"? Drug Holiday: is the name given to the period of time during which a patient stops taking a medication(s) for the purpose of eliminating tolerance to the drug.  Benefits Improved effectiveness of opioids. Decreased opioid dose needed to achieve benefits. Improved pain with lesser  dose.  What is tolerance? Tolerance: is the progressive decreased in effectiveness of a drug due to its repetitive use. With repetitive use, the body gets use to the medication and as a consequence, it loses its effectiveness. This is a common problem seen with opioid pain medications. As a result, a larger dose of the drug is needed to achieve the same effect that   used to be obtained with a smaller dose.  How long should a "Drug Holiday" last? You should stay off of the pain medicine for at least 14 consecutive days. (2 weeks)  Should I stop the medicine "cold turkey"? No. You should always coordinate with your Pain Specialist so that he/she can provide you with the correct medication dose to make the transition as smoothly as possible.  How do I stop the medicine? Slowly. You will be instructed to decrease the daily amount of pills that you take by one (1) pill every seven (7) days. This is called a "slow downward taper" of your dose. For example: if you normally take four (4) pills per day, you will be asked to drop this dose to three (3) pills per day for seven (7) days, then to two (2) pills per day for seven (7) days, then to one (1) per day for seven (7) days, and at the end of those last seven (7) days, this is when the "Drug Holiday" would start.   Will I have withdrawals? By doing a "slow downward taper" like this one, it is unlikely that you will experience any significant withdrawal symptoms. Typically, what triggers withdrawals is the sudden stop of a high dose opioid therapy. Withdrawals can usually be avoided by slowly decreasing the dose over a prolonged period of time. If you do not follow these instructions and decide to stop your medication abruptly, withdrawals may be possible.  What are withdrawals? Withdrawals: refers to the wide range of symptoms that occur after stopping or dramatically reducing opiate drugs after heavy and prolonged use. Withdrawal symptoms do not occur to  patients that use low dose opioids, or those who take the medication sporadically. Contrary to benzodiazepine (example: Valium, Xanax, etc.) or alcohol withdrawals ("Delirium Tremens"), opioid withdrawals are not lethal. Withdrawals are the physical manifestation of the body getting rid of the excess receptors.  Expected Symptoms Early symptoms of withdrawal may include: Agitation Anxiety Muscle aches Increased tearing Insomnia Runny nose Sweating Yawning  Late symptoms of withdrawal may include: Abdominal cramping Diarrhea Dilated pupils Goose bumps Nausea Vomiting  Will I experience withdrawals? Due to the slow nature of the taper, it is very unlikely that you will experience any.  What is a slow taper? Taper: refers to the gradual decrease in dose.  (Last update: 12/26/2019) ____________________________________________________________________________________________   ____________________________________________________________________________________________  Pharmacy Shortages of Pain Medication   Introduction Shockingly as it may seem, .  "No U.S[place] Supreme Court decision has ever interpreted the Constitution as guaranteeing a right to health care for all Americans." - https://www.healthequityandpolicylab.com/elusive-right-to-health-care-under-us-law  "With respect to human rights, the [place] has no formally codified right to health, nor does it participate in a human rights treaty that specifies a right to health." - Scott J. Schweikart, JD, MBE  Situation By now, most of our patients have had the experience of being told by their pharmacist that they do not have enough medication to cover their prescription. If you have not had this experience, just know that you soon will.  Problem There appears to be a shortage of these medications, either at the national level or locally. This is happening with all pharmacies. When there is not enough medication, patients  are offered a partial fill and they are told that they will try to get the rest of the medicine for them at a later time. If they do not have enough for even a partial fill, the pharmacists are telling the patients to   call us (the prescribing physicians) to request that we send another prescription to another pharmacy to get the medicine.   This reordering of a controlled substance creates documentation problems where additional paperwork needs to be created to explain why two prescriptions for the same period of time and the same medicine are being prescribed to the same patient. It also creates situations where the last appointment note does not accurately reflect when and what prescriptions were given to a patient. This leads to prescribing errors down the line, in subsequent follow-up visits.   [place] Board of Pharmacy ([place]BOP) Research revealed that Board of Pharmacy Rule .1806 (21 NCAC 46.1806) authorizes pharmacists to the transfer of prescriptions among pharmacies, and it sets forth procedural and recordkeeping requirements for doing so. However, this requires the pharmacist to complete the previously mentioned procedural paperwork to accomplish the transfer. As it turns out, it is much easier for them to have the prescribing physicians do the work.   Possible solutions 1. Have the [place] Assembly add a provision to the "STOP ACT" (the law that mandates how controlled substances are prescribed) where there is an exception to the electronic prescribing rule that states that in the event there are shortages of medications the physicians are allowed to use written prescriptions as opposed to electronic ones. This would allow patients to take their prescriptions to a different pharmacy that may have enough medication available to fill the prescription. The problem is that currently there is a law that does not allow for written prescriptions, with the exception of instances where the  electronic medical record is down due to technical issues.  2. Have [place] Congress ease the pressure on pharmaceutical companies, allowing them to produce enough quantities of the medication to adequately supply the population. 3. Have pharmacies keep enough stocks of these medications to cover their client base.  4. Have the [place] State Assembly add a provision to the "STOP ACT" where they ease the regulations surrounding the transfer of controlled substances between pharmacies, so as to simplify the transfer of supplies. As an alternative, develop a system to allow patients to obtain the remainder of their prescription at another one of their pharmacies or at an associate pharmacy.   How this shortage will affect you.  The one thing that is abundantly clear is that this is a pharmacy supply problem  and not a prescriber problem. The job of the prescriber is to evaluate and monitor the patients for the appropriate indications to the use of these medicines, the monitoring of their use and the prescribing of the appropriate dose and regimen. It is not the job of the prescriber to provide or dispense the actual medication. By law, this is the job of the pharmacies and pharmacists. It is certainly not the job of the prescriber to solve the supply problems.   Due to the above problems we are no longer taking patients to write for their pain medication. We will continue to evaluate for appropriate indications and we may provide recommendations regarding medication, dose, and schedule, as well as monitoring recommendations, however, we will not be taking over the actual prescribing of these substances. On those patients where we are treating their chronic pain with interventional therapies, exceptions will be considered on a case by case basis. At this time, we will try to continue providing this supplemental service to those patients we have been managing in the past. However, as of August 1st, 2023, we no  longer   will be sending additional prescriptions to other pharmacies for the purpose of solving their supply problems. Once we send a prescription to a pharmacy, we will not be resending it again to another pharmacy to cover for their shortages.   What to do. Write as many letters as you can. Recruit the help of family members in writing these letters. Below are some of the places where you can write to make your voice heard. Let them know what the problem is and push them to look for solutions.   Search internet for: "[place] find your legislators" https://www.ncleg.gov/findyourlegislators  Search internet for: "[place] insurance commissioner complaints" https://www.ncdoi.gov/contactscomplaints/assistance-or-file-complaint  Search internet for: " Board of Pharmacy complaints" http://www.ncbop.org/contact.htm  Search internet for: "[facility] pharmacy complaints" Email [facility] Customer Relations https://www.[facility]vs.com/help/email-customer-relations.jsp?callType=store  Search internet for: "[facility] pharmacy customer service complaints" https://www.[facility].com/topic/marketing/contactus/contactus_customerservice.jsp  ____________________________________________________________________________________________   

## 2021-11-26 LAB — TOXASSURE SELECT 13 (MW), URINE

## 2021-12-04 ENCOUNTER — Emergency Department: Payer: BC Managed Care – PPO

## 2021-12-04 ENCOUNTER — Other Ambulatory Visit: Payer: Self-pay

## 2021-12-04 DIAGNOSIS — Z7901 Long term (current) use of anticoagulants: Secondary | ICD-10-CM | POA: Insufficient documentation

## 2021-12-04 DIAGNOSIS — M25572 Pain in left ankle and joints of left foot: Secondary | ICD-10-CM | POA: Diagnosis not present

## 2021-12-04 DIAGNOSIS — I2510 Atherosclerotic heart disease of native coronary artery without angina pectoris: Secondary | ICD-10-CM | POA: Diagnosis not present

## 2021-12-04 DIAGNOSIS — M7662 Achilles tendinitis, left leg: Secondary | ICD-10-CM | POA: Insufficient documentation

## 2021-12-04 DIAGNOSIS — M19072 Primary osteoarthritis, left ankle and foot: Secondary | ICD-10-CM | POA: Diagnosis not present

## 2021-12-04 NOTE — ED Triage Notes (Signed)
Pt states that he woke up Wednesday with severe left achilles pain. Worse with trying to "put his heel down" Denies any injury to the area.

## 2021-12-05 ENCOUNTER — Emergency Department
Admission: EM | Admit: 2021-12-05 | Discharge: 2021-12-05 | Disposition: A | Discharge status: Home / Self Care | Payer: BC Managed Care – PPO | Attending: Emergency Medicine | Admitting: Emergency Medicine

## 2021-12-05 DIAGNOSIS — M7662 Achilles tendinitis, left leg: Secondary | ICD-10-CM | POA: Diagnosis not present

## 2021-12-05 MED ORDER — IBUPROFEN 800 MG PO TABS: 800.0000 mg | ORAL_TABLET | Freq: Three times a day (TID) | ORAL | 0 refills | Status: AC | PRN

## 2021-12-05 MED ORDER — OXYCODONE-ACETAMINOPHEN 5-325 MG PO TABS
2.0000 | ORAL_TABLET | Freq: Once | ORAL | Status: AC
  Administered 2021-12-05: 2 via ORAL
  Filled 2021-12-05: qty 2

## 2021-12-05 MED ORDER — IBUPROFEN 800 MG PO TABS
800.0000 mg | ORAL_TABLET | Freq: Once | ORAL | Status: AC
  Administered 2021-12-05: 800 mg via ORAL
  Filled 2021-12-05: qty 1

## 2021-12-05 NOTE — ED Provider Notes (Signed)
[facility] Provider Note    Event Date/Time   First MD Initiated Contact with Patient 12/05/21 0234     (approximate)   History   Ankle Pain   HPI  Justin [name] is a 56 y.o. male with history of previous right upper extremity DVT who presents to the emergency department with complaints of left Achilles pain.  States that he had to do some work where he was squatting frequently and standing on his toes.  He states that the next morning he woke up and he had severe left Achilles pain and was unable to bear weight.  He denies any other injury.  He is able to dorsiflex and plantarflex the foot but it does cause pain.  He has been taking oxycodone at home without much relief.  Has not tried any anti-inflammatories.     History provided by patient and wife.    Past Medical History:  Diagnosis Date   Arm vein blood clot    right   Coronary artery disease    DVT (deep venous thrombosis) ([place]) 2012; January 2016   History of right arm; now right lower shotty   GERD (gastroesophageal reflux disease)    Rib pain on right side 08/04/2016   RT rib pain that radiates around to the back. Level 8 of 10.  Constant. Started 1 mo ago, 1 week after DVT.   Vascular thoracic outlet syndrome    Right     Past Surgical History:  Procedure Laterality Date   CAROTID ANGIOGRAM     COLONOSCOPY WITH PROPOFOL N/A 10/10/2019   Procedure: COLONOSCOPY WITH PROPOFOL;  Surgeon: Jonathon Bellows, MD;  Location: [facility] END[facility]COPY;  Service: Gastroenterology;  Laterality: N/A;   KNEE CARTILAGE SURGERY     Performed for cracked patella and torn meniscus   Right upper extremity thrombectomy  2012   SCALENOTOMY W/O RESECTION CERVICAL RIB  2012   4 right-sided thoracic outlet syndrome    MEDICATIONS:  Prior to Admission medications   Medication Sig Start Date End Date Taking? Authorizing Provider  ibuprofen (ADVIL) 800 MG tablet Take 1 tablet (800 mg total) by mouth every 8 (eight)  hours as needed for mild pain. 12/05/21  Yes Baruc Tugwell, Delice Bison, DO  acetaminophen (TYLENOL) 500 MG tablet Take 1,000 mg by mouth every 6 (six) hours as needed.    [provider]  albuterol (VENTOLIN HFA) 108 (90 Base) MCG/ACT inhaler TAKE 2 PUFFS BY MOUTH EVERY 6 HOURS AS NEEDED FOR WHEEZE OR SHORTNESS OF BREATH 07/30/21   Jon Billings, NP  apixaban (ELIQUIS) 5 MG TABS tablet Take 1 tablet (5 mg total) by mouth 2 (two) times daily. 08/21/21   Hughie Closs, PA-C  atorvastatin (LIPITOR) 80 MG tablet TAKE 1 TABLET BY MOUTH EVERY DAY 07/20/21   Jon Billings, NP  Calcium Carbonate-Vit D-Min (GNP CALCIUM 1200) 1200-1000 MG-UNIT CHEW Chew 1,200 mg by mouth daily with breakfast. Take in combination with vitamin D and magnesium. 03/27/20 11/23/21  Milinda Pointer, MD  oxyCODONE (OXY IR/ROXICODONE) 5 MG immediate release tablet Take 1 tablet (5 mg total) by mouth every 8 (eight) hours as needed for severe pain. Must last 30 days 12/02/21 01/01/22  Milinda Pointer, MD  oxyCODONE (OXY IR/ROXICODONE) 5 MG immediate release tablet Take 1 tablet (5 mg total) by mouth every 8 (eight) hours as needed for severe pain. Must last 30 days 01/01/22 01/31/22  Milinda Pointer, MD  oxyCODONE (OXY IR/ROXICODONE) 5 MG immediate release tablet Take  1 tablet (5 mg total) by mouth every 8 (eight) hours as needed for severe pain. Must last 30 days 01/31/22 03/02/22  Milinda Pointer, MD    Physical Exam   Triage Vital Signs: ED Triage Vitals  Enc Vitals Group     BP 12/04/21 2051 128/69     Pulse Rate 12/04/21 2051 68     Resp 12/04/21 2051 17     Temp 12/04/21 2051 98.3 F (36.8 C)     Temp Source 12/04/21 2051 Oral     SpO2 12/04/21 2051 98 %     Weight --      Height --      Head Circumference --      Peak Flow --      Pain Score 12/04/21 2048 8     Pain Loc --      Pain Edu? --      Excl. in [place]? --     Most recent vital signs: Vitals:   12/04/21 2051 12/05/21 0300  BP: 128/69 (!)  125/53  Pulse: 68 (!) 53  Resp: 17 17  Temp: 98.3 F (36.8 C) 98.1 F (36.7 C)  SpO2: 98% 100%     CONSTITUTIONAL: Alert and responds appropriately to questions. Well-appearing; well-nourished HEAD: Normocephalic, atraumatic EYES: Conjunctivae clear, pupils appear equal ENT: normal nose; moist mucous membranes NECK: Normal range of motion CARD: Regular rate and rhythm RESP: Normal chest excursion without splinting or tachypnea; no hypoxia or respiratory distress, speaking full sentences ABD/GI: non-distended EXT: Normal ROM in all joints, no major deformities noted.  Patient is tender to palpation over the left Achilles tendon and heel without deformity, redness or warmth.  He is able to dorsiflex and plantarflex his foot and has a normal Thompson's test.  He has a 2+ right and left DP pulse.  Normal sensation diffusely.  No calf tenderness or calf swelling.  Compartments soft.  No bony abnormality or bony tenderness. SKIN: Normal color for age and race, no rashes on exposed skin NEURO: Moves all extremities equally, normal speech, no facial asymmetry noted PSYCH: The patient's mood and manner are appropriate. Grooming and personal hygiene are appropriate.  ED Results / Procedures / Treatments   LABS: (all labs ordered are listed, but only abnormal results are displayed) Labs Reviewed - No data to display   EKG:   RADIOLOGY: My personal review and interpretation of imaging: X-ray of the left ankle shows no acute abnormality.  I have personally reviewed all radiology reports. DG Ankle Complete Left  Result Date: 12/04/2021 CLINICAL DATA:  Ankle and Achilles tendon pain, initial encounter EXAM: LEFT ANKLE COMPLETE - 3+ VIEW COMPARISON:  None Available. FINDINGS: Tarsal degenerative changes are seen. No acute fracture or dislocation is noted. No soft tissue abnormality is seen. IMPRESSION: No acute abnormality noted. Electronically Signed   By: Inez Catalina M.D.   On: 12/04/2021  21:13     PROCEDURES:  Critical Care performed: No      Procedures    IMPRESSION / MDM / ASSESSMENT AND PLAN / ED COURSE  I reviewed the triage vital signs and the nursing notes.   Patient here with left heel and Achilles pain.     DIFFERENTIAL DIAGNOSIS (includes but not limited to):   Suspect Achilles tendinitis.  Doubt complete rupture.  Did discuss with patient that this could be a partial rupture.  Low suspicion for fracture.  Doubt DVT, arterial obstruction, gout, septic arthritis.  No signs of cellulitis.  No  sign of compartment syndrome.  Patient's presentation is most consistent with acute complicated illness / injury requiring diagnostic workup.  PLAN: We will obtain x-ray of the left ankle.  He states he has plenty of oxycodone at home.  Will trial short course of anti-inflammatories.  Recommended rest, elevation and ice.  We will give outpatient orthopedic follow-up.  Will place in an Ace wrap.  He has crutches to help him get around for the next few days and to allow him to let this foot and Achilles tendon rest.   MEDICATIONS GIVEN IN ED: Medications  oxyCODONE-acetaminophen (PERCOCET/ROXICET) 5-325 MG per tablet 2 tablet (2 tablets Oral Given 12/05/21 0306)  ibuprofen (ADVIL) tablet 800 mg (800 mg Oral Given 12/05/21 0305)     ED COURSE: X-ray reviewed and interpreted by myself radiologist and shows no fracture or dislocation.  Patient comfortable with plan for discharge home with further outpatient management.   At this time, I do not feel there is any life-threatening condition present. I reviewed all nursing notes, vitals, pertinent previous records.  All lab and urine results, EKGs, imaging ordered have been independently reviewed and interpreted by myself.  I reviewed all available radiology reports from any imaging ordered this visit.  Based on my assessment, I feel the patient is safe to be discharged home without further emergent workup and can continue  workup as an outpatient as needed. Discussed all findings, treatment plan as well as usual and customary return precautions.  They verbalize understanding and are comfortable with this plan.  Outpatient follow-up has been provided as needed.  All questions have been answered.    CONSULTS: No emergent orthopedic consult needed now.  Neurovascular intact distally with negative x-ray.  Doubt complete Achilles rupture as he is able to dorsiflex his foot and has a normal Thompson's test.   OUTSIDE RECORDS REVIEWED: Reviewed patient's last office visit with Gillie Manners with orthopedics on 11/09/2019 at [facility]     FINAL CLINICAL IMPRESSION(S) / ED DIAGNOSES   Final diagnoses:  Tendonitis, Achilles, left     Rx / DC Orders   ED Discharge Orders          Ordered    ibuprofen (ADVIL) 800 MG tablet  Every 8 hours PRN        12/05/21 0254             Note:  This document was prepared using Dragon voice recognition software and may include unintentional dictation errors.   Aleria Maheu, Delice Bison, DO 12/05/21 [phone]

## 2021-12-14 ENCOUNTER — Inpatient Hospital Stay (HOSPITAL_BASED_OUTPATIENT_CLINIC_OR_DEPARTMENT_OTHER): Payer: BC Managed Care – PPO | Admitting: Oncology

## 2021-12-14 ENCOUNTER — Other Ambulatory Visit: Payer: Self-pay

## 2021-12-14 ENCOUNTER — Inpatient Hospital Stay: Payer: BC Managed Care – PPO | Attending: Oncology

## 2021-12-14 ENCOUNTER — Encounter: Payer: Self-pay | Admitting: Oncology

## 2021-12-14 VITALS — BP 113/60 | HR 65 | Temp 96.3°F | Resp 16 | Ht 72.0 in | Wt 257.0 lb

## 2021-12-14 DIAGNOSIS — Z86718 Personal history of other venous thrombosis and embolism: Secondary | ICD-10-CM | POA: Insufficient documentation

## 2021-12-14 DIAGNOSIS — Z7901 Long term (current) use of anticoagulants: Secondary | ICD-10-CM | POA: Insufficient documentation

## 2021-12-14 DIAGNOSIS — I82409 Acute embolism and thrombosis of unspecified deep veins of unspecified lower extremity: Secondary | ICD-10-CM

## 2021-12-14 DIAGNOSIS — M25572 Pain in left ankle and joints of left foot: Secondary | ICD-10-CM | POA: Diagnosis not present

## 2021-12-14 DIAGNOSIS — Z79899 Other long term (current) drug therapy: Secondary | ICD-10-CM | POA: Insufficient documentation

## 2021-12-14 DIAGNOSIS — Z86711 Personal history of pulmonary embolism: Secondary | ICD-10-CM | POA: Diagnosis not present

## 2021-12-14 LAB — CBC WITH DIFFERENTIAL/PLATELET
Abs Immature Granulocytes: 0.04 10*3/uL (ref 0.00–0.07)
Basophils Absolute: 0 10*3/uL (ref 0.0–0.1)
Basophils Relative: 0 %
Eosinophils Absolute: 0.3 10*3/uL (ref 0.0–0.5)
Eosinophils Relative: 3 %
HCT: 45.3 % (ref 39.0–52.0)
Hemoglobin: 14.9 g/dL (ref 13.0–17.0)
Immature Granulocytes: 0 %
Lymphocytes Relative: 27 %
Lymphs Abs: 2.6 10*3/uL (ref 0.7–4.0)
MCH: 30.8 pg (ref 26.0–34.0)
MCHC: 32.9 g/dL (ref 30.0–36.0)
MCV: 93.6 fL (ref 80.0–100.0)
Monocytes Absolute: 0.7 10*3/uL (ref 0.1–1.0)
Monocytes Relative: 8 %
Neutro Abs: 5.8 10*3/uL (ref 1.7–7.7)
Neutrophils Relative %: 62 %
Platelets: 238 10*3/uL (ref 150–400)
RBC: 4.84 MIL/uL (ref 4.22–5.81)
RDW: 12.3 % (ref 11.5–15.5)
Smear Review: ADEQUATE
WBC: 9.4 10*3/uL (ref 4.0–10.5)
nRBC: 0 % (ref 0.0–0.2)

## 2021-12-14 LAB — COMPREHENSIVE METABOLIC PANEL
ALT: 22 U/L (ref 0–44)
AST: 22 U/L (ref 15–41)
Albumin: 3.7 g/dL (ref 3.5–5.0)
Alkaline Phosphatase: 93 U/L (ref 38–126)
Anion gap: 4 — ABNORMAL LOW (ref 5–15)
BUN: 13 mg/dL (ref 6–20)
CO2: 26 mmol/L (ref 22–32)
Calcium: 8.6 mg/dL — ABNORMAL LOW (ref 8.9–10.3)
Chloride: 104 mmol/L (ref 98–111)
Creatinine, Ser: 1.34 mg/dL — ABNORMAL HIGH (ref 0.61–1.24)
GFR, Estimated: >60 mL/min (ref >60)
Glucose, Bld: 119 mg/dL — ABNORMAL HIGH (ref 70–99)
Potassium: 3.7 mmol/L (ref 3.5–5.1)
Sodium: 134 mmol/L — ABNORMAL LOW (ref 135–145)
Total Bilirubin: 0.5 mg/dL (ref 0.3–1.2)
Total Protein: 6.9 g/dL (ref 6.5–8.1)

## 2021-12-20 NOTE — Progress Notes (Signed)
Hematology/Oncology Consult note [facility]  [phone] [phone]  Patient Care Team: Jon Billings, NP as PCP - General Schnier, Dolores Lory, MD (Vascular Surgery) Sindy Guadeloupe, MD as Consulting Physician (Oncology)   Name of the patient: Justin [name]  [id number]  Jan 25, 1966   Date of visit: 12/20/21  Diagnosis- history of recurrent DVT and PE on eliquis  Chief complaint/ Reason for visit- routine f/u of dvt   Heme/Onc history: Justin [name] is a 56 y.o. male with recurrent thrombosis x 4.  He developed a right upper extremity clot in 2010. He underwent thrombolysis.  He was on Coumadin x 6 months.  He developed a recurrent clot in the right upper extremity in 2011.  He underwent thoracic outlet decompression.  He was on Coumadin for 6-9 months.  He developed a right lower extremity DVT in 2016.  He was started on Xarelto and subsequently switched to eliquis   He developed a left upper extremity DVT on 06/07/2016 while on Xarelto.  Duplex revealed a near occlusive DVT in the lateral aspect of the left subclavian vein and nonocclusive thrombus extending into the adjacent axillary vein.  He was started on Lovenox.   Hypercoagulable work-up was otherwise negative.  Lupus anticoagulant positive likely because he was on anticoagulation and therefore cannot be interpreted accurately.  Interval history- tolerating eliquis well without significant side effects  ECOG PS- 1 Pain scale- 0   Review of systems- Review of Systems  Constitutional:  Negative for chills, fever, malaise/fatigue and weight loss.  HENT:  Negative for congestion, ear discharge and nosebleeds.   Eyes:  Negative for blurred vision.  Respiratory:  Negative for cough, hemoptysis, sputum production, shortness of breath and wheezing.   Cardiovascular:  Negative for chest pain, palpitations, orthopnea and claudication.  Gastrointestinal:  Negative for abdominal  pain, blood in stool, constipation, diarrhea, heartburn, melena, nausea and vomiting.  Genitourinary:  Negative for dysuria, flank pain, frequency, hematuria and urgency.  Musculoskeletal:  Negative for back pain, joint pain and myalgias.  Skin:  Negative for rash.  Neurological:  Negative for dizziness, tingling, focal weakness, seizures, weakness and headaches.  Endo/Heme/Allergies:  Does not bruise/bleed easily.  Psychiatric/Behavioral:  Negative for depression and suicidal ideas. The patient does not have insomnia.       No Known Allergies   Past Medical History:  Diagnosis Date   Arm vein blood clot    right   Coronary artery disease    DVT (deep venous thrombosis) ([place]) 2012; January 2016   History of right arm; now right lower shotty   GERD (gastroesophageal reflux disease)    Rib pain on right side 08/04/2016   RT rib pain that radiates around to the back. Level 8 of 10.  Constant. Started 1 mo ago, 1 week after DVT.   Vascular thoracic outlet syndrome    Right      Past Surgical History:  Procedure Laterality Date   CAROTID ANGIOGRAM     COLONOSCOPY WITH PROPOFOL N/A 10/10/2019   Procedure: COLONOSCOPY WITH PROPOFOL;  Surgeon: Jonathon Bellows, MD;  Location: [facility] ENDOSCOPY;  Service: Gastroenterology;  Laterality: N/A;   KNEE CARTILAGE SURGERY     Performed for cracked patella and torn meniscus   Right upper extremity thrombectomy  2012   SCALENOTOMY W/O RESECTION CERVICAL RIB  2012   4 right-sided thoracic outlet syndrome    Social History   Socioeconomic History   Marital status: Married  Spouse name: Not on file   Number of children: Not on file   Years of education: Not on file   Highest education level: Not on file  Occupational History    Comment: [employer]  Tobacco Use   Smoking status: Former    Packs/day: 0.50    Types: Cigarettes   Smokeless tobacco: Never   Tobacco comments:    quit smoking in 2010  Vaping Use   Vaping Use: Never used  Substance  and Sexual Activity   Alcohol use: No   Drug use: No   Sexual activity: Yes    Birth control/protection: None  Other Topics Concern   Not on file  Social History Narrative   He works full-time at [employer].    He is married. Nonsmoker who is not currently. Alcohol.   Social Determinants of Health   Financial Resource Strain: Low Risk  (02/21/2021)   Overall Financial Resource Strain (CARDIA)    Difficulty of Paying Living Expenses: Not very hard  Food Insecurity: No Food Insecurity (02/21/2021)   Hunger Vital Sign    Worried About Running Out of Food in the Last Year: Never true    Ran Out of Food in the Last Year: Never true  Transportation Needs: No Transportation Needs (02/21/2021)   PRAPARE - Hydrologist (Medical): No    Lack of Transportation (Non-Medical): No  Physical Activity: Inactive (02/21/2021)   Exercise Vital Sign    Days of Exercise per Week: 0 days    Minutes of Exercise per Session: 0 min  Stress: No Stress Concern Present (02/21/2021)   [place]    Feeling of Stress : Not at all  Social Connections: Moderately Isolated (02/21/2021)   Social Connection and Isolation Panel [NHANES]    Frequency of Communication with Friends and Family: Once a week    Frequency of Social Gatherings with Friends and Family: Never    Attends Religious Services: More than 4 times per year    Active Member of [employer] or Organizations: No    Attends Archivist Meetings: Never    Marital Status: Married  Human resources officer Violence: Not At Risk (02/21/2021)   Humiliation, Afraid, Rape, and Kick questionnaire    Fear of Current or Ex-Partner: No    Emotionally Abused: No    Physically Abused: No    Sexually Abused: No    Family History  Problem Relation Age of Onset   Heart disease Father    Breast cancer Father 39   Diabetes Father    Heart attack Father    Cancer Maternal  Grandmother    Leukemia Maternal Grandfather    Prostate cancer Maternal Uncle      Current Outpatient Medications:    albuterol (VENTOLIN HFA) 108 (90 Base) MCG/ACT inhaler, TAKE 2 PUFFS BY MOUTH EVERY 6 HOURS AS NEEDED FOR WHEEZE OR SHORTNESS OF BREATH, Disp: 8.5 each, Rfl: 1   apixaban (ELIQUIS) 5 MG TABS tablet, Take 1 tablet (5 mg total) by mouth 2 (two) times daily., Disp: 180 tablet, Rfl: 0   atorvastatin (LIPITOR) 80 MG tablet, TAKE 1 TABLET BY MOUTH EVERY DAY, Disp: 90 tablet, Rfl: 1   Calcium Carbonate-Vit D-Min (GNP CALCIUM 1200) 1200-1000 MG-UNIT CHEW, Chew 1,200 mg by mouth daily with breakfast. Take in combination with vitamin D and magnesium., Disp: 30 tablet, Rfl: 2   ibuprofen (ADVIL) 800 MG tablet, Take 1 tablet (800 mg total) by  mouth every 8 (eight) hours as needed for mild pain., Disp: 30 tablet, Rfl: 0   oxyCODONE (OXY IR/ROXICODONE) 5 MG immediate release tablet, Take 1 tablet (5 mg total) by mouth every 8 (eight) hours as needed for severe pain. Must last 30 days, Disp: 90 tablet, Rfl: 0   [START ON 01/01/2022] oxyCODONE (OXY IR/ROXICODONE) 5 MG immediate release tablet, Take 1 tablet (5 mg total) by mouth every 8 (eight) hours as needed for severe pain. Must last 30 days, Disp: 90 tablet, Rfl: 0   [START ON 01/31/2022] oxyCODONE (OXY IR/ROXICODONE) 5 MG immediate release tablet, Take 1 tablet (5 mg total) by mouth every 8 (eight) hours as needed for severe pain. Must last 30 days, Disp: 90 tablet, Rfl: 0   acetaminophen (TYLENOL) 500 MG tablet, Take 1,000 mg by mouth every 6 (six) hours as needed. (Patient not taking: Reported on 12/14/2021), Disp: , Rfl:   Physical exam:  Vitals:   12/14/21 1427 12/14/21 1432  BP: 105/71 113/60  Pulse: 65   Resp: 16   Temp: (!) 96.3 F (35.7 C)   TempSrc: Tympanic   SpO2: 100%   Weight: 257 lb (116.6 kg)   Height: 6' (1.829 m)    Physical Exam Constitutional:      General: He is not in acute distress. Cardiovascular:     Rate  and Rhythm: Normal rate and regular rhythm.     Heart sounds: Normal heart sounds.  Pulmonary:     Effort: Pulmonary effort is normal.     Breath sounds: Normal breath sounds.  Skin:    General: Skin is warm and dry.  Neurological:     Mental Status: He is alert and oriented to person, place, and time.         Latest Ref Rng & Units 12/14/2021    2:15 PM  CMP  Glucose 70 - 99 mg/dL 119   BUN 6 - 20 mg/dL 13   Creatinine 0.61 - 1.24 mg/dL 1.34   Sodium 135 - 145 mmol/L 134   Potassium 3.5 - 5.1 mmol/L 3.7   Chloride 98 - 111 mmol/L 104   CO2 22 - 32 mmol/L 26   Calcium 8.9 - 10.3 mg/dL 8.6   Total Protein 6.5 - 8.1 g/dL 6.9   Total Bilirubin 0.3 - 1.2 mg/dL 0.5   Alkaline Phos 38 - 126 U/L 93   AST 15 - 41 U/L 22   ALT 0 - 44 U/L 22       Latest Ref Rng & Units 12/14/2021    2:15 PM  CBC  WBC 4.0 - 10.5 K/uL 9.4   Hemoglobin 13.0 - 17.0 g/dL 14.9   Hematocrit 39.0 - 52.0 % 45.3   Platelets 150 - 400 K/uL 238     No images are attached to the encounter.  DG Ankle Complete Left  Result Date: 12/04/2021 CLINICAL DATA:  Ankle and Achilles tendon pain, initial encounter EXAM: LEFT ANKLE COMPLETE - 3+ VIEW COMPARISON:  None Available. FINDINGS: Tarsal degenerative changes are seen. No acute fracture or dislocation is noted. No soft tissue abnormality is seen. IMPRESSION: No acute abnormality noted. Electronically Signed   By: Inez Catalina M.D.   On: 12/04/2021 21:13     Assessment and plan- Patient is a 55 y.o. male with history of recurrent DVT on Eliquis here for routine follow-up  Patient is tolerating Eliquis well without any significant side effects.He has not missed any doses and has not had any recurrent  thrombosis on Eliquis.  I will see him back in 1 year with labs.  If he however wishes to follow-up with his primary care provider who can then take over the Eliquis prescription that would be fine with him as well.   Visit Diagnosis 1. Encounter for current  long-term use of anticoagulants   2. Recurrent deep vein thrombosis (DVT) of lower extremity, unspecified laterality ([place])      Dr. Randa Evens, MD, MPH [facility] [phone] 12/20/2021 3:53 PM

## 2021-12-30 DIAGNOSIS — M25572 Pain in left ankle and joints of left foot: Secondary | ICD-10-CM | POA: Diagnosis not present

## 2022-01-04 DIAGNOSIS — M25572 Pain in left ankle and joints of left foot: Secondary | ICD-10-CM | POA: Diagnosis not present

## 2022-01-04 DIAGNOSIS — M7662 Achilles tendinitis, left leg: Secondary | ICD-10-CM | POA: Diagnosis not present

## 2022-01-13 ENCOUNTER — Other Ambulatory Visit: Payer: Self-pay | Admitting: Nurse Practitioner

## 2022-01-13 DIAGNOSIS — E782 Mixed hyperlipidemia: Secondary | ICD-10-CM

## 2022-01-13 NOTE — Telephone Encounter (Signed)
Requested Prescriptions  Pending Prescriptions Disp Refills  . atorvastatin (LIPITOR) 80 MG tablet [Pharmacy Med Name: ATORVASTATIN 80 MG TABLET] 90 tablet 1    Sig: TAKE 1 TABLET BY MOUTH EVERY DAY     Cardiovascular:  Antilipid - Statins Failed - 01/13/2022  2:32 AM      Failed - Lipid Panel in normal range within the last 12 months    Cholesterol, Total  Date Value Ref Range Status  07/30/2021 206 (H) 100 - 199 mg/dL Final   LDL Chol Calc (NIH)  Date Value Ref Range Status  07/30/2021 117 (H) 0 - 99 mg/dL Final   HDL  Date Value Ref Range Status  07/30/2021 55 >39 mg/dL Final   Triglycerides  Date Value Ref Range Status  07/30/2021 196 (H) 0 - 149 mg/dL Final         Passed - Patient is not pregnant      Passed - Valid encounter within last 12 months    Recent Outpatient Visits          5 months ago IFG (impaired fasting glucose)   [facility] Jon Billings, NP   11 months ago Mixed hyperlipidemia   [facility] Jon Billings, NP   1 year ago [place], Karen, NP   1 year ago Mixed hyperlipidemia   [facility] Jon Billings, NP   1 year ago IFG (impaired fasting glucose)   [facility], Lilia Argue, [place]      Future Appointments            In 2 weeks Jon Billings, NP [facility], [place]

## 2022-01-18 ENCOUNTER — Encounter: Payer: Medicare Other | Admitting: Pain Medicine

## 2022-01-19 DIAGNOSIS — M25572 Pain in left ankle and joints of left foot: Secondary | ICD-10-CM | POA: Diagnosis not present

## 2022-01-26 ENCOUNTER — Telehealth: Payer: Self-pay | Admitting: Nurse Practitioner

## 2022-01-26 DIAGNOSIS — M25572 Pain in left ankle and joints of left foot: Secondary | ICD-10-CM | POA: Diagnosis not present

## 2022-01-26 NOTE — Telephone Encounter (Signed)
Copied from [place] [id number]. Topic: Appointment Scheduling - Scheduling Inquiry for Clinic >> Jan 25, 2022  4:08 PM [place] B wrote: Reason for CRM: Patient called in to reschedule his physical from 08/23 but now wants him to schedule an medicare sequential visit first. Please call back

## 2022-01-27 ENCOUNTER — Encounter: Payer: Medicare Other | Admitting: Nurse Practitioner

## 2022-01-29 ENCOUNTER — Telehealth: Payer: Self-pay | Admitting: Nurse Practitioner

## 2022-01-29 NOTE — Telephone Encounter (Signed)
Copied from [place] [id number]. Topic: Medicare AWV >> Jan 29, 2022 11:28 AM Justin [name] wrote: Reason for CRM: Returning patients call to schedule AWV. Advised patient to call me back at my direct line [phone] and we would get that AWV scheduled.

## 2022-02-01 DIAGNOSIS — M25572 Pain in left ankle and joints of left foot: Secondary | ICD-10-CM | POA: Diagnosis not present

## 2022-02-03 DIAGNOSIS — M7662 Achilles tendinitis, left leg: Secondary | ICD-10-CM | POA: Diagnosis not present

## 2022-02-03 DIAGNOSIS — M25572 Pain in left ankle and joints of left foot: Secondary | ICD-10-CM | POA: Diagnosis not present

## 2022-02-16 DIAGNOSIS — M25572 Pain in left ankle and joints of left foot: Secondary | ICD-10-CM | POA: Diagnosis not present

## 2022-02-18 NOTE — Progress Notes (Unsigned)
PROVIDER NOTE: Information contained herein reflects review and annotations entered in association with encounter. Interpretation of such information and data should be left to medically-trained personnel. Information provided to patient can be located elsewhere in the medical record under "Patient Instructions". Document created using STT-dictation technology, any transcriptional errors that may result from process are unintentional.    Patient: Justin [name]  Service Category: E/M  Provider: Gaspar Cola, MD  DOB: 03/04/1966  DOS: 02/22/2022  Referring Provider: Jon Billings, NP  MRN: [id number]  Specialty: Interventional Pain Management  PCP: Justin Billings, NP  Type: Established Patient  Setting: Ambulatory outpatient    Location: Office  Delivery: Face-to-face     HPI  [name], a 56 y.o. year old male, is here today because of his Chronic pain syndrome [G89.4]. [name] primary complain today is No chief complaint on file. Last encounter: My last encounter with him was on 11/23/2021. Pertinent problems: 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 Pain Assessment: Severity of   is reported as a  /10. Location:    / . Onset:  . Quality:  . Timing:  . Modifying factor(s):  Marland Kitchen Vitals:  vitals were not taken for this visit.   Reason for encounter: medication management. ***  RTCB: 05/31/2022 Nonopioids transferred 03/24/2020: Turmeric, Calcium  Pharmacotherapy Assessment  Analgesic: Oxycodone IR 5 mg, 1 tab PO q 8 hrs (15 mg/day of oxycodone)  MME/day: 22.5 mg/day.    Monitoring: [place] PMP: PDMP reviewed during this encounter.       Pharmacotherapy: No side-effects or adverse reactions reported. Compliance: No problems identified. Effectiveness: Clinically acceptable.  No notes on file  No results found for: "CBDTHCR" No results found for: "D8THCCBX" No results found for: "D9THCCBX"  UDS:  Summary  Date Value Ref Range Status  11/23/2021 Note  Final    Comment:    ==================================================================== [facility] [facility] (MW) ==================================================================== Test                             Result       Flag       Units  Drug Present and Declared for Prescription Verification   Oxycodone                      1545         EXPECTED   ng/mg creat   Oxymorphone                    858          EXPECTED   ng/mg creat   Noroxycodone                   1398         EXPECTED   ng/mg creat   Noroxymorphone                 231          EXPECTED   ng/mg creat    Sources of oxycodone are scheduled prescription medications.    Oxymorphone,  noroxycodone, and noroxymorphone are expected    metabolites of oxycodone. Oxymorphone is also available as a    scheduled prescription medication.  ==================================================================== Test                      Result    Flag   Units      Ref Range   Creatinine              121              mg/dL      >=20 ==================================================================== Declared Medications:  The flagging and interpretation on this report are based on the  following declared medications.  Unexpected results may arise from  inaccuracies in the declared medications.   **Note: The testing scope of this panel includes these medications:   Oxycodone   **Note: The testing scope of this panel does not include the  following reported medications:   Acetaminophen (Tylenol)  Albuterol (Ventolin HFA)  Apixaban  (Eliquis)  Atorvastatin (Lipitor)  Calcium ==================================================================== For clinical consultation, please call [phone]. ====================================================================       ROS  Constitutional: Denies any fever or chills Gastrointestinal: No reported hemesis, hematochezia, vomiting, or acute GI distress Musculoskeletal: Denies any acute onset joint swelling, redness, loss of ROM, or weakness Neurological: No reported episodes of acute onset apraxia, aphasia, dysarthria, agnosia, amnesia, paralysis, loss of coordination, or loss of consciousness  Medication Review  GNP Calcium 1200, acetaminophen, albuterol, apixaban, atorvastatin, ibuprofen, and oxyCODONE  History Review  Allergy: [name] has No Known Allergies. Drug: [name]  reports no history of drug use. Alcohol:  reports no history of alcohol use. Tobacco:  reports that he has quit smoking. His smoking use included cigarettes. He smoked an average of .5 packs per day. He has never used smokeless tobacco. Social: [name]  reports that he has quit smoking. His smoking use included cigarettes. He smoked an average of .5 packs per day. He has never used smokeless tobacco. He reports that he does not drink alcohol and does not use drugs. Medical:  has a past medical history of Arm vein blood clot, Coronary artery disease, DVT (deep venous thrombosis) ([place]) (2012; January 2016), GERD (gastroesophageal reflux disease), Rib pain on right side (08/04/2016), and Vascular thoracic outlet syndrome. Surgical: [name]  has a past surgical history that includes Knee cartilage surgery; Carotid angiogram; Scalenotomy w/o resection cervical rib (2012); Right upper extremity thrombectomy (2012); and Colonoscopy with propofol (N/A, 10/10/2019). Family: family history includes Breast cancer (age of onset: 66) in his father; Cancer in his maternal grandmother; Diabetes in  his father; Heart attack in his father; Heart disease in his father; Leukemia in his maternal grandfather; Prostate cancer in his maternal uncle.  Laboratory Chemistry Profile   Renal Lab Results  Component Value Date   BUN 13 12/14/2021   CREATININE 1.34 (H) 12/14/2021   BCR 8 (L) 07/30/2021   GFRAA >60 02/19/2020   GFRNONAA >60 12/14/2021    Hepatic Lab Results  Component Value Date   AST 22 12/14/2021   ALT 22 12/14/2021   ALBUMIN 3.7 12/14/2021   ALKPHOS 93 12/14/2021   LIPASE 90 03/02/2014    Electrolytes Lab Results  Component Value Date   NA 134 (L) 12/14/2021   K 3.7 12/14/2021   CL 104 12/14/2021   CALCIUM 8.6 (L) 12/14/2021   MG 2.0 07/28/2017    Bone Lab Results  Component Value Date   VD25OH 43.0  07/30/2021   25OHVITD1 15 (L) 06/29/2018   25OHVITD2 8.4 06/29/2018   25OHVITD3 6.8 06/29/2018    Inflammation (CRP: Acute Phase) (ESR: Chronic Phase) Lab Results  Component Value Date   CRP 12 (H) 06/29/2018   ESRSEDRATE 27 06/29/2018   LATICACIDVEN 0.9 07/20/2016         Note: Above Lab results reviewed.  Recent Imaging Review  DG Ankle Complete Left CLINICAL DATA:  Ankle and Achilles tendon pain, initial encounter  EXAM: LEFT ANKLE COMPLETE - 3+ VIEW  COMPARISON:  None Available.  FINDINGS: Tarsal degenerative changes are seen. No acute fracture or dislocation is noted. No soft tissue abnormality is seen.  IMPRESSION: No acute abnormality noted.  Electronically Signed   By: Inez Catalina M.D.   On: 12/04/2021 21:13 Note: Reviewed        Physical Exam  General appearance: Well nourished, well developed, and well hydrated. In no apparent acute distress Mental status: Alert, oriented x 3 (person, place, & time)       Respiratory: No evidence of acute respiratory distress Eyes: PERLA Vitals: There were no vitals taken for this visit. BMI: Estimated body mass index is 34.86 kg/m as calculated from the following:   Height as of 12/14/21: 6'  (1.829 m).   Weight as of 12/14/21: 257 lb (116.6 kg). Ideal: Patient weight not recorded  Assessment   Diagnosis Status  1. Chronic pain syndrome   2. Chronic thoracic back pain (1ry area of Pain) (Right)   3. Thoracic radiculitis (2ry area of Pain)   4. Right upper quadrant abdominal pain (3ry area of Pain)   5. Rib pain on right side (4th area of Pain)   6. 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 Intercostal neuralgia (T6, T7) (Right)   8. Chronic low back pain (Left) with sciatica (Left)   9. Pharmacologic therapy   10. Chronic use of opiate for therapeutic purpose   11. Encounter for medication management   12. Encounter for chronic pain management    Controlled Controlled Controlled   Updated Problems: No problems updated.  Plan of Care  Problem-specific:  No problem-specific Assessment & Plan notes found for this encounter.  [name] has a current medication list which includes the following long-term medication(s): albuterol, apixaban, atorvastatin, gnp calcium 1200, and oxycodone.  Pharmacotherapy (Medications Ordered): No orders of the defined types were placed in this encounter.  Orders:  No orders of the defined types were placed in this encounter.  Follow-up plan:   No follow-ups on file.     Interventional Therapies  Risk  Complexity Considerations:   Estimated body mass index is 34.18 kg/m as calculated from the following:   Height as of this encounter: 6' (1.829 m).   Weight as of this encounter: 252 lb (114.3 kg). WNL   Planned  Pending:      Under consideration:   Diagnostic right thoracic SNRB  Diagnostic right thoracic facet block  Possible right thoracic facet medial branch RFA    Completed:   Palliative right T6, T7, T8, & T9 intercostal NB x2 (10/11/2017) (100/100/50/50)  Palliative right T6, T7, T8, & T9 intercostal nerve RFA x1 (12/29/17)    Therapeutic  Palliative (PRN) options:    Palliative right T6, T7, T8, & T9 intercostal NB #3  Palliative right T6, T7, T8, & T9 intercostal nerve RFA #2      Recent Visits Date Type Provider Dept  11/23/21 Office Visit Milinda Pointer,  MD [facility]  Showing recent visits within past 90 days and meeting all other requirements Future Appointments Date Type Provider Dept  02/22/22 Appointment Milinda Pointer, MD [facility]  Showing future appointments within next 90 days and meeting all other requirements  I discussed the assessment and treatment plan with the patient. The patient was provided an opportunity to ask questions and all were answered. The patient agreed with the plan and demonstrated an understanding of the instructions.  Patient advised to call back or seek an in-person evaluation if the symptoms or condition worsens.  Duration of encounter: *** minutes.  Total time on encounter, as per AMA guidelines included both the face-to-face and non-face-to-face time personally spent by the physician and/or other qualified health care professional(s) on the day of the encounter (includes time in activities that require the physician or other qualified health care professional and does not include time in activities normally performed by clinical staff). Physician's time may include the following activities when performed: preparing to see the patient (eg, review of tests, pre-charting review of records) obtaining and/or reviewing separately obtained history performing a medically appropriate examination and/or evaluation counseling and educating the patient/family/caregiver ordering medications, tests, or procedures referring and communicating with other health care professionals (when not separately reported) documenting clinical information in the electronic or other health record independently interpreting results (not separately reported) and communicating results to the patient/  family/caregiver care coordination (not separately reported)  Note by: Justin Cola, MD Date: 02/22/2022; Time: 1:09 PM

## 2022-02-22 ENCOUNTER — Encounter: Payer: Self-pay | Admitting: Pain Medicine

## 2022-02-22 ENCOUNTER — Ambulatory Visit: Payer: BC Managed Care – PPO | Attending: Pain Medicine | Admitting: Pain Medicine

## 2022-02-22 VITALS — BP 134/88 | HR 58 | Temp 97.8°F | Resp 16 | Ht 72.0 in | Wt 250.0 lb

## 2022-02-22 DIAGNOSIS — G588 Other specified mononeuropathies: Secondary | ICD-10-CM | POA: Diagnosis not present

## 2022-02-22 DIAGNOSIS — Z79899 Other long term (current) drug therapy: Secondary | ICD-10-CM | POA: Insufficient documentation

## 2022-02-22 DIAGNOSIS — G8929 Other chronic pain: Secondary | ICD-10-CM | POA: Insufficient documentation

## 2022-02-22 DIAGNOSIS — M5414 Radiculopathy, thoracic region: Secondary | ICD-10-CM | POA: Diagnosis not present

## 2022-02-22 DIAGNOSIS — M546 Pain in thoracic spine: Secondary | ICD-10-CM | POA: Diagnosis not present

## 2022-02-22 DIAGNOSIS — R1011 Right upper quadrant pain: Secondary | ICD-10-CM | POA: Diagnosis not present

## 2022-02-22 DIAGNOSIS — Z79891 Long term (current) use of opiate analgesic: Secondary | ICD-10-CM | POA: Insufficient documentation

## 2022-02-22 DIAGNOSIS — M5442 Lumbago with sciatica, left side: Secondary | ICD-10-CM | POA: Diagnosis not present

## 2022-02-22 DIAGNOSIS — R0781 Pleurodynia: Secondary | ICD-10-CM | POA: Diagnosis not present

## 2022-02-22 DIAGNOSIS — G894 Chronic pain syndrome: Secondary | ICD-10-CM | POA: Insufficient documentation

## 2022-02-22 DIAGNOSIS — G8912 Acute post-thoracotomy pain: Secondary | ICD-10-CM | POA: Insufficient documentation

## 2022-02-22 MED ORDER — OXYCODONE HCL 5 MG PO TABS: 5.0000 mg | ORAL_TABLET | Freq: Three times a day (TID) | ORAL | 0 refills | Status: DC | PRN

## 2022-02-22 NOTE — Progress Notes (Signed)
Nursing Pain Medication Assessment:  Safety precautions to be maintained throughout the outpatient stay will include: orient to surroundings, keep bed in low position, maintain call bell within reach at all times, provide assistance with transfer out of bed and ambulation.  Medication Inspection Compliance: Pill count conducted under aseptic conditions, in front of the patient. Neither the pills nor the bottle was removed from the patient's sight at any time. Once count was completed pills were immediately returned to the patient in their original bottle.  Medication: Oxycodone IR Pill/Patch Count:  32 of 90 pills remain Pill/Patch Appearance: Markings consistent with prescribed medication Bottle Appearance: Standard pharmacy container. Clearly labeled. Filled Date: 08 / 29 / 2023 Last Medication intake:  Today

## 2022-02-23 ENCOUNTER — Ambulatory Visit (INDEPENDENT_AMBULATORY_CARE_PROVIDER_SITE_OTHER): Payer: BC Managed Care – PPO | Admitting: *Deleted

## 2022-02-23 DIAGNOSIS — Z0000 Encounter for general adult medical examination without abnormal findings: Secondary | ICD-10-CM

## 2022-02-23 NOTE — Patient Instructions (Signed)
Justin [name] , Thank you for taking time to come for your Medicare Wellness Visit. I appreciate your ongoing commitment to your health goals. Please review the following plan we discussed and let me know if I can assist you in the future.   Screening recommendations/referrals: Colonoscopy: up to date Recommended yearly ophthalmology/optometry visit for glaucoma screening and checkup Recommended yearly dental visit for hygiene and checkup  Vaccinations: Influenza vaccine: Education provided  Tdap vaccine: up to date Shingles vaccine: 1 of 2    Advanced directives: Education provided      Preventive Care 40-64 Years, Male Preventive care refers to lifestyle choices and visits with your health care provider that can promote health and wellness. What does preventive care include? A yearly physical exam. This is also called an annual well check. Dental exams once or twice a year. Routine eye exams. Ask your health care provider how often you should have your eyes checked. Personal lifestyle choices, including: Daily care of your teeth and gums. Regular physical activity. Eating a healthy diet. Avoiding tobacco and drug use. Limiting alcohol use. Practicing safe sex. Taking low-dose aspirin every day starting at age 67. What happens during an annual well check? The services and screenings done by your health care provider during your annual well check will depend on your age, overall health, lifestyle risk factors, and family history of disease. Counseling  Your health care provider may ask you questions about your: Alcohol use. Tobacco use. Drug use. Emotional well-being. Home and relationship well-being. Sexual activity. Eating habits. Work and work Statistician. Screening  You may have the following tests or measurements: Height, weight, and BMI. Blood pressure. Lipid and cholesterol levels. These may be checked every 5 years, or more frequently if you are over 81 years  old. Skin check. Lung cancer screening. You may have this screening every year starting at age 43 if you have a 30-pack-year history of smoking and currently smoke or have quit within the past 15 years. Fecal occult blood test (FOBT) of the stool. You may have this test every year starting at age 57. Flexible sigmoidoscopy or colonoscopy. You may have a sigmoidoscopy every 5 years or a colonoscopy every 10 years starting at age 88. Prostate cancer screening. Recommendations will vary depending on your family history and other risks. Hepatitis C blood test. Hepatitis B blood test. Sexually transmitted disease (STD) testing. Diabetes screening. This is done by checking your blood sugar (glucose) after you have not eaten for a while (fasting). You may have this done every 1-3 years. Discuss your test results, treatment options, and if necessary, the need for more tests with your health care provider. Vaccines  Your health care provider may recommend certain vaccines, such as: Influenza vaccine. This is recommended every year. Tetanus, diphtheria, and acellular pertussis (Tdap, Td) vaccine. You may need a Td booster every 10 years. Zoster vaccine. You may need this after age 13. Pneumococcal 13-valent conjugate (PCV13) vaccine. You may need this if you have certain conditions and have not been vaccinated. Pneumococcal polysaccharide (PPSV23) vaccine. You may need one or two doses if you smoke cigarettes or if you have certain conditions. Talk to your health care provider about which screenings and vaccines you need and how often you need them. This information is not intended to replace advice given to you by your health care provider. Make sure you discuss any questions you have with your health care provider. Document Released: 06/20/2015 Document Revised: 02/11/2016 Document Reviewed: 03/25/2015 Elsevier Interactive  Patient Education  2017 [place] Prevention in the [facility] can  cause injuries. They can happen to people of all ages. There are many things you can do to make your home safe and to help prevent falls. What can I do on the outside of my home? Regularly fix the edges of walkways and driveways and fix any cracks. Remove anything that might make you trip as you walk through a door, such as a raised step or threshold. Trim any bushes or trees on the path to your home. Use bright outdoor lighting. Clear any walking paths of anything that might make someone trip, such as rocks or tools. Regularly check to see if handrails are loose or broken. Make sure that both sides of any steps have handrails. Any raised decks and porches should have guardrails on the edges. Have any leaves, snow, or ice cleared regularly. Use sand or salt on walking paths during winter. Clean up any spills in your garage right away. This includes oil or grease spills. What can I do in the bathroom? Use night lights. Install grab bars by the toilet and in the tub and shower. Do not use towel bars as grab bars. Use non-skid mats or decals in the tub or shower. If you need to sit down in the shower, use a plastic, non-slip stool. Keep the floor dry. Clean up any water that spills on the floor as soon as it happens. Remove soap buildup in the tub or shower regularly. Attach bath mats securely with double-sided non-slip rug tape. Do not have throw rugs and other things on the floor that can make you trip. What can I do in the bedroom? Use night lights. Make sure that you have a light by your bed that is easy to reach. Do not use any sheets or blankets that are too big for your bed. They should not hang down onto the floor. Have a firm chair that has side arms. You can use this for support while you get dressed. Do not have throw rugs and other things on the floor that can make you trip. What can I do in the kitchen? Clean up any spills right away. Avoid walking on wet floors. Keep items  that you use a lot in easy-to-reach places. If you need to reach something above you, use a strong step stool that has a grab bar. Keep electrical cords out of the way. Do not use floor polish or wax that makes floors slippery. If you must use wax, use non-skid floor wax. Do not have throw rugs and other things on the floor that can make you trip. What can I do with my stairs? Do not leave any items on the stairs. Make sure that there are handrails on both sides of the stairs and use them. Fix handrails that are broken or loose. Make sure that handrails are as long as the stairways. Check any carpeting to make sure that it is firmly attached to the stairs. Fix any carpet that is loose or worn. Avoid having throw rugs at the top or bottom of the stairs. If you do have throw rugs, attach them to the floor with carpet tape. Make sure that you have a light switch at the top of the stairs and the bottom of the stairs. If you do not have them, ask someone to add them for you. What else can I do to help prevent falls? Wear shoes that: Do not have high heels.  Have rubber bottoms. Are comfortable and fit you well. Are closed at the toe. Do not wear sandals. If you use a stepladder: Make sure that it is fully opened. Do not climb a closed stepladder. Make sure that both sides of the stepladder are locked into place. Ask someone to hold it for you, if possible. Clearly mark and make sure that you can see: Any grab bars or handrails. First and last steps. Where the edge of each step is. Use tools that help you move around (mobility aids) if they are needed. These include: Canes. Walkers. Scooters. Crutches. Turn on the lights when you go into a dark area. Replace any light bulbs as soon as they burn out. Set up your furniture so you have a clear path. Avoid moving your furniture around. If any of your floors are uneven, fix them. If there are any pets around you, be aware of where they  are. Review your medicines with your doctor. Some medicines can make you feel dizzy. This can increase your chance of falling. Ask your doctor what other things that you can do to help prevent falls. This information is not intended to replace advice given to you by your health care provider. Make sure you discuss any questions you have with your health care provider. Document Released: 03/20/2009 Document Revised: 10/30/2015 Document Reviewed: 06/28/2014 Elsevier Interactive Patient Education  2017 [employer].

## 2022-02-23 NOTE — Progress Notes (Signed)
Subjective:   Justin [name] is a 56 y.o. male who presents for Medicare Annual/Subsequent preventive examination.  I connected with  [name] on 02/23/22 by a telephone enabled telemedicine application and verified that I am speaking with the correct person using two identifiers.   I discussed the limitations of evaluation and management by telemedicine. The patient expressed understanding and agreed to proceed.  Patient location: home  Provider location: Tele-Health-home     Review of Systems     Cardiac Risk Factors include: advanced age (>44mn, >>83women);obesity (BMI >30kg/m2);sedentary lifestyle;male gender     Objective:    Today's Vitals   02/23/22 1007  PainSc: 7    There is no height or weight on file to calculate BMI.     02/23/2022   10:10 AM 02/22/2022    1:15 PM 12/14/2021    2:31 PM 06/11/2021   10:12 AM 02/21/2021    1:11 PM 07/28/2020    2:09 PM 12/31/2019    2:36 PM  Advanced Directives  Does Patient Have a Medical Advance Directive? No No No No No No No  Would patient like information on creating a medical advance directive? No - Patient declined  No - Guardian declined No - Patient declined No - Patient declined      Current Medications (verified) Outpatient Encounter Medications as of 02/23/2022  Medication Sig   albuterol (VENTOLIN HFA) 108 (90 Base) MCG/ACT inhaler TAKE 2 PUFFS BY MOUTH EVERY 6 HOURS AS NEEDED FOR WHEEZE OR SHORTNESS OF BREATH   apixaban (ELIQUIS) 5 MG TABS tablet Take 1 tablet (5 mg total) by mouth 2 (two) times daily.   atorvastatin (LIPITOR) 80 MG tablet TAKE 1 TABLET BY MOUTH EVERY DAY   ibuprofen (ADVIL) 800 MG tablet Take 1 tablet (800 mg total) by mouth every 8 (eight) hours as needed for mild pain.   [START ON 03/02/2022] oxyCODONE (OXY IR/ROXICODONE) 5 MG immediate release tablet Take 1 tablet (5 mg total) by mouth every 8 (eight) hours as needed for severe pain. Must last 30 days   [START ON 04/01/2022] oxyCODONE  (OXY IR/ROXICODONE) 5 MG immediate release tablet Take 1 tablet (5 mg total) by mouth every 8 (eight) hours as needed for severe pain. Must last 30 days   [START ON 05/01/2022] oxyCODONE (OXY IR/ROXICODONE) 5 MG immediate release tablet Take 1 tablet (5 mg total) by mouth every 8 (eight) hours as needed for severe pain. Must last 30 days   Calcium Carbonate-Vit D-Min (GNP CALCIUM 1200) 1200-1000 MG-UNIT CHEW Chew 1,200 mg by mouth daily with breakfast. Take in combination with vitamin D and magnesium.   No facility-administered encounter medications on file as of 02/23/2022.    Allergies (verified) Patient has no known allergies.   History: Past Medical History:  Diagnosis Date   Arm vein blood clot    right   Coronary artery disease    [place]   History of right arm; now right lower shotty   GERD (gastroesophageal reflux disease)    Rib pain on right side 08/04/2016   RT rib pain that radiates around to the back. Level 8 of 10.  Constant. Started 1 mo ago, 1 week after DVT.   Vascular thoracic outlet syndrome    Right    Past Surgical History:  Procedure Laterality Date   CAROTID ANGIOGRAM     COLONOSCOPY WITH PROPOFOL N/A 10/10/2019   Procedure: COLONOSCOPY WITH PROPOFOL;  Surgeon: AVicente Males  Bailey Mech, MD;  Location: [facility];  Service: Gastroenterology;  Laterality: N/A;   KNEE CARTILAGE SURGERY     Performed for cracked patella and torn meniscus   Right upper extremity thrombectomy  2012   SCALENOTOMY W/O RESECTION CERVICAL RIB  2012   4 right-sided thoracic outlet syndrome   Family History  Problem Relation Age of Onset   Heart disease Father    Breast cancer Father 81   Diabetes Father    Heart attack Father    Cancer Maternal Grandmother    Leukemia Maternal Grandfather    Prostate cancer Maternal Uncle    Social History   Socioeconomic History   Marital status: Married    Spouse name: Not on file   Number of children:  Not on file   Years of education: Not on file   Highest education level: Not on file  Occupational History    Comment: [employer]  Tobacco Use   Smoking status: Former    Packs/day: 0.50    Types: Cigarettes   Smokeless tobacco: Never   Tobacco comments:    quit smoking in 2010  Vaping Use   Vaping Use: Never used  Substance and Sexual Activity   Alcohol use: No   Drug use: No   Sexual activity: Yes    Birth control/protection: None  Other Topics Concern   Not on file  Social History Narrative   He works full-time at [employer].    He is married. Nonsmoker who is not currently. Alcohol.   Social Determinants of Health   Financial Resource Strain: Low Risk  (02/23/2022)   Overall Financial Resource Strain (CARDIA)    Difficulty of Paying Living Expenses: Not hard at all  Food Insecurity: No Food Insecurity (02/23/2022)   Hunger Vital Sign    Worried About Running Out of Food in the Last Year: Never true    Ran Out of Food in the Last Year: Never true  Transportation Needs: No Transportation Needs (02/23/2022)   PRAPARE - Hydrologist (Medical): No    Lack of Transportation (Non-Medical): No  Physical Activity: Inactive (02/23/2022)   Exercise Vital Sign    Days of Exercise per Week: 0 days    Minutes of Exercise per Session: 0 min  Stress: No Stress Concern Present (02/23/2022)   [place]    Feeling of Stress : Only a little  Social Connections: Moderately Integrated (02/23/2022)   Social Connection and Isolation Panel [NHANES]    Frequency of Communication with Friends and Family: Twice a week    Frequency of Social Gatherings with Friends and Family: Twice a week    Attends Religious Services: More than 4 times per year    Active Member of [employer] or Organizations: No    Attends Music therapist: Never    Marital Status: Married    Tobacco Counseling Counseling given: Not  Answered Tobacco comments: quit smoking in 2010   Clinical Intake:  Pre-visit preparation completed: Yes  Pain : 0-10 Pain Score: 7  Pain Type: Chronic pain Pain Location: Other (Comment) (around ribs radiates to back) Pain Orientation: Right Pain Descriptors / Indicators: Constant, Burning, Aching Pain Onset: More than a month ago Pain Frequency: Constant Pain Relieving Factors: oxycodone  Pain Relieving Factors: oxycodone  Diabetes: No  How often do you need to have someone help you when you read instructions, pamphlets, or other written materials from your doctor or pharmacy?:  1 - Never  Diabetic?  no  Interpreter Needed?: No  Information entered by :: Leroy Kennedy LPN   Activities of Daily Living    02/23/2022   10:19 AM  In your present state of health, do you have any difficulty performing the following activities:  Hearing? 0  Vision? 0  Difficulty concentrating or making decisions? 0  Walking or climbing stairs? 1  Dressing or bathing? 0  Doing errands, shopping? 0  Preparing Food and eating ? N  Using the Toilet? N  In the past six months, have you accidently leaked urine? N  Do you have problems with loss of bowel control? N  Managing your Medications? N  Managing your Finances? N  Housekeeping or managing your Housekeeping? N    Patient Care Team: Jon Billings, NP as PCP - General Schnier, Dolores Lory, MD (Vascular Surgery) Sindy Guadeloupe, MD as Consulting Physician (Oncology)  Indicate any recent Medical Services you may have received from other than [facility] providers in the past year (date may be approximate).     Assessment:   This is a routine wellness examination for [place].  Hearing/Vision screen Hearing Screening - Comments:: No trouble hearing Vision Screening - Comments:: [facility] [place] Not up to date  Dietary issues and exercise activities discussed: Current Exercise Habits: The patient does not participate in regular exercise  at present   Goals Addressed             This Visit's Progress    Patient Stated       Better endurance when walking       Depression Screen    02/23/2022   10:13 AM 02/22/2022    1:15 PM 07/30/2021    3:13 PM 02/21/2021    1:08 PM 07/28/2020    2:08 PM 01/23/2020   11:46 AM 12/31/2019    2:36 PM  PHQ 2/9 Scores  PHQ - 2 Score 0 0 1 0 0 0 0  PHQ- 9 Score '4  3   2   '$ Exception Documentation       Patient refusal    Fall Risk    02/23/2022   10:08 AM 02/22/2022    1:15 PM 11/23/2021    2:23 PM 08/31/2021    1:39 PM 07/30/2021    3:13 PM  Fall Risk   Falls in the past year? 0 0 0 0 0  Number falls in past yr: 0    0  Injury with Fall? 0    0  Risk for fall due to :     No Fall Risks  Follow up Falls evaluation completed;Education provided;Falls prevention discussed    Falls evaluation completed    FALL RISK PREVENTION PERTAINING TO THE HOME:  Any stairs in or around the home? No  If so, are there any without handrails? No  Home free of loose throw rugs in walkways, pet beds, electrical cords, etc? Yes  Adequate lighting in your home to reduce risk of falls? Yes   ASSISTIVE DEVICES UTILIZED TO PREVENT FALLS:  Life alert? No  Use of a cane, walker or w/c? No  Grab bars in the bathroom? No  Shower chair or bench in shower? No  Elevated toilet seat or a handicapped toilet? No   TIMED UP AND GO:  Was the test performed? No .    Cognitive Function:        02/23/2022   10:09 AM 02/21/2021    1:14 PM  6CIT Screen  What Year? 0 points 0 points  What month? 0 points 0 points  What time? 0 points 0 points  Count back from 20 0 points 0 points  Months in reverse 0 points 0 points  Repeat phrase 0 points 0 points  Total Score 0 points 0 points    Immunizations Immunization History  Administered Date(s) Administered   PFIZER(Purple Top)SARS-COV-2 Vaccination 09/30/2019, 10/21/2019   Tdap 09/17/2014   Zoster Recombinat (Shingrix) 01/02/2019    TDAP status: Up  to date  Flu Vaccine status: Declined, Education has been provided regarding the importance of this vaccine but patient still declined. Advised may receive this vaccine at local pharmacy or Health Dept. Aware to provide a copy of the vaccination record if obtained from local pharmacy or Health Dept. Verbalized acceptance and understanding.    Covid-19 vaccine status: Information provided on how to obtain vaccines.   Qualifies for Shingles Vaccine? Yes   Zostavax completed No   Shingrix Completed?: No.    Education has been provided regarding the importance of this vaccine. Patient has been advised to call insurance company to determine out of pocket expense if they have not yet received this vaccine. Advised may also receive vaccine at local pharmacy or Health Dept. Verbalized acceptance and understanding.  Screening Tests Health Maintenance  Topic Date Due   COVID-19 Vaccine (3 - Pfizer risk series) 03/11/2022 (Originally 11/18/2019)   Zoster Vaccines- Shingrix (2 of 2) 05/25/2022 (Originally 02/27/2019)   INFLUENZA VACCINE  09/05/2022 (Originally 01/05/2022)   TETANUS/TDAP  09/16/2024   COLONOSCOPY (Pts 45-109yr Insurance coverage will need to be confirmed)  10/09/2029   Hepatitis C Screening  Completed   HIV Screening  Completed   HPV VACCINES  Aged Out    Health Maintenance  There are no preventive care reminders to display for this patient.   Colorectal cancer screening: Type of screening: Colonoscopy. Completed 2021. Repeat every 10 years  Lung Cancer Screening: (Low Dose CT Chest recommended if Age 57-80years, 30 pack-year currently smoking OR have quit w/in 15years.) does not qualify.   Lung Cancer Screening Referral:   Additional Screening:  Hepatitis C Screening: does not qualify; Completed 2021  Vision Screening: Recommended annual ophthalmology exams for early detection of glaucoma and other disorders of the eye. Is the patient up to date with their annual eye exam?   No  Who is the provider or what is the name of the office in which the patient attends [facility] exams[facility] [place] not established with a provider, would they like to be referred to a provider to establish care? No .   Dental Screening: Recommended annual dental exams for proper oral hygiene  Community Resource Referral / Chronic Care Management: CRR required this visit?  No   CCM required this visit?  No      Plan:     I have personally reviewed and noted the following in the patient's chart:   Medical and social history Use of alcohol, tobacco or illicit drugs  Current medications and supplements including opioid prescriptions. Patient is not currently taking opioid prescriptions. Functional ability and status Nutritional status Physical activity Advanced directives List of other physicians Hospitalizations, surgeries, and ER visits in previous 12 months Vitals Screenings to include cognitive, depression, and falls Referrals and appointments  In addition, I have reviewed and discussed with patient certain preventive protocols, quality metrics, and best practice recommendations. A written personalized care plan for preventive services as well as general  preventive health recommendations were provided to patient.     Leroy Kennedy, LPN   [id number]   Nurse Notes:

## 2022-02-25 DIAGNOSIS — M25572 Pain in left ankle and joints of left foot: Secondary | ICD-10-CM | POA: Diagnosis not present

## 2022-03-02 DIAGNOSIS — M25572 Pain in left ankle and joints of left foot: Secondary | ICD-10-CM | POA: Diagnosis not present

## 2022-03-09 ENCOUNTER — Other Ambulatory Visit: Payer: Self-pay | Admitting: *Deleted

## 2022-03-09 MED ORDER — APIXABAN 5 MG PO TABS: 5.0000 mg | ORAL_TABLET | Freq: Two times a day (BID) | ORAL | 0 refills | Status: DC

## 2022-03-17 DIAGNOSIS — M25572 Pain in left ankle and joints of left foot: Secondary | ICD-10-CM | POA: Diagnosis not present

## 2022-03-17 DIAGNOSIS — S8001XA Contusion of right knee, initial encounter: Secondary | ICD-10-CM | POA: Diagnosis not present

## 2022-03-17 DIAGNOSIS — M7662 Achilles tendinitis, left leg: Secondary | ICD-10-CM | POA: Diagnosis not present

## 2022-04-08 ENCOUNTER — Encounter: Payer: Self-pay | Admitting: Nurse Practitioner

## 2022-04-08 ENCOUNTER — Ambulatory Visit (INDEPENDENT_AMBULATORY_CARE_PROVIDER_SITE_OTHER): Payer: BC Managed Care – PPO | Admitting: Nurse Practitioner

## 2022-04-08 VITALS — BP 138/62 | HR 60 | Temp 98.7°F | Ht 72.0 in | Wt 251.4 lb

## 2022-04-08 DIAGNOSIS — E782 Mixed hyperlipidemia: Secondary | ICD-10-CM

## 2022-04-08 DIAGNOSIS — G894 Chronic pain syndrome: Secondary | ICD-10-CM

## 2022-04-08 DIAGNOSIS — N400 Enlarged prostate without lower urinary tract symptoms: Secondary | ICD-10-CM

## 2022-04-08 DIAGNOSIS — R7301 Impaired fasting glucose: Secondary | ICD-10-CM

## 2022-04-08 DIAGNOSIS — E559 Vitamin D deficiency, unspecified: Secondary | ICD-10-CM

## 2022-04-08 MED ORDER — ATORVASTATIN CALCIUM 80 MG PO TABS: 80.0000 mg | ORAL_TABLET | Freq: Every day | ORAL | 1 refills | Status: AC

## 2022-04-08 NOTE — Assessment & Plan Note (Signed)
Labs ordered at visit today.  Will make recommendations based on lab results.   

## 2022-04-08 NOTE — Assessment & Plan Note (Signed)
Chronic. Continues to follow up with pain clinic.

## 2022-04-08 NOTE — Assessment & Plan Note (Signed)
Chronic.  Controlled.  Continue with current medication regimen of Atorvastatin '80mg'$ .  Refills sent today.  Labs ordered today.  Return to clinic in 6 months for reevaluation.  Call sooner if concerns arise.

## 2022-04-08 NOTE — Patient Instructions (Signed)
Melatonin '3mg'$  nightly Tylenol PM

## 2022-04-08 NOTE — Progress Notes (Signed)
BP 138/62   Pulse 60   Temp 98.7 F (37.1 C) (Oral)   Ht 6' (1.829 m)   Wt 251 lb 6.4 oz (114 kg)   SpO2 98%   BMI 34.10 kg/m    Subjective:    Patient ID: Justin [name], male    DOB: 1965-10-19, 56 y.o.   MRN: [id number]  HPI: Justin [name] is a 56 y.o. male  Chief Complaint  Patient presents with   Annual Exam   HYPERLIPIDEMIA Hyperlipidemia status: excellent compliance Satisfied with current treatment?  yes Side effects:  no Medication compliance: excellent compliance Past cholesterol meds: atorvastain (lipitor) Supplements: none Aspirin:  no The 10-year ASCVD risk score (Arnett DK, et al., 2019) is: 7.8%   Values used to calculate the score:     Age: 80 years     Sex: Male     Is Non-Hispanic African American: Yes     Diabetic: No     Tobacco smoker: No     Systolic Blood Pressure: 626 mmHg     Is BP treated: No     HDL Cholesterol: 55 mg/dL     Total Cholesterol: 206 mg/dL Chest pain:  no Coronary artery disease:  no Family history CAD:  no Family history early CAD:  no   Denies HA, CP, dizziness, palpitations, visual changes, SOB, and lower extremity swelling.     Relevant past medical, surgical, family and social history reviewed and updated as indicated. Interim medical history since our last visit reviewed. Allergies and medications reviewed and updated.  Review of Systems  Eyes:  Negative for visual disturbance.  Respiratory:  Negative for chest tightness and shortness of breath.   Cardiovascular:  Negative for chest pain, palpitations and leg swelling.  Neurological:  Negative for dizziness, light-headedness and headaches.    Per HPI unless specifically indicated above     Objective:    BP 138/62   Pulse 60   Temp 98.7 F (37.1 C) (Oral)   Ht 6' (1.829 m)   Wt 251 lb 6.4 oz (114 kg)   SpO2 98%   BMI 34.10 kg/m   Wt Readings from Last 3 Encounters:  04/08/22 251 lb 6.4 oz (114 kg)  02/22/22 250 lb (113.4 kg)  12/14/21 257  lb (116.6 kg)    Physical Exam Vitals and nursing note reviewed.  Constitutional:      General: He is not in acute distress.    Appearance: Normal appearance. He is not ill-appearing, toxic-appearing or diaphoretic.  HENT:     Head: Normocephalic.     Right Ear: External ear normal.     Left Ear: External ear normal.     Nose: Nose normal. No congestion or rhinorrhea.     Mouth/Throat:     Mouth: Mucous membranes are moist.  Eyes:     General:        Right eye: No discharge.        Left eye: No discharge.     Extraocular Movements: Extraocular movements intact.     Conjunctiva/sclera: Conjunctivae normal.     Pupils: Pupils are equal, round, and reactive to light.  Cardiovascular:     Rate and Rhythm: Normal rate and regular rhythm.     Heart sounds: No murmur heard. Pulmonary:     Effort: Pulmonary effort is normal. No respiratory distress.     Breath sounds: Examination of the right-lower field reveals decreased breath sounds. Examination of the left-lower field reveals decreased breath  sounds. Decreased breath sounds present. No wheezing, rhonchi or rales.  Abdominal:     General: Abdomen is flat. Bowel sounds are normal.  Musculoskeletal:     Cervical back: Normal range of motion and neck supple.  Skin:    General: Skin is warm and dry.     Capillary Refill: Capillary refill takes less than 2 seconds.  Neurological:     General: No focal deficit present.     Mental Status: He is alert and oriented to person, place, and time.  Psychiatric:        Mood and Affect: Mood normal.        Behavior: Behavior normal.        Thought Content: Thought content normal.        Judgment: Judgment normal.     Results for orders placed or performed in visit on 12/14/21  Comprehensive metabolic panel  Result Value Ref Range   Sodium 134 (L) 135 - 145 mmol/L   Potassium 3.7 3.5 - 5.1 mmol/L   Chloride 104 98 - 111 mmol/L   CO2 26 22 - 32 mmol/L   Glucose, Bld 119 (H) 70 - 99 mg/dL    BUN 13 6 - 20 mg/dL   Creatinine, Ser 1.34 (H) 0.61 - 1.24 mg/dL   Calcium 8.6 (L) 8.9 - 10.3 mg/dL   Total Protein 6.9 6.5 - 8.1 g/dL   Albumin 3.7 3.5 - 5.0 g/dL   AST 22 15 - 41 U/L   ALT 22 0 - 44 U/L   Alkaline Phosphatase 93 38 - 126 U/L   Total Bilirubin 0.5 0.3 - 1.2 mg/dL   GFR, Estimated >60 >60 mL/min   Anion gap 4 (L) 5 - 15  CBC with Differential/Platelet  Result Value Ref Range   WBC 9.4 4.0 - 10.5 K/uL   RBC 4.84 4.22 - 5.81 MIL/uL   Hemoglobin 14.9 13.0 - 17.0 g/dL   HCT 45.3 39.0 - 52.0 %   MCV 93.6 80.0 - 100.0 fL   MCH 30.8 26.0 - 34.0 pg   MCHC 32.9 30.0 - 36.0 g/dL   RDW 12.3 11.5 - 15.5 %   Platelets 238 150 - 400 K/uL   nRBC 0.0 0.0 - 0.2 %   Neutrophils Relative % 62 %   Neutro Abs 5.8 1.7 - 7.7 K/uL   Lymphocytes Relative 27 %   Lymphs Abs 2.6 0.7 - 4.0 K/uL   Monocytes Relative 8 %   Monocytes Absolute 0.7 0.1 - 1.0 K/uL   Eosinophils Relative 3 %   Eosinophils Absolute 0.3 0.0 - 0.5 K/uL   Basophils Relative 0 %   Basophils Absolute 0.0 0.0 - 0.1 K/uL   WBC Morphology UNREMARKABLE    RBC Morphology UNREMARKABLE    Smear Review PLATELETS APPEAR ADEQUATE    Immature Granulocytes 0 %   Abs Immature Granulocytes 0.04 0.00 - 0.07 K/uL      Assessment & Plan:   Problem List Items Addressed This Visit       Endocrine   IFG (impaired fasting glucose) - Primary    Labs ordered at visit today.  Will make recommendations based on lab results.        Relevant Orders   Comprehensive metabolic panel   HgB V5I     Other   Chronic pain syndrome (Chronic)    Chronic. Continues to follow up with pain clinic.       Vitamin D deficiency    Labs ordered at  visit today.  Will make recommendations based on lab results.        Relevant Orders   Vitamin D (25 hydroxy)   Mixed hyperlipidemia    Chronic.  Controlled.  Continue with current medication regimen of Atorvastatin '80mg'$ .  Refills sent today.  Labs ordered today.  Return to clinic in 6  months for reevaluation.  Call sooner if concerns arise.        Relevant Medications   atorvastatin (LIPITOR) 80 MG tablet   Other Visit Diagnoses     Benign prostatic hyperplasia, unspecified whether lower urinary tract symptoms present       Relevant Orders   PSA        Follow up plan: Return in about 6 months (around 10/07/2022) for HTN, HLD, DM2 FU.

## 2022-04-09 LAB — COMPREHENSIVE METABOLIC PANEL
ALT: 19 IU/L (ref 0–44)
AST: 16 IU/L (ref 0–40)
Albumin/Globulin Ratio: 1.4 (ref 1.2–2.2)
Albumin: 4 g/dL (ref 3.8–4.9)
Alkaline Phosphatase: 126 IU/L — ABNORMAL HIGH (ref 44–121)
BUN/Creatinine Ratio: 7 — ABNORMAL LOW (ref 9–20)
BUN: 9 mg/dL (ref 6–24)
Bilirubin Total: 0.3 mg/dL (ref 0.0–1.2)
CO2: 23 mmol/L (ref 20–29)
Calcium: 9.4 mg/dL (ref 8.7–10.2)
Chloride: 102 mmol/L (ref 96–106)
Creatinine, Ser: 1.28 mg/dL — ABNORMAL HIGH (ref 0.76–1.27)
Globulin, Total: 2.9 g/dL (ref 1.5–4.5)
Glucose: 123 mg/dL — ABNORMAL HIGH (ref 70–99)
Potassium: 4 mmol/L (ref 3.5–5.2)
Sodium: 139 mmol/L (ref 134–144)
Total Protein: 6.9 g/dL (ref 6.0–8.5)
eGFR: 66 mL/min/{1.73_m2} (ref >59)

## 2022-04-09 LAB — HEMOGLOBIN A1C
Est. average glucose Bld gHb Est-mCnc: 126 mg/dL
Hgb A1c MFr Bld: 6 % — ABNORMAL HIGH (ref 4.8–5.6)

## 2022-04-09 LAB — PSA: Prostate Specific Ag, Serum: 0.5 ng/mL (ref 0.0–4.0)

## 2022-04-09 LAB — VITAMIN D 25 HYDROXY (VIT D DEFICIENCY, FRACTURES): Vit D, 25-Hydroxy: 48.5 ng/mL (ref 30.0–100.0)

## 2022-04-09 NOTE — Progress Notes (Signed)
Hi Arthuro.  Your lab work shows that his prostate cancer screening was normal.  Vitamin D was also in normal range. A1c is well controlled at 6.0. Kidney function has improved from prior.  Follow up as discussed at office visit.

## 2022-05-23 NOTE — Progress Notes (Unsigned)
PROVIDER NOTE: Information contained herein reflects review and annotations entered in association with encounter. Interpretation of such information and data should be left to medically-trained personnel. Information provided to patient can be located elsewhere in the medical record under "Patient Instructions". Document created using STT-dictation technology, any transcriptional errors that may result from process are unintentional.    Patient: Justin [name]  Service Category: E/M  Provider: Gaspar Cola, MD  DOB: 07-13-1965  DOS: 05/24/2022  Referring Provider: Jon Billings, NP  MRN: [id number]  Specialty: Interventional Pain Management  PCP: Jon Billings, NP  Type: Established Patient  Setting: Ambulatory outpatient    Location: Office  Delivery: Face-to-face     HPI  [name], a 56 y.o. year old male, is here today because of his No primary diagnosis found.. [name] primary complain today is No chief complaint on file. Last encounter: My last encounter with him was on 02/22/2022. Pertinent problems: 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 Pain Assessment: Severity of   is reported as a  /10. Location:    / . Onset:  . Quality:  . Timing:  . Modifying factor(s):  Marland Kitchen Vitals:  vitals were not taken for this visit.  BMI: Estimated body mass index is 34.1 kg/m as calculated from the following:   Height as of 04/08/22: 6' (1.829 m).   Weight as of 04/08/22: 251 lb 6.4 oz (114 kg).  Reason for encounter: medication management. ***  Pharmacotherapy Assessment   Analgesic: Oxycodone IR 5 mg, 1 tab PO q 8 hrs (15 mg/day of oxycodone)  MME/day: 22.5 mg/day.   Monitoring: [place] PMP: PDMP reviewed during this encounter.       Pharmacotherapy: No side-effects or adverse reactions reported. Compliance: No problems identified. Effectiveness: Clinically acceptable.  No notes on file  No results found for: "CBDTHCR" No results found for: "D8THCCBX" No results found for: "D9THCCBX"  UDS:  Summary  Date Value Ref Range Status  11/23/2021 Note  Final    Comment:    ==================================================================== [employer] [facility] (MW) ==================================================================== Test                             Result       Flag       Units  Drug Present and Declared for Prescription Verification   Oxycodone                      1545         EXPECTED   ng/mg creat   Oxymorphone                    858          EXPECTED   ng/mg creat   Noroxycodone                   1398         EXPECTED   ng/mg creat   Noroxymorphone                 231  EXPECTED   ng/mg creat    Sources of oxycodone are scheduled prescription medications.    Oxymorphone, noroxycodone, and noroxymorphone are expected    metabolites of oxycodone. Oxymorphone is also available as a    scheduled prescription medication.  ==================================================================== Test                      Result    Flag   Units      Ref Range   Creatinine              121              mg/dL      >=20 ==================================================================== Declared Medications:  The flagging and interpretation on this report are based on the  following declared medications.  Unexpected results may arise from  inaccuracies in the declared medications.   **Note: The testing scope of this panel includes these medications:   Oxycodone   **Note: The testing scope of this panel does not include the  following  reported medications:   Acetaminophen (Tylenol)  Albuterol (Ventolin HFA)  Apixaban (Eliquis)  Atorvastatin (Lipitor)  Calcium ==================================================================== For clinical consultation, please call [phone]. ====================================================================       ROS  Constitutional: Denies any fever or chills Gastrointestinal: No reported hemesis, hematochezia, vomiting, or acute GI distress Musculoskeletal: Denies any acute onset joint swelling, redness, loss of ROM, or weakness Neurological: No reported episodes of acute onset apraxia, aphasia, dysarthria, agnosia, amnesia, paralysis, loss of coordination, or loss of consciousness  Medication Review  GNP Calcium 1200, albuterol, apixaban, atorvastatin, ibuprofen, and oxyCODONE  History Review  Allergy: [name] has No Known Allergies. Drug: [name]  reports no history of drug use. Alcohol:  reports no history of alcohol use. Tobacco:  reports that he has quit smoking. His smoking use included cigarettes. He smoked an average of .5 packs per day. He has never used smokeless tobacco. Social: [name]  reports that he has quit smoking. His smoking use included cigarettes. He smoked an average of .5 packs per day. He has never used smokeless tobacco. He reports that he does not drink alcohol and does not use drugs. Medical:  has a past medical history of Arm vein blood clot, Coronary artery disease, DVT (deep venous thrombosis) ([place]) (2012; January 2016), GERD (gastroesophageal reflux disease), Rib pain on right side (08/04/2016), and Vascular thoracic outlet syndrome. Surgical: [name]  has a past surgical history that includes Knee cartilage surgery; Carotid angiogram; Scalenotomy w/o resection cervical rib (2012); Right upper extremity thrombectomy (2012); and Colonoscopy with propofol (N/A, 10/10/2019). Family: family history includes Breast cancer (age of  onset: 38) in his father; Cancer in his maternal grandmother; Diabetes in his father; Heart attack in his father; Heart disease in his father; Leukemia in his maternal grandfather; Prostate cancer in his maternal uncle.  Laboratory Chemistry Profile   Renal Lab Results  Component Value Date   BUN 9 04/08/2022   CREATININE 1.28 (H) 04/08/2022   BCR 7 (L) 04/08/2022   GFRAA >60 02/19/2020   GFRNONAA >60 12/14/2021    Hepatic Lab Results  Component Value Date   AST 16 04/08/2022   ALT 19 04/08/2022   ALBUMIN 4.0 04/08/2022   ALKPHOS 126 (H) 04/08/2022   LIPASE 90 03/02/2014    Electrolytes Lab Results  Component Value Date   NA 139 04/08/2022   K 4.0 04/08/2022   CL 102 04/08/2022   CALCIUM 9.4 04/08/2022  MG 2.0 07/28/2017    Bone Lab Results  Component Value Date   VD25OH 48.5 04/08/2022   25OHVITD1 15 (L) 06/29/2018   25OHVITD2 8.4 06/29/2018   25OHVITD3 6.8 06/29/2018    Inflammation (CRP: Acute Phase) (ESR: Chronic Phase) Lab Results  Component Value Date   CRP 12 (H) 06/29/2018   ESRSEDRATE 27 06/29/2018   LATICACIDVEN 0.9 07/20/2016         Note: Above Lab results reviewed.  Recent Imaging Review  DG Ankle Complete Left CLINICAL DATA:  Ankle and Achilles tendon pain, initial encounter  EXAM: LEFT ANKLE COMPLETE - 3+ VIEW  COMPARISON:  None Available.  FINDINGS: Tarsal degenerative changes are seen. No acute fracture or dislocation is noted. No soft tissue abnormality is seen.  IMPRESSION: No acute abnormality noted.  Electronically Signed   By: Inez Catalina M.D.   On: 12/04/2021 21:13 Note: Reviewed        Physical Exam  General appearance: Well nourished, well developed, and well hydrated. In no apparent acute distress Mental status: Alert, oriented x 3 (person, place, & time)       Respiratory: No evidence of acute respiratory distress Eyes: PERLA Vitals: There were no vitals taken for this visit. BMI: Estimated body mass index is 34.1  kg/m as calculated from the following:   Height as of 04/08/22: 6' (1.829 m).   Weight as of 04/08/22: 251 lb 6.4 oz (114 kg). Ideal: Patient weight not recorded  Assessment   Diagnosis Status  No diagnosis found. Controlled Controlled Controlled   Updated Problems: No problems updated.  Plan of Care  Problem-specific:  No problem-specific Assessment & Plan notes found for this encounter.  Mr. LAN MCNEILL has a current medication list which includes the following long-term medication(s): albuterol, apixaban, atorvastatin, gnp calcium 1200, oxycodone, oxycodone, and oxycodone.  Pharmacotherapy (Medications Ordered): No orders of the defined types were placed in this encounter.  Orders:  No orders of the defined types were placed in this encounter.  Follow-up plan:   No follow-ups on file.     Interventional Therapies  Risk  Complexity Considerations:   Estimated body mass index is 34.18 kg/m as calculated from the following:   Height as of this encounter: 6' (1.829 m).   Weight as of this encounter: 252 lb (114.3 kg). WNL   Planned  Pending:      Under consideration:   Diagnostic right thoracic SNRB  Diagnostic right thoracic facet block  Possible right thoracic facet medial branch RFA    Completed:   Palliative right T6, T7, T8, & T9 intercostal NB x2 (10/11/2017) (100/100/50/50)  Palliative right T6, T7, T8, & T9 intercostal nerve RFA x1 (12/29/17)    Therapeutic  Palliative (PRN) options:   Palliative right T6, T7, T8, & T9 intercostal NB #3  Palliative right T6, T7, T8, & T9 intercostal nerve RFA #2       Recent Visits Date Type Provider Dept  02/22/22 Office Visit Milinda Pointer, MD [facility]  Showing recent visits within past 90 days and meeting all other requirements Future Appointments Date Type Provider Dept  05/24/22 Appointment Milinda Pointer, MD [facility]  Showing future appointments within next 90 days  and meeting all other requirements  I discussed the assessment and treatment plan with the patient. The patient was provided an opportunity to ask questions and all were answered. The patient agreed with the plan and demonstrated an understanding of the instructions.  Patient advised to  call back or seek an in-person evaluation if the symptoms or condition worsens.  Duration of encounter: *** minutes.  Total time on encounter, as per AMA guidelines included both the face-to-face and non-face-to-face time personally spent by the physician and/or other qualified health care professional(s) on the day of the encounter (includes time in activities that require the physician or other qualified health care professional and does not include time in activities normally performed by clinical staff). Physician's time may include the following activities when performed: preparing to see the patient (eg, review of tests, pre-charting review of records) obtaining and/or reviewing separately obtained history performing a medically appropriate examination and/or evaluation counseling and educating the patient/family/caregiver ordering medications, tests, or procedures referring and communicating with other health care professionals (when not separately reported) documenting clinical information in the electronic or other health record independently interpreting results (not separately reported) and communicating results to the patient/ family/caregiver care coordination (not separately reported)  Note by: Gaspar Cola, MD Date: 05/24/2022; Time: 10:16 AM

## 2022-05-24 ENCOUNTER — Ambulatory Visit: Payer: BC Managed Care – PPO | Attending: Pain Medicine | Admitting: Pain Medicine

## 2022-05-24 ENCOUNTER — Encounter: Payer: Self-pay | Admitting: Pain Medicine

## 2022-05-24 ENCOUNTER — Other Ambulatory Visit: Payer: Self-pay

## 2022-05-24 VITALS — BP 122/49 | HR 98 | Temp 97.1°F | Ht 72.0 in | Wt 240.0 lb

## 2022-05-24 DIAGNOSIS — R1011 Right upper quadrant pain: Secondary | ICD-10-CM | POA: Insufficient documentation

## 2022-05-24 DIAGNOSIS — Z79891 Long term (current) use of opiate analgesic: Secondary | ICD-10-CM | POA: Diagnosis not present

## 2022-05-24 DIAGNOSIS — M5442 Lumbago with sciatica, left side: Secondary | ICD-10-CM | POA: Diagnosis not present

## 2022-05-24 DIAGNOSIS — G588 Other specified mononeuropathies: Secondary | ICD-10-CM | POA: Insufficient documentation

## 2022-05-24 DIAGNOSIS — G8912 Acute post-thoracotomy pain: Secondary | ICD-10-CM | POA: Insufficient documentation

## 2022-05-24 DIAGNOSIS — M546 Pain in thoracic spine: Secondary | ICD-10-CM | POA: Diagnosis not present

## 2022-05-24 DIAGNOSIS — G894 Chronic pain syndrome: Secondary | ICD-10-CM | POA: Diagnosis not present

## 2022-05-24 DIAGNOSIS — G8929 Other chronic pain: Secondary | ICD-10-CM | POA: Diagnosis not present

## 2022-05-24 DIAGNOSIS — R0781 Pleurodynia: Secondary | ICD-10-CM | POA: Insufficient documentation

## 2022-05-24 DIAGNOSIS — M5414 Radiculopathy, thoracic region: Secondary | ICD-10-CM | POA: Diagnosis not present

## 2022-05-24 DIAGNOSIS — Z79899 Other long term (current) drug therapy: Secondary | ICD-10-CM | POA: Diagnosis not present

## 2022-05-24 MED ORDER — OXYCODONE HCL 5 MG PO TABS: 5.0000 mg | ORAL_TABLET | Freq: Three times a day (TID) | ORAL | 0 refills | Status: DC | PRN

## 2022-05-24 MED ORDER — NALOXONE HCL 4 MG/0.1ML NA LIQD: 1.0000 | NASAL | 0 refills | Status: AC | PRN

## 2022-05-24 MED ORDER — APIXABAN 5 MG PO TABS: 5.0000 mg | ORAL_TABLET | Freq: Two times a day (BID) | ORAL | 0 refills | Status: DC

## 2022-05-24 NOTE — Patient Instructions (Signed)
____________________________________________________________________________________________  Patient Information update  To: All of our patients.  Re: Name change.  It has been made official that our current name, "[facility]"   will soon be changed to " [facility]".   The purpose of this change is to eliminate any confusion created by the concept of our practice being a "Medication Management Pain Clinic". In the past this has led to the misconception that we treat pain primarily by the use of prescription medications.  Nothing can be farther from the truth.   Understanding PAIN MANAGEMENT: To further understand what our practice does, you first have to understand that "Pain Management" is a subspecialty that requires additional training once a physician has completed their specialty training, which can be in either Anesthesia, Neurology, Psychiatry, or Physical Medicine and Rehabilitation (PMR). Each one of these contributes to the final approach taken by each physician to the management of their patient's pain. To be a "Pain Management Specialist" you must have first completed one of the specialty trainings below.  Anesthesiologists - trained in clinical pharmacology and interventional techniques such as nerve blockade and regional as well as central neuroanatomy. They are trained to block pain before, during, and after surgical interventions.  Neurologists - trained in the diagnosis and pharmacological treatment of complex neurological conditions, such as Multiple Sclerosis, Parkinson's, spinal cord injuries, and other systemic conditions that may be associated with symptoms that may include but are not limited to pain. They tend to rely primarily on the treatment of chronic pain using prescription medications.  Psychiatrist - trained in conditions affecting the psychosocial  wellbeing of patients including but not limited to depression, anxiety, schizophrenia, personality disorders, addiction, and other substance use disorders that may be associated with chronic pain. They tend to rely primarily on the treatment of chronic pain using prescription medications.   Physical Medicine and Rehabilitation (PMR) physicians, also known as physiatrists - trained to treat a wide variety of medical conditions affecting the brain, spinal cord, nerves, bones, joints, ligaments, muscles, and tendons. Their training is primarily aimed at treating patients that have suffered injuries that have caused severe physical impairment. Their training is primarily aimed at the physical therapy and rehabilitation of those patients. They may also work alongside orthopedic surgeons or neurosurgeons using their expertise in assisting surgical patients to recover after their surgeries.  INTERVENTIONAL PAIN MANAGEMENT is sub-subspecialty of Pain Management.  Our physicians are Board-certified in Anesthesia, Pain Management, and Interventional Pain Management.  This meaning that not only have they been trained and Board-certified in their specialty of Anesthesia, and subspecialty of Pain Management, but they have also received further training in the sub-subspecialty of Interventional Pain Management, in order to become Board-certified as INTERVENTIONAL PAIN MANAGEMENT SPECIALIST.    Mission: Our goal is to use our skills in  INTERVENTIONAL PAIN MANAGEMENT as alternatives to the chronic use of prescription opioid medications for the treatment of pain. To make this more clear, we have changed our name to reflect what we do and offer. We will continue to offer medication management assessment and recommendations, but we will not be taking over any patient's medication management.  ____________________________________________________________________________________________      ____________________________________________________________________________________________  National Pain Medication Shortage  The [place] is experiencing worsening drug shortages. These have had a negative widespread effect on patient care and treatment. Not expected to improve any time soon. Predicted to last past 2029.   Drug shortage list (generic   names) Oxycodone IR Oxycodone/APAP Oxymorphone IR Hydromorphone Hydrocodone/APAP Morphine  Where is the problem?  Manufacturing and supply level.  Will this shortage affect you?  Only if you take any of the above pain medications.  How? You may be unable to fill your prescription.  Your pharmacist may offer a "partial fill" of your prescription. (Warning: Do not accept partial fills.) Prescriptions partially filled cannot be transferred to another pharmacy. Read our Medication Rules and Regulation. Depending on how much medicine you are dependent on, you may experience withdrawals when unable to get the medication.  Recommendations: Consider ending your dependence on opioid pain medications. Ask your pain specialist to assist you with the process. Consider switching to a medication currently not in shortage, such as Buprenorphine. Talk to your pain specialist about this option. Consider decreasing your pain medication requirements by managing tolerance thru "Drug Holidays". This may help minimize withdrawals, should you run out of medicine. Control your pain thru the use of non-pharmacological interventional therapies.   Your prescriber: Prescribers cannot be blamed for shortages. Medication manufacturing and supply issues cannot be fixed by the prescriber.   NOTE: The prescriber is not responsible for supplying the medication, or solving supply issues. Work with your pharmacist to solve it. The patient is responsible for the decision to take or continue taking the medication and for identifying and securing a legal supply source. By  law, supplying the medication is the job and responsibility of the pharmacy. The prescriber is responsible for the evaluation, monitoring, and prescribing of these medications.   Prescribers will NOT: Re-issue prescriptions that have been partially filled. Re-issue prescriptions already sent to a pharmacy.  Re-send prescriptions to a different pharmacy because yours did not have your medication. Ask pharmacist to order more medicine or transfer the prescription to another pharmacy. (Read below.)  New 2023 regulation: "February 05, 2022 Revised Regulation Allows DEA-Registered Pharmacies to Transfer Electronic Prescriptions at a Patient's Request DEA Headquarters Division - Public Information Office Patients now have the ability to request their electronic prescription be transferred to another pharmacy without having to go back to their practitioner to initiate the request. This revised regulation went into effect on Monday, February 01, 2022.     At a patient's request, a DEA-registered retail pharmacy can now transfer an electronic prescription for a controlled substance (schedules II-V) to another DEA-registered retail pharmacy. Prior to this change, patients would have to go through their practitioner to cancel their prescription and have it re-issued to a different pharmacy. The process was taxing and time consuming for both patients and practitioners.    The Drug Enforcement Administration (DEA) published its intent to revise the process for transferring electronic prescriptions on April 25, 2020.  The final rule was published in the federal register on December 31, 2021 and went into effect 30 days later.  Under the final rule, a prescription can only be transferred once between pharmacies, and only if allowed under existing state or other applicable law. The prescription must remain in its electronic form; may not be altered in any way; and the transfer must be communicated directly between  two licensed pharmacists. It's important to note, any authorized refills transfer with the original prescription, which means the entire prescription will be filled at the same pharmacy".  Reference: https://www.dea.gov/stories/2023/2023-02/2022-09-01/revised-regulation-allows-dea-registered-pharmacies-transfer (DEA website announcement)  https://www.govinfo.gov/content/pkg/FR-2021-12-31/pdf/2023-15847.pdf (Federal Register  Department of Justice)   Federal Register / Vol. 88, No. 143 / Thursday, December 31, 2021 / Rules and Regulations DEPARTMENT OF JUSTICE  Drug Enforcement   Administration  21 CFR Part 1306  [Docket No. [id number]]  RIN 1117-AB64 Transfer of Electronic Prescriptions for Schedules II-V Controlled Substances Between Pharmacies for Initial Filling  ____________________________________________________________________________________________     ____________________________________________________________________________________________  Drug Holidays  What is a "Drug Holiday"? Drug Holiday: is the name given to the process of slowly tapering down and temporarily stopping the pain medication for the purpose of decreasing or eliminating tolerance to the drug.  Benefits Improved effectiveness Decreased required effective dose Improved pain control End dependence on high dose therapy Decrease cost of therapy Uncovering "opioid-induced hyperalgesia". (OIH)  What is "opioid hyperalgesia"? It is a paradoxical increase in pain caused by exposure to opioids. Stopping the opioid pain medication, contrary to the expected, it actually decreases or completely eliminates the pain. Ref.: "A comprehensive review of opioid-induced hyperalgesia". Marion Lee, et.al. Pain Physician. 2011 Mar-Apr;14(2):145-61.  What is tolerance? Tolerance: the progressive loss of effectiveness of a pain medicine due to repetitive use. A common problem of opioid pain medications.  How long should a "Drug  Holiday" last? Effectiveness depends on the patient staying off all opioid pain medicines for a minimum of 14 consecutive days. (2 weeks)  How about just taking less of the medicine? Does not work. Will not accomplish goal of eliminating the excess receptors.  How about switching to a different pain medicine? (AKA. "Opioid rotation") Does not work. Creates the illusion of effectiveness by taking advantage of inaccurate equivalent dose calculations between different opioids. -This "technique" was promoted by studies funded by pharmaceutical companies, such as [employer], creators of "OxyContin".  Can I stop the medicine "cold turkey"? Depends. You should always coordinate with your Pain Specialist to make the transition as smoothly as possible. Avoid stopping the medicine abruptly without consulting. We recommend a "slow taper".  What is a slow taper? Taper: refers to the gradual decrease in dose.   How do I stop/taper the dose? Slowly. Decrease the daily amount of pills that you take by one (1) pill every seven (7) days. This is called a "slow downward taper". Example: if you normally take four (4) pills per day, drop it to three (3) pills per day for seven (7) days, then to two (2) pills per day for seven (7) days, then to one (1) per day for seven (7) days, and then stop the medicine. The 14 day "Drug Holiday" starts on the first day without medicine.   Will I experience withdrawals? Unlikely with a slow taper.  What triggers withdrawals? Withdrawals are triggered by the sudden/abrupt stop of high dose opioids. Withdrawals can be avoided by slowly decreasing the dose over a prolonged period of time.  What are withdrawals? Symptoms associated with sudden/abrupt reduction/stopping of high-dose, long-term use of pain medication. Withdrawal are seldom seen on low dose therapy, or patients rarely taking opioid medication.  Early Withdrawal Symptoms may include: Agitation Anxiety Muscle  aches Increased tearing Insomnia Runny nose Sweating Yawning  Late symptoms may include: Abdominal cramping Diarrhea Dilated pupils Goose bumps Nausea Vomiting  (Last update: 05/16/2022) ____________________________________________________________________________________________    _______________________________________________________________________  Medication Rules  Purpose: To inform patients, and their family members, of our medication rules and regulations.  Applies to: All patients receiving prescriptions from our practice (written or electronic).  Pharmacy of record: This is the pharmacy where your electronic prescriptions will be sent. Make sure we have the correct one.  Electronic prescriptions: In compliance with the [place] Strengthen Opioid Misuse Prevention (STOP) Act of 2017 (Session Law 2017-74/H243), effective June 07, 2018, all controlled substances must be electronically prescribed. Written prescriptions, faxing, or calling prescriptions to a pharmacy will no longer be done.  Prescription refills: These will be provided only during in-person appointments. No medications will be renewed without a "face-to-face" evaluation with your provider. Applies to all prescriptions.  NOTE: The following applies primarily to controlled substances (Opioid* Pain Medications).   Type of encounter (visit): For patients receiving controlled substances, face-to-face visits are required. (Not an option and not up to the patient.)  Patient's responsibilities: Pain Pills: Bring all pain pills to every appointment (except for procedure appointments). Pill Bottles: Bring pills in original pharmacy bottle. Bring bottle, even if empty. Always bring the bottle of the most recent fill.  Medication refills: You are responsible for knowing and keeping track of what medications you are taking and when is it that you will need a refill. The day before your appointment: write a  list of all prescriptions that need to be refilled. The day of the appointment: give the list to the admitting nurse. Prescriptions will be written only during appointments. No prescriptions will be written on procedure days. If you forget a medication: it will not be "Called in", "Faxed", or "electronically sent". You will need to get another appointment to get these prescribed. No early refills. Do not call asking to have your prescription filled early. Partial  or short prescriptions: Occasionally your pharmacy may not have enough pills to fill your prescription.  NEVER ACCEPT a partial fill or a prescription that is short of the total amount of pills that you were prescribed.  With controlled substances the law allows 72 hours for the pharmacy to complete the prescription.  If the prescription is not completed within 72 hours, the pharmacist will require a new prescription to be written. This means that you will be short on your medicine and we WILL NOT send another prescription to complete your original prescription.  Instead, request the pharmacy to send a carrier to a nearby branch to get enough medication to provide you with your full prescription. Prescription Accuracy: You are responsible for carefully inspecting your prescriptions before leaving our office. Have the discharge nurse carefully go over each prescription with you, before taking them home. Make sure that your name is accurately spelled, that your address is correct. Check the name and dose of your medication to make sure it is accurate. Check the number of pills, and the written instructions to make sure they are clear and accurate. Make sure that you are given enough medication to last until your next medication refill appointment. Taking Medication: Take medication as prescribed. When it comes to controlled substances, taking less pills or less frequently than prescribed is permitted and encouraged. Never take more pills than  instructed. Never take the medication more frequently than prescribed.  Inform other Doctors: Always inform, all of your healthcare providers, of all the medications you take. Pain Medication from other Providers: You are not allowed to accept any additional pain medication from any other Doctor or Healthcare provider. There are two exceptions to this rule. (see below) In the event that you require additional pain medication, you are responsible for notifying us, as stated below. Cough Medicine: Often these contain an opioid, such as codeine or hydrocodone. Never accept or take cough medicine containing these opioids if you are already taking an opioid* medication. The combination may cause respiratory failure and death. Medication Agreement: You are responsible for carefully reading and following our Medication Agreement. This   must be signed before receiving any prescriptions from our practice. Safely store a copy of your signed Agreement. Violations to the Agreement will result in no further prescriptions. (Additional copies of our Medication Agreement are available upon request.) Laws, Rules, & Regulations: All patients are expected to follow all Federal and State Laws, Statutes, Rules, & Regulations. Ignorance of the Laws does not constitute a valid excuse.  Illegal drugs and Controlled Substances: The use of illegal substances (including, but not limited to marijuana and its derivatives) and/or the illegal use of any controlled substances is strictly prohibited. Violation of this rule may result in the immediate and permanent discontinuation of any and all prescriptions being written by our practice. The use of any illegal substances is prohibited. Adopted CDC guidelines & recommendations: Target dosing levels will be at or below 60 MME/day. Use of benzodiazepines** is not recommended.  Exceptions: There are only two exceptions to the rule of not receiving pain medications from other Healthcare  Providers. Exception #1 (Emergencies): In the event of an emergency (i.e.: accident requiring emergency care), you are allowed to receive additional pain medication. However, you are responsible for: As soon as you are able, call our office [phone], at any time of the day or night, and leave a message stating your name, the date and nature of the emergency, and the name and dose of the medication prescribed. In the event that your call is answered by a member of our staff, make sure to document and save the date, time, and the name of the person that took your information.  Exception #2 (Planned Surgery): In the event that you are scheduled by another doctor or dentist to have any type of surgery or procedure, you are allowed (for a period no longer than 30 days), to receive additional pain medication, for the acute post-op pain. However, in this case, you are responsible for picking up a copy of our "Post-op Pain Management for Surgeons" handout, and giving it to your surgeon or dentist. This document is available at our office, and does not require an appointment to obtain it. Simply go to our office during business hours (Monday-Thursday from 8:00 AM to 4:00 PM) (Friday 8:00 AM to 12:00 Noon) or if you have a scheduled appointment with us, prior to your surgery, and ask for it by name. In addition, you are responsible for: calling our office [phone], at any time of the day or night, and leaving a message stating your name, name of your surgeon, type of surgery, and date of procedure or surgery. Failure to comply with your responsibilities may result in termination of therapy involving the controlled substances. Medication Agreement Violation. Following the above rules, including your responsibilities will help you in avoiding a Medication Agreement Violation ("Breaking your Pain Medication Contract").  Consequences:  Not following the above rules may result in permanent discontinuation of  medication prescription therapy.  *Opioid medications include: morphine, codeine, oxycodone, oxymorphone, hydrocodone, hydromorphone, meperidine, tramadol, tapentadol, buprenorphine, fentanyl, methadone. **Benzodiazepine medications include: diazepam (Valium), alprazolam (Xanax), clonazepam (Klonopine), lorazepam (Ativan), clorazepate (Tranxene), chlordiazepoxide (Librium), estazolam (Prosom), oxazepam (Serax), temazepam (Restoril), triazolam (Halcion) (Last updated: 03/30/2022) ______________________________________________________________________    ______________________________________________________________________  Medication Recommendations and Reminders  Applies to: All patients receiving prescriptions (written and/or electronic).  Medication Rules & Regulations: You are responsible for reading, knowing, and following our "Medication Rules" document. These exist for your safety and that of others. They are not flexible and neither are we. Dismissing or ignoring them is an   act of "non-compliance" that may result in complete and irreversible termination of such medication therapy. For safety reasons, "non-compliance" will not be tolerated. As with the [place] fundamental legal principle of "ignorance of the law is no defense", we will accept no excuses for not having read and knowing the content of documents provided to you by our practice.  Pharmacy of record:  Definition: This is the pharmacy where your electronic prescriptions will be sent.  We do not endorse any particular pharmacy. It is up to you and your insurance to decide what pharmacy to use.  We do not restrict you in your choice of pharmacy. However, once we write for your prescriptions, we will NOT be re-sending more prescriptions to fix restricted supply problems created by your pharmacy, or your insurance.  The pharmacy listed in the electronic medical record should be the one where you want electronic prescriptions to be  sent. If you choose to change pharmacy, simply notify our nursing staff. Changes will be made only during your regular appointments and not over the phone.  Recommendations: Keep all of your pain medications in a safe place, under lock and key, even if you live alone. We will NOT replace lost, stolen, or damaged medication. We do not accept "Police Reports" as proof of medications having been stolen. After you fill your prescription, take 1 week's worth of pills and put them away in a safe place. You should keep a separate, properly labeled bottle for this purpose. The remainder should be kept in the original bottle. Use this as your primary supply, until it runs out. Once it's gone, then you know that you have 1 week's worth of medicine, and it is time to come in for a prescription refill. If you do this correctly, it is unlikely that you will ever run out of medicine. To make sure that the above recommendation works, it is very important that you make sure your medication refill appointments are scheduled at least 1 week before you run out of medicine. To do this in an effective manner, make sure that you do not leave the office without scheduling your next medication management appointment. Always ask the nursing staff to show you in your prescription , when your medication will be running out. Then arrange for the receptionist to get you a return appointment, at least 7 days before you run out of medicine. Do not wait until you have 1 or 2 pills left, to come in. This is very poor planning and does not take into consideration that we may need to cancel appointments due to bad weather, sickness, or emergencies affecting our staff. DO NOT ACCEPT A "Partial Fill": If for any reason your pharmacy does not have enough pills/tablets to completely fill or refill your prescription, do not allow for a "partial fill". The law allows the pharmacy to complete that prescription within 72 hours, without requiring a new  prescription. If they do not fill the rest of your prescription within those 72 hours, you will need a separate prescription to fill the remaining amount, which we will NOT provide. If the reason for the partial fill is your insurance, you will need to talk to the pharmacist about payment alternatives for the remaining tablets, but again, DO NOT ACCEPT A PARTIAL FILL, unless you can trust your pharmacist to obtain the remainder of the pills within 72 hours.  Prescription refills and/or changes in medication(s):  Prescription refills, and/or changes in dose or medication, will be conducted only   during scheduled medication management appointments. (Applies to both, written and electronic prescriptions.) No refills on procedure days. No medication will be changed or started on procedure days. No changes, adjustments, and/or refills will be conducted on a procedure day. Doing so will interfere with the diagnostic portion of the procedure. No phone refills. No medications will be "called into the pharmacy". No Fax refills. No weekend refills. No Holliday refills. No after hours refills.  Remember:  Business hours are:  Monday to Thursday 8:00 AM to 4:00 PM Provider's Schedule: Chilton Sallade, MD - Appointments are:  Medication management: Monday and Wednesday 8:00 AM to 4:00 PM Procedure day: Tuesday and Thursday 7:30 AM to 4:00 PM Bilal Lateef, MD - Appointments are:  Medication management: Tuesday and Thursday 8:00 AM to 4:00 PM Procedure day: Monday and Wednesday 7:30 AM to 4:00 PM (Last update: 03/30/2022) ______________________________________________________________________    ____________________________________________________________________________________________  WARNING: CBD (cannabidiol) & Delta (Delta-8 tetrahydrocannabinol) products.   Applicable to:  All individuals currently taking or considering taking CBD (cannabidiol) and, more important, all patients taking opioid  analgesic controlled substances (pain medication). (Example: oxycodone; oxymorphone; hydrocodone; hydromorphone; morphine; methadone; tramadol; tapentadol; fentanyl; buprenorphine; butorphanol; dextromethorphan; meperidine; codeine; etc.)  Introduction:  Recently there has been a drive towards the use of "natural" products for the treatment of different conditions, including pain anxiety and sleep disorders. Marijuana and hemp are two varieties of the cannabis genus plants. Marijuana and its derivatives are illegal, while hemp and its derivatives are not. Cannabidiol (CBD) and tetrahydrocannabinol (THC), are two natural compounds found in plants of the Cannabis genus. They can both be extracted from hemp or marijuana. Both compounds interact with your body's endocannabinoid system in very different ways. CBD is associated with pain relief (analgesia) while THC is associated with the psychoactive effects ("the high") obtained from the use of marijuana products. There are two main types of THC: Delta-9, which comes from the marijuana plant and it is illegal, and Delta-8, which comes from the hemp plant, and it is legal. (Both, Delta-9-THC and Delta-8-THC are psychoactive and give you "the high".)   Legality:  Marijuana and its derivatives: illegal Hemp and its derivatives: Legal (State dependent) UPDATE: (07/24/2021) The Drug Enforcement Agency (DEA) issued a letter stating that "delta" cannabinoids, including Delta-8-THCO and Delta-9-THCO, synthetically derived from hemp do not qualify as hemp and will be viewed as Schedule I drugs. (Schedule I drugs, substances, or chemicals are defined as drugs with no currently accepted medical use and a high potential for abuse. Some examples of Schedule I drugs are: heroin, lysergic acid diethylamide (LSD), marijuana (cannabis), 3,4-methylenedioxymethamphetamine (ecstasy), methaqualone, and peyote.) (https://www.dea.gov)  Legal status of CBD in [place]:  "Conditionally  Legal"  Reference: "FDA Regulation of Cannabis and Cannabis-Derived Products, Including Cannabidiol (CBD)" - https://www.fda.gov/news-events/public-health-focus/fda-regulation-cannabis-and-cannabis-derived-products-including-cannabidiol-cbd  Warning:  CBD is not FDA approved and has not undergo the same manufacturing controls as prescription drugs.  This means that the purity and safety of available CBD may be questionable. Most of the time, despite manufacturer's claims, it is contaminated with THC (delta-9-tetrahydrocannabinol - the chemical in marijuana responsible for the "HIGH").  When this is the case, the THC contaminant will trigger a positive urine drug screen (UDS) test for Marijuana (carboxy-THC).   The FDA recently put out a warning about 5 things that everyone should be aware of regarding Delta-8 THC: Delta-8 THC products have not been evaluated or approved by the FDA for safe use and may be marketed in ways that put the public health at   risk. The FDA has received adverse event reports involving delta-8 THC-containing products. Delta-8 THC has psychoactive and intoxicating effects. Delta-8 THC manufacturing often involve use of potentially harmful chemicals to create the concentrations of delta-8 THC claimed in the marketplace. The final delta-8 THC product may have potentially harmful by-products (contaminants) due to the chemicals used in the process. Manufacturing of delta-8 THC products may occur in uncontrolled or unsanitary settings, which may lead to the presence of unsafe contaminants or other potentially harmful substances. Delta-8 THC products should be kept out of the reach of children and pets.  NOTE: Because a positive UDS for any illicit substance is a violation of our medication agreement, your opioid analgesics (pain medicine) may be permanently discontinued.  MORE ABOUT CBD  General Information: CBD was discovered in 1940 and it is a derivative of the cannabis sativa  genus plants (Marijuana and Hemp). It is one of the 113 identified substances found in Marijuana. It accounts for up to 40% of the plant's extract. As of 2018, preliminary clinical studies on CBD included research for the treatment of anxiety, movement disorders, and pain. CBD is available and consumed in multiple forms, including inhalation of smoke or vapor, as an aerosol spray, and by mouth. It may be supplied as an oil containing CBD, capsules, dried cannabis, or as a liquid solution. CBD is thought not to be as psychoactive as THC (delta-9-tetrahydrocannabinol - the chemical in marijuana responsible for the "HIGH"). Studies suggest that CBD may interact with different biological target receptors in the body, including cannabinoid and other neurotransmitter receptors. As of 2018 the mechanism of action for its biological effects has not been determined.  Side-effects  Adverse reactions: Dry mouth, diarrhea, decreased appetite, fatigue, drowsiness, malaise, weakness, sleep disturbances, and others.  Drug interactions:  CBD may interact with medications such as blood-thinners. CBD causes drowsiness on its own and it will increase drowsiness caused by other medications, including antihistamines (such as Benadryl), benzodiazepines (Xanax, Ativan, Valium), antipsychotics, antidepressants, opioids, alcohol and supplements such as kava, melatonin and St. John's Wort.  Other drug interactions: Brivaracetam (Briviact); Caffeine; Carbamazepine (Tegretol); Citalopram (Celexa); Clobazam (Onfi); Eslicarbazepine (Aptiom); Everolimus (Zostress); Lithium; Methadone (Dolophine); Rufinamide (Banzel); Sedative medications (CNS depressants); Sirolimus (Rapamune); Stiripentol (Diacomit); Tacrolimus (Prograf); Tamoxifen ; Soltamox); Topiramate (Topamax); Valproate; Warfarin (Coumadin); Zonisamide. (Last update: 05/17/2022) ____________________________________________________________________________________________    ____________________________________________________________________________________________  Naloxone Nasal Spray  Why am I receiving this medication?  STOP ACT requires that all patients taking high dose opioids or at risk of opioids respiratory depression, be prescribed an opioid reversal agent, such as Naloxone (AKA: Narcan).  What is this medication? NALOXONE (nal OX one) treats opioid overdose, which causes slow or shallow breathing, severe drowsiness, or trouble staying awake. Call emergency services after using this medication. You may need additional treatment. Naloxone works by reversing the effects of opioids. It belongs to a group of medications called opioid blockers.  COMMON BRAND NAME(S): Kloxxado, Narcan  What should I tell my care team before I take this medication? They need to know if you have any of these conditions: Heart disease Substance use disorder An unusual or allergic reaction to naloxone, other medications, foods, dyes, or preservatives Pregnant or trying to get pregnant Breast-feeding  When to use this medication? This medication is to be used for the treatment of respiratory depression (less than 8 breaths per minute) secondary to opioid overdose.   How to use this medication? This medication is for use in the nose. Lay the person on their   back. Support their neck with your hand and allow the head to tilt back before giving the medication. The nasal spray should be given into 1 nostril. After giving the medication, move the person onto their side. Do not remove or test the nasal spray until ready to use. Get emergency medical help right away after giving the first dose of this medication, even if the person wakes up. You should be familiar with how to recognize the signs and symptoms of a narcotic overdose. If more doses are needed, give the additional dose in the other nostril. Talk to your care team about the use of this medication in children.  While this medication may be prescribed for children as young as newborns for selected conditions, precautions do apply.  Naloxone Overdosage: If you think you have taken too much of this medicine contact a poison control center or emergency room at once.  NOTE: This medicine is only for you. Do not share this medicine with others.  What if I miss a dose? This does not apply.  What may interact with this medication? This is only used during an emergency. No interactions are expected during emergency use. This list may not describe all possible interactions. Give your health care provider a list of all the medicines, herbs, non-prescription drugs, or dietary supplements you use. Also tell them if you smoke, drink alcohol, or use illegal drugs. Some items may interact with your medicine.  What should I watch for while using this medication? Keep this medication ready for use in the case of an opioid overdose. Make sure that you have the phone number of your care team and local hospital ready. You may need to have additional doses of this medication. Each nasal spray contains a single dose. Some emergencies may require additional doses. After use, bring the treated person to the nearest hospital or call 911. Make sure the treating care team knows that the person has received a dose of this medication. You will receive additional instructions on what to do during and after use of this medication before an emergency occurs.  What side effects may I notice from receiving this medication? Side effects that you should report to your care team as soon as possible: Allergic reactions--skin rash, itching, hives, swelling of the face, lips, tongue, or throat Side effects that usually do not require medical attention (report these to your care team if they continue or are bothersome): Constipation Dryness or irritation inside the nose Headache Increase in blood pressure Muscle spasms Stuffy  nose Toothache This list may not describe all possible side effects. Call your doctor for medical advice about side effects. You may report side effects to FDA at 1-800-FDA-1088.  Where should I keep my medication? Because this is an emergency medication, you should keep it with you at all times.  Keep out of the reach of children and pets. Store between 20 and 25 degrees C (68 and 77 degrees F). Do not freeze. Throw away any unused medication after the expiration date. Keep in original box until ready to use.  NOTE: This sheet is a summary. It may not cover all possible information. If you have questions about this medicine, talk to your doctor, pharmacist, or health care provider.   2023 Elsevier/Gold Standard (2021-01-30 00:00:00)  ____________________________________________________________________________________________   

## 2022-05-24 NOTE — Progress Notes (Signed)
Nursing Pain Medication Assessment:  Safety precautions to be maintained throughout the outpatient stay will include: orient to surroundings, keep bed in low position, maintain call bell within reach at all times, provide assistance with transfer out of bed and ambulation.  Medication Inspection Compliance: Pill count conducted under aseptic conditions, in front of the patient. Neither the pills nor the bottle was removed from the patient's sight at any time. Once count was completed pills were immediately returned to the patient in their original bottle.  Medication: Oxycodone IR Pill/Patch Count:  39 of 90 pills remain Pill/Patch Appearance: Markings consistent with prescribed medication Bottle Appearance: Standard pharmacy container. Clearly labeled. Filled Date: 76 / 30 / 2023 Last Medication intake:  Today

## 2022-07-04 ENCOUNTER — Other Ambulatory Visit: Payer: Self-pay | Admitting: Oncology

## 2022-07-06 ENCOUNTER — Telehealth: Payer: Self-pay | Admitting: Pain Medicine

## 2022-07-06 NOTE — Telephone Encounter (Signed)
Patient informed that prescription for Oxycodone is at [facility] in [place] and that PA has been submitted.

## 2022-07-06 NOTE — Telephone Encounter (Signed)
PT stated that the pharmacy need okay to switch prescription back to [place]. Please give patient a call. Thanks

## 2022-07-12 ENCOUNTER — Telehealth: Payer: Self-pay | Admitting: Pain Medicine

## 2022-07-12 NOTE — Telephone Encounter (Signed)
PT wants to see had the insurance company approve the PA for him to pick up prescription. Please give patient a call. TY

## 2022-07-12 NOTE — Telephone Encounter (Signed)
Patient notified Oxycodone has been approved.

## 2022-08-22 NOTE — Progress Notes (Unsigned)
PROVIDER NOTE: Information contained herein reflects review and annotations entered in association with encounter. Interpretation of such information and data should be left to medically-trained personnel. Information provided to patient can be located elsewhere in the medical record under "Patient Instructions". Document created using STT-dictation technology, any transcriptional errors that may result from process are unintentional.    Patient: Justin [name]  Service Category: E/M  Provider: Gaspar Cola, MD  DOB: Dec 24, 1965  DOS: 08/23/2022  Referring Provider: Jon Billings, NP  MRN: [id number]  Specialty: Interventional Pain Management  PCP: Jon Billings, NP  Type: Established Patient  Setting: Ambulatory outpatient    Location: Office  Delivery: Face-to-face     HPI  Justin [name], a 57 y.o. year old male, is here today because of his No primary diagnosis found.. Justin [name] primary complain today is No chief complaint on file.  Pertinent problems: Mr. 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 Pain Assessment: Severity of   is reported as a  /10. Location:    / . Onset:  . Quality:  . Timing:  . Modifying factor(s):  Marland Kitchen Vitals:  vitals were not taken for this visit.  BMI: Estimated body mass index is 32.55 kg/m as calculated from the following:   Height as of 05/24/22: 6' (1.829 m).   Weight as of 05/24/22: 240 lb (108.9 kg). Last encounter: 05/24/2022. Last procedure: Visit date not found.  Reason for encounter:  *** . ***  Pharmacotherapy Assessment  Analgesic:  Oxycodone IR 5 mg, 1 tab PO q 8 hrs (15 mg/day of oxycodone)  MME/day: 22.5 mg/day.   Monitoring: [place] PMP: PDMP reviewed during this encounter.       Pharmacotherapy: No side-effects or adverse reactions reported. Compliance: No problems identified. Effectiveness: Clinically acceptable.  No notes on file  No results found for: "CBDTHCR" No results found for: "D8THCCBX" No results found for: "D9THCCBX"  UDS:  Summary  Date Value Ref Range Status  11/23/2021 Note  Final    Comment:    ==================================================================== [employer] [facility] (MW) ==================================================================== Test                             Result       Flag       Units  Drug Present and Declared for Prescription Verification   Oxycodone                      1545         EXPECTED   ng/mg creat   Oxymorphone                    858          EXPECTED   ng/mg creat   Noroxycodone                   1398         EXPECTED   ng/mg creat   Noroxymorphone                 231  EXPECTED   ng/mg creat    Sources of oxycodone are scheduled prescription medications.    Oxymorphone, noroxycodone, and noroxymorphone are expected    metabolites of oxycodone. Oxymorphone is also available as a    scheduled prescription medication.  ==================================================================== Test                      Result    Flag   Units      Ref Range   Creatinine              121              mg/dL      >=20 ==================================================================== Declared Medications:  The flagging and interpretation on this report are based on the  following declared medications.  Unexpected results may arise from  inaccuracies in the declared medications.   **Note: The testing scope of this panel includes these medications:   Oxycodone   **Note: The testing scope of this panel does not include the  following reported  medications:   Acetaminophen (Tylenol)  Albuterol (Ventolin HFA)  Apixaban (Eliquis)  Atorvastatin (Lipitor)  Calcium ==================================================================== For clinical consultation, please call [phone]. ====================================================================       ROS  Constitutional: Denies any fever or chills Gastrointestinal: No reported hemesis, hematochezia, vomiting, or acute GI distress Musculoskeletal: Denies any acute onset joint swelling, redness, loss of ROM, or weakness Neurological: No reported episodes of acute onset apraxia, aphasia, dysarthria, agnosia, amnesia, paralysis, loss of coordination, or loss of consciousness  Medication Review  GNP Calcium 1200, albuterol, apixaban, atorvastatin, ibuprofen, naloxone, and oxyCODONE  History Review  Allergy: Justin [name] has No Known Allergies. Drug: Justin [name]  reports no history of drug use. Alcohol:  reports no history of alcohol use. Tobacco:  reports that he has quit smoking. His smoking use included cigarettes. He smoked an average of .5 packs per day. He has never used smokeless tobacco. Social: Justin [name]  reports that he has quit smoking. His smoking use included cigarettes. He smoked an average of .5 packs per day. He has never used smokeless tobacco. He reports that he does not drink alcohol and does not use drugs. Medical:  has a past medical history of Arm vein blood clot, Coronary artery disease, DVT (deep venous thrombosis) ([place]) (2012; January 2016), GERD (gastroesophageal reflux disease), Rib pain on right side (08/04/2016), and Vascular thoracic outlet syndrome. Surgical: Justin [name]  has a past surgical history that includes Knee cartilage surgery; Carotid angiogram; Scalenotomy w/o resection cervical rib (2012); Right upper extremity thrombectomy (2012); and Colonoscopy with propofol (N/A, 10/10/2019). Family: family history includes Breast cancer (age of  onset: 53) in his father; Cancer in his maternal grandmother; Diabetes in his father; Heart attack in his father; Heart disease in his father; Leukemia in his maternal grandfather; Prostate cancer in his maternal uncle.  Laboratory Chemistry Profile   Renal Lab Results  Component Value Date   BUN 9 04/08/2022   CREATININE 1.28 (H) 04/08/2022   BCR 7 (L) 04/08/2022   GFRAA >60 02/19/2020   GFRNONAA >60 12/14/2021    Hepatic Lab Results  Component Value Date   AST 16 04/08/2022   ALT 19 04/08/2022   ALBUMIN 4.0 04/08/2022   ALKPHOS 126 (H) 04/08/2022   LIPASE 90 03/02/2014    Electrolytes Lab Results  Component Value Date   NA 139 04/08/2022   K 4.0 04/08/2022   CL 102 04/08/2022   CALCIUM 9.4 04/08/2022  MG 2.0 07/28/2017    Bone Lab Results  Component Value Date   VD25OH 48.5 04/08/2022   25OHVITD1 15 (L) 06/29/2018   25OHVITD2 8.4 06/29/2018   25OHVITD3 6.8 06/29/2018    Inflammation (CRP: Acute Phase) (ESR: Chronic Phase) Lab Results  Component Value Date   CRP 12 (H) 06/29/2018   ESRSEDRATE 27 06/29/2018   LATICACIDVEN 0.9 07/20/2016         Note: Above Lab results reviewed.  Recent Imaging Review  DG Ankle Complete Left CLINICAL DATA:  Ankle and Achilles tendon pain, initial encounter  EXAM: LEFT ANKLE COMPLETE - 3+ VIEW  COMPARISON:  None Available.  FINDINGS: Tarsal degenerative changes are seen. No acute fracture or dislocation is noted. No soft tissue abnormality is seen.  IMPRESSION: No acute abnormality noted.  Electronically Signed   By: Inez Catalina M.D.   On: 12/04/2021 21:13 Note: Reviewed        Physical Exam  General appearance: Well nourished, well developed, and well hydrated. In no apparent acute distress Mental status: Alert, oriented x 3 (person, place, & time)       Respiratory: No evidence of acute respiratory distress Eyes: PERLA Vitals: There were no vitals taken for this visit. BMI: Estimated body mass index is  32.55 kg/m as calculated from the following:   Height as of 05/24/22: 6' (1.829 m).   Weight as of 05/24/22: 240 lb (108.9 kg). Ideal: Patient weight not recorded  Assessment   Diagnosis Status  No diagnosis found. Controlled Controlled Controlled   Updated Problems: No problems updated.  Plan of Care  Problem-specific:  No problem-specific Assessment & Plan notes found for this encounter.  Justin [name] has a current medication list which includes the following long-term medication(s): albuterol, atorvastatin, gnp calcium 1200, eliquis, oxycodone, oxycodone, and oxycodone.  Pharmacotherapy (Medications Ordered): No orders of the defined types were placed in this encounter.  Orders:  No orders of the defined types were placed in this encounter.  Follow-up plan:   No follow-ups on file.      Interventional Therapies  Risk  Complexity Considerations:   ELIQUIS Anticoagulation: (S:3d  R:6h)  LOVENOX Bridge: (S:24h   R:4h)    Planned  Pending:      Under consideration:   Diagnostic right thoracic SNRB  Diagnostic right thoracic facet block  Possible right thoracic facet medial branch RFA    Completed:   Palliative right T6, T7, T8, & T9 intercostal NB x2 (10/11/2017) (100/100/50/50)  Palliative right T6, T7, T8, & T9 intercostal nerve RFA x1 (12/29/17)    Therapeutic  Palliative (PRN) options:   Palliative right T6, T7, T8, & T9 intercostal NB #3  Palliative right T6, T7, T8, & T9 intercostal nerve RFA #2    Pharmacotherapy:  Nonopioids transferred 03/24/2020: Turmeric, Calcium Recommendations:   None at this time.       Recent Visits Date Type Provider Dept  05/24/22 Office Visit Milinda Pointer, MD [facility]  Showing recent visits within past 90 days and meeting all other requirements Future Appointments Date Type Provider Dept  08/23/22 Appointment Milinda Pointer, MD [facility]  Showing future appointments  within next 90 days and meeting all other requirements  I discussed the assessment and treatment plan with the patient. The patient was provided an opportunity to ask questions and all were answered. The patient agreed with the plan and demonstrated an understanding of the instructions.  Patient advised to call back or seek an  in-person evaluation if the symptoms or condition worsens.  Duration of encounter: *** minutes.  Total time on encounter, as per AMA guidelines included both the face-to-face and non-face-to-face time personally spent by the physician and/or other qualified health care professional(s) on the day of the encounter (includes time in activities that require the physician or other qualified health care professional and does not include time in activities normally performed by clinical staff). Physician's time may include the following activities when performed: Preparing to see the patient (e.g., pre-charting review of records, searching for previously ordered imaging, lab work, and nerve conduction tests) Review of prior analgesic pharmacotherapies. Reviewing PMP Interpreting ordered tests (e.g., lab work, imaging, nerve conduction tests) Performing post-procedure evaluations, including interpretation of diagnostic procedures Obtaining and/or reviewing separately obtained history Performing a medically appropriate examination and/or evaluation Counseling and educating the patient/family/caregiver Ordering medications, tests, or procedures Referring and communicating with other health care professionals (when not separately reported) Documenting clinical information in the electronic or other health record Independently interpreting results (not separately reported) and communicating results to the patient/ family/caregiver Care coordination (not separately reported)  Note by: Gaspar Cola, MD Date: 08/23/2022; Time: 9:39 AM

## 2022-08-22 NOTE — Patient Instructions (Incomplete)
____________________________________________________________________________________________  Opioid Pain Medication Update  To: All patients taking opioid pain medications. (I.e.: hydrocodone, hydromorphone, oxycodone, oxymorphone, morphine, codeine, methadone, tapentadol, tramadol, buprenorphine, fentanyl, etc.)  Re: Updated review of side effects and adverse reactions of opioid analgesics, as well as new information about long term effects of this class of medications.  Direct risks of long-term opioid therapy are not limited to opioid addiction and overdose. Potential medical risks include serious fractures, breathing problems during sleep, hyperalgesia, immunosuppression, chronic constipation, bowel obstruction, myocardial infarction, and tooth decay secondary to xerostomia.  Unpredictable adverse effects that can occur even if you take your medication correctly: Cognitive impairment, respiratory depression, and death. Most people think that if they take their medication "correctly", and "as instructed", that they will be safe. Nothing could be farther from the truth. In reality, a significant amount of recorded deaths associated with the use of opioids has occurred in individuals that had taken the medication for a long time, and were taking their medication correctly. The following are examples of how this can happen: Patient taking his/her medication for a long time, as instructed, without any side effects, is given a certain antibiotic or another unrelated medication, which in turn triggers a "Drug-to-drug interaction" leading to disorientation, cognitive impairment, impaired reflexes, respiratory depression or an untoward event leading to serious bodily harm or injury, including death.  Patient taking his/her medication for a long time, as instructed, without any side effects, develops an acute impairment of liver and/or kidney function. This will lead to a rapid inability of the body to  breakdown and eliminate their pain medication, which will result in effects similar to an "overdose", but with the same medicine and dose that they had always taken. This again may lead to disorientation, cognitive impairment, impaired reflexes, respiratory depression or an untoward event leading to serious bodily harm or injury, including death.  A similar problem will occur with patients as they grow older and their liver and kidney function begins to decrease as part of the aging process.  Background information: Historically, the original case for using long-term opioid therapy to treat chronic noncancer pain was based on safety assumptions that subsequent experience has called into question. In 1996, the American Pain Society and the [place] [employer] issued a consensus statement supporting long-term opioid therapy. This statement acknowledged the dangers of opioid prescribing but concluded that the risk for addiction was low; respiratory depression induced by opioids was short-lived, occurred mainly in opioid-naive patients, and was antagonized by pain; tolerance was not a common problem; and efforts to control diversion should not constrain opioid prescribing. This has now proven to be wrong. Experience regarding the risks for opioid addiction, misuse, and overdose in community practice has failed to support these assumptions.  According to the Centers for Disease Control and Prevention, fatal overdoses involving opioid analgesics have increased sharply over the past decade. Currently, more than 96,700 people die from drug overdoses every year. Opioids are a factor in 7 out of every 10 overdose deaths. Deaths from drug overdose have surpassed motor vehicle accidents as the leading cause of death for individuals between the ages of 3 and 36.  Clinical data suggest that neuroendocrine dysfunction may be very common in both men and women, potentially causing hypogonadism, erectile  dysfunction, infertility, decreased libido, osteoporosis, and depression. Recent studies linked higher opioid dose to increased opioid-related mortality. Controlled observational studies reported that long-term opioid therapy may be associated with increased risk for cardiovascular events. Subsequent meta-analysis concluded  that the safety of long-term opioid therapy in elderly patients has not been proven.   Side Effects and adverse reactions: Common side effects: Drowsiness (sedation). Dizziness. Nausea and vomiting. Constipation. Physical dependence -- Dependence often manifests with withdrawal symptoms when opioids are discontinued or decreased. Tolerance -- As you take repeated doses of opioids, you require increased medication to experience the same effect of pain relief. Respiratory depression -- This can occur in healthy people, especially with higher doses. However, people with COPD, asthma or other lung conditions may be even more susceptible to fatal respiratory impairment.  Uncommon side effects: An increased sensitivity to feeling pain and extreme response to pain (hyperalgesia). Chronic use of opioids can lead to this. Delayed gastric emptying (the process by which the contents of your stomach are moved into your small intestine). Muscle rigidity. Immune system and hormonal dysfunction. Quick, involuntary muscle jerks (myoclonus). Arrhythmia. Itchy skin (pruritus). Dry mouth (xerostomia).  Long-term side effects: Chronic constipation. Sleep-disordered breathing (SDB). Increased risk of bone fractures. Hypothalamic-pituitary-adrenal dysregulation. Increased risk of overdose.  RISKS: Fractures and Falls:  Opioids increase the risk and incidence of falls. This is of particular importance in elderly patients.  Endocrine System:  Long-term administration is associated with endocrine abnormalities (endocrinopathies). (Also known as Opioid-induced Endocrinopathy) Influences  on both the hypothalamic-pituitary-adrenal axis?and the hypothalamic-pituitary-gonadal axis have been demonstrated with consequent hypogonadism and adrenal insufficiency in both sexes. Hypogonadism and decreased levels of dehydroepiandrosterone sulfate have been reported in men and women. Endocrine effects include: Amenorrhoea in women (abnormal absence of menstruation) Reduced libido in both sexes Decreased sexual function Erectile dysfunction in men Hypogonadisms (decreased testicular function with shrinkage of testicles) Infertility Depression and fatigue Loss of muscle mass Anxiety Depression Immune suppression Hyperalgesia Weight gain Anemia Osteoporosis Patients (particularly women of childbearing age) should avoid opioids. There is insufficient evidence to recommend routine monitoring of asymptomatic patients taking opioids in the long-term for hormonal deficiencies.  Immune System: Human studies have demonstrated that opioids have an immunomodulating effect. These effects are mediated via opioid receptors both on immune effector cells and in the central nervous system. Opioids have been demonstrated to have adverse effects on antimicrobial response and anti-tumour surveillance. Buprenorphine has been demonstrated to have no impact on immune function.  Opioid Induced Hyperalgesia: Human studies have demonstrated that prolonged use of opioids can lead to a state of abnormal pain sensitivity, sometimes called opioid induced hyperalgesia (OIH). Opioid induced hyperalgesia is not usually seen in the absence of tolerance to opioid analgesia. Clinically, hyperalgesia may be diagnosed if the patient on long-term opioid therapy presents with increased pain. This might be qualitatively and anatomically distinct from pain related to disease progression or to breakthrough pain resulting from development of opioid tolerance. Pain associated with hyperalgesia tends to be more diffuse than the  pre-existing pain and less defined in quality. Management of opioid induced hyperalgesia requires opioid dose reduction.  Cancer: Chronic opioid therapy has been associated with an increased risk of cancer among noncancer patients with chronic pain. This association was more evident in chronic strong opioid users. Chronic opioid consumption causes significant pathological changes in the small intestine and colon. Epidemiological studies have found that there is a link between opium dependence and initiation of gastrointestinal cancers. Cancer is the second leading cause of death after cardiovascular disease. Chronic use of opioids can cause multiple conditions such as GERD, immunosuppression and renal damage as well as carcinogenic effects, which are associated with the incidence of cancers.   Mortality: Long-term opioid use   has been associated with increased mortality among patients with chronic non-cancer pain (CNCP).  Prescription of long-acting opioids for chronic noncancer pain was associated with a significantly increased risk of all-cause mortality, including deaths from causes other than overdose.  Reference: Von Korff M, Kolodny A, Deyo RA, Chou R. Long-term opioid therapy reconsidered. Ann Intern Med. 2011 Sep 6;155(5):325-8. doi: 10.7326/[id number]. PMID: [id number]; PMCIDXX:1631110. Morley Kos, Hayward RA, Dunn KM, [place] KP. Risk of adverse events in patients prescribed long-term opioids: A cohort study in the [place] Clinical Practice Research [employer]. Eur J Pain. 2019 May;23(5):908-922. doi: 10.1002/ejp.1357. Epub 2019 Jan 31. PMID: [id number]. Colameco S, Coren JS, Ciervo CA. Continuous opioid treatment for chronic noncancer pain: a time for moderation in prescribing. Postgrad Med. 2009 Jul;121(4):61-6. doi: 10.3810/pgm.2009.07.2032. PMID: [id number]. Heywood Bene RN, [place] SD, Blazina I, Rosalio Loud, Bougatsos C, Deyo RA. The  effectiveness and risks of long-term opioid therapy for chronic pain: a systematic review for a [employer] of Health Pathways to [employer]. Ann Intern Med. 2015 Feb 17;162(4):276-86. doi: [id number]. PMID: [id number]. Marjory Sneddon [facility], [place] DM. NCHS Data Brief No. 22. [place]: Centers for Disease Control and Prevention; 2009. Sep, Increase in Fatal Poisonings Involving Opioid Analgesics in the [place], 1999-2006. Song IA, Choi HR, Oh TK. Long-term opioid use and mortality in patients with chronic non-cancer pain: Ten-year follow-up study in [place] from 2010 through 2019. EClinicalMedicine. 2022 Jul 18;51:101558. doi: 10.1016/j.eclinm.2022.UB:5887891. PMID: [id number]; PMCIDOX:8550940. Huser, W., Schubert, T., Vogelmann, T. et al. All-cause mortality in patients with long-term opioid therapy compared with non-opioid analgesics for chronic non-cancer pain: a database study. [place] Med 18, 162 (2020). [email or web address] Rashidian H, Roxy Cedar, Malekzadeh R, Haghdoost AA. An Ecological Study of the Association between Opiate Use and Incidence of Cancers. Addict Health. 2016 Fall;8(4):252-260. PMID: [id number]; PMCIDQI:9185013.  Our Goal: Our goal is to control your pain with means other than the use of opioid pain medications.  Our Recommendation: Talk to your physician about coming off of these medications. We can assist you with the tapering down and stopping these medicines. Based on the new information, even if you cannot completely stop the medication, a decrease in the dose may be associated with a lesser risk. Ask for other means of controlling the pain. Decrease or eliminate those factors that significantly contribute to your pain such as smoking, obesity, and a diet heavily tilted towards "inflammatory" nutrients.  Last Updated: 08/04/2022    ____________________________________________________________________________________________     ____________________________________________________________________________________________  Patient Information update  To: All of our patients.  Re: Name change.  It has been made official that our current name, "[facility]"   will soon be changed to "[facility]".   The purpose of this change is to eliminate any confusion created by the concept of our practice being a "Medication Management Pain Clinic". In the past this has led to the misconception that we treat pain primarily by the use of prescription medications.  Nothing can be farther from the truth.   Understanding PAIN MANAGEMENT: To further understand what our practice does, you first have to understand that "Pain Management" is a subspecialty that requires additional training once a physician has completed their specialty training, which can be in either Anesthesia, Neurology, Psychiatry, or Physical Medicine and Rehabilitation (PMR). Each one of these contributes to the final approach taken by each physician to  the management of their patient's pain. To be a "Pain Management Specialist" you must have first completed one of the specialty trainings below.  Anesthesiologists - trained in clinical pharmacology and interventional techniques such as nerve blockade and regional as well as central neuroanatomy. They are trained to block pain before, during, and after surgical interventions.  Neurologists - trained in the diagnosis and pharmacological treatment of complex neurological conditions, such as Multiple Sclerosis, Parkinson's, spinal cord injuries, and other systemic conditions that may be associated with symptoms that may include but are not limited to pain. They tend to rely primarily on the treatment of chronic pain  using prescription medications.  Psychiatrist - trained in conditions affecting the psychosocial wellbeing of patients including but not limited to depression, anxiety, schizophrenia, personality disorders, addiction, and other substance use disorders that may be associated with chronic pain. They tend to rely primarily on the treatment of chronic pain using prescription medications.   Physical Medicine and Rehabilitation (PMR) physicians, also known as physiatrists - trained to treat a wide variety of medical conditions affecting the brain, spinal cord, nerves, bones, joints, ligaments, muscles, and tendons. Their training is primarily aimed at treating patients that have suffered injuries that have caused severe physical impairment. Their training is primarily aimed at the physical therapy and rehabilitation of those patients. They may also work alongside orthopedic surgeons or neurosurgeons using their expertise in assisting surgical patients to recover after their surgeries.  INTERVENTIONAL PAIN MANAGEMENT is sub-subspecialty of Pain Management.  Our physicians are Board-certified in Anesthesia, Pain Management, and Interventional Pain Management.  This meaning that not only have they been trained and Board-certified in their specialty of Anesthesia, and subspecialty of Pain Management, but they have also received further training in the sub-subspecialty of Interventional Pain Management, in order to become Board-certified as INTERVENTIONAL PAIN MANAGEMENT SPECIALIST.    Mission: Our goal is to use our skills in  [place] as alternatives to the chronic use of prescription opioid medications for the treatment of pain. To make this more clear, we have changed our name to reflect what we do and offer. We will continue to offer medication management assessment and recommendations, but we will not be taking over any patient's medication  management.  ____________________________________________________________________________________________     ____________________________________________________________________________________________  National Pain Medication Shortage  The [place] is experiencing worsening drug shortages. These have had a negative widespread effect on patient care and treatment. Not expected to improve any time soon. Predicted to last past 2029.   Drug shortage list (generic names) Oxycodone IR Oxycodone/APAP Oxymorphone IR Hydromorphone Hydrocodone/APAP Morphine  Where is the problem?  Manufacturing and supply level.  Will this shortage affect you?  Only if you take any of the above pain medications.  How? You may be unable to fill your prescription.  Your pharmacist may offer a "partial fill" of your prescription. (Warning: Do not accept partial fills.) Prescriptions partially filled cannot be transferred to another pharmacy. Read our Medication Rules and Regulation. Depending on how much medicine you are dependent on, you may experience withdrawals when unable to get the medication.  Recommendations: Consider ending your dependence on opioid pain medications. Ask your pain specialist to assist you with the process. Consider switching to a medication currently not in shortage, such as Buprenorphine. Talk to your pain specialist about this option. Consider decreasing your pain medication requirements by managing tolerance thru "Drug Holidays". This may help minimize withdrawals, should you run out of medicine. Control your pain thru  the use of non-pharmacological interventional therapies.   Your prescriber: Prescribers cannot be blamed for shortages. Medication manufacturing and supply issues cannot be fixed by the prescriber.   NOTE: The prescriber is not responsible for supplying the medication, or solving supply issues. Work with your pharmacist to solve it. The patient is responsible for  the decision to take or continue taking the medication and for identifying and securing a legal supply source. By law, supplying the medication is the job and responsibility of the pharmacy. The prescriber is responsible for the evaluation, monitoring, and prescribing of these medications.   Prescribers will NOT: Re-issue prescriptions that have been partially filled. Re-issue prescriptions already sent to a pharmacy.  Re-send prescriptions to a different pharmacy because yours did not have your medication. Ask pharmacist to order more medicine or transfer the prescription to another pharmacy. (Read below.)  New 2023 regulation: "February 05, 2022 Revised Regulation Allows DEA-Registered Pharmacies to Transfer Electronic Prescriptions at a Patient's Request [place] Patients now have the ability to request their electronic prescription be transferred to another pharmacy without having to go back to their practitioner to initiate the request. This revised regulation went into effect on Monday, February 01, 2022.     At a patient's request, a DEA-registered retail pharmacy can now transfer an electronic prescription for a controlled substance (schedules II-V) to another DEA-registered retail pharmacy. Prior to this change, patients would have to go through their practitioner to cancel their prescription and have it re-issued to a different pharmacy. The process was taxing and time consuming for both patients and practitioners.    The Drug Enforcement Administration [facility]) published its intent to revise the process for transferring electronic prescriptions on April 25, 2020.  The final rule was published in the federal register on December 31, 2021 and went into effect 30 days later.  Under the final rule, a prescription can only be transferred once between pharmacies, and only if allowed under existing state or other applicable law. The prescription must  remain in its electronic form; may not be altered in any way; and the transfer must be communicated directly between two licensed pharmacists. It's important to note, any authorized refills transfer with the original prescription, which means the entire prescription will be filled at the same pharmacy".  Reference: [employer] [facility] website announcement)  [email or web address].pdf ([place])   [employer] / Vol. 88, No. 143 / Thursday, December 31, 2021 / Rules and Regulations DEPARTMENT OF JUSTICE  Drug Enforcement Administration  21 CFR Part 1306  [Docket No. [id number]]  RIN [id number] Transfer of Electronic Prescriptions for Schedules II-V Controlled Substances Between Pharmacies for Initial Filling  ____________________________________________________________________________________________     ____________________________________________________________________________________________  Transfer of Pain Medication between Pharmacies  Re: 2023 DEA Clarification on existing regulation  Published on DEA Website: February 05, 2022  Title: Revised Regulation Allows DEA-Registered Pharmacies to Conservator, museum/gallery Prescriptions at a Patient's Request [place]  "Patients now have the ability to request their electronic prescription be transferred to another pharmacy without having to go back to their practitioner to initiate the request. This revised regulation went into effect on Monday, February 01, 2022.     At a patient's request, a DEA-registered retail pharmacy can now transfer an electronic prescription for a controlled substance (schedules II-V) to another DEA-registered retail pharmacy. Prior to this change, patients would have to go through their practitioner to  cancel their prescription  and have it re-issued to a different pharmacy. The process was taxing and time consuming for both patients and practitioners.    The Drug Enforcement Administration (DEA) published its intent to revise the process for transferring electronic prescriptions on April 25, 2020.  The final rule was published in the federal register on December 31, 2021 and went into effect 30 days later.  Under the final rule, a prescription can only be transferred once between pharmacies, and only if allowed under existing state or other applicable law. The prescription must remain in its electronic form; may not be altered in any way; and the transfer must be communicated directly between two licensed pharmacists. It's important to note, any authorized refills transfer with the original prescription, which means the entire prescription will be filled at the same pharmacy."    REFERENCES: 1. DEA website announcement https://www.dea.gov/stories/2023/2023-02/2022-09-01/revised-regulation-allows-dea-registered-pharmacies-transfer  2. Department of Justice website  https://www.govinfo.gov/content/pkg/FR-2021-12-31/pdf/2023-15847.pdf  3. DEPARTMENT OF JUSTICE Drug Enforcement Administration 21 CFR Part 1306 [Docket No. [id number]] RIN [id number] "Transfer of Electronic Prescriptions for Schedules II-V Controlled Substances Between Pharmacies for Initial Filling"  ____________________________________________________________________________________________     _______________________________________________________________________  Medication Rules  Purpose: To inform patients, and their family members, of our medication rules and regulations.  Applies to: All patients receiving prescriptions from our practice (written or electronic).  Pharmacy of record: This is the pharmacy where your electronic prescriptions will be sent. Make sure we have the correct one.  Electronic prescriptions: In  compliance with the [place] Strengthen Opioid Misuse Prevention (STOP) Act of 2017 (Session Law 2017-74/H243), effective June 07, 2018, all controlled substances must be electronically prescribed. Written prescriptions, faxing, or calling prescriptions to a pharmacy will no longer be done.  Prescription refills: These will be provided only during in-person appointments. No medications will be renewed without a "face-to-face" evaluation with your provider. Applies to all prescriptions.  NOTE: The following applies primarily to controlled substances (Opioid* Pain Medications).   Type of encounter (visit): For patients receiving controlled substances, face-to-face visits are required. (Not an option and not up to the patient.)  Patient's responsibilities: Pain Pills: Bring all pain pills to every appointment (except for procedure appointments). Pill Bottles: Bring pills in original pharmacy bottle. Bring bottle, even if empty. Always bring the bottle of the most recent fill.  Medication refills: You are responsible for knowing and keeping track of what medications you are taking and when is it that you will need a refill. The day before your appointment: write a list of all prescriptions that need to be refilled. The day of the appointment: give the list to the admitting nurse. Prescriptions will be written only during appointments. No prescriptions will be written on procedure days. If you forget a medication: it will not be "Called in", "Faxed", or "electronically sent". You will need to get another appointment to get these prescribed. No early refills. Do not call asking to have your prescription filled early. Partial  or short prescriptions: Occasionally your pharmacy may not have enough pills to fill your prescription.  NEVER ACCEPT a partial fill or a prescription that is short of the total amount of pills that you were prescribed.  With controlled substances the law allows 72 hours for  the pharmacy to complete the prescription.  If the prescription is not completed within 72 hours, the pharmacist will require a new prescription to be written. This means that you will be short on your medicine and we WILL NOT send another prescription to complete your original   prescription.  Instead, request the pharmacy to send a carrier to a nearby branch to get enough medication to provide you with your full prescription. Prescription Accuracy: You are responsible for carefully inspecting your prescriptions before leaving our office. Have the discharge nurse carefully go over each prescription with you, before taking them home. Make sure that your name is accurately spelled, that your address is correct. Check the name and dose of your medication to make sure it is accurate. Check the number of pills, and the written instructions to make sure they are clear and accurate. Make sure that you are given enough medication to last until your next medication refill appointment. Taking Medication: Take medication as prescribed. When it comes to controlled substances, taking less pills or less frequently than prescribed is permitted and encouraged. Never take more pills than instructed. Never take the medication more frequently than prescribed.  Inform other Doctors: Always inform, all of your healthcare providers, of all the medications you take. Pain Medication from other Providers: You are not allowed to accept any additional pain medication from any other Doctor or Healthcare provider. There are two exceptions to this rule. (see below) In the event that you require additional pain medication, you are responsible for notifying us, as stated below. Cough Medicine: Often these contain an opioid, such as codeine or hydrocodone. Never accept or take cough medicine containing these opioids if you are already taking an opioid* medication. The combination may cause respiratory failure and death. Medication Agreement:  You are responsible for carefully reading and following our Medication Agreement. This must be signed before receiving any prescriptions from our practice. Safely store a copy of your signed Agreement. Violations to the Agreement will result in no further prescriptions. (Additional copies of our Medication Agreement are available upon request.) Laws, Rules, & Regulations: All patients are expected to follow all Federal and [employer], [employer], Rules, [employer] [facility]. Ignorance of the Laws does not constitute a valid excuse.  Illegal drugs and Controlled Substances: The use of illegal substances (including, but not limited to marijuana and its derivatives) and/or the illegal use of any controlled substances is strictly prohibited. Violation of this rule may result in the immediate and permanent discontinuation of any and all prescriptions being written by our practice. The use of any illegal substances is prohibited. Adopted CDC guidelines & recommendations: Target dosing levels will be at or below 60 MME/day. Use of benzodiazepines** is not recommended.  Exceptions: There are only two exceptions to the rule of not receiving pain medications from other Healthcare Providers. Exception #1 (Emergencies): In the event of an emergency (i.e.: accident requiring emergency care), you are allowed to receive additional pain medication. However, you are responsible for: As soon as you are able, call our office [phone], at any time of the day or night, and leave a message stating your name, the date and nature of the emergency, and the name and dose of the medication prescribed. In the event that your call is answered by a member of our staff, make sure to document and save the date, time, and the name of the person that took your information.  Exception #2 (Planned Surgery): In the event that you are scheduled by another doctor or dentist to have any type of surgery or procedure, you are allowed (for a period no  longer than 30 days), to receive additional pain medication, for the acute post-op pain. However, in this case, you are responsible for picking up a copy of  our "Post-op Pain Management for Surgeons" handout, and giving it to your surgeon or dentist. This document is available at our office, and does not require an appointment to obtain it. Simply go to our office during business hours (Monday-Thursday from 8:00 AM to 4:00 PM) (Friday 8:00 AM to 12:00 Noon) or if you have a scheduled appointment with [place], prior to your surgery, and ask for it by name. In addition, you are responsible for: calling our office [phone], at any time of the day or night, and leaving a message stating your name, name of your surgeon, type of surgery, and date of procedure or surgery. Failure to comply with your responsibilities may result in termination of therapy involving the controlled substances. Medication Agreement Violation. Following the above rules, including your responsibilities will help you in avoiding a Medication Agreement Violation ("Breaking your Pain Medication Contract").  Consequences:  Not following the above rules may result in permanent discontinuation of medication prescription therapy.  *Opioid medications include: morphine, codeine, oxycodone, oxymorphone, hydrocodone, hydromorphone, meperidine, tramadol, tapentadol, buprenorphine, fentanyl, methadone. **Benzodiazepine medications include: diazepam (Valium), alprazolam (Xanax), clonazepam (Klonopine), lorazepam (Ativan), clorazepate (Tranxene), chlordiazepoxide (Librium), estazolam (Prosom), oxazepam (Serax), temazepam (Restoril), triazolam (Halcion) (Last updated: 03/30/2022) ______________________________________________________________________    ______________________________________________________________________  Medication Recommendations and Reminders  Applies to: All patients receiving prescriptions (written and/or  electronic).  Medication Rules & Regulations: You are responsible for reading, knowing, and following our "Medication Rules" document. These exist for your safety and that of others. They are not flexible and neither are we. Dismissing or ignoring them is an act of "non-compliance" that may result in complete and irreversible termination of such medication therapy. For safety reasons, "non-compliance" will not be tolerated. As with the [place] fundamental legal principle of "ignorance of the law is no defense", we will accept no excuses for not having read and knowing the content of documents provided to you by our practice.  Pharmacy of record:  Definition: This is the pharmacy where your electronic prescriptions will be sent.  We do not endorse any particular pharmacy. It is up to you and your insurance to decide what pharmacy to use.  We do not restrict you in your choice of pharmacy. However, once we write for your prescriptions, we will NOT be re-sending more prescriptions to fix restricted supply problems created by your pharmacy, or your insurance.  The pharmacy listed in the electronic medical record should be the one where you want electronic prescriptions to be sent. If you choose to change pharmacy, simply notify our nursing staff. Changes will be made only during your regular appointments and not over the phone.  Recommendations: Keep all of your pain medications in a safe place, under lock and key, even if you live alone. We will NOT replace lost, stolen, or damaged medication. We do not accept "Police Reports" as proof of medications having been stolen. After you fill your prescription, take 1 week's worth of pills and put them away in a safe place. You should keep a separate, properly labeled bottle for this purpose. The remainder should be kept in the original bottle. Use this as your primary supply, until it runs out. Once it's gone, then you know that you have 1 week's worth of medicine,  and it is time to come in for a prescription refill. If you do this correctly, it is unlikely that you will ever run out of medicine. To make sure that the above recommendation works, it is very important that you make  sure your medication refill appointments are scheduled at least 1 week before you run out of medicine. To do this in an effective manner, make sure that you do not leave the office without scheduling your next medication management appointment. Always ask the nursing staff to show you in your prescription , when your medication will be running out. Then arrange for the receptionist to get you a return appointment, at least 7 days before you run out of medicine. Do not wait until you have 1 or 2 pills left, to come in. This is very poor planning and does not take into consideration that we may need to cancel appointments due to bad weather, sickness, or emergencies affecting our staff. DO NOT ACCEPT A "Partial Fill": If for any reason your pharmacy does not have enough pills/tablets to completely fill or refill your prescription, do not allow for a "partial fill". The law allows the pharmacy to complete that prescription within 72 hours, without requiring a new prescription. If they do not fill the rest of your prescription within those 72 hours, you will need a separate prescription to fill the remaining amount, which we will NOT provide. If the reason for the partial fill is your insurance, you will need to talk to the pharmacist about payment alternatives for the remaining tablets, but again, DO NOT ACCEPT A PARTIAL FILL, unless you can trust your pharmacist to obtain the remainder of the pills within 72 hours.  Prescription refills and/or changes in medication(s):  Prescription refills, and/or changes in dose or medication, will be conducted only during scheduled medication management appointments. (Applies to both, written and electronic prescriptions.) No refills on procedure days. No  medication will be changed or started on procedure days. No changes, adjustments, and/or refills will be conducted on a procedure day. Doing so will interfere with the diagnostic portion of the procedure. No phone refills. No medications will be "called into the pharmacy". No Fax refills. No weekend refills. No Holliday refills. No after hours refills.  Remember:  Business hours are:  Monday to Thursday 8:00 AM to 4:00 PM Provider's Schedule: Milinda Pointer, MD - Appointments are:  Medication management: Monday and Wednesday 8:00 AM to 4:00 PM Procedure day: Tuesday and Thursday 7:30 AM to 4:00 PM Gillis Santa, MD - Appointments are:  Medication management: Tuesday and Thursday 8:00 AM to 4:00 PM Procedure day: Monday and Wednesday 7:30 AM to 4:00 PM (Last update: 03/30/2022) ______________________________________________________________________    ____________________________________________________________________________________________  Drug Holidays  What is a "Drug Holiday"? Drug Holiday: is the name given to the process of slowly tapering down and temporarily stopping the pain medication for the purpose of decreasing or eliminating tolerance to the drug.  Benefits Improved effectiveness Decreased required effective dose Improved pain control End dependence on high dose therapy Decrease cost of therapy Uncovering "opioid-induced hyperalgesia". (OIH)  What is "opioid hyperalgesia"? It is a paradoxical increase in pain caused by exposure to opioids. Stopping the opioid pain medication, contrary to the expected, it actually decreases or completely eliminates the pain. Ref.: "A comprehensive review of opioid-induced hyperalgesia". Brion Aliment, et.al. Pain Physician. 2011 Mar-Apr;14(2):145-61.  What is tolerance? Tolerance: the progressive loss of effectiveness of a pain medicine due to repetitive use. A common problem of opioid pain medications.  How long should a "Drug  Holiday" last? Effectiveness depends on the patient staying off all opioid pain medicines for a minimum of 14 consecutive days. (2 weeks)  How about just taking less of the medicine? Does not  work. Will not accomplish goal of eliminating the excess receptors.  How about switching to a different pain medicine? (AKA. "Opioid rotation") Does not work. Creates the illusion of effectiveness by taking advantage of inaccurate equivalent dose calculations between different opioids. -This "technique" was promoted by studies funded by pharmaceutical companies, such as [employer], creators of "OxyContin".  Can I stop the medicine "cold turkey"? Depends. You should always coordinate with your Pain Specialist to make the transition as smoothly as possible. Avoid stopping the medicine abruptly without consulting. We recommend a "slow taper".  What is a slow taper? Taper: refers to the gradual decrease in dose.   How do I stop/taper the dose? Slowly. Decrease the daily amount of pills that you take by one (1) pill every seven (7) days. This is called a "slow downward taper". Example: if you normally take four (4) pills per day, drop it to three (3) pills per day for seven (7) days, then to two (2) pills per day for seven (7) days, then to one (1) per day for seven (7) days, and then stop the medicine. The 14 day "Drug Holiday" starts on the first day without medicine.   Will I experience withdrawals? Unlikely with a slow taper.  What triggers withdrawals? Withdrawals are triggered by the sudden/abrupt stop of high dose opioids. Withdrawals can be avoided by slowly decreasing the dose over a prolonged period of time.  What are withdrawals? Symptoms associated with sudden/abrupt reduction/stopping of high-dose, long-term use of pain medication. Withdrawal are seldom seen on low dose therapy, or patients rarely taking opioid medication.  Early Withdrawal Symptoms may include: Agitation Anxiety Muscle  aches Increased tearing Insomnia Runny nose Sweating Yawning  Late symptoms may include: Abdominal cramping Diarrhea Dilated pupils Goose bumps Nausea Vomiting  (Last update: 05/16/2022) ____________________________________________________________________________________________    ____________________________________________________________________________________________  WARNING: CBD (cannabidiol) & Delta (Delta-8 tetrahydrocannabinol) products.   Applicable to:  All individuals currently taking or considering taking CBD (cannabidiol) and, more important, all patients taking opioid analgesic controlled substances (pain medication). (Example: oxycodone; oxymorphone; hydrocodone; hydromorphone; morphine; methadone; tramadol; tapentadol; fentanyl; buprenorphine; butorphanol; dextromethorphan; meperidine; codeine; etc.)  Introduction:  Recently there has been a drive towards the use of "natural" products for the treatment of different conditions, including pain anxiety and sleep disorders. Marijuana and hemp are two varieties of the cannabis genus plants. Marijuana and its derivatives are illegal, while hemp and its derivatives are not. Cannabidiol (CBD) and tetrahydrocannabinol (THC), are two natural compounds found in plants of the Cannabis genus. They can both be extracted from hemp or marijuana. Both compounds interact with your body's endocannabinoid system in very different ways. CBD is associated with pain relief (analgesia) while THC is associated with the psychoactive effects ("the high") obtained from the use of marijuana products. There are two main types of THC: Delta-9, which comes from the marijuana plant and it is illegal, and Delta-8, which comes from the hemp plant, and it is legal. (Both, Delta-9-THC and Delta-8-THC are psychoactive and give you "the high".)   Legality:  Marijuana and its derivatives: illegal Hemp and its derivatives: Legal (State dependent) UPDATE:  (07/24/2021) The Drug Enforcement Agency (DEA) issued a letter stating that "delta" cannabinoids, including Delta-8-THCO and Delta-9-THCO, synthetically derived from hemp do not qualify as hemp and will be viewed as Schedule I drugs. (Schedule I drugs, substances, or chemicals are defined as drugs with no currently accepted medical use and a high potential for abuse. Some examples of Schedule I drugs are: heroin,   lysergic acid diethylamide (LSD), marijuana (cannabis), 3,4-methylenedioxymethamphetamine (ecstasy), methaqualone, and peyote.) (https://jennings.com/)  Legal status of CBD in [place]:  "Conditionally Legal"  Reference: "FDA Regulation of Cannabis and Cannabis-Derived Products, Including Cannabidiol (CBD)" - Se[employer]Art[employer].com.pt  Warning:  CBD is not FDA approved and has not undergo the same manufacturing controls as prescription drugs.  This means that the purity and safety of available CBD may be questionable. Most of the time, despite manufacturer's claims, it is contaminated with THC (delta-9-tetrahydrocannabinol - the chemical in marijuana responsible for the "HIGH").  When this is the case, the [facility] contaminant will trigger a positive urine drug screen (UDS) test for Marijuana (carboxy-THC).   The FDA recently put out a warning about 5 things that everyone should be aware of regarding Delta-8 THC: Delta-8 THC products have not been evaluated or approved by the FDA for safe use and may be marketed in ways that put the public health at risk. The FDA has received adverse event reports involving delta-8 THC-containing products. Delta-8 THC has psychoactive and intoxicating effects. Delta-8 THC manufacturing often involve use of potentially harmful chemicals to create the concentrations of delta-8 THC claimed in the marketplace. The final delta-8 THC product may have potentially harmful  by-products (contaminants) due to the chemicals used in the process. Manufacturing of delta-8 THC products may occur in uncontrolled or unsanitary settings, which may lead to the presence of unsafe contaminants or other potentially harmful substances. Delta-8 THC products should be kept out of the reach of children and pets.  NOTE: Because a positive UDS for any illicit substance is a violation of our medication agreement, your opioid analgesics (pain medicine) may be permanently discontinued.  MORE ABOUT CBD  General Information: CBD was discovered in [birth year] and it is a derivative of the cannabis sativa genus plants (Marijuana and Hemp). It is one of the 113 identified substances found in Marijuana. It accounts for up to 40% of the plant's extract. As of 2018, preliminary clinical studies on CBD included research for the treatment of anxiety, movement disorders, and pain. CBD is available and consumed in multiple forms, including inhalation of smoke or vapor, as an aerosol spray, and by mouth. It may be supplied as an oil containing CBD, capsules, dried cannabis, or as a liquid solution. CBD is thought not to be as psychoactive as THC (delta-9-tetrahydrocannabinol - the chemical in marijuana responsible for the "HIGH"). Studies suggest that CBD may interact with different biological target receptors in the body, including cannabinoid and other neurotransmitter receptors. As of 2018 the mechanism of action for its biological effects has not been determined.  Side-effects  Adverse reactions: Dry mouth, diarrhea, decreased appetite, fatigue, drowsiness, malaise, weakness, sleep disturbances, and others.  Drug interactions:  CBD may interact with medications such as blood-thinners. CBD causes drowsiness on its own and it will increase drowsiness caused by other medications, including antihistamines (such as Benadryl), benzodiazepines (Xanax, Ativan, Valium), antipsychotics, antidepressants, opioids, alcohol  and supplements such as kava, melatonin and St. John's Wort.  Other drug interactions: Brivaracetam (Briviact); Caffeine; Carbamazepine (Tegretol); Citalopram (Celexa); Clobazam (Onfi); Eslicarbazepine (Aptiom); Everolimus (Zostress); Lithium; Methadone (Dolophine); Rufinamide (Banzel); Sedative medications (CNS depressants); Sirolimus (Rapamune); Stiripentol (Diacomit); Tacrolimus (Prograf); Tamoxifen ; Soltamox); Topiramate (Topamax); Valproate; Warfarin (Coumadin); Zonisamide. (Last update: 05/17/2022) ____________________________________________________________________________________________   ____________________________________________________________________________________________  Naloxone Nasal Spray  Why am I receiving this medication? [place] STOP ACT requires that all patients taking high dose opioids or at risk of opioids respiratory depression, be prescribed an opioid reversal agent, such as  Naloxone (AKA: Narcan).  What is this medication? NALOXONE (nal OX one) treats opioid overdose, which causes slow or shallow breathing, severe drowsiness, or trouble staying awake. Call emergency services after using this medication. You may need additional treatment. Naloxone works by reversing the effects of opioids. It belongs to a group of medications called opioid blockers.  COMMON BRAND NAME(S): Kloxxado, Narcan  What should I tell my care team before I take this medication? They need to know if you have any of these conditions: Heart disease Substance use disorder An unusual or allergic reaction to naloxone, other medications, foods, dyes, or preservatives Pregnant or trying to get pregnant Breast-feeding  When to use this medication? This medication is to be used for the treatment of respiratory depression (less than 8 breaths per minute) secondary to opioid overdose.   How to use this medication? This medication is for use in the nose. Lay the person on their back.  Support their neck with your hand and allow the head to tilt back before giving the medication. The nasal spray should be given into 1 nostril. After giving the medication, move the person onto their side. Do not remove or test the nasal spray until ready to use. Get emergency medical help right away after giving the first dose of this medication, even if the person wakes up. You should be familiar with how to recognize the signs and symptoms of a narcotic overdose. If more doses are needed, give the additional dose in the other nostril. Talk to your care team about the use of this medication in children. While this medication may be prescribed for children as young as newborns for selected conditions, precautions do apply.  Naloxone Overdosage: If you think you have taken too much of this medicine contact a poison control center or emergency room at once.  NOTE: This medicine is only for you. Do not share this medicine with others.  What if I miss a dose? This does not apply.  What may interact with this medication? This is only used during an emergency. No interactions are expected during emergency use. This list may not describe all possible interactions. Give your health care provider a list of all the medicines, herbs, non-prescription drugs, or dietary supplements you use. Also tell them if you smoke, drink alcohol, or use illegal drugs. Some items may interact with your medicine.  What should I watch for while using this medication? Keep this medication ready for use in the case of an opioid overdose. Make sure that you have the phone number of your care team and local hospital ready. You may need to have additional doses of this medication. Each nasal spray contains a single dose. Some emergencies may require additional doses. After use, bring the treated person to the nearest hospital or call 911. Make sure the treating care team knows that the person has received a dose of this medication.  You will receive additional instructions on what to do during and after use of this medication before an emergency occurs.  What side effects may I notice from receiving this medication? Side effects that you should report to your care team as soon as possible: Allergic reactions--skin rash, itching, hives, swelling of the face, lips, tongue, or throat Side effects that usually do not require medical attention (report these to your care team if they continue or are bothersome): Constipation Dryness or irritation inside the nose Headache Increase in blood pressure Muscle spasms Stuffy nose Toothache This list may not   describe all possible side effects. Call your doctor for medical advice about side effects. You may report side effects to FDA at 1-800-FDA-1088.  Where should I keep my medication? Because this is an emergency medication, you should keep it with you at all times.  Keep out of the reach of children and pets. Store between 20 and 25 degrees C (68 and 77 degrees F). Do not freeze. Throw away any unused medication after the expiration date. Keep in original box until ready to use.  NOTE: This sheet is a summary. It may not cover all possible information. If you have questions about this medicine, talk to your doctor, pharmacist, or health care provider.   2023 Elsevier/Gold Standard (2021-01-30 00:00:00)  ____________________________________________________________________________________________   

## 2022-08-23 ENCOUNTER — Encounter: Payer: Self-pay | Admitting: Pain Medicine

## 2022-08-23 ENCOUNTER — Ambulatory Visit: Payer: Medicare Other | Attending: Pain Medicine | Admitting: Pain Medicine

## 2022-08-23 DIAGNOSIS — G8929 Other chronic pain: Secondary | ICD-10-CM | POA: Diagnosis not present

## 2022-08-23 DIAGNOSIS — G894 Chronic pain syndrome: Secondary | ICD-10-CM | POA: Diagnosis not present

## 2022-08-23 DIAGNOSIS — M5442 Lumbago with sciatica, left side: Secondary | ICD-10-CM | POA: Diagnosis not present

## 2022-08-23 DIAGNOSIS — M5414 Radiculopathy, thoracic region: Secondary | ICD-10-CM

## 2022-08-23 DIAGNOSIS — R1011 Right upper quadrant pain: Secondary | ICD-10-CM

## 2022-08-23 DIAGNOSIS — M546 Pain in thoracic spine: Secondary | ICD-10-CM

## 2022-08-23 DIAGNOSIS — G588 Other specified mononeuropathies: Secondary | ICD-10-CM | POA: Insufficient documentation

## 2022-08-23 DIAGNOSIS — G8912 Acute post-thoracotomy pain: Secondary | ICD-10-CM | POA: Diagnosis not present

## 2022-08-23 DIAGNOSIS — Z79891 Long term (current) use of opiate analgesic: Secondary | ICD-10-CM

## 2022-08-23 DIAGNOSIS — Z79899 Other long term (current) drug therapy: Secondary | ICD-10-CM

## 2022-08-23 DIAGNOSIS — R0781 Pleurodynia: Secondary | ICD-10-CM

## 2022-08-23 MED ORDER — OXYCODONE HCL 5 MG PO TABS: 5.0000 mg | ORAL_TABLET | Freq: Three times a day (TID) | ORAL | 0 refills | Status: AC | PRN

## 2022-08-23 NOTE — Progress Notes (Signed)
Nursing Pain Medication Assessment:  Safety precautions to be maintained throughout the outpatient stay will include: orient to surroundings, keep bed in low position, maintain call bell within reach at all times, provide assistance with transfer out of bed and ambulation.  Medication Inspection Compliance: Pill count conducted under aseptic conditions, in front of the patient. Neither the pills nor the bottle was removed from the patient's sight at any time. Once count was completed pills were immediately returned to the patient in their original bottle.  Medication: Oxycodone IR Pill/Patch Count:  53 of 90 pills remain Pill/Patch Appearance: Markings consistent with prescribed medication Bottle Appearance: Standard pharmacy container. Clearly labeled. Filled Date: 03 / 04 / 2024 Last Medication intake:  Today

## 2022-10-06 ENCOUNTER — Other Ambulatory Visit: Payer: Self-pay | Admitting: Oncology

## 2022-10-07 ENCOUNTER — Ambulatory Visit: Payer: Medicare Other | Admitting: Nurse Practitioner

## 2022-10-21 DIAGNOSIS — Z79891 Long term (current) use of opiate analgesic: Secondary | ICD-10-CM | POA: Diagnosis not present

## 2022-10-21 DIAGNOSIS — G580 Intercostal neuropathy: Secondary | ICD-10-CM | POA: Diagnosis not present

## 2022-10-21 DIAGNOSIS — G8929 Other chronic pain: Secondary | ICD-10-CM | POA: Diagnosis not present

## 2022-10-21 DIAGNOSIS — G8922 Chronic post-thoracotomy pain: Secondary | ICD-10-CM | POA: Diagnosis not present

## 2022-12-15 ENCOUNTER — Inpatient Hospital Stay: Payer: Medicare Other | Admitting: Oncology

## 2022-12-15 ENCOUNTER — Inpatient Hospital Stay: Payer: Medicare Other | Attending: Oncology

## 2023-01-05 ENCOUNTER — Other Ambulatory Visit: Payer: Self-pay | Admitting: Oncology

## 2023-01-05 DIAGNOSIS — G8922 Chronic post-thoracotomy pain: Secondary | ICD-10-CM | POA: Diagnosis not present

## 2023-01-05 DIAGNOSIS — G8929 Other chronic pain: Secondary | ICD-10-CM | POA: Diagnosis not present

## 2023-01-05 DIAGNOSIS — Z79891 Long term (current) use of opiate analgesic: Secondary | ICD-10-CM | POA: Diagnosis not present

## 2023-01-05 DIAGNOSIS — G580 Intercostal neuropathy: Secondary | ICD-10-CM | POA: Diagnosis not present

## 2023-01-06 ENCOUNTER — Telehealth: Payer: Self-pay | Admitting: *Deleted

## 2023-01-06 NOTE — Telephone Encounter (Signed)
We got a refill for eliquis. . The pt. Was seen July 2023 and he was doing good on the med. Dr Smith Robert says he will need to continue on that  med. Would see him in 1 year, if he was doing well on the eliquis then he could have the meds filled by PCP. We made him an  appt 12/15/22, he was a no show. I assume that means that he will get refill from PCP but the rx was sent to our office.. I called the pt. No answer on home phone, called cell and left message for him to call us., we need to know if  he wants to be seen again or that eliquis we will refill or he needs to get med from PCP. Waiting for call back

## 2023-01-11 ENCOUNTER — Other Ambulatory Visit: Payer: Self-pay | Admitting: *Deleted

## 2023-01-11 ENCOUNTER — Telehealth: Payer: Self-pay | Admitting: *Deleted

## 2023-01-11 NOTE — Telephone Encounter (Signed)
Called patient to let him know that he did not come to his up visit for Dr. Smith Robert.  The patient says that he is out of his medicine and needs a refill.  He is now moved to [facility].  I told him that if he cannot come in and be seen once a year and then Dr. Smith Robert will not give him his medicine in the future.  Patient is okay to get a 1 year visit each year.  We will be putting in 1 month supply of Eliquis and after that he will be sent a new appointment date and time and after that if he comes then we will give him for the rest of the year Eliquis.  Patient is agreeable for that

## 2023-03-24 ENCOUNTER — Other Ambulatory Visit: Payer: Self-pay | Admitting: *Deleted

## 2023-03-24 DIAGNOSIS — I82409 Acute embolism and thrombosis of unspecified deep veins of unspecified lower extremity: Secondary | ICD-10-CM

## 2023-03-25 ENCOUNTER — Other Ambulatory Visit: Payer: Self-pay

## 2023-03-25 ENCOUNTER — Encounter: Payer: Self-pay | Admitting: Oncology

## 2023-03-25 ENCOUNTER — Inpatient Hospital Stay: Payer: Medicare Other | Attending: Oncology

## 2023-03-25 ENCOUNTER — Inpatient Hospital Stay (HOSPITAL_BASED_OUTPATIENT_CLINIC_OR_DEPARTMENT_OTHER): Payer: Medicare Other | Admitting: Oncology

## 2023-03-25 VITALS — BP 133/69 | HR 64 | Temp 96.1°F | Resp 18 | Ht 72.0 in | Wt 243.1 lb

## 2023-03-25 DIAGNOSIS — Z86718 Personal history of other venous thrombosis and embolism: Secondary | ICD-10-CM

## 2023-03-25 DIAGNOSIS — Z86711 Personal history of pulmonary embolism: Secondary | ICD-10-CM | POA: Diagnosis not present

## 2023-03-25 DIAGNOSIS — Z7901 Long term (current) use of anticoagulants: Secondary | ICD-10-CM

## 2023-03-25 DIAGNOSIS — I82409 Acute embolism and thrombosis of unspecified deep veins of unspecified lower extremity: Secondary | ICD-10-CM

## 2023-03-25 LAB — CBC WITH DIFFERENTIAL (CANCER CENTER ONLY)
Abs Immature Granulocytes: 0.04 10*3/uL (ref 0.00–0.07)
Basophils Absolute: 0.1 10*3/uL (ref 0.0–0.1)
Basophils Relative: 1 %
Eosinophils Absolute: 0.3 10*3/uL (ref 0.0–0.5)
Eosinophils Relative: 3 %
HCT: 46.7 % (ref 39.0–52.0)
Hemoglobin: 15.3 g/dL (ref 13.0–17.0)
Immature Granulocytes: 0 %
Lymphocytes Relative: 27 %
Lymphs Abs: 2.6 10*3/uL (ref 0.7–4.0)
MCH: 30.8 pg (ref 26.0–34.0)
MCHC: 32.8 g/dL (ref 30.0–36.0)
MCV: 94 fL (ref 80.0–100.0)
Monocytes Absolute: 0.9 10*3/uL (ref 0.1–1.0)
Monocytes Relative: 9 %
Neutro Abs: 5.7 10*3/uL (ref 1.7–7.7)
Neutrophils Relative %: 60 %
Platelet Count: 248 10*3/uL (ref 150–400)
RBC: 4.97 MIL/uL (ref 4.22–5.81)
RDW: 12.3 % (ref 11.5–15.5)
Smear Review: NORMAL
WBC Count: 9.6 10*3/uL (ref 4.0–10.5)
nRBC: 0 % (ref 0.0–0.2)

## 2023-03-25 LAB — CMP (CANCER CENTER ONLY)
ALT: 19 U/L (ref 0–44)
AST: 21 U/L (ref 15–41)
Albumin: 4 g/dL (ref 3.5–5.0)
Alkaline Phosphatase: 89 U/L (ref 38–126)
Anion gap: 6 (ref 5–15)
BUN: 12 mg/dL (ref 6–20)
CO2: 26 mmol/L (ref 22–32)
Calcium: 9 mg/dL (ref 8.9–10.3)
Chloride: 105 mmol/L (ref 98–111)
Creatinine: 1.21 mg/dL (ref 0.61–1.24)
GFR, Estimated: >60 mL/min (ref >60)
Glucose, Bld: 103 mg/dL — ABNORMAL HIGH (ref 70–99)
Potassium: 4 mmol/L (ref 3.5–5.1)
Sodium: 137 mmol/L (ref 135–145)
Total Bilirubin: 0.8 mg/dL (ref 0.3–1.2)
Total Protein: 7.5 g/dL (ref 6.5–8.1)

## 2023-03-25 MED ORDER — APIXABAN 5 MG PO TABS: 5.0000 mg | ORAL_TABLET | Freq: Two times a day (BID) | ORAL | 3 refills | Status: DC

## 2023-03-25 NOTE — Progress Notes (Signed)
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 male with recurrent thrombosis x 4.  He developed a right upper extremity clot in 2010. He underwent thrombolysis.  He was on Coumadin x 6 months.  He developed a recurrent clot in the right upper extremity in 2011.  He underwent thoracic outlet decompression.  He was on Coumadin for 6-9 months.  He developed a right lower extremity DVT in 2016.  He was started on Xarelto and subsequently switched to eliquis   He developed a left upper extremity DVT on 06/07/2016 while on Xarelto.  Duplex revealed a near occlusive DVT in the lateral aspect of the left subclavian vein and nonocclusive thrombus extending into the adjacent axillary vein.  He was started on Lovenox.   Hypercoagulable work-up was otherwise negative.  Lupus anticoagulant positive likely because he was on anticoagulation and therefore cannot be interpreted accurately.    Interval history-tolerating Eliquis well without any bleeding issues.  He has trouble sleeping at night.  He also has chronic back pain from prior surgery.  ECOG PS- 1 Pain scale- 0   Review of systems- Review of Systems  Constitutional:  Positive for malaise/fatigue. Negative for chills, fever and weight loss.  HENT:  Negative for congestion, ear discharge and nosebleeds.   Eyes:  Negative for blurred vision.  Respiratory:  Negative for cough, hemoptysis, sputum production, shortness of breath and  wheezing.   Cardiovascular:  Negative for chest pain, palpitations, orthopnea and claudication.  Gastrointestinal:  Negative for abdominal pain, blood in stool, constipation, diarrhea, heartburn, melena, nausea and vomiting.  Genitourinary:  Negative for dysuria, flank pain, frequency, hematuria and urgency.  Musculoskeletal:  Positive for back pain. Negative for joint pain and myalgias.  Skin:  Negative for rash.  Neurological:  Negative for dizziness, tingling, focal weakness, seizures, weakness and headaches.  Endo/Heme/Allergies:  Does not bruise/bleed easily.  Psychiatric/Behavioral:  Negative for depression and suicidal ideas. The patient has insomnia.       No Known Allergies   Past Medical History:  Diagnosis Date   Arm vein blood clot    right   Coronary artery disease    DVT (deep venous thrombosis) (HCC) 2012; January 2016   History of right arm; now right lower shotty   GERD (gastroesophageal reflux disease)    Rib pain on right side 08/04/2016   RT rib pain that radiates around to the back. Level 8 of 10.  Constant. Started 1 mo ago, 1 week after DVT.   Vascular thoracic outlet syndrome    Right      Past Surgical History:  Procedure Laterality Date   CAROTID ANGIOGRAM     COLONOSCOPY WITH PROPOFOL N/A 10/10/2019   Procedure: COLONOSCOPY WITH PROPOFOL;  Surgeon: Wyline Mood, MD;  Location: [facility] ENDOSCOPY;  Service: Gastroenterology;  Laterality: N/A;   KNEE CARTILAGE SURGERY     Performed for cracked patella and torn meniscus   Right upper extremity thrombectomy  2012   SCALENOTOMY W/O RESECTION CERVICAL RIB  2012   4 right-sided thoracic outlet syndrome    Social History   Socioeconomic History   Marital status: Married    Spouse name: Not on file   Number of children: Not on file   Years of education: Not on file   Highest education level: Not on file  Occupational History    Comment: [employer]  Tobacco Use   Smoking status: Former    Current packs/day:  0.50    Types: Cigarettes   Smokeless tobacco: Never   Tobacco comments:    quit smoking in 2010  Vaping Use   Vaping status: Never Used  Substance and Sexual Activity   Alcohol use: No   Drug use: No   Sexual activity: Yes  Other Topics Concern   Not on file  Social History Narrative   He works full-time at [employer].    He is married. Nonsmoker who is not currently. Alcohol.   Social Determinants of Health   Financial Resource Strain: Low Risk  (02/23/2022)   Overall Financial Resource Strain (CARDIA)    Difficulty of Paying Living Expenses: Not hard at all  Food Insecurity: No Food Insecurity (02/23/2022)   Hunger Vital Sign    Worried About Running Out of Food in the Last Year: Never true    Ran Out of Food in the Last Year: Never true  Transportation Needs: No Transportation Needs (02/23/2022)   PRAPARE - Administrator, Civil Service (Medical): No    Lack of Transportation (Non-Medical): No  Physical Activity: Inactive (02/23/2022)   Exercise Vital Sign    Days of Exercise per Week: 0 days    Minutes of Exercise per Session: 0 min  Stress: No Stress Concern Present (02/23/2022)   [employer] of Occupational Health - Occupational Stress Questionnaire    Feeling of Stress : Only a little  Social Connections: Moderately Integrated (02/23/2022)   Social Connection and Isolation Panel [NHANES]    Frequency of Communication with Friends and Family: Twice a week    Frequency of Social Gatherings with Friends and Family: Twice a week    Attends Religious Services: More than 4 times per year    Active Member of [employer] or Organizations: No    Attends Banker Meetings: Never    Marital Status: Married  Catering manager Violence: Not At Risk (02/23/2022)   Humiliation, Afraid, Rape, and Kick questionnaire    Fear of Current or Ex-Partner: No    Emotionally Abused: No    Physically Abused: No    Sexually Abused: No    Family History  Problem Relation  Age of Onset   Heart disease Father    Breast cancer Father 67   Diabetes Father    Heart attack Father    Cancer Maternal Grandmother    Leukemia Maternal Grandfather    Prostate cancer Maternal Uncle      Current Outpatient Medications:    albuterol (VENTOLIN HFA) 108 (90 Base) MCG/ACT inhaler, TAKE 2 PUFFS BY MOUTH EVERY 6 HOURS AS NEEDED FOR WHEEZE OR SHORTNESS OF BREATH, Disp: 8.5 each, Rfl: 1   apixaban (ELIQUIS) 5 MG TABS tablet, Take 1 tablet (5 mg total) by mouth 2 (two) times daily., Disp: 60 tablet, Rfl: 0   atorvastatin (LIPITOR) 80 MG tablet, Take 1 tablet (80 mg total) by mouth daily., Disp: 90 tablet, Rfl: 1   Calcium Carbonate-Vit D-Min (GNP CALCIUM 1200) 1200-1000 MG-UNIT CHEW, Chew 1,200 mg by mouth daily with breakfast. Take in  combination with vitamin D and magnesium., Disp: 30 tablet, Rfl: 2   ibuprofen (ADVIL) 800 MG tablet, Take 1 tablet (800 mg total) by mouth every 8 (eight) hours as needed for mild pain., Disp: 30 tablet, Rfl: 0   oxyCODONE (OXY IR/ROXICODONE) 5 MG immediate release tablet, Take 1 tablet (5 mg total) by mouth every 8 (eight) hours as needed for severe pain. Must last 30 days, Disp: 90 tablet, Rfl: 0   naloxone (NARCAN) nasal spray 4 mg/0.1 mL, Place 1 spray into the nose as needed for up to 365 doses (for opioid-induced respiratory depresssion). In case of emergency (overdose), spray once into each nostril. If no response within 3 minutes, repeat application and call 911., Disp: 1 each, Rfl: 0   oxyCODONE (OXY IR/ROXICODONE) 5 MG immediate release tablet, Take 1 tablet (5 mg total) by mouth every 8 (eight) hours as needed for severe pain. Must last 30 days, Disp: 90 tablet, Rfl: 0   oxyCODONE (OXY IR/ROXICODONE) 5 MG immediate release tablet, Take 1 tablet (5 mg total) by mouth every 8 (eight) hours as needed for severe pain. Must last 30 days, Disp: 90 tablet, Rfl: 0  Physical exam:  Vitals:   03/25/23 1457  BP: 133/69  Pulse: 64  Resp: 18   Temp: (!) 96.1 F (35.6 C)  TempSrc: Tympanic  SpO2: 100%  Weight: 243 lb 1.6 oz (110.3 kg)  Height: 6' (1.829 m)   Physical Exam Cardiovascular:     Rate and Rhythm: Normal rate and regular rhythm.     Heart sounds: Normal heart sounds.  Pulmonary:     Effort: Pulmonary effort is normal.  Skin:    General: Skin is warm and dry.  Neurological:     Mental Status: He is alert and oriented to person, place, and time.         Latest Ref Rng & Units 03/25/2023    1:55 PM  CMP  Glucose 70 - 99 mg/dL 161   BUN 6 - 20 mg/dL 12   Creatinine 0.96 - 1.24 mg/dL 0.45   Sodium 409 - 811 mmol/L 137   Potassium 3.5 - 5.1 mmol/L 4.0   Chloride 98 - 111 mmol/L 105   CO2 22 - 32 mmol/L 26   Calcium 8.9 - 10.3 mg/dL 9.0   Total Protein 6.5 - 8.1 g/dL 7.5   Total Bilirubin 0.3 - 1.2 mg/dL 0.8   Alkaline Phos 38 - 126 U/L 89   AST 15 - 41 U/L 21   ALT 0 - 44 U/L 19       Latest Ref Rng & Units 03/25/2023    1:55 PM  CBC  WBC 4.0 - 10.5 K/uL 9.6   Hemoglobin 13.0 - 17.0 g/dL 91.4   Hematocrit 78.2 - 52.0 % 46.7   Platelets 150 - 400 K/uL 248     Assessment and plan- Patient is a 57 y.o. male with history of recurrent DVT currently on Eliquis here for routine follow-up  Patient is tolerating Eliquis well without any significant side effects.  Labs are within normal limits.  He is able to afford Eliquis at this time and has failed other anticoagulants in the past.  I will continue to see him on a yearly basis.  I am refilling Eliquis prescription today   Visit Diagnosis 1. Long term current use of anticoagulant therapy   2. History of recurrent deep vein thrombosis (DVT)      Dr. Owens Shark, MD, MPH [facility] at  [facility] [phone] 03/25/2023 3:25 PM

## 2023-04-22 ENCOUNTER — Telehealth: Payer: Self-pay | Admitting: *Deleted

## 2023-04-22 NOTE — Telephone Encounter (Signed)
Patient called stating that he only got a 45 day supply of his Eliquis and had to pay for 90 day supply and is asking for a return call to discuss this

## 2023-04-23 ENCOUNTER — Other Ambulatory Visit: Payer: Self-pay | Admitting: Oncology

## 2023-04-23 MED ORDER — APIXABAN 5 MG PO TABS: 5.0000 mg | ORAL_TABLET | Freq: Two times a day (BID) | ORAL | 3 refills | Status: AC

## 2023-04-23 NOTE — Telephone Encounter (Signed)
I have sent a new script for 90 day supply. Please let him know

## 2023-04-25 NOTE — Telephone Encounter (Signed)
Call returned to patient and I got voice mail, left message that a 90 day supply has been sent

## 2023-05-03 DIAGNOSIS — G580 Intercostal neuropathy: Secondary | ICD-10-CM | POA: Diagnosis not present

## 2023-05-03 DIAGNOSIS — G8922 Chronic post-thoracotomy pain: Secondary | ICD-10-CM | POA: Diagnosis not present

## 2023-05-03 DIAGNOSIS — G8929 Other chronic pain: Secondary | ICD-10-CM | POA: Diagnosis not present

## 2023-05-03 DIAGNOSIS — Z79891 Long term (current) use of opiate analgesic: Secondary | ICD-10-CM | POA: Diagnosis not present

## 2023-05-04 DIAGNOSIS — Z79891 Long term (current) use of opiate analgesic: Secondary | ICD-10-CM | POA: Diagnosis not present

## 2023-05-19 ENCOUNTER — Telehealth: Payer: Self-pay | Admitting: *Deleted

## 2023-05-19 NOTE — Telephone Encounter (Signed)
Patient called reporting that he went to get prescription for Eliquis and the price has gone up real high and is asking for a return call to discuss this. Please return his call

## 2023-05-20 NOTE — Telephone Encounter (Signed)
This patient has failed xarelto and coumadin. Can you call him about eliquis and see what is the issue? Do we have any assistance available?

## 2023-05-26 DIAGNOSIS — M549 Dorsalgia, unspecified: Secondary | ICD-10-CM | POA: Diagnosis not present

## 2023-05-26 DIAGNOSIS — Z86718 Personal history of other venous thrombosis and embolism: Secondary | ICD-10-CM | POA: Diagnosis not present

## 2023-05-26 DIAGNOSIS — E782 Mixed hyperlipidemia: Secondary | ICD-10-CM | POA: Diagnosis not present

## 2023-05-26 DIAGNOSIS — G8929 Other chronic pain: Secondary | ICD-10-CM | POA: Diagnosis not present

## 2023-06-07 DIAGNOSIS — E782 Mixed hyperlipidemia: Secondary | ICD-10-CM | POA: Diagnosis not present

## 2023-06-07 DIAGNOSIS — R7303 Prediabetes: Secondary | ICD-10-CM | POA: Diagnosis not present

## 2023-06-09 ENCOUNTER — Telehealth: Payer: Self-pay | Admitting: *Deleted

## 2023-06-09 NOTE — Telephone Encounter (Signed)
 Patient called and states that his Eliquis is now going to cost $560 and that there is no way that he can afford that. Please advise

## 2023-06-10 NOTE — Telephone Encounter (Signed)
 I spoke with [name], CMA regarding patient assistance form and she will assist patient in getting the form to see if he qualifies for assistance

## 2023-06-21 ENCOUNTER — Telehealth: Payer: Self-pay | Admitting: *Deleted

## 2023-06-21 NOTE — Telephone Encounter (Signed)
 I got a message that the forms did not come through to his email. I sent another one today and hope he gets it this time. From my side the email went through

## 2023-07-11 ENCOUNTER — Telehealth: Payer: Self-pay | Admitting: *Deleted

## 2023-07-11 NOTE — Telephone Encounter (Signed)
If he cannot afford eliquis- can you ask him if he is willing to try coumadin? He will need weekly lab check initially for 1st few months and eventually monthly

## 2023-07-11 NOTE — Telephone Encounter (Signed)
Patient called and said he never heard whether or not he was getting any assistance with that [employer] assistance foundation for Eliquis.  I asked him maybe called to see what they said he said that he sent the message and did not get a number so I gave him the #[phone].  I asked him to go ahead and call and check in and he can call us back in the morning otherwise he says he is coughing 4-500 for every 3 months and she he cannot afford it and so he wants to know if he needs to go back on the injections which was much less cost with the injections from financial part.

## 2023-07-12 ENCOUNTER — Telehealth: Payer: Self-pay | Admitting: *Deleted

## 2023-07-12 ENCOUNTER — Other Ambulatory Visit: Payer: Self-pay

## 2023-07-12 NOTE — Telephone Encounter (Signed)
 I called the pt already and he did not know if he got assistanceor not. I told him that he can call. He had the whole papers because [place]  from here and he was going to sign the paper and fax it and wife was going to do it. I told him yest that she can call and see what they said. He says that need do the papers again and then see what to do after that. \He is on last eliquis  and he wants to go back to injections because they are cheaper until if he gets any assistance with eliquis 

## 2023-07-12 NOTE — Telephone Encounter (Addendum)
 1. He can stay on lovenox  injections once daily [place] 1.5 mg/kg until we can get eliquis  assistance sorted out.  2. If assistance  does not go through, I will need to see him to discuss alternatives sooner. I called the pt back and he will start 150 mg inj. daily,and the form was sent again today. Pt is good with the injections for now.

## 2023-11-14 DIAGNOSIS — M549 Dorsalgia, unspecified: Secondary | ICD-10-CM | POA: Diagnosis not present

## 2023-11-22 DIAGNOSIS — R109 Unspecified abdominal pain: Secondary | ICD-10-CM | POA: Diagnosis not present

## 2023-11-22 DIAGNOSIS — R071 Chest pain on breathing: Secondary | ICD-10-CM | POA: Diagnosis not present

## 2023-11-24 DIAGNOSIS — R0781 Pleurodynia: Secondary | ICD-10-CM | POA: Diagnosis not present

## 2023-11-24 DIAGNOSIS — Z86018 Personal history of other benign neoplasm: Secondary | ICD-10-CM | POA: Diagnosis not present

## 2023-11-24 DIAGNOSIS — M546 Pain in thoracic spine: Secondary | ICD-10-CM | POA: Diagnosis not present

## 2023-11-24 DIAGNOSIS — R0609 Other forms of dyspnea: Secondary | ICD-10-CM | POA: Diagnosis not present

## 2023-11-29 DIAGNOSIS — S29019A Strain of muscle and tendon of unspecified wall of thorax, initial encounter: Secondary | ICD-10-CM | POA: Diagnosis not present

## 2023-11-29 DIAGNOSIS — Z7901 Long term (current) use of anticoagulants: Secondary | ICD-10-CM | POA: Diagnosis not present

## 2023-11-29 DIAGNOSIS — R058 Other specified cough: Secondary | ICD-10-CM | POA: Diagnosis not present

## 2023-11-29 DIAGNOSIS — R0789 Other chest pain: Secondary | ICD-10-CM | POA: Diagnosis not present

## 2023-11-29 DIAGNOSIS — S29011A Strain of muscle and tendon of front wall of thorax, initial encounter: Secondary | ICD-10-CM | POA: Diagnosis not present

## 2023-11-29 DIAGNOSIS — X58XXXA Exposure to other specified factors, initial encounter: Secondary | ICD-10-CM | POA: Diagnosis not present

## 2023-11-29 DIAGNOSIS — Z87891 Personal history of nicotine dependence: Secondary | ICD-10-CM | POA: Diagnosis not present

## 2023-11-29 DIAGNOSIS — Z86711 Personal history of pulmonary embolism: Secondary | ICD-10-CM | POA: Diagnosis not present

## 2023-11-29 DIAGNOSIS — R109 Unspecified abdominal pain: Secondary | ICD-10-CM | POA: Diagnosis not present

## 2023-11-29 DIAGNOSIS — R079 Chest pain, unspecified: Secondary | ICD-10-CM | POA: Diagnosis not present

## 2024-01-26 DIAGNOSIS — R051 Acute cough: Secondary | ICD-10-CM | POA: Diagnosis not present

## 2024-02-02 DIAGNOSIS — R918 Other nonspecific abnormal finding of lung field: Secondary | ICD-10-CM | POA: Diagnosis not present

## 2024-02-02 DIAGNOSIS — M5124 Other intervertebral disc displacement, thoracic region: Secondary | ICD-10-CM | POA: Diagnosis not present

## 2024-02-02 DIAGNOSIS — N289 Disorder of kidney and ureter, unspecified: Secondary | ICD-10-CM | POA: Diagnosis not present

## 2024-02-02 DIAGNOSIS — M5030 Other cervical disc degeneration, unspecified cervical region: Secondary | ICD-10-CM | POA: Diagnosis not present

## 2024-02-02 DIAGNOSIS — R0781 Pleurodynia: Secondary | ICD-10-CM | POA: Diagnosis not present

## 2024-03-23 ENCOUNTER — Inpatient Hospital Stay: Payer: Medicare Other | Admitting: Oncology

## 2024-03-23 ENCOUNTER — Encounter: Payer: Self-pay | Admitting: Oncology

## 2024-03-23 ENCOUNTER — Inpatient Hospital Stay: Payer: Medicare Other
